# Patient Record
Sex: Female | Born: 1980 | Race: White | Hispanic: No | Marital: Single | Smoking: Former smoker
Health system: Southern US, Community
[De-identification: ages and names within clinical notes are randomized; demographics above are authoritative.]

## PROBLEM LIST (undated history)

## (undated) DIAGNOSIS — K219 Gastro-esophageal reflux disease without esophagitis: Secondary | ICD-10-CM

## (undated) DIAGNOSIS — E1010 Type 1 diabetes mellitus with ketoacidosis without coma: Secondary | ICD-10-CM

## (undated) DIAGNOSIS — E119 Type 2 diabetes mellitus without complications: Secondary | ICD-10-CM

## (undated) DIAGNOSIS — N179 Acute kidney failure, unspecified: Secondary | ICD-10-CM

## (undated) DIAGNOSIS — J9601 Acute respiratory failure with hypoxia: Secondary | ICD-10-CM

## (undated) DIAGNOSIS — F419 Anxiety disorder, unspecified: Secondary | ICD-10-CM

## (undated) DIAGNOSIS — I219 Acute myocardial infarction, unspecified: Secondary | ICD-10-CM

## (undated) DIAGNOSIS — E8729 Other acidosis: Secondary | ICD-10-CM

## (undated) DIAGNOSIS — K148 Other diseases of tongue: Secondary | ICD-10-CM

## (undated) DIAGNOSIS — E875 Hyperkalemia: Secondary | ICD-10-CM

## (undated) DIAGNOSIS — G43909 Migraine, unspecified, not intractable, without status migrainosus: Secondary | ICD-10-CM

## (undated) DIAGNOSIS — Z91199 Patient's noncompliance with other medical treatment and regimen due to unspecified reason: Secondary | ICD-10-CM

## (undated) DIAGNOSIS — R51 Headache: Secondary | ICD-10-CM

## (undated) DIAGNOSIS — F32A Depression, unspecified: Secondary | ICD-10-CM

## (undated) DIAGNOSIS — E860 Dehydration: Secondary | ICD-10-CM

## (undated) DIAGNOSIS — R000 Tachycardia, unspecified: Secondary | ICD-10-CM

## (undated) DIAGNOSIS — G4700 Insomnia, unspecified: Secondary | ICD-10-CM

## (undated) DIAGNOSIS — E440 Moderate protein-calorie malnutrition: Secondary | ICD-10-CM

## (undated) DIAGNOSIS — I959 Hypotension, unspecified: Secondary | ICD-10-CM

## (undated) HISTORY — PX: NO PAST SURGERIES: SHX2092

---

## 2002-01-03 ENCOUNTER — Encounter: Payer: Self-pay | Admitting: Emergency Medicine

## 2002-01-03 ENCOUNTER — Emergency Department (HOSPITAL_COMMUNITY): Admission: EM | Admit: 2002-01-03 | Discharge: 2002-01-03 | Payer: Self-pay | Admitting: Emergency Medicine

## 2003-01-01 ENCOUNTER — Emergency Department (HOSPITAL_COMMUNITY): Admission: EM | Admit: 2003-01-01 | Discharge: 2003-01-01 | Payer: Self-pay | Admitting: Emergency Medicine

## 2003-01-24 ENCOUNTER — Emergency Department (HOSPITAL_COMMUNITY): Admission: EM | Admit: 2003-01-24 | Discharge: 2003-01-24 | Payer: Self-pay | Admitting: Emergency Medicine

## 2003-01-25 ENCOUNTER — Encounter: Payer: Self-pay | Admitting: Emergency Medicine

## 2003-05-01 ENCOUNTER — Ambulatory Visit (HOSPITAL_COMMUNITY): Admission: RE | Admit: 2003-05-01 | Discharge: 2003-05-01 | Payer: Self-pay

## 2003-06-26 ENCOUNTER — Encounter: Payer: Self-pay | Admitting: Obstetrics and Gynecology

## 2003-06-26 ENCOUNTER — Ambulatory Visit (HOSPITAL_COMMUNITY): Admission: RE | Admit: 2003-06-26 | Discharge: 2003-06-26 | Payer: Self-pay | Admitting: Obstetrics and Gynecology

## 2003-07-24 ENCOUNTER — Inpatient Hospital Stay (HOSPITAL_COMMUNITY): Admission: AD | Admit: 2003-07-24 | Discharge: 2003-07-24 | Payer: Self-pay | Admitting: Obstetrics and Gynecology

## 2003-09-03 ENCOUNTER — Inpatient Hospital Stay (HOSPITAL_COMMUNITY): Admission: AD | Admit: 2003-09-03 | Discharge: 2003-09-03 | Payer: Self-pay | Admitting: Obstetrics and Gynecology

## 2003-09-11 ENCOUNTER — Inpatient Hospital Stay (HOSPITAL_COMMUNITY): Admission: AD | Admit: 2003-09-11 | Discharge: 2003-09-11 | Payer: Self-pay | Admitting: Obstetrics and Gynecology

## 2003-09-27 ENCOUNTER — Inpatient Hospital Stay (HOSPITAL_COMMUNITY): Admission: AD | Admit: 2003-09-27 | Discharge: 2003-09-29 | Payer: Self-pay | Admitting: Obstetrics and Gynecology

## 2011-08-24 ENCOUNTER — Emergency Department (HOSPITAL_COMMUNITY)
Admission: EM | Admit: 2011-08-24 | Discharge: 2011-08-25 | Disposition: A | Discharge status: Home / Self Care | Payer: Self-pay | Attending: Emergency Medicine | Admitting: Emergency Medicine

## 2011-08-24 ENCOUNTER — Emergency Department (HOSPITAL_COMMUNITY): Payer: Self-pay

## 2011-08-24 ENCOUNTER — Encounter: Payer: Self-pay | Admitting: Emergency Medicine

## 2011-08-24 DIAGNOSIS — S139XXA Sprain of joints and ligaments of unspecified parts of neck, initial encounter: Secondary | ICD-10-CM | POA: Insufficient documentation

## 2011-08-24 DIAGNOSIS — S161XXA Strain of muscle, fascia and tendon at neck level, initial encounter: Secondary | ICD-10-CM

## 2011-08-24 DIAGNOSIS — R51 Headache: Secondary | ICD-10-CM | POA: Insufficient documentation

## 2011-08-24 DIAGNOSIS — M542 Cervicalgia: Secondary | ICD-10-CM | POA: Insufficient documentation

## 2011-08-24 DIAGNOSIS — F17200 Nicotine dependence, unspecified, uncomplicated: Secondary | ICD-10-CM | POA: Insufficient documentation

## 2011-08-24 MED ORDER — HYDROCODONE-ACETAMINOPHEN 5-325 MG PO TABS: 1.0000 | ORAL_TABLET | ORAL | Status: AC | PRN

## 2011-08-24 MED ORDER — IBUPROFEN 800 MG PO TABS
800.0000 mg | ORAL_TABLET | Freq: Once | ORAL | Status: AC
  Administered 2011-08-24: 800 mg via ORAL
  Filled 2011-08-24: qty 1

## 2011-08-24 MED ORDER — IBUPROFEN 400 MG PO TABS: 400.0000 mg | ORAL_TABLET | Freq: Four times a day (QID) | ORAL | Status: AC | PRN

## 2011-08-24 MED ORDER — DIAZEPAM 5 MG PO TABS
5.0000 mg | ORAL_TABLET | Freq: Once | ORAL | Status: AC
  Administered 2011-08-24: 5 mg via ORAL
  Filled 2011-08-24: qty 1

## 2011-08-24 MED ORDER — CYCLOBENZAPRINE HCL 10 MG PO TABS: 10.0000 mg | ORAL_TABLET | Freq: Three times a day (TID) | ORAL | Status: AC | PRN

## 2011-08-24 MED ORDER — HYDROCODONE-ACETAMINOPHEN 5-325 MG PO TABS
1.0000 | ORAL_TABLET | Freq: Once | ORAL | Status: AC
  Administered 2011-08-24: 1 via ORAL
  Filled 2011-08-24: qty 1
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
## 2011-08-24 NOTE — ED Notes (Signed)
Family at bedside. 

## 2011-08-24 NOTE — ED Notes (Signed)
Patient is resting comfortably. 

## 2011-08-25 NOTE — ED Provider Notes (Signed)
Medical screening examination/treatment/procedure(s) were performed by non-physician practitioner and as supervising physician I was immediately available for consultation/collaboration.  Shelda Jakes, MD 08/25/11 [phone]

## 2011-11-22 ENCOUNTER — Emergency Department (HOSPITAL_COMMUNITY)
Admission: EM | Admit: 2011-11-22 | Discharge: 2011-11-22 | Disposition: A | Discharge status: Home / Self Care | Payer: Self-pay | Attending: Emergency Medicine | Admitting: Emergency Medicine

## 2011-11-22 ENCOUNTER — Emergency Department (HOSPITAL_COMMUNITY): Payer: Self-pay

## 2011-11-22 ENCOUNTER — Encounter (HOSPITAL_COMMUNITY): Payer: Self-pay | Admitting: Emergency Medicine

## 2011-11-22 ENCOUNTER — Other Ambulatory Visit: Payer: Self-pay

## 2011-11-22 DIAGNOSIS — R509 Fever, unspecified: Secondary | ICD-10-CM | POA: Insufficient documentation

## 2011-11-22 DIAGNOSIS — R059 Cough, unspecified: Secondary | ICD-10-CM | POA: Insufficient documentation

## 2011-11-22 DIAGNOSIS — R0989 Other specified symptoms and signs involving the circulatory and respiratory systems: Secondary | ICD-10-CM | POA: Insufficient documentation

## 2011-11-22 DIAGNOSIS — L989 Disorder of the skin and subcutaneous tissue, unspecified: Secondary | ICD-10-CM | POA: Insufficient documentation

## 2011-11-22 DIAGNOSIS — R0602 Shortness of breath: Secondary | ICD-10-CM | POA: Insufficient documentation

## 2011-11-22 DIAGNOSIS — J189 Pneumonia, unspecified organism: Secondary | ICD-10-CM

## 2011-11-22 DIAGNOSIS — J45909 Unspecified asthma, uncomplicated: Secondary | ICD-10-CM | POA: Insufficient documentation

## 2011-11-22 DIAGNOSIS — IMO0001 Reserved for inherently not codable concepts without codable children: Secondary | ICD-10-CM | POA: Insufficient documentation

## 2011-11-22 DIAGNOSIS — R05 Cough: Secondary | ICD-10-CM | POA: Insufficient documentation

## 2011-11-22 DIAGNOSIS — M549 Dorsalgia, unspecified: Secondary | ICD-10-CM | POA: Insufficient documentation

## 2011-11-22 MED ORDER — DOXYCYCLINE HYCLATE 100 MG PO CAPS: 100.0000 mg | ORAL_CAPSULE | Freq: Two times a day (BID) | ORAL | Status: AC

## 2011-11-22 MED ORDER — HYDROCODONE-HOMATROPINE 5-1.5 MG/5ML PO SYRP: 5.0000 mL | ORAL_SOLUTION | Freq: Four times a day (QID) | ORAL | Status: AC | PRN

## 2011-11-22 NOTE — Discharge Instructions (Signed)
Take medications as prescribed. Followup with your doctor in regards to your hospital visit. If you do not have a doctor use the resource guide listed below to help you find one. You may return to the emergency department if symptoms worsen, become progressive, or become more concerning.  Pneumonia, Adult Pneumonia is an infection of the lungs.  CAUSES Pneumonia may be caused by bacteria or a virus. Usually, these infections are caused by breathing infectious particles into the lungs (respiratory tract). SYMPTOMS   Cough.   Fever.   Chest pain.   Increased rate of breathing.   Wheezing.   Mucus production.  DIAGNOSIS  If you have the common symptoms of pneumonia, your caregiver will typically confirm the diagnosis with a chest X-ray. The X-ray will show an abnormality in the lung (pulmonary infiltrate) if you have pneumonia. Other tests of your blood, urine, or sputum may be done to find the specific cause of your pneumonia. Your caregiver may also do tests (blood gases or pulse oximetry) to see how well your lungs are working. TREATMENT  Some forms of pneumonia may be spread to other people when you cough or sneeze. You may be asked to wear a mask before and during your exam. Pneumonia that is caused by bacteria is treated with antibiotic medicine. Pneumonia that is caused by the influenza virus may be treated with an antiviral medicine. Most other viral infections must run their course. These infections will not respond to antibiotics.  PREVENTION A pneumococcal shot (vaccine) is available to prevent a common bacterial cause of pneumonia. This is usually suggested for:  People over 74 years old.   Patients on chemotherapy.   People with chronic lung problems, such as bronchitis or emphysema.   People with immune system problems.  If you are over 65 or have a high risk condition, you may receive the pneumococcal vaccine if you have not received it before. In some countries, a  routine influenza vaccine is also recommended. This vaccine can help prevent some cases of pneumonia.You may be offered the influenza vaccine as part of your care. If you smoke, it is time to quit. You may receive instructions on how to stop smoking. Your caregiver can provide medicines and counseling to help you quit. HOME CARE INSTRUCTIONS   Cough suppressants may be used if you are losing too much rest. However, coughing protects you by clearing your lungs. You should avoid using cough suppressants if you can.   Your caregiver may have prescribed medicine if he or she thinks your pneumonia is caused by a bacteria or influenza. Finish your medicine even if you start to feel better.   Your caregiver may also prescribe an expectorant. This loosens the mucus to be coughed up.   Only take over-the-counter or prescription medicines for pain, discomfort, or fever as directed by your caregiver.   Do not smoke. Smoking is a common cause of bronchitis and can contribute to pneumonia. If you are a smoker and continue to smoke, your cough may last several weeks after your pneumonia has cleared.   A cold steam vaporizer or humidifier in your room or home may help loosen mucus.   Coughing is often worse at night. Sleeping in a semi-upright position in a recliner or using a couple pillows under your head will help with this.   Get rest as you feel it is needed. Your body will usually let you know when you need to rest.  SEEK IMMEDIATE MEDICAL CARE IF:  Your illness becomes worse. This is especially true if you are elderly or weakened from any other disease.   You cannot control your cough with suppressants and are losing sleep.   You begin coughing up blood.   You develop pain which is getting worse or is uncontrolled with medicines.   You have a fever.   Any of the symptoms which initially brought you in for treatment are getting worse rather than better.   You develop shortness of breath or  chest pain.  MAKE SURE YOU:   Understand these instructions.   Will watch your condition.   Will get help right away if you are not doing well or get worse.   RESOURCE GUIDE  Dental Problems  Patients with Medicaid: [facility] [phone] [address]                                           [phone] [address] Phone:  [phone]                                                  Phone:  [phone]  If unable to pay or uninsured, contact:  [facility] or [facility]. to become qualified for the adult dental clinic.  Chronic Pain Problems Contact [facility]  [phone] Patients need to be referred by their primary care doctor.  Insufficient Money for Medicine Contact United Way:  call "211" or [facility] 440-567-5947.  No Primary Care Doctor Call [facility]  [phone] Other agencies that provide inexpensive medical care    [facility]  [phone]    [facility]  [phone]    [facility]  [phone]    [facility]  [phone]    [facility]  [phone]    [facility]  [phone]  Psychological Services [facility]  [phone] [facility]  [phone] [facility]   [phone] (emergency services 6475508053)  Substance Abuse Resources [facility]  [phone] [facility] [phone])025-7274 [facility] [phone] Floydene Flock [phone] Residential & Outpatient Substance Abuse Program  ([phone]  Abuse/Neglect [facility] Child Abuse Hotline [phone] [facility] Child Abuse Hotline 7754354798 (After Hours)  Emergency Shelter [facility] ([phone]  Maternity Homes Room at the [place] [facility] [place] (225)[phone] [facility] [phone]  MRSA Hotline #:   203 394 3385    [facility] Resources  Free  [facility] [place]     United [employer]                          [facility] Dept. [address] [place]                       [address]      [address]  [address]  Sela Hua Phone:  [phone]                                   Phone:  [phone]                 Phone:  [place] Phone:  [place] [phone] (After Hours)

## 2011-11-22 NOTE — ED Provider Notes (Signed)
History     CSN: [id number]  Arrival date & time 11/22/11  1932   First MD Initiated Contact with Patient 11/22/11 2143      Chief Complaint  Patient presents with  . Cough    (Consider location/radiation/quality/duration/timing/severity/associated sxs/prior treatment) HPI Comments: Patient with a history of asthma presents emergency department chief complaint of productive cough, body aches, fever, chest congestion, and back pain.  Patient states she's had these symptoms for 3 weeks.  Patient has some mild shortness of breath after long coughing spells.  Patient states that she currently does not use albuterol and has not had an asthma exacerbation since childhood.  Patient denies wheezing, dyspnea on exertion, chest pain, ear aches, change in vision. NO hx of IV drug use, CA, Steroids, DM , or COPD.   Patient is a 31 y.o. female presenting with cough. The history is provided by the patient.  Cough Associated symptoms include chills, sweats, myalgias and shortness of breath. Pertinent negatives include no chest pain, no weight loss, no ear congestion, no ear pain, no headaches, no rhinorrhea, no sore throat, no wheezing and no eye redness.    Past Medical History  Diagnosis Date  . Asthma     History reviewed. No pertinent past surgical history.  History reviewed. No pertinent family history.  History  Substance Use Topics  . Smoking status: Current Everyday Smoker  . Smokeless tobacco: Not on file  . Alcohol Use: No    OB History    Grav Para Term Preterm Abortions TAB SAB Ect Mult Living                  Review of Systems  Constitutional: Positive for fever and chills. Negative for weight loss.  HENT: Negative for ear pain, congestion, sore throat and rhinorrhea.   Eyes: Negative for redness.  Respiratory: Positive for cough and shortness of breath. Negative for wheezing.   Cardiovascular: Negative for chest pain.  Genitourinary: Negative for dysuria, urgency,  frequency and flank pain.  Musculoskeletal: Positive for myalgias and back pain.  Skin: Negative for color change and rash.  Neurological: Negative for speech difficulty, weakness and headaches.  All other systems reviewed and are negative.    Allergies  Review of patient's allergies indicates no known allergies.  Home Medications   Current Outpatient Rx  Name Route Sig Dispense Refill  . ACETAMINOPHEN 500 MG PO TABS Oral Take 500 mg by mouth every 6 (six) hours as needed. For pain     . IBUPROFEN 200 MG PO TABS Oral Take 400 mg by mouth every 6 (six) hours as needed. For pain     . PSEUDOEPH-DOXYLAMINE-DM-APAP 60-12.02-18-999 MG/30ML PO LIQD Oral Take 30 mLs by mouth every 12 (twelve) hours as needed. For cold/flu symptoms    . PSEUDOEPHEDRINE-APAP-DM 30-865-78 MG/30ML PO LIQD Oral Take 15 mLs by mouth every 12 (twelve) hours as needed. For cold/flu symptoms      BP 117/58  Temp(Src) 99.6 F (37.6 C) (Oral)  Resp 20  SpO2 92%  LMP 11/08/2011  Physical Exam  Nursing note and vitals reviewed. Constitutional: She is oriented to person, place, and time. She appears well-developed and well-nourished. No distress.  HENT:  Head: Normocephalic and atraumatic. No trismus in the jaw.  Right Ear: External ear normal. No drainage or tenderness. No mastoid tenderness.  Left Ear: External ear normal. No drainage or tenderness. No mastoid tenderness.  Nose: Nose normal. No rhinorrhea or sinus tenderness.  Mouth/Throat: Uvula is midline,  oropharynx is clear and moist and mucous membranes are normal. No uvula swelling. No oropharyngeal exudate.  Eyes: Conjunctivae and EOM are normal. Right eye exhibits no discharge. Left eye exhibits no discharge. No scleral icterus.  Neck: Normal range of motion. Neck supple.  Cardiovascular: Normal rate, regular rhythm and normal heart sounds.   Pulmonary/Chest: Effort normal. No stridor. No respiratory distress. She has no wheezes. She has rhonchi in the  right middle field, the right lower field and the left lower field. She exhibits tenderness.         Mild tenderness of breast.  No fluctuance, erythema, or draining.  Abdominal: Soft. There is no tenderness.  Musculoskeletal: Normal range of motion.  Neurological: She is alert and oriented to person, place, and time.  Skin: Skin is warm and dry. No rash noted. She is not diaphoretic.       3 small scabs located on the patient's lower back over her tattoo.  There is no surrounding erythema or draining.  Psychiatric: She has a normal mood and affect. Her behavior is normal.    ED Course  Procedures (including critical care time)  Labs Reviewed - No data to display Dg Chest 2 View  11/22/2011  *RADIOLOGY REPORT*  Clinical Data: Productive cough  CHEST - 2 VIEW  Comparison: None.  Findings: Diffusely increased lung markings.  This is most prominent in the lung bases.  No baseline study is available however the findings are suggestive of bibasilar pneumonia.  There may be underlying chronic lung disease from asthma.  No pleural effusion.  Mild hyperinflation.  IMPRESSION: Prominent lung markings with bibasilar infiltrates compatible with pneumonia.  Question underlying asthma.  Original Report Authenticated By: Camelia Phenes, M.D.     No diagnosis found.    MDM  CAP  Patient diagnosed with community-acquired pneumonia.  No significant comorbidities or immunocompromise.  Patient will be discharged and treated w OP abx. Advised to have a f-u CXR after tx to assure the PNA was treated appropriately.  Patient has been tries to use warm compresses on breast and to return for incision and drainage if abscess forms.  Patient also advised to return to emergency department if fever greater than 101 develops and persists or if she becomes short of breath.      Jaci Carrel, [place] 11/22/11 2239

## 2011-11-22 NOTE — ED Notes (Addendum)
Patient complaining of productive cough, body aches, fever, nausea, chest congestion, vomiting, back pain, and sore rib pain (during coughing) for the past three weeks.  Patient denies shortness of breath and chest pain.

## 2011-11-22 NOTE — ED Notes (Signed)
Pt states productive coughing for 3 weeks. Pt states that she has been having fever chills, sweats as well. Pt denies pain at this time.

## 2011-11-23 NOTE — ED Provider Notes (Signed)
Medical screening examination/treatment/procedure(s) were performed by non-physician practitioner and as supervising physician I was immediately available for consultation/collaboration.  Raeford Razor, MD 11/23/11 [phone]

## 2012-06-17 ENCOUNTER — Encounter (HOSPITAL_COMMUNITY): Payer: Self-pay | Admitting: Emergency Medicine

## 2012-06-17 ENCOUNTER — Inpatient Hospital Stay (HOSPITAL_COMMUNITY)
Admission: EM | Admit: 2012-06-17 | Discharge: 2012-06-19 | DRG: 639 | Disposition: A | Discharge status: Home / Self Care | Payer: MEDICAID | Attending: Family Medicine | Admitting: Family Medicine

## 2012-06-17 DIAGNOSIS — K219 Gastro-esophageal reflux disease without esophagitis: Secondary | ICD-10-CM

## 2012-06-17 DIAGNOSIS — E1110 Type 2 diabetes mellitus with ketoacidosis without coma: Secondary | ICD-10-CM

## 2012-06-17 DIAGNOSIS — F17200 Nicotine dependence, unspecified, uncomplicated: Secondary | ICD-10-CM | POA: Diagnosis present

## 2012-06-17 DIAGNOSIS — E1065 Type 1 diabetes mellitus with hyperglycemia: Secondary | ICD-10-CM

## 2012-06-17 DIAGNOSIS — Z833 Family history of diabetes mellitus: Secondary | ICD-10-CM

## 2012-06-17 DIAGNOSIS — E1010 Type 1 diabetes mellitus with ketoacidosis without coma: Principal | ICD-10-CM | POA: Diagnosis present

## 2012-06-17 LAB — COMPREHENSIVE METABOLIC PANEL
Albumin: 3.9 g/dL (ref 3.5–5.2)
Alkaline Phosphatase: 126 U/L — ABNORMAL HIGH (ref 39–117)
BUN: 9 mg/dL (ref 6–23)
CO2: 12 mEq/L — ABNORMAL LOW (ref 19–32)
Chloride: 86 mEq/L — ABNORMAL LOW (ref 96–112)
Creatinine, Ser: 0.92 mg/dL (ref 0.50–1.10)
GFR calc Af Amer: >90 mL/min (ref >90)
GFR calc non Af Amer: 83 mL/min — ABNORMAL LOW (ref >90)
Glucose, Bld: 742 mg/dL (ref 70–99)
Potassium: 3.7 mEq/L (ref 3.5–5.1)
Total Bilirubin: 0.6 mg/dL (ref 0.3–1.2)

## 2012-06-17 LAB — URINALYSIS, ROUTINE W REFLEX MICROSCOPIC
Glucose, UA: >1000 mg/dL — AB
Ketones, ur: >80 mg/dL — AB
Leukocytes, UA: NEGATIVE
Protein, ur: NEGATIVE mg/dL
Urobilinogen, UA: 0.2 mg/dL (ref 0.0–1.0)

## 2012-06-17 LAB — CBC
HCT: 45.4 % (ref 36.0–46.0)
Hemoglobin: 15.9 g/dL — ABNORMAL HIGH (ref 12.0–15.0)
MCV: 94.6 fL (ref 78.0–100.0)
WBC: 7 10*3/uL (ref 4.0–10.5)

## 2012-06-17 LAB — URINE MICROSCOPIC-ADD ON

## 2012-06-17 LAB — GLUCOSE, CAPILLARY

## 2012-06-17 MED ORDER — SODIUM CHLORIDE 0.9 % IV SOLN
INTRAVENOUS | Status: AC
  Administered 2012-06-18: 1.1 [IU]/h via INTRAVENOUS
  Administered 2012-06-18: 5.4 [IU]/h via INTRAVENOUS
  Administered 2012-06-18: 4.1 [IU]/h via INTRAVENOUS
  Administered 2012-06-18: 4.9 [IU]/h via INTRAVENOUS
  Filled 2012-06-17: qty 1

## 2012-06-17 MED ORDER — ACETAMINOPHEN 325 MG PO TABS: 650.0000 mg | ORAL_TABLET | Freq: Four times a day (QID) | ORAL | Status: DC | PRN

## 2012-06-17 MED ORDER — ACETAMINOPHEN 650 MG RE SUPP: 650.0000 mg | Freq: Four times a day (QID) | RECTAL | Status: DC | PRN

## 2012-06-17 MED ORDER — DEXTROSE 50 % IV SOLN: 25.0000 mL | INTRAVENOUS | Status: DC | PRN

## 2012-06-17 MED ORDER — SODIUM CHLORIDE 0.9 % IV SOLN
INTRAVENOUS | Status: DC
  Administered 2012-06-18: 3.1 [IU]/h via INTRAVENOUS
  Filled 2012-06-17: qty 1

## 2012-06-17 MED ORDER — HEPARIN SODIUM (PORCINE) 5000 UNIT/ML IJ SOLN: 5000.0000 [IU] | Freq: Three times a day (TID) | INTRAMUSCULAR | Status: DC

## 2012-06-17 MED ORDER — DEXTROSE-NACL 5-0.45 % IV SOLN
INTRAVENOUS | Status: DC
  Administered 2012-06-18 (×2): via INTRAVENOUS

## 2012-06-17 MED ORDER — HEPARIN SODIUM (PORCINE) 5000 UNIT/ML IJ SOLN
5000.0000 [IU] | Freq: Three times a day (TID) | INTRAMUSCULAR | Status: DC
  Administered 2012-06-18 – 2012-06-19 (×4): 5000 [IU] via SUBCUTANEOUS
  Filled 2012-06-17 (×7): qty 1

## 2012-06-17 MED ORDER — SODIUM CHLORIDE 0.9 % IV BOLUS (SEPSIS)
1000.0000 mL | Freq: Once | INTRAVENOUS | Status: AC
  Administered 2012-06-17: 1000 mL via INTRAVENOUS

## 2012-06-17 MED ORDER — SODIUM CHLORIDE 0.9 % IV SOLN
INTRAVENOUS | Status: DC
  Administered 2012-06-18: 02:00:00 via INTRAVENOUS

## 2012-06-17 MED ORDER — POTASSIUM CHLORIDE 10 MEQ/100ML IV SOLN
10.0000 meq | INTRAVENOUS | Status: AC
  Administered 2012-06-18: 10 meq via INTRAVENOUS
  Filled 2012-06-17: qty 100

## 2012-06-17 MED ORDER — DOCUSATE SODIUM 100 MG PO CAPS: 100.0000 mg | ORAL_CAPSULE | Freq: Two times a day (BID) | ORAL | Status: DC

## 2012-06-17 MED ORDER — ONDANSETRON HCL 4 MG PO TABS: 4.0000 mg | ORAL_TABLET | Freq: Four times a day (QID) | ORAL | Status: DC | PRN

## 2012-06-17 MED ORDER — DOCUSATE SODIUM 100 MG PO CAPS
100.0000 mg | ORAL_CAPSULE | Freq: Two times a day (BID) | ORAL | Status: DC
  Administered 2012-06-18 – 2012-06-19 (×2): 100 mg via ORAL
  Filled 2012-06-17 (×4): qty 1

## 2012-06-17 MED ORDER — ONDANSETRON HCL 4 MG/2ML IJ SOLN
4.0000 mg | Freq: Four times a day (QID) | INTRAMUSCULAR | Status: DC | PRN
  Administered 2012-06-18: 4 mg via INTRAVENOUS
  Filled 2012-06-17: qty 2

## 2012-06-17 MED ORDER — ZOLPIDEM TARTRATE 5 MG PO TABS: 5.0000 mg | ORAL_TABLET | Freq: Every evening | ORAL | Status: DC | PRN

## 2012-06-17 NOTE — ED Notes (Signed)
2nd liter infused  3rd lioter to run 227ml/hr

## 2012-06-17 NOTE — ED Notes (Addendum)
Pt reports for 3 months having polyuria, polydispia; has lost a lot of weight recently as well; having dysuria; no hx of DM

## 2012-06-17 NOTE — ED Notes (Signed)
1000cc nss added to saline lok wide open

## 2012-06-17 NOTE — ED Notes (Signed)
Admitting doctor here to see 

## 2012-06-17 NOTE — H&P (Signed)
Family Medicine Teaching [facility] Admission History and Physical Service Pager: [phone]  Patient name: Kristine Allison Medical record number: [id number] Date of birth: 1980/09/23 Age: 30 y.o. Gender: female  Primary Care Provider: Per Patient No Pcp  Chief Complaint: DKA  History of Present Illness: Kristine Allison is a thin 31 y.o. year old female  with a history of asthma presenting with hyperglycemia of 742, corrected sodium of 134.3,  with anion gap of 36.3 and abdominal pain. No previous history of Diabetes, positive family history (unsure type 1 vs type 2). She presented to the ED tonight because her family urged her to come in, stating she looked ill. She endorses decreased energy and excessive thirst for about 5 months. She is frequently nauseated, always cold and has experienced an unintentional weight loss of 50 pounds in less than 5 months. She complains of suprapubic pain currently, that is sharp/ stabbing in nature that will come on all of a sudden and then resolve. She has no other complaints, other than she is wanting a cigarette and really does not want to be in the hospital.   She admits to smoking a 1/3 of ppd of cigarettes, but does not want a nicotine patch while she is her because they "do not work.".  Admits to social EtOH and no drug use. 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 She does not have insurance or a PCP.   Patient Active Problem List  Diagnosis  . DKA (diabetic ketoacidoses)  . GERD (gastroesophageal reflux disease)   Past Medical History: Past Medical History  Diagnosis Date  . Asthma    Past Surgical History: History reviewed. No pertinent past surgical history. Social History: History  Substance Use Topics  . Smoking status: Current Every Day Smoker -- 0.3 packs/day    Types: Cigarettes  . Smokeless tobacco: Not on file  . Alcohol Use: No   For any additional social  history documentation, please refer to relevant sections of EMR.  Family History: History reviewed. No pertinent family history. Allergies: No Known Allergies No current facility-administered medications on file prior to encounter.   Current Outpatient Prescriptions on File Prior to Encounter  Medication Sig Dispense Refill  . acetaminophen (TYLENOL) 500 MG tablet Take 500 mg by mouth every 6 (six) hours as needed. For pain       . ibuprofen (ADVIL,MOTRIN) 200 MG tablet Take 400 mg by mouth every 6 (six) hours as needed. For pain        Review Of Systems: Per HPI  Otherwise 12 point review of systems was performed and was unremarkable.  Physical Exam: BP 107/74  Pulse 104  Temp 98.2 F (36.8 C) (Oral)  Resp 18  SpO2 100%  LMP 06/12/2012 Exam: General: NAD. Alert. Oriented. Texting on cell phone. Smell of ketosis on breath.  HEENT: AT. [place]. PERLA. EOMi. ENT: WNL. Hair is thin. No LAD. Cardiovascular: RRR. S1S2. No murmur, gallops or rubs. Respiratory: CTAB. No wheezing, rhonchi or rales. Abdomen: Soft, flat. Mildly tender RUQ, otherwise NT. ND. Stretch marks/loose skin. No HSM. BS+ Extremities: WNL ROM. Pulses strong bilateral UE/LE.  Skin: No rashes, ulcerations or skin breakdown.  Neuro: Grossly intact CN II- XII. No focal deficits.   Labs and Imaging: CBC BMET   Lab 06/18/12 0002  WBC 8.9  HGB 14.1  HCT 40.0  PLT 191    Lab 06/18/12 0002  NA 134*  K 3.5  CL 100  CO2 13*  BUN 8  CREATININE 0.67  GLUCOSE 392*  CALCIUM 7.9*     Urinalysis    Component Value Date/Time   COLORURINE YELLOW 06/17/2012 2123   APPEARANCEUR CLOUDY* 06/17/2012 2123   LABSPEC 1.010 06/17/2012 2123   PHURINE 5.0 06/17/2012 2123   GLUCOSEU >1000* 06/17/2012 2123   HGBUR LARGE* 06/17/2012 2123   BILIRUBINUR NEGATIVE 06/17/2012 2123   KETONESUR >80* 06/17/2012 2123   PROTEINUR NEGATIVE 06/17/2012 2123   UROBILINOGEN 0.2 06/17/2012 2123   NITRITE NEGATIVE 06/17/2012 2123   LEUKOCYTESUR  NEGATIVE 06/17/2012 2123    Pending: TSH, HgBA1c   Assessment and Plan: Kristine Allison is a 31 y.o. year old female with a history of asthma presenting with hyperglycemia of 742, corrected sodium of 134.3,  with anion gap of 36.3 and abdominal pain. No previous history of Diabetes, positive family history.   1. 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  - if K+ level <3.5 --> Give 4 runs of KCl 10 mEq per 100 mL NS each over 1 hour  2. Abdominal pain: - Consider toradol 30 mg q 6hr PRN if kidney function is normal. - UA with hemoglobin and RBCs, will recheck UA and consider sending for culture if still bacteria/WBCs  3. GERD: new reflux-like symptoms - Start Protonix 20 mg  4. Social: Patient has no PCP or insurance - [place] card information - Social work consult  - Consider case management for meds  5. FEN/GI: - 2L bolus of NS Now; then 150 ml/hr NS until CBG <250 - CBG <250 --> D5 1/2 NS @ 150 ml/hr   - NPO with ice chips  Code Status: Full Prophylaxis: Heparin 5000 units, TID Disposition: Pending glycemic control and diabetic education.   Felix Pacini, DO 06/18/2012, 1:29 AM   UPPER LEVEL ADDENDUM  I have seen and examined Kristine Allison with Dr. Claiborne Billings and I agree with the above assessment/plan. I have reviewed all available data and have made any necessary changes to the above H&P.   Kristine Allison 06/17/2012, 10:50PM

## 2012-06-17 NOTE — ED Provider Notes (Signed)
History     CSN: [id number]  Arrival date & time 06/17/12  2015   First MD Initiated Contact with Patient 06/17/12 2206      Chief Complaint  Patient presents with  . Hyperglycemia    (Consider location/radiation/quality/duration/timing/severity/associated sxs/prior treatment) HPI Pt with 3 months of weight loss, polydipsia, polyuria and generalized weakness. No fever chills. +episodic abd cramps. No known history of DM.   Past Medical History  Diagnosis Date  . Asthma     History reviewed. No pertinent past surgical history.  History reviewed. No pertinent family history.  History  Substance Use Topics  . Smoking status: Current Every Day Smoker -- 0.3 packs/day    Types: Cigarettes  . Smokeless tobacco: Not on file  . Alcohol Use: No    OB History    Grav Para Term Preterm Abortions TAB SAB Ect Mult Living                  Review of Systems  Constitutional: Positive for fatigue and unexpected weight change. Negative for fever and chills.  Respiratory: Negative for shortness of breath.   Cardiovascular: Negative for chest pain.  Gastrointestinal: Positive for abdominal pain. Negative for nausea, vomiting, diarrhea and abdominal distention.  Genitourinary: Positive for frequency. Negative for dysuria and flank pain.  Musculoskeletal: Negative for myalgias and back pain.  Skin: Negative for rash and wound.  Neurological: Negative for dizziness, light-headedness, numbness and headaches.    Allergies  Review of patient's allergies indicates no known allergies.  Home Medications   No current outpatient prescriptions on file.  BP 100/50  Pulse 96  Temp 97.9 F (36.6 C) (Oral)  Resp 15  Ht 5\' 4"  (1.626 m)  Wt 118 lb 13.3 oz (53.9 kg)  BMI 20.40 kg/m2  SpO2 99%  LMP 06/12/2012  Physical Exam  Nursing note and vitals reviewed. Constitutional: She is oriented to person, place, and time. She appears well-developed and well-nourished. No distress.   Talking on phone  HENT:  Head: Normocephalic and atraumatic.  Mouth/Throat: Oropharynx is clear and moist.  Eyes: EOM are normal. Pupils are equal, round, and reactive to light.  Neck: Normal range of motion. Neck supple.  Cardiovascular: Normal rate and regular rhythm.   Pulmonary/Chest: Effort normal and breath sounds normal. No respiratory distress. She has no wheezes. She has no rales.  Abdominal: Soft. Bowel sounds are normal. She exhibits no distension and no mass. There is no tenderness. There is no rebound and no guarding.  Musculoskeletal: Normal range of motion. She exhibits no edema and no tenderness.  Neurological: She is alert and oriented to person, place, and time.       Moves all ext, sensation intact  Skin: Skin is warm and dry. No rash noted. No erythema.  Psychiatric: She has a normal mood and affect. Her behavior is normal.    ED Course  Procedures (including critical care time)  Labs Reviewed  CBC - Abnormal; Notable for the following:    Hemoglobin 15.9 (*)     All other components within normal limits  COMPREHENSIVE METABOLIC PANEL - Abnormal; Notable for the following:    Sodium 124 (*)     Chloride 86 (*)     CO2 12 (*)     Glucose, Bld 742 (*)     Alkaline Phosphatase 126 (*)     GFR calc non Af Amer 83 (*)     All other components within normal limits  URINALYSIS, ROUTINE W REFLEX  MICROSCOPIC - Abnormal; Notable for the following:    APPearance CLOUDY (*)     Glucose, UA >1000 (*)     Hgb urine dipstick LARGE (*)     Ketones, ur >80 (*)     All other components within normal limits  GLUCOSE, CAPILLARY - Abnormal; Notable for the following:    Glucose-Capillary >600 (*)     All other components within normal limits  URINE MICROSCOPIC-ADD ON - Abnormal; Notable for the following:    Bacteria, UA FEW (*)     All other components within normal limits  GLUCOSE, CAPILLARY - Abnormal; Notable for the following:    Glucose-Capillary 555 (*)     All  other components within normal limits  GLUCOSE, CAPILLARY - Abnormal; Notable for the following:    Glucose-Capillary 371 (*)     All other components within normal limits  BASIC METABOLIC PANEL - Abnormal; Notable for the following:    Sodium 134 (*)  DELTA CHECK NOTED   CO2 13 (*)     Glucose, Bld 392 (*)     Calcium 7.9 (*)     All other components within normal limits  BASIC METABOLIC PANEL - Abnormal; Notable for the following:    Potassium 3.1 (*)     CO2 16 (*)     Glucose, Bld 181 (*)     Calcium 8.3 (*)     All other components within normal limits  BASIC METABOLIC PANEL - Abnormal; Notable for the following:    Sodium 133 (*)     Potassium 2.8 (*)     CO2 16 (*)     Glucose, Bld 112 (*)     Calcium 8.3 (*)     All other components within normal limits  HEPATIC FUNCTION PANEL - Abnormal; Notable for the following:    Albumin 3.3 (*)     All other components within normal limits  GLUCOSE, CAPILLARY - Abnormal; Notable for the following:    Glucose-Capillary 384 (*)     All other components within normal limits  HEMOGLOBIN A1C - Abnormal; Notable for the following:    Hemoglobin A1C 14.6 (*)     Mean Plasma Glucose 372 (*)     All other components within normal limits  BASIC METABOLIC PANEL - Abnormal; Notable for the following:    Sodium 134 (*)     Potassium 2.7 (*)     CO2 16 (*)     Glucose, Bld 166 (*)     Calcium 8.2 (*)     All other components within normal limits  KETONES, URINE - Abnormal; Notable for the following:    Ketones, ur 15 (*)     All other components within normal limits  GLUCOSE, CAPILLARY - Abnormal; Notable for the following:    Glucose-Capillary 328 (*)     All other components within normal limits  GLUCOSE, CAPILLARY - Abnormal; Notable for the following:    Glucose-Capillary 298 (*)     All other components within normal limits  GLUCOSE, CAPILLARY - Abnormal; Notable for the following:    Glucose-Capillary 134 (*)     All other  components within normal limits  GLUCOSE, CAPILLARY - Abnormal; Notable for the following:    Glucose-Capillary 108 (*)     All other components within normal limits  GLUCOSE, CAPILLARY - Abnormal; Notable for the following:    Glucose-Capillary 174 (*)     All other components within normal limits  GLUCOSE, CAPILLARY -  Abnormal; Notable for the following:    Glucose-Capillary 193 (*)     All other components within normal limits  GLUCOSE, CAPILLARY - Abnormal; Notable for the following:    Glucose-Capillary 197 (*)     All other components within normal limits  BASIC METABOLIC PANEL - Abnormal; Notable for the following:    Potassium 3.1 (*)     CO2 16 (*)     BUN 5 (*)     Calcium 8.0 (*)     All other components within normal limits  GLUCOSE, CAPILLARY - Abnormal; Notable for the following:    Glucose-Capillary 182 (*)     All other components within normal limits  BASIC METABOLIC PANEL - Abnormal; Notable for the following:    Sodium 132 (*)     CO2 17 (*)     Glucose, Bld 354 (*)     BUN 5 (*)     All other components within normal limits  GLUCOSE, CAPILLARY - Abnormal; Notable for the following:    Glucose-Capillary 213 (*)     All other components within normal limits  GLUCOSE, CAPILLARY - Abnormal; Notable for the following:    Glucose-Capillary 377 (*)     All other components within normal limits  GLUCOSE, CAPILLARY - Abnormal; Notable for the following:    Glucose-Capillary 333 (*)     All other components within normal limits  CBC  TSH  MRSA PCR SCREENING  POCT PREGNANCY, URINE  GLUCOSE, CAPILLARY  GLUCOSE, CAPILLARY  URINE CULTURE  GC PROBE AMPLIFICATION, URINE  URINALYSIS, ROUTINE W REFLEX MICROSCOPIC   No results found.   No diagnosis found.    MDM  Discussed with family medicine resident who will see in ED        Loren Racer, MD 06/19/12 [phone]

## 2012-06-17 NOTE — ED Notes (Signed)
2nd liter of nss added to iv.  Pt up to the br

## 2012-06-17 NOTE — ED Notes (Signed)
Pt c/o abd pain  Wanting somehting to drink and smoke.  Alert oriented tearful skin warm and very dry

## 2012-06-18 LAB — HEPATIC FUNCTION PANEL
ALT: 7 U/L (ref 0–35)
AST: 11 U/L (ref 0–37)
Albumin: 3.3 g/dL — ABNORMAL LOW (ref 3.5–5.2)
Alkaline Phosphatase: 110 U/L (ref 39–117)
Total Bilirubin: 0.4 mg/dL (ref 0.3–1.2)

## 2012-06-18 LAB — GLUCOSE, CAPILLARY
Glucose-Capillary: 298 mg/dL — ABNORMAL HIGH (ref 70–99)
Glucose-Capillary: 108 mg/dL — ABNORMAL HIGH (ref 70–99)
Glucose-Capillary: 174 mg/dL — ABNORMAL HIGH (ref 70–99)
Glucose-Capillary: 193 mg/dL — ABNORMAL HIGH (ref 70–99)
Glucose-Capillary: 197 mg/dL — ABNORMAL HIGH (ref 70–99)
Glucose-Capillary: 74 mg/dL (ref 70–99)
Glucose-Capillary: 333 mg/dL — ABNORMAL HIGH (ref 70–99)

## 2012-06-18 LAB — HEMOGLOBIN A1C
Hgb A1c MFr Bld: 14.6 % — ABNORMAL HIGH (ref <5.7)
Mean Plasma Glucose: 372 mg/dL — ABNORMAL HIGH (ref <117)

## 2012-06-18 LAB — BASIC METABOLIC PANEL
BUN: 5 mg/dL — ABNORMAL LOW (ref 6–23)
BUN: 5 mg/dL — ABNORMAL LOW (ref 6–23)
BUN: 8 mg/dL (ref 6–23)
CO2: 16 mEq/L — ABNORMAL LOW (ref 19–32)
CO2: 16 mEq/L — ABNORMAL LOW (ref 19–32)
CO2: 16 mEq/L — ABNORMAL LOW (ref 19–32)
Calcium: 8.3 mg/dL — ABNORMAL LOW (ref 8.4–10.5)
Calcium: 8.5 mg/dL (ref 8.4–10.5)
Chloride: 100 mEq/L (ref 96–112)
Chloride: 104 mEq/L (ref 96–112)
Chloride: 105 mEq/L (ref 96–112)
Chloride: 105 mEq/L (ref 96–112)
Chloride: 109 mEq/L (ref 96–112)
Creatinine, Ser: 0.52 mg/dL (ref 0.50–1.10)
Creatinine, Ser: 0.53 mg/dL (ref 0.50–1.10)
Creatinine, Ser: 0.52 mg/dL (ref 0.50–1.10)
Creatinine, Ser: 0.66 mg/dL (ref 0.50–1.10)
GFR calc Af Amer: >90 mL/min (ref >90)
GFR calc Af Amer: >90 mL/min (ref >90)
GFR calc non Af Amer: >90 mL/min (ref >90)
GFR calc non Af Amer: >90 mL/min (ref >90)
Glucose, Bld: 112 mg/dL — ABNORMAL HIGH (ref 70–99)
Glucose, Bld: 354 mg/dL — ABNORMAL HIGH (ref 70–99)
Potassium: 2.7 mEq/L — CL (ref 3.5–5.1)
Potassium: 2.8 mEq/L — ABNORMAL LOW (ref 3.5–5.1)
Potassium: 3.1 mEq/L — ABNORMAL LOW (ref 3.5–5.1)
Potassium: 3.5 mEq/L (ref 3.5–5.1)
Sodium: 133 mEq/L — ABNORMAL LOW (ref 135–145)
Sodium: 132 mEq/L — ABNORMAL LOW (ref 135–145)

## 2012-06-18 LAB — CBC
HCT: 40 % (ref 36.0–46.0)
MCV: 93 fL (ref 78.0–100.0)
Platelets: 191 10*3/uL (ref 150–400)
RBC: 4.3 MIL/uL (ref 3.87–5.11)
WBC: 8.9 10*3/uL (ref 4.0–10.5)

## 2012-06-18 LAB — TSH: TSH: 2.994 u[IU]/mL (ref 0.350–4.500)

## 2012-06-18 LAB — MRSA PCR SCREENING: MRSA by PCR: NEGATIVE

## 2012-06-18 LAB — KETONES, URINE: Ketones, ur: 15 mg/dL — AB

## 2012-06-18 LAB — POCT PREGNANCY, URINE: Preg Test, Ur: NEGATIVE

## 2012-06-18 MED ORDER — INFLUENZA VIRUS VACC SPLIT PF IM SUSP
0.5000 mL | INTRAMUSCULAR | Status: AC
  Administered 2012-06-19: 0.5 mL via INTRAMUSCULAR
  Filled 2012-06-18: qty 0.5

## 2012-06-18 MED ORDER — INSULIN ASPART 100 UNIT/ML ~~LOC~~ SOLN
0.0000 [IU] | Freq: Every day | SUBCUTANEOUS | Status: DC
Start: 2012-06-18 — End: 2012-06-19
  Administered 2012-06-18: 4 [IU] via SUBCUTANEOUS

## 2012-06-18 MED ORDER — INSULIN ASPART 100 UNIT/ML ~~LOC~~ SOLN
0.0000 [IU] | Freq: Three times a day (TID) | SUBCUTANEOUS | Status: DC
  Administered 2012-06-18: 3 [IU] via SUBCUTANEOUS

## 2012-06-18 MED ORDER — POTASSIUM CHLORIDE IN NACL 20-0.9 MEQ/L-% IV SOLN
INTRAVENOUS | Status: DC
  Administered 2012-06-18: 125 mL/h via INTRAVENOUS
  Administered 2012-06-19: 1000 mL via INTRAVENOUS
  Filled 2012-06-18 (×4): qty 1000

## 2012-06-18 MED ORDER — PANTOPRAZOLE SODIUM 20 MG PO TBEC
20.0000 mg | DELAYED_RELEASE_TABLET | Freq: Every day | ORAL | Status: DC
  Administered 2012-06-18 – 2012-06-19 (×2): 20 mg via ORAL
  Filled 2012-06-18 (×2): qty 1

## 2012-06-18 MED ORDER — INSULIN ASPART 100 UNIT/ML ~~LOC~~ SOLN: 0.0000 [IU] | Freq: Three times a day (TID) | SUBCUTANEOUS | Status: DC

## 2012-06-18 MED ORDER — LIVING WELL WITH DIABETES BOOK
Freq: Once | Status: AC
  Administered 2012-06-18: 10:00:00
  Filled 2012-06-18: qty 1

## 2012-06-18 MED ORDER — KETOROLAC TROMETHAMINE 30 MG/ML IJ SOLN
30.0000 mg | Freq: Four times a day (QID) | INTRAMUSCULAR | Status: DC | PRN
  Administered 2012-06-18 (×3): 30 mg via INTRAVENOUS
  Filled 2012-06-18 (×3): qty 1

## 2012-06-18 MED ORDER — SODIUM CHLORIDE 0.9 % IV SOLN: INTRAVENOUS | Status: DC

## 2012-06-18 MED ORDER — MORPHINE SULFATE 2 MG/ML IJ SOLN: 1.0000 mg | INTRAMUSCULAR | Status: DC | PRN

## 2012-06-18 MED ORDER — WHITE PETROLATUM GEL
Status: AC
  Administered 2012-06-18: 02:00:00
  Filled 2012-06-18: qty 5

## 2012-06-18 MED ORDER — BD GETTING STARTED TAKE HOME KIT: 1/2ML X 30G SYRINGES
1.0000 | Freq: Once | Status: AC
  Administered 2012-06-18: 1
  Filled 2012-06-18: qty 1

## 2012-06-18 MED ORDER — INSULIN ASPART 100 UNIT/ML ~~LOC~~ SOLN
10.0000 [IU] | Freq: Once | SUBCUTANEOUS | Status: AC
  Administered 2012-06-18: 10 [IU] via SUBCUTANEOUS

## 2012-06-18 MED ORDER — INSULIN ASPART 100 UNIT/ML ~~LOC~~ SOLN
0.0000 [IU] | Freq: Three times a day (TID) | SUBCUTANEOUS | Status: DC
  Administered 2012-06-19 (×2): 5 [IU] via SUBCUTANEOUS

## 2012-06-18 MED ORDER — KCL IN DEXTROSE-NACL 40-5-0.45 MEQ/L-%-% IV SOLN
INTRAVENOUS | Status: DC
  Administered 2012-06-18: 11:00:00 via INTRAVENOUS
  Filled 2012-06-18 (×4): qty 1000

## 2012-06-18 MED ORDER — POTASSIUM CHLORIDE 10 MEQ/100ML IV SOLN
10.0000 meq | INTRAVENOUS | Status: AC
  Administered 2012-06-18 (×3): 10 meq via INTRAVENOUS
  Filled 2012-06-18: qty 200
  Filled 2012-06-18: qty 100

## 2012-06-18 MED ORDER — POTASSIUM CHLORIDE IN NACL 20-0.45 MEQ/L-% IV SOLN
INTRAVENOUS | Status: DC
  Filled 2012-06-18 (×3): qty 1000

## 2012-06-18 MED ORDER — INSULIN GLARGINE 100 UNIT/ML ~~LOC~~ SOLN
10.0000 [IU] | Freq: Every day | SUBCUTANEOUS | Status: DC
  Administered 2012-06-18: 10 [IU] via SUBCUTANEOUS

## 2012-06-18 MED ORDER — SODIUM CHLORIDE 0.9 % IV SOLN
1000.0000 mL | Freq: Once | INTRAVENOUS | Status: AC
  Administered 2012-06-18: 1000 mL via INTRAVENOUS

## 2012-06-18 MED ORDER — PNEUMOCOCCAL VAC POLYVALENT 25 MCG/0.5ML IJ INJ
0.5000 mL | INJECTION | INTRAMUSCULAR | Status: AC
  Administered 2012-06-19: 0.5 mL via INTRAMUSCULAR
  Filled 2012-06-18: qty 0.5

## 2012-06-18 NOTE — H&P (Signed)
I interviewed and examined this patient and discussed the care plan with Dr. Fara Boros and the [facility] team and agree with assessment and plan as documented in the admission note for yesterday. Says she's feeling better. Still has suprapubic pain at the end of urination. Finished menses 2 days ago. Thirsty, but hasn't been allowed to drink. I would repeat urinalysis, preferably cath, and send it for culture. She says her thin hair is life-long, but has felt cold the past few months. Would check TSH.    Emil Klassen A. Sheffield Slider, MD Family Medicine Teaching Service Attending  06/18/2012 7:51 AM

## 2012-06-18 NOTE — Care Management Note (Signed)
    Page 1 of 1   06/18/2012     2:06:55 PM   CARE MANAGEMENT NOTE 06/18/2012  Patient:  Kristine Allison, Kristine Allison   Account Number:  [id number]  Date Initiated:  06/18/2012  Documentation initiated by:  Donn Pierini  Subjective/Objective Assessment:   Pt admitted with DKA- (new DM)     Action/Plan:   PTA pt lived at home, independent   Anticipated DC Date:  06/20/2012   Anticipated DC Plan:  HOME/SELF CARE  In-house referral  Clinical Social Worker      DC Planning Services  CM consult      Choice offered to / List presented to:             Status of service:  In process, will continue to follow Medicare Important Message given?   (If response is "NO", the following Medicare IM given date fields will be blank) Date Medicare IM given:   Date Additional Medicare IM given:    Discharge Disposition:    Per UR Regulation:  Reviewed for med. necessity/level of care/duration of stay  If discussed at Long Length of Stay Meetings, dates discussed:    Comments:  06/18/12- 1200- Donn Pierini RN, BSN [phone] Spoke with pt at bedside- per conversation pt lives at home- states that she usually uses [facility] for medications. Talked about other pharmacy options for less expensive meds- such as [facility] $4 list for generic meds, and also [facility] low cost DM meds with a VIC card. Talked about low cost glucometers available at [facility]- Pt will need a prescription for glucometer and test strips. Pt was not very engaged in the conversation and stated that she was just ready to go home. Checked with main pharmacy and pt is eligible for medication assistance through the indigent fund. Per primary medical team- pt will be set up with outpt clinic for follow up at discharge. NCM to cont. to follow for d/c needs.

## 2012-06-18 NOTE — Progress Notes (Signed)
Daily Progress Note Family Medicine Teaching Service PGY-2   Patient name: Kristine Allison  Medical record [id number] Date of birth:14-Apr-1981 Age: 31 y.o. Gender: female  LOS: 1 day   Subjective:  No events overnight. Pt still with mild-moderated diffuse abdominal pain. No diarrheas, nausea or vomiting. Afebrile.   Objective: Vitals: Filed Vitals:   06/18/12 1118  BP: 93/44  Pulse: 92  Temp: 98  Resp: 19   Physical Exam: Gen: flat affect pt. No very cooperative with physical examination CV: Regular rate and rhythm, no murmurs rubs or gallops PULM: Clear to auscultation bilaterally. No wheezes/rales/rhonchi ABD: Soft, tender diffusely to light palpation. Not guarding, non distended, normal bowel sounds. Pelvic: no cervical motion tenderness. EXT: No edema Neuro: Alert and oriented x3. No focalization   Labs and imaging:  CBC  Lab 06/18/12 0002 06/17/12 2043  WBC 8.9 7.0  HGB 14.1 15.9*  HCT 40.0 45.4  PLT 191 188   BMET  Lab 06/18/12 1133 06/18/12 0630 06/18/12 0430  NA 135 134* 133*  K 3.1* 2.7* 2.8*  CL 109 105 104  CO2 16* 16* 16*  BUN 5* 7 7  CREATININE 0.52 0.53 0.51  LABGLOM -- -- --  GLUCOSE 86 -- --  CALCIUM 8.0* 8.2* 8.3*    Medications: Scheduled Meds:   . sodium chloride  1,000 mL Intravenous Once  . bd getting started take home kit  1 kit Other Once  . docusate sodium  100 mg Oral BID  . heparin  5,000 Units Subcutaneous Q8H  . influenza  inactive virus vaccine  0.5 mL Intramuscular Tomorrow-1000  . insulin aspart  0-15 Units Subcutaneous TID WC  . insulin aspart  0-5 Units Subcutaneous QHS  . insulin aspart  10 Units Subcutaneous Once  . insulin glargine  10 Units Subcutaneous Daily  . living well with diabetes book   Does not apply Once  . pantoprazole  20 mg Oral Q1200  . pneumococcal 23 valent vaccine  0.5 mL Intramuscular Tomorrow-1000  . potassium chloride  10 mEq Intravenous Q1H  . potassium chloride  10 mEq  Intravenous Q1 Hr x 4  . sodium chloride  1,000 mL Intravenous Once  . white petrolatum       PRN Meds:.acetaminophen, acetaminophen, dextrose, ketorolac, morphine injection, ondansetron (ZOFRAN) IV, ondansetron, zolpidem, DISCONTD: acetaminophen, DISCONTD: acetaminophen  Assessment and Plan: 31 y/o F admitted for new onset DM on DKA. 1. DKA: Pt has close gap. CBG's around 100's. -Plan to stop insulin infusion 1 h after Lantus 10 units.  -SSI sensitive coverage. Will calculate amount insuline given in 24h to determine changes on Lantus coverage tomorrow. -Diabetes education  2. Abdominal pain: no signs of acute abdomen on physical examination. Negative symptoms and signs of pelvic involvement. Most likely part of  DKA presentation.  -will continue toradol x 5 days -addition of morphine 1 mg Q4 PRN -Consider imagingstudies if pain does not improve or other symptoms appear. Close monitoring  FEN/GI: NS with KCL at 125 ml/min. Start advancing CHO modified diet as tolerated. Prophylaxis:  Heparin, protonix Disposition: pending improvement.  D. [place] Rolene Arbour, MD PGY2, Family Medicine Teaching Service  06/18/2012

## 2012-06-18 NOTE — Progress Notes (Signed)
Utilization review completed.  

## 2012-06-18 NOTE — Plan of Care (Signed)
Problem: Food- and Nutrition-Related Knowledge Deficit (NB-1.1) Goal: Nutrition education Formal process to instruct or train a patient/client in a skill or to impart knowledge to help patients/clients voluntarily manage or modify food choices and eating behavior to maintain or improve health.  Outcome: Completed/Met Date Met:  06/18/12  RD consulted for nutrition education regarding diabetes.   RD provided "Carbohydrate Counting for People with Diabetes" handout from the Academy of Nutrition and Dietetics. Provided list of carbohydrates and recommended serving sizes of common foods.  Patient would not engage in conversation more than a few words with RD.  Stated she wanted me to leave the handouts with her and she would read it later.  Patient seemed very disinterested in diet education.  Expect poor compliance.  Body mass index is 20.40 kg/(m^2). Pt meets criteria for normal weight based on current BMI.  Current diet order is CHO-modified medium. Labs and medications reviewed. Patient was admitted with DKA.  Patient with unintentional weight loss (patient unable to quantify) related to uncontrolled blood glucose.    No further nutrition interventions warranted at this time. RD contact information provided. If additional nutrition issues arise, please re-consult RD.  Joaquin Courts, RD, LDN, CNSC Pager# [phone] After Hours Pager# [phone]

## 2012-06-18 NOTE — Progress Notes (Signed)
Clinical Child psychotherapist (CSW) received a referral to connect pt with the orange card. CSW informed FMTS that pt will need to be seen at the [facility] in order to qualify for the orange card. FMTS will speak to pt about being accepted into the clinic. Please reconsult CSW if CSW needs arise.   Theresia Bough, MSW, Theresia Majors [phone]

## 2012-06-18 NOTE — Progress Notes (Signed)
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   Education plan implemented.   Have patient watch videos, practice insulin administration and CBG monitoring, and RD consult.  Patient currently has no PCP.  Care management and social work consults initiated.  If patient continues to follow up with Family Medicine, please set up appointment with Wyona Almas for outpatient diabetes education.   Alfredia Client PhD, RN, BC-ADM Diabetes Coordinator  Office:  [phone] Team Pager:  [phone]

## 2012-06-18 NOTE — Progress Notes (Deleted)
Family Medicine Teaching Service                                                                 [facility] Progress Note     Patient name: Kristine Allison Medical record number: [id number] Date of birth: 1981-03-19 Age: 31 y.o. Gender: female    LOS: 1 day   Primary Care Provider: Per Patient No Pcp  30yo F with asthma is hospital day 1 with DKA   Overnight Events: The patient had no acute events overnight. She remained on gluco-stabilizer overnight which she tolerated well.   Patient continues to complain of lower abdominal pain 8/10 which she states is a little bit improved from last night. She states that the tylenol does not alleviate the pain. Patient denies any dysuria or hematuria. She denies any chills.   Objective: Vital signs in last 24 hours: BP 92/55  Pulse 91  Temp 98 F (36.7 C) (Oral)  Resp 16  Ht 5\' 4"  (1.626 m)  Wt 118 lb 13.3 oz (53.9 kg)  BMI 20.40 kg/m2  SpO2 99%  LMP 06/12/2012    Physical Exam: Gen: NAD HEENT:  Mucosa moist. EOMI.  CV: RRR. No murmurs. Res: Clear breath sounds. No rales. No wheezes. Abd: Soft. Tender to palpation in lower quadrants. Non distended. Bowel sounds present.  Pelvic: No CMT. No adnexal masses palpable.  Ext/Musc: No edema Neuro: Alert. No focal motor or sensory deficits.  Labs/Studies:  02:30 labs Chem: Na 135  K 3.1  Cl 105  CO2 16  BUN 7  Creatinine 0.52  Calcium 8.3  Glucose 181   Corrected Anion Gap: 15  04:30 labs Chem: Na 133  K 2.8  Cl 104  CO2 16  BUN 7  Creatinine 0.51  Calcium 8.3  Glucose 112  Corrected Anion Gap: 13  06:30 labs Chem: Na 134  K 2.7 Cl 105  CO2 16  BUN 7 Creatine 0.53  Calcium 8.2  Glucose 166  Corrected Anion Gap: 13  Medications: Scheduled Meds:   . docusate sodium  100 mg Oral BID  . heparin  5,000 Units Subcutaneous Q8H  . influenza  inactive virus vaccine  0.5 mL Intramuscular Tomorrow-1000  . pantoprazole  20 mg  Oral Q1200  . pneumococcal 23 valent vaccine  0.5 mL Intramuscular Tomorrow-1000  . potassium chloride  10 mEq Intravenous Q1H  . potassium chloride  10 mEq Intravenous Q1 Hr x 4  . sodium chloride  1,000 mL Intravenous Once  . white petrolatum      . DISCONTD: docusate sodium  100 mg Oral BID  . DISCONTD: heparin  5,000 Units Subcutaneous Q8H  . DISCONTD: heparin  5,000 Units Subcutaneous Q8H   Continuous Infusions:   . sodium chloride 150 mL/hr at 06/18/12 0300  . dextrose 5 % and 0.45% NaCl 150 mL/hr at 06/18/12 0400  . insulin (NOVOLIN-R) infusion 5.4 Units/hr (06/18/12 0146)  . DISCONTD: insulin (NOVOLIN-R) infusion 3.1 Units/hr (06/18/12 0007)   PRN Meds:.acetaminophen, acetaminophen, dextrose, ketorolac, ondansetron (ZOFRAN) IV, ondansetron, zolpidem, DISCONTD: acetaminophen, DISCONTD: acetaminophen     Assessment/Plan: 30yo F with asthma hospital day 1 with new onset DKA  1. DKA: Patient does not have a personal history of diabetes. Due to her age of presentation  this is likely a mixed picture of type 1/2. Patient continues to have an anion gap although it is slowly closing (15 -> 13 -> 13). Patient's K has continued to drop (3.1 -> 2.8 -> 2.7) and the patient is currently receiving KCl x 4 doses with last dose set to be received at 08:30.  -- Gluco-stabilizer until gap is closed  -- BMET q 2 hr until gap is closed and K is normalized and then repeat x 1  2 hours after. -- Once gap is closed will start lantus 10 units after gap is closed and then 1 hour later we will d/c the gluco-stabilizer and start sensitive SSI -- Add 40 meq to maintenance fluids -- Diabetes education  2. Lower Abdominal Pain: This pain is likely due to the patient being in DKA. A UA was negative for infection and the patient denies any chills or dysuria. Gonorrhea/Chylmidia is a possibility but is also less likely as the patient had no CMT on exam and denies any vaginal discharge. Patient states that the  toradol is not alleviating the pain is rates the pain as 8/10.  -- Urine gc/chylmidia   -- If urine is negative then we will discontinue toradol and start morphine 3mg  IV q 4 hours prn pain  3. Hypotension: Patient's blood pressures have been low with the most recent being 92/55. Patient denies any symptoms such as being lightheaded or syncope. However, the patient has not been ambulating.  -- 1L bolus NS.   3. GERD -- Continue Protonix 20mg   4. Social: patient has no PCP or insurance -- Social work consult -- Case manamgement for medications -- Arrange outpatient f/u with clinic  5. FEN/GI -- Continue [place] 117ml/hr  -- Advance diet as tolerated  Code Status: Full DVT PPx: Heparin 5000 unites, TID  Dispo: d/c home pending glucose control and diabetes education   Regino Bellow    MS IV

## 2012-06-18 NOTE — Progress Notes (Signed)
Inpatient Diabetes Program Recommendations  AACE/ADA: New Consensus Statement on Inpatient Glycemic Control  Target Ranges:  Prepandial:   less than 140 mg/dL      Peak postprandial:   less than 180 mg/dL (1-2 hours)      Critically ill patients:  140 - 180 mg/dL  Pager:  [phone] Hours:  8 am-10pm   Reason for Visit: New onset Diabetes       Spoke with patient at length concerning new diagnosis of Diabetes.  Patient stated that her mom and dad also have history of diabetes.  She thinks her Dad is Type 1 and her mom is Type 2.  Patient also stated she has had experience giving insulin shots.      Educated patient on s/s of hypoglycemia, insulin administration, sites, etc, and to keep a record of her CBGs.  Patient stated she will follow up in clinic for her diabetes. Patient said she is ready to begin practicing insulin and checking her glucose.    Will continue to follow.  Alfredia Client PhD, RN, BC-ADM Diabetes Coordinator  Office:  [phone] Team Pager:  [phone]

## 2012-06-19 ENCOUNTER — Telehealth: Payer: Self-pay | Admitting: Family Medicine

## 2012-06-19 LAB — GC PROBE AMPLIFICATION, URINE: GC Probe Amp, Urine: NEGATIVE

## 2012-06-19 LAB — URINE CULTURE: Culture: NO GROWTH

## 2012-06-19 LAB — GLUCOSE, CAPILLARY
Glucose-Capillary: 236 mg/dL — ABNORMAL HIGH (ref 70–99)
Glucose-Capillary: 240 mg/dL — ABNORMAL HIGH (ref 70–99)

## 2012-06-19 MED ORDER — INSULIN ASPART 100 UNIT/ML ~~LOC~~ SOLN: 0.0000 [IU] | Freq: Three times a day (TID) | SUBCUTANEOUS | Status: DC

## 2012-06-19 MED ORDER — INSULIN ASPART 100 UNIT/ML ~~LOC~~ SOLN: 8.0000 [IU] | Freq: Three times a day (TID) | SUBCUTANEOUS | Status: DC

## 2012-06-19 MED ORDER — PANTOPRAZOLE SODIUM 20 MG PO TBEC: 20.0000 mg | DELAYED_RELEASE_TABLET | Freq: Every day | ORAL | Status: DC

## 2012-06-19 MED ORDER — INSULIN GLARGINE 100 UNIT/ML ~~LOC~~ SOLN: 20.0000 [IU] | Freq: Every day | SUBCUTANEOUS | Status: DC

## 2012-06-19 MED ORDER — SODIUM CHLORIDE 0.9 % IV SOLN
INTRAVENOUS | Status: DC
  Administered 2012-06-19: 07:00:00 via INTRAVENOUS

## 2012-06-19 MED ORDER — INSULIN GLARGINE 100 UNIT/ML ~~LOC~~ SOLN
20.0000 [IU] | Freq: Every day | SUBCUTANEOUS | Status: DC
  Administered 2012-06-19: 20 [IU] via SUBCUTANEOUS

## 2012-06-19 MED ORDER — INSULIN GLARGINE 100 UNIT/ML ~~LOC~~ SOLN: 15.0000 [IU] | Freq: Every day | SUBCUTANEOUS | Status: DC

## 2012-06-19 MED ORDER — NICOTINE POLACRILEX 2 MG MT GUM
2.0000 mg | CHEWING_GUM | OROMUCOSAL | Status: DC | PRN
  Filled 2012-06-19: qty 1

## 2012-06-19 NOTE — Progress Notes (Signed)
Daily Progress Note Family Medicine Teaching Service PGY-1   Patient name: Kristine Allison  Medical record [id number] Date of birth:04/30/1981 Age: 31 y.o. Gender: female  LOS: 2 days   Subjective:  No events overnight. No complaints this morning. She denies nausea, vomit, diarrhea or abdominal pain. She is hungry. She also would like to try the nicotine gum. She does not want the patch because it makes her sick. She would like to go home. Objective: BP 100/50  Pulse 96  Temp 97.8 F (36.6 C) (Oral)  Resp 15  Ht 5\' 4"  (1.626 m)  Wt 118 lb 13.3 oz (53.9 kg)  BMI 20.40 kg/m2  SpO2 99%  LMP 06/12/2012  Physical Exam: Gen: Up walking around the room. Pleasant, smiling. Cooperative.  CV: Regular rate and rhythm, no murmurs rubs or gallops PULM: Clear to auscultation bilaterally. No wheezes/rales/rhonchi ABD: Soft,NT.ND. Not guarding,  normal bowel sounds. EXT: No edema Neuro: Alert and oriented x3. No focalization   Labs and imaging:  BMP pending.  Medications: Scheduled Meds:   . sodium chloride  1,000 mL Intravenous Once  . bd getting started take home kit  1 kit Other Once  . docusate sodium  100 mg Oral BID  . heparin  5,000 Units Subcutaneous Q8H  . influenza  inactive virus vaccine  0.5 mL Intramuscular Tomorrow-1000  . insulin aspart  0-15 Units Subcutaneous TID WC  . insulin aspart  0-5 Units Subcutaneous QHS  . insulin aspart  10 Units Subcutaneous Once  . insulin glargine  10 Units Subcutaneous Daily  . living well with diabetes book   Does not apply Once  . pantoprazole  20 mg Oral Q1200  . pneumococcal 23 valent vaccine  0.5 mL Intramuscular Tomorrow-1000  . potassium chloride  10 mEq Intravenous Q1H  . potassium chloride  10 mEq Intravenous Q1 Hr x 4  . sodium chloride  1,000 mL Intravenous Once  . white petrolatum       PRN Meds:.acetaminophen, acetaminophen, dextrose, ketorolac, morphine injection, ondansetron (ZOFRAN) IV, ondansetron,  zolpidem  Assessment and Plan: 31 y/o F admitted for new onset DM on DKA. 1. DKA:  - Patients Gap has remained closed. - CBG's over the last 24 hours: 213-->377>333>236 - SSI-moderate coverage.  - Total insulin in 24 hours: 35 units --> will raise Lantus 10 mg to 25mg  today - Diabetes education completed  2. Abdominal pain:  - Most likely part of  DKA presentation.  -will continue toradol x 5 days -addition of morphine 1 mg Q4 PRN- Not needed overnight   FEN/GI: NS with125 ml/min- removed K+ replacement today.  Prophylaxis:  Heparin, protonix Disposition: pending improvement, stabilization of electrolytes and CBG's. Diabetic education has been completed.    06/19/2012

## 2012-06-19 NOTE — Telephone Encounter (Signed)
Please call patient to make an appointment within a week. She was admitted for new onset diabetes and discharged over the weekend. She will be a new patient to the clinic, she does not have insurance and will need orange card information as well. She needs to follow up with an upper level if possible or someone on her care team for TS. If she does not hear from you, she is instructed to call in.  Thank you.

## 2012-06-19 NOTE — Progress Notes (Signed)
I discussed with Dr Kuneff.  I agree with their plans documented in their progress note for today.  

## 2012-06-19 NOTE — Progress Notes (Signed)
Pt d/c home with mom via ambulatory. D/c instructions, f/u appointment, lantus and novolog insulin given with insulin starter kit. Pt watch video [id number] on diabetes before d/c.

## 2012-06-23 ENCOUNTER — Telehealth: Payer: Self-pay | Admitting: Family Medicine

## 2012-06-23 NOTE — Telephone Encounter (Signed)
Spoke with mother who reports patient was discharged from hospital on 09/28. Since 09/29 her feet and legs have been swollen and painful.  No redness  or warmth. Consulted with Dr. Earnest Bailey and  She advises OK to come in tomorrow.  Appointment scheduled but advised to go to Urgent Care if worsening sooner .

## 2012-06-23 NOTE — Telephone Encounter (Signed)
Mom is calling concerned about the amount of pain Kristine Allison is having in her legs and feet.  She is scheduled to come in on Friday for a HFU and has not been seen here before.  Mom would like to speak to the nurse about whether or not she needs to be seen sooner.

## 2012-06-24 ENCOUNTER — Ambulatory Visit (INDEPENDENT_AMBULATORY_CARE_PROVIDER_SITE_OTHER): Payer: Self-pay | Admitting: Family Medicine

## 2012-06-24 ENCOUNTER — Encounter: Payer: Self-pay | Admitting: Family Medicine

## 2012-06-24 VITALS — BP 116/78 | HR 97 | Temp 98.2°F | Ht 64.0 in | Wt 139.0 lb

## 2012-06-24 DIAGNOSIS — E119 Type 2 diabetes mellitus without complications: Secondary | ICD-10-CM

## 2012-06-24 DIAGNOSIS — E1065 Type 1 diabetes mellitus with hyperglycemia: Secondary | ICD-10-CM | POA: Insufficient documentation

## 2012-06-24 DIAGNOSIS — R609 Edema, unspecified: Secondary | ICD-10-CM | POA: Insufficient documentation

## 2012-06-24 LAB — CBC
HCT: 36.7 % (ref 36.0–46.0)
MCHC: 32.7 g/dL (ref 30.0–36.0)
Platelets: 186 10*3/uL (ref 150–400)
RDW: 15.2 % (ref 11.5–15.5)
WBC: 4.1 10*3/uL (ref 4.0–10.5)

## 2012-06-24 LAB — BASIC METABOLIC PANEL
CO2: 28 mEq/L (ref 19–32)
Calcium: 8.1 mg/dL — ABNORMAL LOW (ref 8.4–10.5)
Creat: 0.64 mg/dL (ref 0.50–1.10)
Glucose, Bld: 316 mg/dL — ABNORMAL HIGH (ref 70–99)
Sodium: 138 mEq/L (ref 135–145)

## 2012-06-24 MED ORDER — FUROSEMIDE 20 MG PO TABS
40.0000 mg | ORAL_TABLET | Freq: Once | ORAL | Status: AC
  Administered 2012-06-24: 40 mg via ORAL

## 2012-06-24 NOTE — Progress Notes (Signed)
  Subjective:    Patient ID: Kristine Allison, female    DOB: December 21, 1980, 31 y.o.   MRN: [id number]  HPI 31 yo here for new onset edema  Occurred day after discharge 9/26-27 for DKA, new diagnosis diabetes.  Bilateral LE edema in legs, sore as legs feel tight.  No erythema.  No calf pain.  Also noted some bruises in abdomen where she is giving insulin.  Was not present during hospitalization.  NO nausea, vomiting, dyspnea.  No trouble with urination.  Has been taking lantus and novolog as directed.  Review of Systems    see HPI Objective:   Physical Exam GEN: Alert & Oriented, No acute distress CV:  Regular Rate & Rhythm, no murmur Respiratory:  Normal work of breathing, CTAB Abd:  + BS, soft, no tenderness to palpation Ext: 1+pretibial pitting edema up to knees bilateral.  No calf pain or redness.  Face looks puffy.        Assessment & Plan:

## 2012-06-24 NOTE — Assessment & Plan Note (Signed)
Most likely due to fluid as was given large volume of fluid in DKA while in hospital.  Gave one dose of lasix here in office.  Will check bmet to evaluate electrolytes and kidney function.

## 2012-06-24 NOTE — Patient Instructions (Addendum)
You got a dose of Lasix (fluid pill) today  Stay active, keep your legs elevated when sitting  Will call you with abnormal labs, will send a letter with normal results  Tell front office to put you on orange card list  Follow-up in 3-4 weeks for your diabetes

## 2012-06-24 NOTE — Assessment & Plan Note (Addendum)
Will continue current regimen, follow-up in 3-4 weeks.  Until then, patient plans to apply for medication assistance so we can continue lantus + novolog

## 2012-06-25 ENCOUNTER — Telehealth: Payer: Self-pay | Admitting: Family Medicine

## 2012-06-25 ENCOUNTER — Inpatient Hospital Stay: Payer: Self-pay | Admitting: Family Medicine

## 2012-06-25 NOTE — Telephone Encounter (Signed)
Called patient to report normal lab results except for hyperglycemia and to check on how her swelling was doing.  Left this message on her voicemail.

## 2012-07-16 ENCOUNTER — Ambulatory Visit: Payer: Self-pay | Admitting: Family Medicine

## 2012-07-21 NOTE — Discharge Summary (Signed)
I examined this patient and discussed the care plan with Funches and the [facility]PT[facility] team and agree with assessment and plan as documented in the discharge note above.

## 2012-07-21 NOTE — Discharge Summary (Addendum)
Physician Discharge Summary  Patient ID: KELTY SZAFRAN MRN: [id number] DOB/AGE: [birth year] 30 y.o.  Admit date: 06/17/2012 Discharge date: 06/18/2012  Admission Diagnoses: DKA  New onset diabetes type II Tobacco abuse   Discharge Diagnoses:  DKA , resolved  New onset diabetes type II Tobacco abuse   Discharged Condition: good  Hospital Course:  Pelagia Iacobucci Simmons-Cohn is a thin 31 y.o. year old female with a history of asthma presenting with hyperglycemia of 742, corrected sodium of 134.3, with anion gap of 36.3 and abdominal pain. No previous history of diabetes.   1. DKA: patient admitted to step down, started on glucose stabilizer. She was transitioned off the glucose stabilizer on hospital day 2 when her anion gap closed. She received diabetic education and instruction on self administering subcutaneous insulin. She received a glucometer from the [facility] clinic.   2. Tobacco abuse: patient received a nicotine patch while in the hospital. She was in the contemplative stage of quitting.    Consults: None  Significant Diagnostic Studies: labs:  Lab Results  Component Value Date   HGBA1C 14.6* 06/18/2012   Lab Results  Component Value Date   TSH 2.994 06/18/2012   Urine culture: negative.   CBGs in past 24 hours: 213-->377>333>236  Treatments: IV hydration and insulin: regular via glucose stabilizer. Transitioned to lantus.   Discharge Exam: BP 100/50  Pulse 96  Temp 97.8 F (36.6 C) (Oral)  Resp 15  Ht 5\' 4"  (1.626 m)  Wt 118 lb 13.3 oz (53.9 kg)  BMI 20.40 kg/m2  SpO2 99%  LMP 06/12/2012  Physical Exam:  Gen: Up walking around the room. Pleasant, smiling. Cooperative.  CV: Regular rate and rhythm, no murmurs rubs or gallops  PULM: Clear to auscultation bilaterally. No wheezes/rales/rhonchi  ABD: Soft,NT.ND. Not guarding, normal bowel sounds.  EXT: No edema  Neuro: Alert and oriented x3. No focalization   Disposition: 01-Home or Self Care  Discharge  Orders    Future Orders Please Complete By Expires   Diet Carb Modified      Increase activity slowly      Discharge instructions      Comments:   Please follow up with [facility] this week. We have forwarded your information to the clinic, they should call you Monday with an appointment for this week, if you do not hear form them by Monday afternoon please call them at [phone].       Medication List     As of 07/21/2012  9:37 AM    TAKE these medications         acetaminophen 500 MG tablet   Commonly known as: TYLENOL   Take 500 mg by mouth every 6 (six) hours as needed. For pain      ibuprofen 200 MG tablet   Commonly known as: ADVIL,MOTRIN   Take 400 mg by mouth every 6 (six) hours as needed. For pain      insulin aspart 100 UNIT/ML injection   Commonly known as: novoLOG   Inject 8 Units into the skin 3 (three) times daily before meals.      insulin glargine 100 UNIT/ML injection   Commonly known as: LANTUS   Inject 20 Units into the skin daily.      pantoprazole 20 MG tablet   Commonly known as: PROTONIX   Take 1 tablet (20 mg total) by mouth daily at 12 noon.           Follow-up Information  Schedule an appointment as soon as possible for a visit in 1 week to follow up.   Contact information:   [facility] [address], [place] [phone]         Signed: Dessa Phi 07/21/2012, 9:37 AM

## 2012-07-23 NOTE — Discharge Summary (Signed)
I examined this patient and discussed the care plan with Dr Armen Pickup and the [facility] team and agree with assessment and plan as documented in the discharge note above.

## 2013-01-08 ENCOUNTER — Encounter (HOSPITAL_COMMUNITY): Payer: Self-pay | Admitting: Cardiology

## 2013-01-08 ENCOUNTER — Emergency Department (HOSPITAL_COMMUNITY)
Admission: EM | Admit: 2013-01-08 | Discharge: 2013-01-08 | Disposition: A | Discharge status: Home / Self Care | Payer: Self-pay | Attending: Emergency Medicine | Admitting: Emergency Medicine

## 2013-01-08 DIAGNOSIS — IMO0001 Reserved for inherently not codable concepts without codable children: Secondary | ICD-10-CM | POA: Insufficient documentation

## 2013-01-08 DIAGNOSIS — R209 Unspecified disturbances of skin sensation: Secondary | ICD-10-CM | POA: Insufficient documentation

## 2013-01-08 DIAGNOSIS — Z794 Long term (current) use of insulin: Secondary | ICD-10-CM | POA: Insufficient documentation

## 2013-01-08 DIAGNOSIS — M7989 Other specified soft tissue disorders: Secondary | ICD-10-CM | POA: Insufficient documentation

## 2013-01-08 DIAGNOSIS — J45909 Unspecified asthma, uncomplicated: Secondary | ICD-10-CM | POA: Insufficient documentation

## 2013-01-08 DIAGNOSIS — F17200 Nicotine dependence, unspecified, uncomplicated: Secondary | ICD-10-CM | POA: Insufficient documentation

## 2013-01-08 DIAGNOSIS — E119 Type 2 diabetes mellitus without complications: Secondary | ICD-10-CM | POA: Insufficient documentation

## 2013-01-08 HISTORY — DX: Type 2 diabetes mellitus without complications: E11.9

## 2013-01-08 LAB — URINALYSIS, ROUTINE W REFLEX MICROSCOPIC
Glucose, UA: >1000 mg/dL — AB
Hgb urine dipstick: NEGATIVE
Leukocytes, UA: NEGATIVE
Protein, ur: NEGATIVE mg/dL
Specific Gravity, Urine: 1.019 (ref 1.005–1.030)
pH: 6.5 (ref 5.0–8.0)

## 2013-01-08 LAB — CBC WITH DIFFERENTIAL/PLATELET
Eosinophils Absolute: 0.5 10*3/uL (ref 0.0–0.7)
Hemoglobin: 11.7 g/dL — ABNORMAL LOW (ref 12.0–15.0)
Lymphocytes Relative: 23 % (ref 12–46)
Lymphs Abs: 1.7 10*3/uL (ref 0.7–4.0)
Monocytes Relative: 6 % (ref 3–12)
Neutro Abs: 4.8 10*3/uL (ref 1.7–7.7)
Neutrophils Relative %: 65 % (ref 43–77)
Platelets: 204 10*3/uL (ref 150–400)
RBC: 3.71 MIL/uL — ABNORMAL LOW (ref 3.87–5.11)
WBC: 7.3 10*3/uL (ref 4.0–10.5)

## 2013-01-08 LAB — COMPREHENSIVE METABOLIC PANEL
ALT: 12 U/L (ref 0–35)
AST: 12 U/L (ref 0–37)
Alkaline Phosphatase: 67 U/L (ref 39–117)
CO2: 28 mEq/L (ref 19–32)
Calcium: 8.7 mg/dL (ref 8.4–10.5)
GFR calc non Af Amer: >90 mL/min (ref >90)
Potassium: 3.9 mEq/L (ref 3.5–5.1)
Sodium: 136 mEq/L (ref 135–145)

## 2013-01-08 MED ORDER — FUROSEMIDE 20 MG PO TABS: 20.0000 mg | ORAL_TABLET | Freq: Two times a day (BID) | ORAL | Status: DC

## 2013-01-08 MED ORDER — SODIUM CHLORIDE 0.9 % IV SOLN: Freq: Once | INTRAVENOUS | Status: DC

## 2013-01-08 MED ORDER — SODIUM CHLORIDE 0.9 % IV BOLUS (SEPSIS): 500.0000 mL | Freq: Once | INTRAVENOUS | Status: DC

## 2013-01-08 MED ORDER — HYDROCODONE-ACETAMINOPHEN 5-325 MG PO TABS
1.0000 | ORAL_TABLET | Freq: Once | ORAL | Status: AC
  Administered 2013-01-08: 1 via ORAL
  Filled 2013-01-08: qty 1

## 2013-01-08 MED ORDER — SODIUM CHLORIDE 0.9 % IV BOLUS (SEPSIS): 500.0000 mL | INTRAVENOUS | Status: DC

## 2013-01-08 MED ORDER — INSULIN ASPART 100 UNIT/ML ~~LOC~~ SOLN
10.0000 [IU] | Freq: Once | SUBCUTANEOUS | Status: AC
  Administered 2013-01-08: 10 [IU] via SUBCUTANEOUS
  Filled 2013-01-08: qty 1

## 2013-01-08 NOTE — ED Notes (Signed)
Pt refusing IV at this time.  Will notify PA.

## 2013-01-08 NOTE — ED Notes (Signed)
Pt unable to void for urine sample at this time. 

## 2013-01-08 NOTE — ED Provider Notes (Signed)
History     CSN: [id number]  Arrival date & time 01/08/13  4098   First MD Initiated Contact with Patient 01/08/13 [phone]      Chief Complaint  Patient presents with  . Leg Swelling    (Consider location/radiation/quality/duration/timing/severity/associated sxs/prior treatment) HPI  Patient is a 32 year old female past medical history significant for diabetes mellitus presents to the ED for 2-3 days of bilateral leg swelling. Patient states she first noticed leg swelling while sitting down, no precipitating event. She does not feel that the legs are becoming more swollen with time, just not resolving. Describes pain as throbbing in nature constant, 9/10 pain without radiation. Patient is able to ambulate on legs. Elevating legs up to alleviate swelling. Ambulating aggravates swelling. Has some associated numbness. Denies recent travel, surgery, immobilization, history of DVT/PE. Denies history of cellulitis or other skin infections. Denies recent tattoos. Denies injury. Denies paralysis, temperature change from rest of body, erythema. Patient currently uses insulin shots for diabetes. States her sugars run anywhere from the 200s to 600s.   Past Medical History  Diagnosis Date  . Asthma   . Diabetes mellitus without complication     History reviewed. No pertinent past surgical history.  History reviewed. No pertinent family history.  History  Substance Use Topics  . Smoking status: Current Every Day Smoker -- 0.33 packs/day    Types: Cigarettes  . Smokeless tobacco: Not on file  . Alcohol Use: No    OB History   Grav Para Term Preterm Abortions TAB SAB Ect Mult Living                  Review of Systems  Constitutional: Negative.   HENT: Negative.   Respiratory: Negative.   Cardiovascular: Positive for leg swelling.  Gastrointestinal: Negative.   Genitourinary: Negative.   Musculoskeletal: Positive for myalgias.  Skin: Negative.   Allergic/Immunologic: Positive for  immunocompromised state.  Neurological: Negative.     Allergies  Review of patient's allergies indicates no known allergies.  Home Medications   Current Outpatient Rx  Name  Route  Sig  Dispense  Refill  . acetaminophen (TYLENOL) 500 MG tablet   Oral   Take 500 mg by mouth every 6 (six) hours as needed for pain.          Marland Kitchen ibuprofen (ADVIL,MOTRIN) 200 MG tablet   Oral   Take 400 mg by mouth every 6 (six) hours as needed for pain.          Marland Kitchen insulin aspart (NOVOLOG) 100 UNIT/ML injection   Subcutaneous   Inject 12 Units into the skin 3 (three) times daily before meals.         . insulin glargine (LANTUS) 100 UNIT/ML injection   Subcutaneous   Inject 20 Units into the skin daily.           BP 114/46  Pulse 105  Temp(Src) 97.7 F (36.5 C) (Oral)  Resp 18  SpO2 98%  Physical Exam  Constitutional: She is oriented to person, place, and time. She appears well-developed and well-nourished. No distress.  HENT:  Head: Normocephalic and atraumatic.  Eyes: Conjunctivae are normal.  Neck: Neck supple.  Cardiovascular: Normal rate, regular rhythm and normal heart sounds.   Pulmonary/Chest: Effort normal and breath sounds normal. No respiratory distress.  Abdominal: Soft.  Musculoskeletal:       Right lower leg: She exhibits tenderness. She exhibits no bony tenderness, no swelling, no edema, no deformity and no laceration.  Left lower leg: She exhibits tenderness. She exhibits no bony tenderness, no edema, no deformity and no laceration.       Right foot: She exhibits normal range of motion, no tenderness and normal capillary refill.       Left foot: She exhibits normal range of motion, no tenderness and normal capillary refill.  Neurological: She is alert and oriented to person, place, and time.  Skin: She is not diaphoretic.    ED Course  Procedures (including critical care time)  Patient was going to be started on IV fluids for elevated blood sugars but  patient declined IV insertion and IV fluids to both nurse and myself. Patient was made aware of other fluids at help lower the blood sugars. The patient verbalized understanding and still declined.  Labs Reviewed  CBC WITH DIFFERENTIAL - Abnormal; Notable for the following:    RBC 3.71 (*)    Hemoglobin 11.7 (*)    HCT 34.5 (*)    Eosinophils Relative 6 (*)    All other components within normal limits  URINALYSIS, ROUTINE W REFLEX MICROSCOPIC - Abnormal; Notable for the following:    Glucose, UA >1000 (*)    All other components within normal limits  COMPREHENSIVE METABOLIC PANEL - Abnormal; Notable for the following:    Glucose, Bld 423 (*)    Total Protein 5.8 (*)    Albumin 2.9 (*)    Total Bilirubin 0.2 (*)    All other components within normal limits  GLUCOSE, CAPILLARY - Abnormal; Notable for the following:    Glucose-Capillary 415 (*)    All other components within normal limits  URINE MICROSCOPIC-ADD ON - Abnormal; Notable for the following:    Squamous Epithelial / LPF FEW (*)    All other components within normal limits  PRO B NATRIURETIC PEPTIDE   No results found.   1. Leg swelling       MDM  Patient is a 32 year old female presenting with bilateral lower leg swelling that began 2 days ago. Patient has diabetes mellitus that appears to be poorly uncontrolled as patient states her sugars run anywhere from the 200s to the 600s. Physical examination was benign, no gross swelling appreciated, no pitting edema, no erythema, no decreased pulses, no pallor, no decreased range of motion, no paralysis, no wounds appreciated or other breaks in the skin. No concern for DVT as patient did not meet any of the wells criteria. Ruled out cellulitis based on physical exam findings and laboratory findings. Patient's complaint for bilateral leg swelling is most likely to being hypoalbuminemic. Patient declined IV fluids to lower blood sugars despite adequate discussion about the  importance. Patient verbalized understanding and still declined. Patient was sent home with Lasix to help with hypoalbuminemia and swelling. Advised to follow up with her care doctor. Patient case discussed with Dr. [name], who agrees with plan. Patient stable at time of discharge.     Jeannetta Ellis, PA-C 01/08/13 1457

## 2013-01-08 NOTE — ED Notes (Signed)
Pt reports swelling to her legs over the past couple of days. States the swelling decreases when she elevates her legs.

## 2013-01-09 NOTE — ED Provider Notes (Signed)
Medical screening examination/treatment/procedure(s) were performed by non-physician practitioner and as supervising physician I was immediately available for consultation/collaboration.   Nelia Shi, MD 01/09/13 2041

## 2013-06-05 ENCOUNTER — Encounter (HOSPITAL_COMMUNITY): Payer: Self-pay | Admitting: *Deleted

## 2013-06-05 ENCOUNTER — Emergency Department (HOSPITAL_COMMUNITY): Payer: Self-pay

## 2013-06-05 ENCOUNTER — Emergency Department (HOSPITAL_COMMUNITY)
Admission: EM | Admit: 2013-06-05 | Discharge: 2013-06-05 | Disposition: A | Discharge status: Nursing Home (Includes ICF) | Payer: Self-pay | Source: Home / Self Care | Attending: Family Medicine | Admitting: Family Medicine

## 2013-06-05 ENCOUNTER — Inpatient Hospital Stay (HOSPITAL_COMMUNITY)
Admission: EM | Admit: 2013-06-05 | Discharge: 2013-06-07 | DRG: 639 | Disposition: A | Discharge status: Home / Self Care | Payer: Self-pay | Attending: Family Medicine | Admitting: Family Medicine

## 2013-06-05 ENCOUNTER — Encounter (HOSPITAL_COMMUNITY): Payer: Self-pay | Admitting: Neurology

## 2013-06-05 DIAGNOSIS — F17200 Nicotine dependence, unspecified, uncomplicated: Secondary | ICD-10-CM | POA: Diagnosis present

## 2013-06-05 DIAGNOSIS — E1310 Other specified diabetes mellitus with ketoacidosis without coma: Principal | ICD-10-CM | POA: Diagnosis present

## 2013-06-05 DIAGNOSIS — R739 Hyperglycemia, unspecified: Secondary | ICD-10-CM

## 2013-06-05 DIAGNOSIS — Z91199 Patient's noncompliance with other medical treatment and regimen due to unspecified reason: Secondary | ICD-10-CM

## 2013-06-05 DIAGNOSIS — R7309 Other abnormal glucose: Secondary | ICD-10-CM

## 2013-06-05 DIAGNOSIS — Z9119 Patient's noncompliance with other medical treatment and regimen: Secondary | ICD-10-CM

## 2013-06-05 DIAGNOSIS — K219 Gastro-esophageal reflux disease without esophagitis: Secondary | ICD-10-CM | POA: Diagnosis present

## 2013-06-05 DIAGNOSIS — R609 Edema, unspecified: Secondary | ICD-10-CM

## 2013-06-05 DIAGNOSIS — E860 Dehydration: Secondary | ICD-10-CM | POA: Diagnosis present

## 2013-06-05 DIAGNOSIS — K3189 Other diseases of stomach and duodenum: Secondary | ICD-10-CM | POA: Diagnosis present

## 2013-06-05 DIAGNOSIS — E1110 Type 2 diabetes mellitus with ketoacidosis without coma: Secondary | ICD-10-CM

## 2013-06-05 DIAGNOSIS — Z794 Long term (current) use of insulin: Secondary | ICD-10-CM

## 2013-06-05 DIAGNOSIS — E119 Type 2 diabetes mellitus without complications: Secondary | ICD-10-CM

## 2013-06-05 DIAGNOSIS — R748 Abnormal levels of other serum enzymes: Secondary | ICD-10-CM | POA: Diagnosis present

## 2013-06-05 DIAGNOSIS — J45909 Unspecified asthma, uncomplicated: Secondary | ICD-10-CM | POA: Diagnosis present

## 2013-06-05 DIAGNOSIS — R0609 Other forms of dyspnea: Secondary | ICD-10-CM

## 2013-06-05 DIAGNOSIS — D899 Disorder involving the immune mechanism, unspecified: Secondary | ICD-10-CM | POA: Diagnosis present

## 2013-06-05 DIAGNOSIS — R000 Tachycardia, unspecified: Secondary | ICD-10-CM | POA: Diagnosis present

## 2013-06-05 DIAGNOSIS — N179 Acute kidney failure, unspecified: Secondary | ICD-10-CM

## 2013-06-05 DIAGNOSIS — R0603 Acute respiratory distress: Secondary | ICD-10-CM

## 2013-06-05 DIAGNOSIS — Z5987 Material hardship due to limited financial resources, not elsewhere classified: Secondary | ICD-10-CM

## 2013-06-05 DIAGNOSIS — R0682 Tachypnea, not elsewhere classified: Secondary | ICD-10-CM | POA: Diagnosis present

## 2013-06-05 DIAGNOSIS — Z598 Other problems related to housing and economic circumstances: Secondary | ICD-10-CM

## 2013-06-05 LAB — POCT I-STAT, CHEM 8
BUN: 33 mg/dL — ABNORMAL HIGH (ref 6–23)
Chloride: 103 mEq/L (ref 96–112)
Creatinine, Ser: 1.3 mg/dL — ABNORMAL HIGH (ref 0.50–1.10)
Glucose, Bld: >700 mg/dL (ref 70–99)
HCT: 56 % — ABNORMAL HIGH (ref 36.0–46.0)
Potassium: 5.1 mEq/L (ref 3.5–5.1)

## 2013-06-05 LAB — URINALYSIS, ROUTINE W REFLEX MICROSCOPIC
Glucose, UA: >1000 mg/dL — AB
Leukocytes, UA: NEGATIVE
Protein, ur: NEGATIVE mg/dL
Specific Gravity, Urine: 1.026 (ref 1.005–1.030)
pH: 5 (ref 5.0–8.0)

## 2013-06-05 LAB — GLUCOSE, CAPILLARY: Glucose-Capillary: 520 mg/dL — ABNORMAL HIGH (ref 70–99)

## 2013-06-05 LAB — CBC WITH DIFFERENTIAL/PLATELET
Basophils Absolute: 0 10*3/uL (ref 0.0–0.1)
Basophils Relative: 0 % (ref 0–1)
Hemoglobin: 16.9 g/dL — ABNORMAL HIGH (ref 12.0–15.0)
MCHC: 33.4 g/dL (ref 30.0–36.0)
Monocytes Relative: 5 % (ref 3–12)
Neutro Abs: 14.4 10*3/uL — ABNORMAL HIGH (ref 1.7–7.7)
Neutrophils Relative %: 89 % — ABNORMAL HIGH (ref 43–77)

## 2013-06-05 LAB — COMPREHENSIVE METABOLIC PANEL
AST: 10 U/L (ref 0–37)
Albumin: 4.4 g/dL (ref 3.5–5.2)
Alkaline Phosphatase: 128 U/L — ABNORMAL HIGH (ref 39–117)
BUN: 35 mg/dL — ABNORMAL HIGH (ref 6–23)
Chloride: 85 mEq/L — ABNORMAL LOW (ref 96–112)
Potassium: 5 mEq/L (ref 3.5–5.1)
Total Bilirubin: 0.5 mg/dL (ref 0.3–1.2)

## 2013-06-05 LAB — KETONES, QUALITATIVE

## 2013-06-05 MED ORDER — SODIUM CHLORIDE 0.9 % IV BOLUS (SEPSIS)
1000.0000 mL | Freq: Once | INTRAVENOUS | Status: AC
  Administered 2013-06-05: 1000 mL via INTRAVENOUS

## 2013-06-05 MED ORDER — OXYCODONE-ACETAMINOPHEN 5-325 MG PO TABS
1.0000 | ORAL_TABLET | ORAL | Status: DC | PRN
  Administered 2013-06-06 – 2013-06-07 (×4): 1 via ORAL
  Filled 2013-06-05 (×4): qty 1

## 2013-06-05 MED ORDER — DEXTROSE-NACL 5-0.45 % IV SOLN
INTRAVENOUS | Status: DC
  Administered 2013-06-06: 03:00:00 via INTRAVENOUS
  Administered 2013-06-06: 125 mL/h via INTRAVENOUS

## 2013-06-05 MED ORDER — SODIUM CHLORIDE 0.9 % IV SOLN
INTRAVENOUS | Status: DC
  Administered 2013-06-05: via INTRAVENOUS

## 2013-06-05 MED ORDER — HEPARIN SODIUM (PORCINE) 5000 UNIT/ML IJ SOLN
5000.0000 [IU] | Freq: Three times a day (TID) | INTRAMUSCULAR | Status: DC
  Administered 2013-06-06 – 2013-06-07 (×5): 5000 [IU] via SUBCUTANEOUS
  Filled 2013-06-05 (×8): qty 1

## 2013-06-05 MED ORDER — ALBUTEROL SULFATE (5 MG/ML) 0.5% IN NEBU
5.0000 mg | INHALATION_SOLUTION | Freq: Once | RESPIRATORY_TRACT | Status: AC
  Administered 2013-06-05: 5 mg via RESPIRATORY_TRACT

## 2013-06-05 MED ORDER — ALBUTEROL SULFATE (5 MG/ML) 0.5% IN NEBU
INHALATION_SOLUTION | RESPIRATORY_TRACT | Status: AC
  Filled 2013-06-05: qty 1

## 2013-06-05 MED ORDER — IPRATROPIUM BROMIDE 0.02 % IN SOLN
0.5000 mg | Freq: Once | RESPIRATORY_TRACT | Status: AC
  Administered 2013-06-05: 0.5 mg via RESPIRATORY_TRACT

## 2013-06-05 MED ORDER — SODIUM CHLORIDE 0.9 % IV SOLN
Freq: Once | INTRAVENOUS | Status: AC
  Administered 2013-06-05: 18:00:00 via INTRAVENOUS

## 2013-06-05 MED ORDER — PANTOPRAZOLE SODIUM 40 MG PO TBEC
40.0000 mg | DELAYED_RELEASE_TABLET | Freq: Every day | ORAL | Status: DC
  Administered 2013-06-06 – 2013-06-07 (×3): 40 mg via ORAL
  Filled 2013-06-05 (×3): qty 1

## 2013-06-05 MED ORDER — SODIUM CHLORIDE 0.9 % IV SOLN: INTRAVENOUS | Status: AC

## 2013-06-05 MED ORDER — ALBUTEROL SULFATE HFA 108 (90 BASE) MCG/ACT IN AERS
2.0000 | INHALATION_SPRAY | Freq: Two times a day (BID) | RESPIRATORY_TRACT | Status: DC | PRN
  Administered 2013-06-06 (×2): 2 via RESPIRATORY_TRACT
  Filled 2013-06-05: qty 6.7

## 2013-06-05 MED ORDER — SODIUM CHLORIDE 0.9 % IV SOLN: Freq: Once | INTRAVENOUS | Status: DC

## 2013-06-05 MED ORDER — POTASSIUM CHLORIDE 10 MEQ/100ML IV SOLN
10.0000 meq | INTRAVENOUS | Status: AC
  Administered 2013-06-06 (×2): 10 meq via INTRAVENOUS
  Filled 2013-06-05 (×2): qty 100

## 2013-06-05 MED ORDER — ALBUTEROL SULFATE (5 MG/ML) 0.5% IN NEBU: 5.0000 mg | INHALATION_SOLUTION | Freq: Once | RESPIRATORY_TRACT | Status: DC

## 2013-06-05 MED ORDER — DEXTROSE 50 % IV SOLN: 25.0000 mL | INTRAVENOUS | Status: DC | PRN

## 2013-06-05 MED ORDER — SODIUM CHLORIDE 0.9 % IV SOLN
INTRAVENOUS | Status: DC
  Administered 2013-06-05: 5.1 [IU]/h via INTRAVENOUS
  Filled 2013-06-05: qty 1

## 2013-06-05 MED ORDER — HEPARIN SODIUM (PORCINE) 5000 UNIT/ML IJ SOLN
5000.0000 [IU] | Freq: Three times a day (TID) | INTRAMUSCULAR | Status: DC
  Filled 2013-06-05 (×4): qty 1

## 2013-06-05 MED ORDER — GI COCKTAIL ~~LOC~~
30.0000 mL | Freq: Once | ORAL | Status: AC
  Administered 2013-06-06: 30 mL via ORAL
  Filled 2013-06-05: qty 30

## 2013-06-05 NOTE — H&P (Signed)
Family Medicine Teaching [facility] Admission History and Physical Service Pager: [phone]  Patient name: [name] Medical record number: [id number] Date of birth: March 07, 1981 Age: 32 y.o. Gender: female  Primary Care Provider: Janit Pagan, MD Consultants: none Code Status: full  Chief Complaint: SOB, polyuria, polyphagia  Assessment and Plan: [name] is a 32 y.o. female presenting in DKA with CBG 832, gap of 42 . PMH is significant for asthma and poorly controlled DM (on lantus and humalog).  #DKA- Patient with hx of very poorly controlled DM at home. Is unable to afford meds and has only been sharing lantus doses with mother. CBGs> 500 at home on a regular basis. Likely exacerbated in setting of acute infection (appears to be pulm in origin vs upper respiratory). Gap 42, bicarb 8, CBG initially 862, already improving with rehydration. Will definitely need outpt coordination of obtaining medications, with adequate follow up given complex social situation Plan: -rehydation with NS until CBG less than 250 and D5 1/2 NS once CBGs less than 250 -insulin continuous infusion 0.1u/kg/hr with desired decrease in glucose by 50-75mg /dk/hr; when goal glucose <250 can decrease rate to 0.05u/kg/hr, will continue infusion until metabolic acidosis resolves and gap closes -monitor for closing of gap, improvement of acidosis-once this occurs can transition to lantus 2 hours prior to d/c of insulin drip. -repletion of K, current BMET with 5 (but falsely normal given DKA, expect drop as k driven back into cells with insulin/rehydration) -monitoring of mental status -cbgs q1 -pulse ox, vitals q4 -trend BMETs q2 -strict is/os -trend urine ketones for neg X2 -hypoglycemic protocol -social work consult   #Epigastric suprapubic pain/GERD- likely related to DKA vs GERD vs pancreatitis vs UTI. Patient with hx of GERD and attesting to sour taste in mouth. Has had regular BMs  (every 2 days at patient's baseline) and still passing gas. Pain also diffuse and located across suprapubic region. No dysuria, UA without signs of infection making UTI less likely. Also unlikely pancreatitis given location and symptoms but will obtain lipase for eval.  Plan: -protonix -GI cocktail -lipase -serial abdominal exams  #SOB/malaise- Likely 2/2 to above but also with concern for acute infection given lung exam which was sig for coarse breath sounds in bilateral bases and white count vs PE vs asthma exacerbation. Of note patient received flu vax approx month ago but given poorly controlled DM is technically immunocompromised. Pt also attesting to pain with deep breaths although it appears that pain is mostly located in the epigastric region. Will obtain d-dimer, well's score low (1.5). Patient with hx of asthma and tobacco use although lungs not diffusely wheezy this pm. More concerning for URI vs pna. However CXR without evidence of infiltrate.  Plan -treat DKA as above -influenza by pcr -trend CBCs -d-dimer with plan for CTA if elevated -albuterol prn -pulse ox, vitals  # AKI: Cr of 1.3 in the ED, improved to 1.12 on admission following some rehydration. - will continue to follow on BMET - rehydrate per above  # Elevated Alk Phos: to 128. Previous value 67. Normal AST and ALT make gall stones at this time unlikely. - consider GGT in the am to evaluate for biliary tree cause - consider abd [place] to evaluate for cause  FEN/GI: rehydration per DKA protocol (NS alternating with D51/2 once cbgs <250) replete electrolytes prn (potassium ), NPO Prophylaxis: heparin sq  Disposition: admit to step down for management of DKA and generalized malaise  History of Present Illness: Kristine  E Allison is a 32 y.o. female who has a hx of poor medication compliance who presents with SOB and general malaise for the past 1.5 days.   Had noted SOB since last night with non productive cough  and generalized malaise and muscle aches. No chest pains or palps. No precipitating factors. Became increasingly worse which prompted patient to present to urgent care. At urgent care they noted the patient to be in respiratory distress and probably DKA. She was then transferred to the ED for evaluation. Of note patient received her flu shot and pneumovax last month.  Has missed approx 2 months of novolog. Takes lantus every day because she shares this medication with her mother. Has trouble affording insulin which is reason why she has not taken. Checks CBGs at home intermittently runs between 500-600 on a regular basis. Has been having polyuria more recently but no dysuria. Noticed increased thirst lately. States that she typically drinks more than she eats.  Also c/o acid and indigestion. Per patient has a hx of reflux. Sour taste in mouth is associated with upper abdominal discomfort.  Has BM every 2 days, normal in caliber.   In the ED pt was noted to be tachycardic and tachypnec with coarse breath sounds in the right base. Labs consistent with DKA, POCT> 600 and bicarb of 8 with a AG of 42, urine with ketones >80. Patient was given 2L of fluids and started on an insulin drip. CXR done revealing no acute cardiopulmonary processes.  Review Of Systems: 2 point review of systems was performed and was unremarkable.  Patient Active Problem List   Diagnosis Date Noted  . Edema 06/24/2012  . Diabetes mellitus 06/24/2012  . GERD (gastroesophageal reflux disease) 06/17/2012   Past Medical History: Past Medical History  Diagnosis Date  . Asthma   . Diabetes mellitus without complication    Past Surgical History: History reviewed. No pertinent past surgical history. Social History: History  Substance Use Topics  . Smoking status: Current Every Day Smoker    Types: Cigarettes  . Smokeless tobacco: Not on file  . Alcohol Use: No   Additional social history: works at [employer] Please also  refer to relevant sections of EMR.  Family History: No family history on file. Allergies and Medications: Allergies  Allergen Reactions  . Latex Rash   No current facility-administered medications on file prior to encounter.   Current Outpatient Prescriptions on File Prior to Encounter  Medication Sig Dispense Refill  . ibuprofen (ADVIL,MOTRIN) 200 MG tablet Take 400 mg by mouth every 6 (six) hours as needed for pain.       Marland Kitchen insulin glargine (LANTUS) 100 UNIT/ML injection Inject 20 Units into the skin 2 (two) times daily.         Objective: BP 129/78  Pulse 136  Temp(Src) 98.4 F (36.9 C) (Oral)  Resp 20  SpO2 100% Exam: General: lying in bed with eyes closed, appears unwell HEENT: NCAT, PERRL, mildly erythematous palate with no exudates, dry MMM  Cardiovascular: Tachycardiac, regular rhythm, no murmurs rubs or gallops Respiratory: tachypneic, sating 100% on RA, lungs with coarse breath sounds in right base, no wheezes Abdomen: diffusely tender in suprapubic region as well as epigastric area, soft, non distended, normoactive bowel sounds Extremities: moves extremities spontaneously, no deficits noted Skin: no rashes, bruises or lesions  Neuro: alert & oriented X4, gait not tested   Labs and Imaging: CBC BMET   Recent Labs Lab 06/05/13 1936  WBC 16.2*  HGB  16.9*  HCT 50.6*  PLT 347    Recent Labs Lab 06/05/13 1936  NA 135  K 5.0  CL 85*  CO2 8*  BUN 35*  CREATININE 1.12*  GLUCOSE 823*  CALCIUM 10.4     AG 42 U/A ketones >80 CBG (last 3)   Recent Labs  06/05/13 1822 06/05/13 2023 06/05/13 2142  GLUCAP >600* 566* 520*   CXR-06/05/13 no acute process   Anselm Lis, MD 06/05/2013, 9:44 PM PGY-1, [facility] FPTS Intern pager: [phone], text pages welcome  Upper Level Addendum:  I have seen and evaluated this patient along with Dr. Michail Jewels and reviewed the above note, making necessary revisions in red.   Marikay Alar,  MD Family Medicine PGY-2

## 2013-06-05 NOTE — ED Notes (Signed)
Breathing treatment initiated per [facility] as pt leaving dept.

## 2013-06-05 NOTE — ED Notes (Signed)
Family at bedside. 

## 2013-06-05 NOTE — ED Provider Notes (Signed)
EKG: Indication tachycardia interpretation sinus tachycardia no ST-T wave changes noted prolonged QT at 0.517  Claudean Kinds, MD 06/05/13 [phone]

## 2013-06-05 NOTE — ED Provider Notes (Signed)
CSN: [id number]     Arrival date & time 06/05/13  1819 History   First MD Initiated Contact with Patient 06/05/13 1821     Chief Complaint  Patient presents with  . Diabetic Ketoacidosis   (Consider location/radiation/quality/duration/timing/severity/associated sxs/prior Treatment) Patient is a 32 y.o. female presenting with shortness of breath. The history is provided by the patient. No language interpreter was used.  Shortness of Breath Severity:  Severe Onset quality:  Sudden Timing:  Constant Progression:  Worsening Chronicity:  New Context: not URI   Relieved by:  Nothing Worsened by:  Exertion and activity Ineffective treatments:  None tried Associated symptoms: cough   Associated symptoms: no abdominal pain, no chest pain, no fever, no headaches, no rash, no sore throat and no vomiting   Risk factors: tobacco use     Past Medical History  Diagnosis Date  . Asthma   . Diabetes mellitus without complication    History reviewed. No pertinent past surgical history. No family history on file. History  Substance Use Topics  . Smoking status: Current Every Day Smoker    Types: Cigarettes  . Smokeless tobacco: Not on file  . Alcohol Use: No   OB History   Grav Para Term Preterm Abortions TAB SAB Ect Mult Living                 Review of Systems  Constitutional: Negative for fever.  HENT: Negative for congestion, sore throat and rhinorrhea.   Respiratory: Positive for cough and shortness of breath.   Cardiovascular: Negative for chest pain.  Gastrointestinal: Negative for nausea, vomiting, abdominal pain and diarrhea.  Genitourinary: Negative for dysuria and hematuria.  Skin: Negative for rash.  Neurological: Negative for syncope, light-headedness and headaches.  All other systems reviewed and are negative.    Allergies  Review of patient's allergies indicates no known allergies.  Home Medications   Current Outpatient Rx  Name  Route  Sig  Dispense  Refill   . acetaminophen (TYLENOL) 500 MG tablet   Oral   Take 500 mg by mouth every 6 (six) hours as needed for pain.          . furosemide (LASIX) 20 MG tablet   Oral   Take 1 tablet (20 mg total) by mouth 2 (two) times daily.   8 tablet   0   . ibuprofen (ADVIL,MOTRIN) 200 MG tablet   Oral   Take 400 mg by mouth every 6 (six) hours as needed for pain.          Marland Kitchen insulin aspart (NOVOLOG) 100 UNIT/ML injection   Subcutaneous   Inject 12 Units into the skin 3 (three) times daily before meals.         . insulin glargine (LANTUS) 100 UNIT/ML injection   Subcutaneous   Inject 20 Units into the skin daily.          BP 110/66  Pulse 132  Temp(Src) 98.4 F (36.9 C) (Oral)  Resp 23  SpO2 100% Physical Exam  Nursing note and vitals reviewed. Constitutional: She is oriented to person, place, and time. She appears well-developed and well-nourished.  HENT:  Head: Normocephalic and atraumatic.  Right Ear: External ear normal.  Left Ear: External ear normal.  Eyes: EOM are normal. Pupils are equal, round, and reactive to light.  Neck: Normal range of motion. Neck supple.  Cardiovascular: Regular rhythm, normal heart sounds and intact distal pulses.  Exam reveals no gallop and no friction rub.  No murmur heard. tachycardic  Pulmonary/Chest: No respiratory distress. She has no wheezes. She has rales (course breath sounds on right>Left). She exhibits no tenderness.  tachypnea  Abdominal: Soft. Bowel sounds are normal. She exhibits no distension. There is no tenderness. There is no rebound.  Musculoskeletal: Normal range of motion. She exhibits no edema and no tenderness.  Lymphadenopathy:    She has no cervical adenopathy.  Neurological: She is alert and oriented to person, place, and time.  Skin: Skin is warm. No rash noted.  Psychiatric: She has a normal mood and affect. Her behavior is normal.    ED Course  Procedures (including critical care time) Labs Review Labs  Reviewed  GLUCOSE, CAPILLARY - Abnormal; Notable for the following:    Glucose-Capillary >600 (*)    All other components within normal limits  URINALYSIS, ROUTINE W REFLEX MICROSCOPIC - Abnormal; Notable for the following:    APPearance CLOUDY (*)    Glucose, UA >1000 (*)    Ketones, ur >80 (*)    All other components within normal limits  KETONES, QUALITATIVE - Abnormal; Notable for the following:    Acetone, Bld MODERATE (*)    All other components within normal limits  CBC WITH DIFFERENTIAL - Abnormal; Notable for the following:    WBC 16.2 (*)    RBC 5.32 (*)    Hemoglobin 16.9 (*)    HCT 50.6 (*)    Neutrophils Relative % 89 (*)    Neutro Abs 14.4 (*)    Lymphocytes Relative 5 (*)    All other components within normal limits  COMPREHENSIVE METABOLIC PANEL - Abnormal; Notable for the following:    Chloride 85 (*)    CO2 8 (*)    Glucose, Bld 823 (*)    BUN 35 (*)    Creatinine, Ser 1.12 (*)    Total Protein 8.5 (*)    Alkaline Phosphatase 128 (*)    GFR calc non Af Amer 65 (*)    GFR calc Af Amer 76 (*)    All other components within normal limits  URINE MICROSCOPIC-ADD ON - Abnormal; Notable for the following:    Squamous Epithelial / LPF FEW (*)    All other components within normal limits  GLUCOSE, CAPILLARY - Abnormal; Notable for the following:    Glucose-Capillary 566 (*)    All other components within normal limits  GLUCOSE, CAPILLARY - Abnormal; Notable for the following:    Glucose-Capillary 520 (*)    All other components within normal limits  GLUCOSE, CAPILLARY - Abnormal; Notable for the following:    Glucose-Capillary 451 (*)    All other components within normal limits  CBC  BASIC METABOLIC PANEL  BASIC METABOLIC PANEL  BASIC METABOLIC PANEL  BASIC METABOLIC PANEL  INFLUENZA PANEL BY PCR  LIPASE, BLOOD  PREGNANCY, URINE  D-DIMER, QUANTITATIVE   Imaging Review Dg Chest Port 1 View  06/05/2013   CLINICAL DATA:  Cough, shortness of breath and  chest pain.  EXAM: PORTABLE CHEST - 1 VIEW  COMPARISON:  CHEST x-ray 11/22/2011.  FINDINGS: Lung volumes are normal. No consolidative airspace disease. No pleural effusions. No pneumothorax. No pulmonary nodule or mass noted. Pulmonary vasculature and the cardiomediastinal silhouette are within normal limits.  IMPRESSION: 1. No radiographic evidence of acute cardiopulmonary disease.   Electronically Signed   By: Trudie Reed M.D.   On: 06/05/2013 19:26    MDM   1. Hyperglycemia   2. DKA (diabetic ketoacidoses)  6:34 PM Pt is a 32 y.o. female with pertinent PMHX of asthma, diabetes on Lantus and Humalog who presents to the ED with shortness of breath and hyperglycemia. Pt presented with shortness of breath since last night, cough for 2 days. Deneis fevers, endorses cough, denies congestion, denies runny nose. No sick contacts. Pt on Lantus 20 units at night. Pt supposed to be on NovoLog however has been out for several months. Denies sick contacts. Endorses 6-7 times of non-bilious, non-bloody emesis. No diarrhea. Decreased PO intake, not missed any doses of Lantus. Blood Sugars have been 300s on average. Pt transferred from Urgent Care. Pt has poorly controlled DM  PE: No/history of immobilization, not on OCPs, not tachycardic, not hypoxic. No/history of PE/DVT/ No/hsitory of cancer actively treated. Low risk for PE  History of asthma: no history of need for ICU admission, no history of need for intubation for asthma.  On exam: afebrile, tachycardic, tachypnea. Lungs course breath sounds on right>left. Concerning clinical picture for DKA, possible PNA. Likely illness pushed pt into DKA. Plan for CBC, CMP, serum ketone, UA, POCT glucose. 2 L of fluids and will start insulin drip. Review of labs from Urgent Care: Gap noted.  EKG personally reviewed by myself showed sinus tach, prolonged QT Rate of 128, PR , QRS 87ms QT/QTC 354/537ms, normal axis, without evidence of new ischemia. Comparison  showed NSR, indication: shortness of breath   CXR PA/LAT for shortness of breath per my read showed no pneumonia, ptx  Review of labs: POCT glucose >600, UA showed ketones. CMP showed glucose 823, Gap noted. Bicarb of 8. CBc showed leukocytosis 16.2, H&H 16.950.6. Serum ketones positive. Insuling drip started with sugars coming down to 566->520->451  Plan to consult family medicine for admission for DKA  The patient appears reasonably stabilized for admission considering the current resources, flow, and capabilities available in the ED at this time, and I doubt any other [facility] requiring further screening and/or treatment in the ED prior to admission.   Plan for admission to [facility] for further evaluation and management. PT transferred to floor VSS.  Labs, EKG and imaging reviewed by myself and considered in medical decision making if ordered.  Imaging interpreted by radiology. Pt was discussed with my attending, Dr. Marlene Bast, MD 06/05/13 [phone]

## 2013-06-05 NOTE — ED Notes (Signed)
Report called to Chris, ED Charge RN. 

## 2013-06-05 NOTE — ED Notes (Signed)
Glucose 566. Insulin drip infusing at 5.1 units/ml/hr to LAC PIV.

## 2013-06-05 NOTE — ED Notes (Signed)
Per [facility] pt comes from [facility]. Where she went today for cough, sob. Initial oxygen sats 88% on RA, is on NRB and has not been able to come off. HR 140. Is diabetic and has not been checking sugars. At [facility] CBG > 700. Getting breathing treatment at this time 5 mg albuterol, 0.5 atrovent. She did take her Lantus today. Pt skin is dry.

## 2013-06-05 NOTE — ED Notes (Addendum)
Pt placed on NRB mask @ 10Lpm (maintaining spO2 between 96-99%); cardiac monitor (HR 131).

## 2013-06-05 NOTE — ED Provider Notes (Signed)
I saw and evaluated this patient in conjunction with Dr. Modesto Charon.  She is tachypneic with respirations, but mentating well. She is tachycardic. Her abdomen is benign. Her lungs are clear. She is oxygenating well. Her anion gap is is elevated. She is acidotic with a bicarbonate of 11. Insulin drip has been started. She was given additional fluid bolus and continues on IV fluids. Placed a call to place the hospitalist regarding admission.   Claudean Kinds, MD 06/05/13 2041

## 2013-06-05 NOTE — ED Notes (Signed)
Admitting physician at bedside

## 2013-06-05 NOTE — ED Notes (Signed)
Reports cough x 2 days; started with SOB last night.   RR=28 at present; rales throughout bilat lungs.  Has not been checking blood sugars; took her normal 20 units Lantus today around lunchtime.  HR 140s and thready to palpation.  Placed on NRB with SaO2 up to 98-99%.  IV established.

## 2013-06-05 NOTE — ED Provider Notes (Signed)
Kristine Allison is a 32 y.o. female who presents to Urgent Care today for severe respiratory distress. Patient developed cough and shortness of breath starting yesterday. The shortness of breath has progressively worsened recently. She denies any fevers nausea or vomiting. She notes that she has been out of her NovoLog but has been taking her Lantus. She has had increased urination recently. She has not been checking her blood sugars recently. She has a history of asthma but denies any wheezing.    Past Medical History  Diagnosis Date  . Asthma   . Diabetes mellitus without complication    History  Substance Use Topics  . Smoking status: Current Every Day Smoker    Types: Cigarettes  . Smokeless tobacco: Not on file  . Alcohol Use: No   ROS as above Medications reviewed. Current Facility-Administered Medications  Medication Dose Route Frequency Provider Last Rate Last Dose  . 0.9 %  sodium chloride infusion   Intravenous Once Rodolph Bong, MD       Current Outpatient Prescriptions  Medication Sig Dispense Refill  . insulin glargine (LANTUS) 100 UNIT/ML injection Inject 20 Units into the skin daily.      Marland Kitchen acetaminophen (TYLENOL) 500 MG tablet Take 500 mg by mouth every 6 (six) hours as needed for pain.       . furosemide (LASIX) 20 MG tablet Take 1 tablet (20 mg total) by mouth 2 (two) times daily.  8 tablet  0  . ibuprofen (ADVIL,MOTRIN) 200 MG tablet Take 400 mg by mouth every 6 (six) hours as needed for pain.       Marland Kitchen insulin aspart (NOVOLOG) 100 UNIT/ML injection Inject 12 Units into the skin 3 (three) times daily before meals.        Exam:  BP 117/79  Pulse 131  Temp(Src) 96.7 F (35.9 C) (Oral)  Resp 26  SpO2 98% Prior to oxygen therapy patient's oxygen saturation was 89%. Patient was placed on a nonrebreather Gen: Patient is in respiratory distress and emaciated appearing.  Lungs: Increased worker breathing. Rhonchorous breath sounds and crackles on the left.  Crackles base of right Heart: Tachycardia but regular Abd: NABS, NT, ND Exts: Non edematous BL  LE  Results for orders placed during the hospital encounter of 06/05/13 (from the past 24 hour(s))  POCT I-STAT, CHEM 8     Status: Abnormal   Collection Time    06/05/13  5:59 PM      Result Value Range   Sodium 132 (*) 135 - 145 mEq/L   Potassium 5.1  3.5 - 5.1 mEq/L   Chloride 103  96 - 112 mEq/L   BUN 33 (*) 6 - 23 mg/dL   Creatinine, Ser 2.13 (*) 0.50 - 1.10 mg/dL   Glucose, Bld >086 (*) 70 - 99 mg/dL   Calcium, Ion 5.78  4.69 - 1.23 mmol/L   TCO2 11  0 - 100 mmol/L   Hemoglobin 19.0 (*) 12.0 - 15.0 g/dL   HCT 62.9 (*) 52.8 - 41.3 %   Comment NOTIFIED PHYSICIAN     No results found.  Assessment and Plan: 32 y.o. female with respiratory distress and probable diabetic ketoacidosis.  Patient has been out of her NovoLog and has a history of poorly controlled diabetes.  Her lung exam is concerning for pneumonia. She may also be septic.  We have started an IV and will give 500 mL bolus.  Patient is on oxygen therapy and currently stable.  Transfer to the  emergency room via EMS ASAP.     Rodolph Bong, MD 06/05/13 [phone]

## 2013-06-06 LAB — BASIC METABOLIC PANEL
BUN: 28 mg/dL — ABNORMAL HIGH (ref 6–23)
BUN: 24 mg/dL — ABNORMAL HIGH (ref 6–23)
BUN: 25 mg/dL — ABNORMAL HIGH (ref 6–23)
BUN: 19 mg/dL (ref 6–23)
CO2: 11 mEq/L — ABNORMAL LOW (ref 19–32)
CO2: 15 mEq/L — ABNORMAL LOW (ref 19–32)
Calcium: 9.1 mg/dL (ref 8.4–10.5)
Calcium: 9 mg/dL (ref 8.4–10.5)
Calcium: 9.2 mg/dL (ref 8.4–10.5)
Calcium: 8.9 mg/dL (ref 8.4–10.5)
Calcium: 8.8 mg/dL (ref 8.4–10.5)
Chloride: 101 mEq/L (ref 96–112)
Creatinine, Ser: 0.81 mg/dL (ref 0.50–1.10)
Creatinine, Ser: 0.65 mg/dL (ref 0.50–1.10)
Creatinine, Ser: 0.68 mg/dL (ref 0.50–1.10)
GFR calc Af Amer: >90 mL/min (ref >90)
GFR calc Af Amer: >90 mL/min (ref >90)
GFR calc Af Amer: >90 mL/min (ref >90)
GFR calc non Af Amer: >90 mL/min (ref >90)
GFR calc non Af Amer: >90 mL/min (ref >90)
GFR calc non Af Amer: >90 mL/min (ref >90)
GFR calc non Af Amer: >90 mL/min (ref >90)
Glucose, Bld: 232 mg/dL — ABNORMAL HIGH (ref 70–99)
Glucose, Bld: 246 mg/dL — ABNORMAL HIGH (ref 70–99)
Glucose, Bld: 112 mg/dL — ABNORMAL HIGH (ref 70–99)
Potassium: 4.4 mEq/L (ref 3.5–5.1)
Potassium: 3.5 mEq/L (ref 3.5–5.1)
Sodium: 135 mEq/L (ref 135–145)
Sodium: 132 mEq/L — ABNORMAL LOW (ref 135–145)
Sodium: 135 mEq/L (ref 135–145)

## 2013-06-06 LAB — CBC WITH DIFFERENTIAL/PLATELET
Basophils Absolute: 0 10*3/uL (ref 0.0–0.1)
Basophils Relative: 0 % (ref 0–1)
Eosinophils Relative: 0 % (ref 0–5)
HCT: 40.8 % (ref 36.0–46.0)
Lymphocytes Relative: 15 % (ref 12–46)
MCHC: 34.1 g/dL (ref 30.0–36.0)
Monocytes Absolute: 1 10*3/uL (ref 0.1–1.0)
Neutro Abs: 7.5 10*3/uL (ref 1.7–7.7)
Platelets: 258 10*3/uL (ref 150–400)
RDW: 13.9 % (ref 11.5–15.5)
WBC: 10 10*3/uL (ref 4.0–10.5)

## 2013-06-06 LAB — GLUCOSE, CAPILLARY
Glucose-Capillary: 251 mg/dL — ABNORMAL HIGH (ref 70–99)
Glucose-Capillary: 253 mg/dL — ABNORMAL HIGH (ref 70–99)
Glucose-Capillary: 212 mg/dL — ABNORMAL HIGH (ref 70–99)
Glucose-Capillary: 245 mg/dL — ABNORMAL HIGH (ref 70–99)
Glucose-Capillary: 327 mg/dL — ABNORMAL HIGH (ref 70–99)
Glucose-Capillary: 193 mg/dL — ABNORMAL HIGH (ref 70–99)
Glucose-Capillary: 148 mg/dL — ABNORMAL HIGH (ref 70–99)
Glucose-Capillary: 161 mg/dL — ABNORMAL HIGH (ref 70–99)
Glucose-Capillary: 116 mg/dL — ABNORMAL HIGH (ref 70–99)
Glucose-Capillary: 273 mg/dL — ABNORMAL HIGH (ref 70–99)
Glucose-Capillary: 282 mg/dL — ABNORMAL HIGH (ref 70–99)

## 2013-06-06 LAB — CBC
HCT: 44.4 % (ref 36.0–46.0)
MCH: 30.9 pg (ref 26.0–34.0)
MCV: 91.4 fL (ref 78.0–100.0)
Platelets: 232 10*3/uL (ref 150–400)
RBC: 4.86 MIL/uL (ref 3.87–5.11)
WBC: 12.9 10*3/uL — ABNORMAL HIGH (ref 4.0–10.5)

## 2013-06-06 LAB — HEMOGLOBIN A1C
Hgb A1c MFr Bld: 14.5 % — ABNORMAL HIGH
Mean Plasma Glucose: 369 mg/dL — ABNORMAL HIGH

## 2013-06-06 LAB — ALKALINE PHOSPHATASE: Alkaline Phosphatase: 79 U/L (ref 39–117)

## 2013-06-06 LAB — D-DIMER, QUANTITATIVE: D-Dimer, Quant: <0.27 ug/mL-FEU (ref 0.00–0.48)

## 2013-06-06 LAB — INFLUENZA PANEL BY PCR (TYPE A & B): H1N1 flu by pcr: NOT DETECTED

## 2013-06-06 MED ORDER — DIPHENHYDRAMINE HCL 25 MG PO CAPS
25.0000 mg | ORAL_CAPSULE | Freq: Once | ORAL | Status: AC
  Administered 2013-06-06: 25 mg via ORAL
  Filled 2013-06-06: qty 1

## 2013-06-06 MED ORDER — ACETAMINOPHEN 500 MG PO TABS
500.0000 mg | ORAL_TABLET | Freq: Four times a day (QID) | ORAL | Status: DC | PRN
  Administered 2013-06-07: 500 mg via ORAL
  Filled 2013-06-06: qty 1

## 2013-06-06 MED ORDER — POTASSIUM CHLORIDE 10 MEQ/100ML IV SOLN
10.0000 meq | INTRAVENOUS | Status: AC
  Administered 2013-06-06 (×2): 10 meq via INTRAVENOUS
  Filled 2013-06-06 (×2): qty 100

## 2013-06-06 MED ORDER — SODIUM CHLORIDE 0.9 % IV SOLN
INTRAVENOUS | Status: DC
  Administered 2013-06-06 (×2): 5.1 [IU]/h via INTRAVENOUS
  Administered 2013-06-06 (×2): 5.6 [IU]/h via INTRAVENOUS
  Filled 2013-06-06: qty 1

## 2013-06-06 MED ORDER — SODIUM CHLORIDE 0.9 % IV SOLN
INTRAVENOUS | Status: DC
  Administered 2013-06-06 – 2013-06-07 (×3): via INTRAVENOUS

## 2013-06-06 MED ORDER — ALBUTEROL SULFATE (5 MG/ML) 0.5% IN NEBU: 2.5000 mg | INHALATION_SOLUTION | Freq: Four times a day (QID) | RESPIRATORY_TRACT | Status: DC | PRN

## 2013-06-06 MED ORDER — INSULIN ASPART PROT & ASPART (70-30 MIX) 100 UNIT/ML ~~LOC~~ SUSP
20.0000 [IU] | Freq: Two times a day (BID) | SUBCUTANEOUS | Status: DC
  Administered 2013-06-06: 20 [IU] via SUBCUTANEOUS
  Filled 2013-06-06: qty 10

## 2013-06-06 MED ORDER — INSULIN ASPART 100 UNIT/ML ~~LOC~~ SOLN
0.0000 [IU] | SUBCUTANEOUS | Status: DC
  Administered 2013-06-06 (×2): 5 [IU] via SUBCUTANEOUS
  Administered 2013-06-07 (×2): 2 [IU] via SUBCUTANEOUS
  Administered 2013-06-07: 3 [IU] via SUBCUTANEOUS

## 2013-06-06 NOTE — H&P (Signed)
I have seen and examined this patient. I have discussed with Dr Birdie Sons.  I agree with their findings and plans as documented in their admission note.  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  Recommend that patient use NPH insulin as her basal insulin rather than Lantus because of significant cost reduction in using this twice a day basal therapy.

## 2013-06-06 NOTE — Progress Notes (Signed)
UR Completed.  Kristine Allison Jane [phone] 06/06/2013  

## 2013-06-06 NOTE — Progress Notes (Signed)
Family Medicine Teaching Service Daily Progress Note Intern Pager: [phone]  Patient name: Kristine Allison Medical record number: [id number] Date of birth: [birth year] Age: 32 y.o. Gender: female  Primary Care Provider: Janit Pagan, MD Consultants: None Code Status: Full  Pt Overview and Major Events to Date:  09/14- admitted in DKA  Assessment and Plan: Kristine Allison is a 32 y.o. female presenting in DKA with CBG 832, gap of 42 . PMH is significant for asthma and poorly controlled DM (on lantus and humalog).   #DKA- Patient with hx of very poorly controlled DM at home. Is unable to afford meds and has only been sharing lantus doses with mother. CBGs> 500 at home on a regular basis. Likely exacerbated in setting of acute infection (appears to be pulm in origin vs upper respiratory). Gap 42, bicarb 8, CBG initially 862, improved rapidly with insulin drip overnight. CBG this am 148, AG 13. Symptomatically much better. Now on D51/2NS, with titrated insulin drip. Will wait until gap closed X2 and start Lantus or NPH pending patient's ability to take good PO at home.Will definitely need outpt coordination of obtaining medications, with adequate follow up given complex social situation  Plan:  -D51/2NS then switch to NS with sq insulin once gap closed X2  -insulin continuous infusion 0.1u/kg/hr with desired decrease in glucose by 50-75mg /dk/hr; plan to switch to either NPH(.2Ukg/hr or lantus(0.5U/kg)  -monitor for closing of gap, improvement of acidosis -repletion of K, current BMET with 4.9 (but falsely normal given DKA, expect drop as k driven back into cells with insulin/rehydration)  S/p 5 runs of -monitoring of mental status  -cbgs q1  -pulse ox, vitals q4  -trend BMETs q2  -strict is/os  -hypoglycemic protocol  -social work consult   #Epigastric suprapubic pain/GERD- likely related to DKA vs GERD vs pancreatitis vs UTI. Patient with hx of GERD and attesting to  sour taste in mouth. Has had regular BMs (every 2 days at patient's baseline) and still passing gas. Pain also diffuse and located across suprapubic region. No dysuria, UA without signs of infection making UTI less likely. Also unlikely pancreatitis now with neg lipase. Appears much improved this am Plan:  -protonix  -GI cocktail  -serial abdominal exams   #SOB/malaise- Likely 2/2 to above but also with concern for acute infection given lung exam which was sig for coarse breath sounds in bilateral bases and white count vs PE vs asthma exacerbation. Of note patient received flu vax approx month ago but given poorly controlled DM is technically immunocompromised. Pt also attesting to pain with deep breaths although it appears that pain is mostly located in the epigastric region. D-dimer neg. Patient with hx of asthma and tobacco use although lungs not diffusely wheezy this am. More concerning for URI vs pna. CXR without evidence of infiltrate.  Plan  -treat DKA as above  -influenza by pcr- given general malaise -trend CBCs  -d-dimer neg -albuterol inhaler and neb prn  -pulse ox, vitals   # AKI: Cr of 1.3 in the ED, improved to 1.12 on admission following some rehydration. Now 0.65 most likely 2/2 to DKA/dehydration picture Plan: - will continue to follow on BMET  - rehydrate per above   # Elevated Alk Phos: to 128. Previous value 67. Normal AST and ALT make gall stones at this time unlikely. Abdominal pain resolved with resolution of DKA Plan: - repeat CMET this am - consideration of GGT to evaluate for biliary tree cause  - consideration  of  abd [place] to evaluate for cause   FEN/GI: rehydration per DKA protocol,replete electrolytes prn (potassium ), NPO  Prophylaxis: heparin sq  Disposition: pending resolution of gap, symptomatic improvement, transition to PO regimen and SW consult  Subjective: Feels much better this am with regards to resp and gi status. Slept well. No acute  pain  Objective: Temp:  [96.7 F (35.9 C)-98.7 F (37.1 C)] 98.7 F (37.1 C) (09/15 0445) Pulse Rate:  [95-144] 95 (09/15 0445) Resp:  [18-26] 20 (09/15 0445) BP: (100-139)/(58-80) 100/65 mmHg (09/15 0445) SpO2:  [89 %-100 %] 97 % (09/15 0445) Weight:  [106 lb 7.7 oz (48.3 kg)] 106 lb 7.7 oz (48.3 kg) (09/14 2332) Physical Exam: General: NAD, sleeping in bed Cardiovascular: tachycardic RRR, no murmurs rubs or gallops Respiratory: coarse breath sounds in right base, no wheezes, no increase WOB sating 97% on RA Abdomen: soft,nondistended, mildly tender in the epigastric region, normoactive bowels Extremities: warm and well perfused   Laboratory:  Recent Labs Lab 06/05/13 0120 06/05/13 1759 06/05/13 1936 06/06/13 0430  WBC 12.9*  --  16.2* 10.0  HGB 15.0 19.0* 16.9* 13.9  HCT 44.4 56.0* 50.6* 40.8  PLT 232  --  347 258    Recent Labs Lab 06/05/13 1936 06/06/13 0430 06/06/13 0525  NA 135 135 134*  K 5.0 5.0 4.9  CL 85* 103 103  CO2 8* 15* 18*  BUN 35* 25* 24*  CREATININE 1.12* 0.68 0.65  CALCIUM 10.4 9.2 9.0  PROT 8.5*  --   --   BILITOT 0.5  --   --   ALKPHOS 128*  --   --   ALT 13  --   --   AST 10  --   --   GLUCOSE 823* 246* 232*   CBG (last 3)   Recent Labs  06/06/13 0543 06/06/13 0649 06/06/13 0748  GLUCAP 193* 162* 148*     Imaging/Diagnostic Tests: CXR-06/05/13 no acute process    Anselm Lis, MD 06/06/2013, 8:37 AM PGY-1, [facility] FPTS Intern pager: [phone], text pages welcome

## 2013-06-06 NOTE — Progress Notes (Signed)
PCP Social Note: Al Decant Davaun Quintela,MD   Ms Jearld Adjutant is newly appointed to me, I went to meet her for the first time today but she was sleepy and not ready to communicate with me.I reviewed her chart, I appreciate the care provided by the FMTS team, I will follow up with patient at the clinic after she is discharged from the hospital.

## 2013-06-06 NOTE — Progress Notes (Signed)
Family Medicine Teaching Service Daily Progress Note Intern Pager: [phone]  Patient name: Kristine Allison Medical record number: [id number] Date of birth: April 15, 1981 Age: 32 y.o. Gender: female  Primary Care Provider: Janit Pagan, MD Consultants: None Code Status: Full  Pt Overview and Major Events to Date:  09/14- admitted in DKA  Assessment and Plan: Kristine Allison is a 32 y.o. female presenting in DKA with CBG 832, gap of 42 . PMH is significant for asthma and poorly controlled DM (on lantus and humalog).   #DKA- Patient with hx of very poorly controlled DM at home. Is unable to afford meds and has only been sharing lantus doses with mother. CBGs> 500 at home on a regular basis. Likely exacerbated in setting of acute infection (appears to be pulm in origin vs upper respiratory). Gap 42, bicarb 8, CBG initially 862, improved rapidly with insulin drip. Transitioned to 70/30 yesterday (20/20) with SSI for coverage. CBGs [id number]. Gap remains closed patient able to tolerate dose, required 15 additional units. CM to help discuss options for affording med Plan:  -70/30 mix-> increased am dose to 35, pm remain at 20 -gap 8 this am -cbgs with meals -CM for medication assistance  -diabetic diet, monitor for toleration this am  #Epigastric suprapubic pain/GERD- likely related to DKA vs GERD vs pancreatitis vs UTI. Patient with hx of GERD and attesting to sour taste in mouth. Has had regular BMs (every 2 days at patient's baseline) and still passing gas. Appears resolved with resolution of DKA  Plan:  -protonix  -GI cocktail   #SOB/malaise- Likely 2/2 to above but also with concern for URI vs flu vs asthma. Flu pcr neg. D-dimer neg. Patient with hx of asthma and tobacco use lungs rhochorous and wheezy this am. CXR without evidence of infiltrate. Afebrile, without white count. Plan  -albuterol inhaler and neb prn  -pulse ox, vitals  -rehydration with orals  # AKI: resolved,  Cr of 1.3 in the ED, improved to 1.12 on admission following some rehydration. Now 0.61 most likely 2/2 to DKA/dehydration picture Plan: -cont oral rehydration  # Elevated Alk Phos: resolved, initially to 128; 79 yesterday.Normal AST and ALT make gall stones at this time unlikely. Abdominal pain resolved with resolution of DKA Plan: -outpt f/up  FEN/GI: gen diet Prophylaxis: heparin sq  Disposition: pending case management assistance, toleration of orals today; possible home today  Subjective: Still sleepy this am but overall much improved. No acute pain  Objective: Temp:  [97.9 F (36.6 C)-98.7 F (37.1 C)] 97.9 F (36.6 C) (09/15 2039) Pulse Rate:  [87-95] 87 (09/15 2039) Resp:  [18-20] 18 (09/15 2039) BP: (94-100)/(43-65) 94/56 mmHg (09/15 2039) SpO2:  [92 %-97 %] 92 % (09/15 2039) Physical Exam: General: NAD, sleeping in bed Cardiovascular: RRR, no murmurs rubs or gallops Respiratory: coarse breath sounds in right base, mild diffuse wheezes, sating 92 on RA. No increased WOB Abdomen: soft,nondistended, nontender normoactive bowels Extremities: warm and well perfused   Laboratory:  Recent Labs Lab 06/05/13 0120 06/05/13 1759 06/05/13 1936 06/06/13 0430  WBC 12.9*  --  16.2* 10.0  HGB 15.0 19.0* 16.9* 13.9  HCT 44.4 56.0* 50.6* 40.8  PLT 232  --  347 258    Recent Labs Lab 06/05/13 1936  06/06/13 0525 06/06/13 1125 06/06/13 1900  NA 135  < > 134* 132* 135  K 5.0  < > 4.9 4.4 3.5  CL 85*  < > 103 103 105  CO2 8*  < >  18* 19 20  BUN 35*  < > 24* 19 19  CREATININE 1.12*  < > 0.65 0.57 0.68  CALCIUM 10.4  < > 9.0 8.9 8.8  PROT 8.5*  --   --   --   --   BILITOT 0.5  --   --   --   --   ALKPHOS 128*  --   --  79  --   ALT 13  --   --   --   --   AST 10  --   --   --   --   GLUCOSE 823*  < > 232* 112* 318*  < > = values in this interval not displayed. CBG (last 3)   Recent Labs  06/06/13 1205 06/06/13 1602 06/06/13 2034  GLUCAP 92 273* 282*      Imaging/Diagnostic Tests: CXR-06/05/13 no acute process    Anselm Lis, MD 06/06/2013, 11:46 PM PGY-1, [facility] Family Medicine FPTS Intern pager: [phone], text pages welcome

## 2013-06-06 NOTE — Progress Notes (Signed)
I spoke with Dr. Michail Jewels regarding order for multiplier of .005. Per glucose stabilizer program multiplier will be set to .02.  New insulin drip rate 3units/hr based on CBG of 212.  OK with Dr. Michail Jewels.  IV fluids changed to D5 1/2 NS.  KCl infusion going in at 50 ml/hr due to pt discomfort. Pt is resting comfortably now.

## 2013-06-06 NOTE — Discharge Summary (Signed)
Family Medicine Teaching [facility] Discharge Summary  Patient name: Kristine Allison Medical record number: [id number] Date of birth: 1981/04/01 Age: 32 y.o. Gender: female Date of Admission: 06/05/2013  Date of Discharge: 06/07/13 Admitting Physician: Leighton Roach McDiarmid, MD  Primary Care Provider: Janit Pagan, MD Consultants: None  Indication for Hospitalization: DKA  Discharge Diagnoses/Problem List:  DKA DMII GERD  Disposition: dispo to home  Discharge Condition: stable  Brief Hospital Course:  Kristine Allison is a 32 y.o. female who presented in DKA with CBG 832, gap of 42 . PMH is significant for asthma and poorly controlled DM (on lantus and humalog but rarely takes either).  #DKA- Initially presented with SOB and general malaise for 1.5 days PTA. SOB and nonproductive cough prompted pt to present to urgent care where patient was noted to be in DKA, and was transferred to the ED for further eval. Endorsed sharing lantus with mother and not taking novolog for 2 months 2/2 to cost issues. In ED patient noted to be tachycardic and tachypnec, labs consistent with DKA POCG 862, AG42, urine ketones >80. Patient given 2L bolus started on insulin drip. CXR revealed no acute cardiopulm process. It was felt DKA precipitated by URI vs viral or atypical pna vs flu. Pt was admitted for DKA protocol. Gap closed quickly with the two bag method. Patient was symptomatically improved with continual resolution of DKA.  Potassium was repleted X5 runs. Once gap was closed X2 and CBGs were less than 250s patient was transitioned over to Sq insulin (70/30 mix) and rehydrated with NS. It was felt that patient would be more compliant with 70/30 given cheaper cost. Patient initially started on 20Uam 20U pm with SSI. Diet was slowly advance as tolerated. Required 15U additionally 09/15. Morning of d/c patient increased to 35U in am, causing decrease in CBGs to 61 and then 90 after small snack.  Morning dose then changed back to 25U to avoid future hypoglycemic events. Patient was tolerating orals well on morning of d/c. CM was consulted to help with assistance of affording medications and application for medicaid. Patient was d/c on 25Uam 20Upm of 70/30. Felt that she could manage the cost of these meds at home. She was instructed to f/up closely with PCP Dr. Lum Babe and counseled extensively on hypoglyemic symptoms/management.   #Epigastric suprapubic pain/GERD- Patient initially c/o diffuse abdominal discomfort. It was felt that these pains were likely related to DKA vs GERD vs pancreatitis vs UTI. Patient with hx of GERD and attesting to sour taste in mouth on a regular basis. Had regular BMs (every 2 days at patient's baseline) and was continuing to pass gas throughout admission. No dysuria, UA without signs of infection making UTI less likely. Also unlikely pancreatitis given location and symptoms, lipase was normal. Serial abdominal exams were normal through out admission. Patient was started on protonix and GI cocktail in house. Abdominal pain resolved with resolution of DKA. Could consider starting GERD tx as outpt.   #SOB/malaise- Patient was c/o SOB with deep inspiration and general malaise approx 2 days PTA. Was felt that this was likely 2/2 to above but also with concern for acute infection given lung exam which was sig for coarse breath sounds in bilateral bases and white count vs PE vs asthma exacerbation. CXR without evidence of infiltrate.Of note patient received flu vax approx month ago but given poorly controlled DM is technically immunocompromised. Therefore influenza by pcr was obtained which was negative. Pt was also attesting to pain  with deep breaths although it appeared that pain was mostly located in the epigastric region. Well's score was low (1.5) but d-dimer was obtained given description of sx. This was negative. Patient with hx of asthma and tobacco use and was started on  albuterol home regimen in house. White count trended down during admission, cough, sore throat seemed most consistent with URI. Patient was afebrile on d/c with improved pulm exam. Will need f/up on asthma control.   # AKI: Cr of 1.3 in the ED, improved to 1.12 on admission following some rehydration. BMETs were trended 0.68 -> 0.61. Felt that this was 2/2 to dehyration.   # Elevated Alk Phos: to 128. Previous value 67. Normal AST and ALT made gall stones at time pf admission unlikely. Further w/up was not obtained as repeat was 78. Could consider GGT or abd [place] in future if notice future bumps in Alk phos/gallbladder symptoms.   Issues for Follow Up:  1. Toleration of 70/30, ability to obtain medications 2. CBGs 3. Need for GERD medication 4. Repeat BMET  Significant Procedures: None  Significant Labs and Imaging:   Recent Labs Lab 06/05/13 1936 06/06/13 0430 06/07/13 0530  WBC 16.2* 10.0 6.6  HGB 16.9* 13.9 11.7*  HCT 50.6* 40.8 34.0*  PLT 347 258 159    Recent Labs Lab 06/05/13 1936 06/06/13 0430 06/06/13 0525 06/06/13 1125 06/06/13 1900 06/07/13 0530  NA 135 135 134* 132* 135 137  K 5.0 5.0 4.9 4.4 3.5 3.7  CL 85* 103 103 103 105 107  CO2 8* 15* 18* 19 20 22   GLUCOSE 823* 246* 232* 112* 318* 213*  BUN 35* 25* 24* 19 19 15   CREATININE 1.12* 0.68 0.65 0.57 0.68 0.61  CALCIUM 10.4 9.2 9.0 8.9 8.8 8.5  ALKPHOS 128*  --   --  79  --   --   AST 10  --   --   --   --   --   ALT 13  --   --   --   --   --   ALBUMIN 4.4  --   --   --   --   --    CBG (last 3)   Recent Labs  06/07/13 0822 06/07/13 1134 06/07/13 1156  GLUCAP 186* 61* 90     Results/Tests Pending at Time of Discharge: None  Discharge Medications:    Medication List    STOP taking these medications       insulin glargine 100 UNIT/ML injection  Commonly known as:  LANTUS      TAKE these medications       dextrose 40 % Gel  Commonly known as:  GLUTOSE  Take 37.5 g by mouth as needed (CBG  less than 70 or CBG greater than 70 and next meal is more than 1 hours away).     ibuprofen 200 MG tablet  Commonly known as:  ADVIL,MOTRIN  Take 400 mg by mouth every 6 (six) hours as needed for pain.     insulin aspart protamine- aspart (70-30) 100 UNIT/ML injection  Commonly known as:  NOVOLOG MIX 70/30  Inject 0.2 mLs (20 Units total) into the skin daily with supper.     insulin aspart protamine- aspart (70-30) 100 UNIT/ML injection  Commonly known as:  NOVOLOG MIX 70/30  Inject 0.25 mLs (25 Units total) into the skin daily with breakfast.     OVER THE COUNTER MEDICATION  Take 2 tablets by mouth 2 (two) times  daily as needed (cough and cold symptoms). Advil Cold and Cough     PROAIR HFA 108 (90 BASE) MCG/ACT inhaler  Generic drug:  albuterol  Inhale 2 puffs into the lungs 2 (two) times daily as needed for wheezing or shortness of breath.        Discharge Instructions: Please refer to Patient Instructions section of EMR for full details.  Patient was counseled important signs and symptoms that should prompt return to medical care, changes in medications, dietary instructions, activity restrictions, and follow up appointments.   Follow-Up Appointments: Follow-up Information   Follow up with Janit Pagan, MD On 06/14/2013. (9:30)    Specialty:  Family Medicine   Contact information:   [address] [place] [address] [phone]       Anselm Lis, MD 06/07/2013, 9:49 PM PGY-1, [facility] Family [facility]

## 2013-06-06 NOTE — Progress Notes (Signed)
FMTS Attending Note  I personally saw and evaluated the patient. The plan of care was discussed with the resident team. I agree with the assessment and plan as documented by the resident.   DKA - resolved, work with SW to develop insulin plan for discharge, likely to go home on Novolog 70/30 as unable to afford Lantus/Novolog AKI - resolving  Donnella Sham MD

## 2013-06-06 NOTE — Progress Notes (Signed)
Had difficulty tracking down BMET results: Received call back from pharmacy 137/4.3/101/11/280/28/0.81 AG 25 Will repeat K X2 Repeat CBG <250, spoke with Coralee North, will turn down insulin drip to half and switch to D51/2NS, appreciate excellent clinical judgement  Charlane Ferretti, MD Family Medicine PGY-1 Please page or call with questions

## 2013-06-07 DIAGNOSIS — R609 Edema, unspecified: Secondary | ICD-10-CM

## 2013-06-07 LAB — CBC WITH DIFFERENTIAL/PLATELET
Basophils Absolute: 0 10*3/uL (ref 0.0–0.1)
Basophils Relative: 0 % (ref 0–1)
Eosinophils Relative: 1 % (ref 0–5)
Lymphocytes Relative: 27 % (ref 12–46)
MCHC: 34.4 g/dL (ref 30.0–36.0)
Neutro Abs: 4.3 10*3/uL (ref 1.7–7.7)
Platelets: 159 10*3/uL (ref 150–400)
RDW: 14.1 % (ref 11.5–15.5)
WBC: 6.6 10*3/uL (ref 4.0–10.5)

## 2013-06-07 LAB — GLUCOSE, CAPILLARY: Glucose-Capillary: 247 mg/dL — ABNORMAL HIGH (ref 70–99)

## 2013-06-07 LAB — BASIC METABOLIC PANEL
BUN: 15 mg/dL (ref 6–23)
Calcium: 8.5 mg/dL (ref 8.4–10.5)
Creatinine, Ser: 0.61 mg/dL (ref 0.50–1.10)
GFR calc Af Amer: >90 mL/min (ref >90)

## 2013-06-07 MED ORDER — INSULIN ASPART PROT & ASPART (70-30 MIX) 100 UNIT/ML ~~LOC~~ SUSP
35.0000 [IU] | Freq: Every day | SUBCUTANEOUS | Status: DC
  Administered 2013-06-07: 35 [IU] via SUBCUTANEOUS
  Filled 2013-06-07: qty 10

## 2013-06-07 MED ORDER — GLUCOSE 40 % PO GEL: 1.0000 | ORAL | Status: DC | PRN

## 2013-06-07 MED ORDER — INSULIN ASPART PROT & ASPART (70-30 MIX) 100 UNIT/ML ~~LOC~~ SUSP: 25.0000 [IU] | Freq: Every day | SUBCUTANEOUS | Status: DC

## 2013-06-07 MED ORDER — INSULIN ASPART PROT & ASPART (70-30 MIX) 100 UNIT/ML ~~LOC~~ SUSP
20.0000 [IU] | Freq: Every day | SUBCUTANEOUS | Status: DC
  Filled 2013-06-07: qty 10

## 2013-06-07 MED ORDER — INSULIN ASPART PROT & ASPART (70-30 MIX) 100 UNIT/ML ~~LOC~~ SUSP: 20.0000 [IU] | Freq: Every day | SUBCUTANEOUS | Status: DC

## 2013-06-07 MED ORDER — INSULIN ASPART PROT & ASPART (70-30 MIX) 100 UNIT/ML ~~LOC~~ SUSP: 35.0000 [IU] | Freq: Every day | SUBCUTANEOUS | Status: DC

## 2013-06-07 MED ORDER — GLUCOSE-VITAMIN C 4-6 GM-MG PO CHEW: 4.0000 | CHEWABLE_TABLET | ORAL | Status: DC | PRN

## 2013-06-07 NOTE — Progress Notes (Signed)
FMTS Attending Note  I personally saw and evaluated the patient. The plan of care was discussed with the resident team. I agree with the assessment and plan as documented by the resident.   DKA - resolved, SBGs improved on Novolog 70/30, will need close outpatient management for titration of new insulin regimen.  AKI - resolved, 2/2 dehydration from DKA  Agree with discharge home today.   Donnella Sham MD

## 2013-06-07 NOTE — Progress Notes (Signed)
Pt's CBG rechecked at 1156 and found to be 90. Pt eating lunch and has no complaints or needs at this time. Will continue to monitor. Hypoglycemic protocol in place for future episodes of hypoglycemia.   Alwyn Ren, RN

## 2013-06-07 NOTE — Progress Notes (Signed)
Inpatient Diabetes Program Recommendations  AACE/ADA: New Consensus Statement on Inpatient Glycemic Control (2013)  Target Ranges:  Prepandial:   less than 140 mg/dL      Peak postprandial:   less than 180 mg/dL (1-2 hours)      Critically ill patients:  140 - 180 mg/dL   Reason for Visit: Admitted with DKA  Note: Patient being discharged on a more affordable insulin regimen than she previously had.  Explained the basic concept of 70/30 premixed insulin.  Explained the need for consistent meal times to help prevent hypoglycemia.  Gave her a sheet outlining the cost of this insulin at [facility].  Also gave her information regarding a generic glucose meter available at [facility] with test strips that are at a much lower cost than her current meter.  Patient's CBG was checked while I was in room and was 61 mg/dl.  May need reevaluation of D/C dose of 70/30.  Thank you.  Adelbert Gaspard S. Elsie Lincoln, RN, CNS, CDE Inpatient Diabetes Program, team pager [phone]

## 2013-06-07 NOTE — Progress Notes (Signed)
Pt discharged per written orders. Time spent with patient verifying her understanding and knowledge of discharge orders/instructions/follow up care/medicines/etc. Pt states she understands how to check her blood sugar, how to administer her insulin, the importance of follow up care, and what to look for for hyper/hypo glycemia and when to call the doctor or come to the ED.  Time allowed for questions/concerns. Pt states she has none at this time.   Alwyn Ren, RN

## 2013-06-07 NOTE — Progress Notes (Signed)
Pt's blood sugar found to be 61. Pt alert and oriented and has no complaints at this time other than a headache (see MAR). Crackers, OJ, and peanut butter given. Will recheck blood sugar in 15-20 minutes and monitor closely. Diabetic educator presently at bedside.   - Alwyn Ren, RN

## 2013-06-08 NOTE — ED Provider Notes (Signed)
Patient was seen and evaluated by myself. I agree with Dr. Idalia Needle assessment above. She is not using Humalog sometime use Lantus. She is quite noncompliant. Presents with a several day history of high blood sugars and now short of breath. Clinical isn't in diabetic ketoacidosis her labs confirmed this as well. He's undergoing fluid resuscitation started on insulin drip. I reevaluated her on 2 occasions. Her tachypnea is improved.  She continues to [facility] well.  CRITICAL CARE Performed by: Rolland Porter JOSEPH   Total critical care time: 30 Minutes  Critical care time was exclusive of separately billable procedures and treating other patients.  Critical care was necessary to treat or prevent imminent or life-threatening deterioration.  Critical care was time spent personally by me on the following activities: development of treatment plan with patient and/or surrogate as well as nursing, discussions with consultants, evaluation of patient's response to treatment, examination of patient, obtaining history from patient or surrogate, ordering and performing treatments and interventions, ordering and review of laboratory studies, ordering and review of radiographic studies, pulse oximetry and re-evaluation of patient's condition.   Claudean Kinds, MD 06/08/13 [phone]

## 2013-06-09 NOTE — Discharge Summary (Signed)
I agree with the discharge summary as documented.   Kyle Fletke MD  

## 2013-06-14 ENCOUNTER — Inpatient Hospital Stay: Payer: Self-pay | Admitting: Family Medicine

## 2013-07-12 ENCOUNTER — Ambulatory Visit: Payer: Self-pay | Admitting: Family Medicine

## 2013-07-26 ENCOUNTER — Inpatient Hospital Stay (HOSPITAL_COMMUNITY)
Admission: EM | Admit: 2013-07-26 | Discharge: 2013-07-29 | DRG: 637 | Disposition: A | Discharge status: Home / Self Care | Payer: Self-pay | Attending: Family Medicine | Admitting: Family Medicine

## 2013-07-26 ENCOUNTER — Emergency Department (HOSPITAL_COMMUNITY): Payer: Self-pay

## 2013-07-26 ENCOUNTER — Encounter (HOSPITAL_COMMUNITY): Payer: Self-pay | Admitting: Emergency Medicine

## 2013-07-26 DIAGNOSIS — E860 Dehydration: Secondary | ICD-10-CM | POA: Diagnosis present

## 2013-07-26 DIAGNOSIS — Z598 Other problems related to housing and economic circumstances: Secondary | ICD-10-CM

## 2013-07-26 DIAGNOSIS — E1110 Type 2 diabetes mellitus with ketoacidosis without coma: Secondary | ICD-10-CM

## 2013-07-26 DIAGNOSIS — N179 Acute kidney failure, unspecified: Secondary | ICD-10-CM | POA: Diagnosis present

## 2013-07-26 DIAGNOSIS — J45909 Unspecified asthma, uncomplicated: Secondary | ICD-10-CM | POA: Diagnosis present

## 2013-07-26 DIAGNOSIS — D72819 Decreased white blood cell count, unspecified: Secondary | ICD-10-CM | POA: Diagnosis present

## 2013-07-26 DIAGNOSIS — Z91199 Patient's noncompliance with other medical treatment and regimen due to unspecified reason: Secondary | ICD-10-CM

## 2013-07-26 DIAGNOSIS — D696 Thrombocytopenia, unspecified: Secondary | ICD-10-CM | POA: Diagnosis present

## 2013-07-26 DIAGNOSIS — R6510 Systemic inflammatory response syndrome (SIRS) of non-infectious origin without acute organ dysfunction: Secondary | ICD-10-CM

## 2013-07-26 DIAGNOSIS — Z91040 Latex allergy status: Secondary | ICD-10-CM

## 2013-07-26 DIAGNOSIS — Z9119 Patient's noncompliance with other medical treatment and regimen: Secondary | ICD-10-CM

## 2013-07-26 DIAGNOSIS — Z794 Long term (current) use of insulin: Secondary | ICD-10-CM

## 2013-07-26 DIAGNOSIS — Z79899 Other long term (current) drug therapy: Secondary | ICD-10-CM

## 2013-07-26 DIAGNOSIS — Z5987 Material hardship due to limited financial resources, not elsewhere classified: Secondary | ICD-10-CM

## 2013-07-26 DIAGNOSIS — J189 Pneumonia, unspecified organism: Secondary | ICD-10-CM

## 2013-07-26 DIAGNOSIS — E1010 Type 1 diabetes mellitus with ketoacidosis without coma: Principal | ICD-10-CM

## 2013-07-26 DIAGNOSIS — E119 Type 2 diabetes mellitus without complications: Secondary | ICD-10-CM

## 2013-07-26 DIAGNOSIS — E1065 Type 1 diabetes mellitus with hyperglycemia: Secondary | ICD-10-CM | POA: Diagnosis present

## 2013-07-26 DIAGNOSIS — K219 Gastro-esophageal reflux disease without esophagitis: Secondary | ICD-10-CM | POA: Diagnosis present

## 2013-07-26 DIAGNOSIS — F17200 Nicotine dependence, unspecified, uncomplicated: Secondary | ICD-10-CM | POA: Diagnosis present

## 2013-07-26 LAB — BASIC METABOLIC PANEL WITH GFR
BUN: 49 mg/dL — ABNORMAL HIGH (ref 6–23)
CO2: <7 meq/L — CL (ref 19–32)
Calcium: 9.6 mg/dL (ref 8.4–10.5)
Chloride: 78 meq/L — ABNORMAL LOW (ref 96–112)
Creatinine, Ser: 1.51 mg/dL — ABNORMAL HIGH (ref 0.50–1.10)
GFR calc Af Amer: 53 mL/min — ABNORMAL LOW
GFR calc non Af Amer: 45 mL/min — ABNORMAL LOW
Glucose, Bld: 893 mg/dL (ref 70–99)
Potassium: 6.3 meq/L (ref 3.5–5.1)
Sodium: 126 meq/L — ABNORMAL LOW (ref 135–145)

## 2013-07-26 LAB — CBC WITH DIFFERENTIAL/PLATELET
Basophils Absolute: 0 K/uL (ref 0.0–0.1)
Basophils Relative: 0 % (ref 0–1)
Eosinophils Absolute: 0 K/uL (ref 0.0–0.7)
Eosinophils Relative: 0 % (ref 0–5)
HCT: 52 % — ABNORMAL HIGH (ref 36.0–46.0)
Hemoglobin: 16.9 g/dL — ABNORMAL HIGH (ref 12.0–15.0)
Lymphocytes Relative: 9 % — ABNORMAL LOW (ref 12–46)
Lymphs Abs: 2 K/uL (ref 0.7–4.0)
MCH: 32 pg (ref 26.0–34.0)
MCHC: 32.5 g/dL (ref 30.0–36.0)
MCV: 98.5 fL (ref 78.0–100.0)
Monocytes Absolute: 1.8 K/uL — ABNORMAL HIGH (ref 0.1–1.0)
Monocytes Relative: 8 % (ref 3–12)
Neutro Abs: 18.7 K/uL — ABNORMAL HIGH (ref 1.7–7.7)
Neutrophils Relative %: 83 % — ABNORMAL HIGH (ref 43–77)
Platelets: 357 K/uL (ref 150–400)
RBC: 5.28 MIL/uL — ABNORMAL HIGH (ref 3.87–5.11)
RDW: 14.5 % (ref 11.5–15.5)
WBC: 22.5 K/uL — ABNORMAL HIGH (ref 4.0–10.5)

## 2013-07-26 LAB — BLOOD GAS, VENOUS
Acid-base deficit: 27.1 mmol/L — ABNORMAL HIGH (ref 0.0–2.0)
Bicarbonate: 4.5 meq/L — ABNORMAL LOW (ref 20.0–24.0)
O2 Saturation: 74.9 %
Patient temperature: 98.6
TCO2: 4.5 mmol/L (ref 0–100)
pCO2, Ven: 19.5 mmHg — ABNORMAL LOW (ref 45.0–50.0)
pH, Ven: 6.993 — CL (ref 7.250–7.300)
pO2, Ven: 53.4 mmHg — ABNORMAL HIGH (ref 30.0–45.0)

## 2013-07-26 LAB — URINALYSIS, ROUTINE W REFLEX MICROSCOPIC
Glucose, UA: >1000 mg/dL — AB
Hgb urine dipstick: NEGATIVE
Ketones, ur: >80 mg/dL — AB
Protein, ur: NEGATIVE mg/dL

## 2013-07-26 LAB — URINE MICROSCOPIC-ADD ON

## 2013-07-26 LAB — GLUCOSE, CAPILLARY: Glucose-Capillary: 483 mg/dL — ABNORMAL HIGH (ref 70–99)

## 2013-07-26 LAB — POCT PREGNANCY, URINE: Preg Test, Ur: NEGATIVE

## 2013-07-26 MED ORDER — INSULIN REGULAR HUMAN 100 UNIT/ML IJ SOLN
INTRAMUSCULAR | Status: DC
  Administered 2013-07-26: 5.4 [IU]/h via INTRAVENOUS
  Filled 2013-07-26: qty 1

## 2013-07-26 MED ORDER — SODIUM CHLORIDE 0.9 % IV SOLN: 1000.0000 mL | INTRAVENOUS | Status: DC

## 2013-07-26 MED ORDER — DEXTROSE 50 % IV SOLN: 25.0000 mL | INTRAVENOUS | Status: DC | PRN

## 2013-07-26 MED ORDER — DEXTROSE-NACL 5-0.45 % IV SOLN
INTRAVENOUS | Status: DC
  Administered 2013-07-27: 03:00:00 via INTRAVENOUS

## 2013-07-26 MED ORDER — HEPARIN SODIUM (PORCINE) 5000 UNIT/ML IJ SOLN
5000.0000 [IU] | Freq: Three times a day (TID) | INTRAMUSCULAR | Status: DC
  Administered 2013-07-27 – 2013-07-28 (×6): 5000 [IU] via SUBCUTANEOUS
  Filled 2013-07-26 (×8): qty 1

## 2013-07-26 MED ORDER — SODIUM CHLORIDE 0.9 % IV SOLN
INTRAVENOUS | Status: DC
  Administered 2013-07-27: 1.9 [IU]/h via INTRAVENOUS
  Administered 2013-07-27: 0.9 [IU]/h via INTRAVENOUS
  Filled 2013-07-26: qty 1

## 2013-07-26 MED ORDER — SODIUM CHLORIDE 0.9 % IV SOLN
1000.0000 mL | Freq: Once | INTRAVENOUS | Status: AC
  Administered 2013-07-26: 1000 mL via INTRAVENOUS

## 2013-07-26 MED ORDER — FENTANYL CITRATE 0.05 MG/ML IJ SOLN
25.0000 ug | Freq: Once | INTRAMUSCULAR | Status: AC
  Administered 2013-07-26: 25 ug via INTRAVENOUS
  Filled 2013-07-26: qty 2

## 2013-07-26 MED ORDER — SODIUM CHLORIDE 0.9 % IV SOLN: INTRAVENOUS | Status: DC

## 2013-07-26 NOTE — ED Notes (Signed)
Bed: ZO10 Expected date:  Expected time:  Means of arrival:  Comments: Hypotension, hyperglycemia

## 2013-07-26 NOTE — ED Notes (Signed)
[facility] called for transport. 

## 2013-07-26 NOTE — Progress Notes (Signed)
Patient confirms she does not have a pcp or insurance.  Patient reports she works at [employer] full time but does not have insurance.  [facility] asked patient if she was taking insulin, patient responded, "When I can get it."  [facility] asked patient where does she get her insulin, patient stated, "I get it here, but I did not know it was for only thirty days."  Upon record review, patient has already been initiated into the [facility] program on 06/07/2013.  Also, patient's pcp is listed as Dr. Janit Pagan and [facility].  Patient not feeling well and not very receptive to conversation with this [facility].  [facility] provided patient with a list of pcp's who accept self pay patients, list of discounted pharmacies and website needymeds.org for medication assistance,  information regarding Affordable Care Act and Medicaid for insurance,  financial assistance in the community such as local churches and [employer], crisis intervention information, and dental assistance for uninsured patients.  Also provided patient phone number to call to inqure about the orange card.  Patient also in not oppossed to [place] making an appointment at [facility] and wellness clinic.  No further needs at this time.

## 2013-07-26 NOTE — Progress Notes (Signed)
Patient transported from [facility] via [facility] on stretcher. Patient c/o abdominal pain, ST 120's, o/w vss. Oriented patient to unit and room, instructed on callbell and placed at side. No family available at this time. Patient stated that she will call her mother herself to let her know that she has moved to [facility]. IV insulin gtt infusing per order. Admitting MD paged. Will continue to monitor.

## 2013-07-26 NOTE — ED Notes (Signed)
Per EMS: pt from home, BG greater than meter. BP initial 78/58. Pt hx of DM. Pt has not got insulin filled in 1 month has been taking mothers Lantus.

## 2013-07-26 NOTE — ED Provider Notes (Signed)
CSN: [id number]     Arrival date & time 07/26/13  1841 History   First MD Initiated Contact with Patient 07/26/13 1843     Chief Complaint  Patient presents with  . Hypotension  . Hyperglycemia    HPI  Patient presents with generalized fatigue, nausea, listlessness.  She states that she has had difficulty obtaining insulin for some time.  She inconsistently takes the medication from family members. Symptoms began gradually several days ago.  Since onset symptoms have progressed.  Patient now complains of extreme listlessness, fatigue, nausea, anorexia, polyuria.  No relief with anything. No focal pain, though she does state that her stomach is upset.   Past Medical History  Diagnosis Date  . Asthma   . Diabetes mellitus without complication    History reviewed. No pertinent past surgical history. No family history on file. History  Substance Use Topics  . Smoking status: Current Every Day Smoker    Types: Cigarettes  . Smokeless tobacco: Not on file  . Alcohol Use: No   OB History   Grav Para Term Preterm Abortions TAB SAB Ect Mult Living                 Review of Systems  Constitutional:       Per HPI, otherwise negative  HENT:       Per HPI, otherwise negative  Respiratory:       Per HPI, otherwise negative  Cardiovascular:       Per HPI, otherwise negative  Gastrointestinal: Positive for nausea, vomiting and abdominal pain.  Endocrine:       Negative aside from HPI  Genitourinary:       Neg aside from HPI   Musculoskeletal:       Per HPI, otherwise negative  Skin: Negative.   Neurological: Negative for syncope.    Allergies  Latex  Home Medications   Current Outpatient Rx  Name  Route  Sig  Dispense  Refill  . albuterol (PROAIR HFA) 108 (90 BASE) MCG/ACT inhaler   Inhalation   Inhale 2 puffs into the lungs 2 (two) times daily as needed for wheezing or shortness of breath.         Marland Kitchen ibuprofen (ADVIL,MOTRIN) 200 MG tablet   Oral   Take 400 mg by  mouth every 6 (six) hours as needed for pain.          Marland Kitchen insulin aspart protamine- aspart (NOVOLOG MIX 70/30) (70-30) 100 UNIT/ML injection   Subcutaneous   Inject 20-25 Units into the skin 2 (two) times daily with a meal. 25 units with breakfast and 20 unit with supper         . insulin glargine (LANTUS) 100 UNIT/ML injection   Subcutaneous   Inject 20 Units into the skin once.          BP 119/65  Pulse 123  Resp 27  SpO2 100% Physical Exam  Nursing note and vitals reviewed. Constitutional: She is oriented to person, place, and time. She appears well-nourished. She appears distressed.  Very unwell appearing young female  HENT:  Head: Normocephalic and atraumatic.  Eyes: Conjunctivae and EOM are normal. Right eye exhibits no discharge. Left eye exhibits no discharge.  Neck: No tracheal deviation present.  Cardiovascular: Regular rhythm.  Tachycardia present.   Pulmonary/Chest: Accessory muscle usage present. No stridor. Tachypnea noted. She is in respiratory distress. She has decreased breath sounds.  Abdominal: She exhibits no distension. There is no hepatosplenomegaly. There is generalized  tenderness. There is no rigidity, no rebound and no guarding.  Musculoskeletal: She exhibits no edema.  Neurological: She is alert and oriented to person, place, and time. She displays no atrophy and no tremor. No cranial nerve deficit. She exhibits normal muscle tone. She displays no seizure activity. Coordination normal.  Skin: Skin is warm. She is diaphoretic. There is pallor.  Psychiatric: Her mood appears anxious. Her speech is delayed. She is withdrawn.   I reviewed the patient's medical record after the initial evaluation.  She was recently admitted for DKA.  ED Course  Procedures (including critical care time) Labs Review Labs Reviewed  URINALYSIS, ROUTINE W REFLEX MICROSCOPIC - Abnormal; Notable for the following:    Specific Gravity, Urine 1.031 (*)    Glucose, UA >1000 (*)     Bilirubin Urine MODERATE (*)    Ketones, ur >80 (*)    All other components within normal limits  GLUCOSE, CAPILLARY - Abnormal; Notable for the following:    Glucose-Capillary >600 (*)    All other components within normal limits  BLOOD GAS, VENOUS - Abnormal; Notable for the following:    pH, Ven 6.993 (*)    pCO2, Ven 19.5 (*)    pO2, Ven 53.4 (*)    Bicarbonate 4.5 (*)    Acid-base deficit 27.1 (*)    All other components within normal limits  URINE MICROSCOPIC-ADD ON  CBC WITH DIFFERENTIAL  BASIC METABOLIC PANEL  POCT PREGNANCY, URINE   Imaging Review Dg Chest Portable 1 View  07/26/2013   CLINICAL DATA:  Hypoxia, hypotension, unresponsive  EXAM: PORTABLE CHEST - 1 VIEW  COMPARISON:  Prior chest x-ray 06/05/2013  FINDINGS: The lungs are clear and negative for focal airspace consolidation, pulmonary edema or suspicious pulmonary nodule. No pleural effusion or pneumothorax. Cardiac and mediastinal contours are within normal limits. No acute fracture or lytic or blastic osseous lesions. The visualized upper abdominal bowel gas pattern is unremarkable.  IMPRESSION: No active cardiopulmonary disease.   Electronically Signed   By: Malachy Moan M.D.   On: 07/26/2013 19:48    EKG Interpretation     Ventricular Rate:  126 PR Interval:  125 QRS Duration: 87 QT Interval:  350 QTC Calculation: 507 R Axis:   87 Text Interpretation:  Sinus tachycardia Probable left atrial enlargement Borderline repolarization abnormality ST elevation, consider anterior injury Prolonged QT interval Baseline wander Abnormal ekg          Update: Initial labs notable for acidosis, with pH 6.9.  8:19 PM Patient remains tachycardic - but awake and alert She has received 4 L NS, and is now receiving insulin via drip and NS infusion.  Update: The patient appears slightly better.  Blood glucose is now measurable, though greater than 800.  I have discussed the patient's case with her primary care  team.  Patient will be transferred to our affiliated facility for further evaluation and management.    MDM   1. DKA (diabetic ketoacidoses)    this young female with insulin-dependent diabetes presents with multiple complaints, and on initial exam is clinically in a bike ketoacidosis.  Initial blood glucose that is unmeasurable, her tachycardia, tachypnea, she was started empirically with fluids, insulin drip.  Labs were notable for markedly acidosis, anion gap, hyperglycemia. Patient did improve substantially in the emergency department, but given her critical illness she required stepped on placement for further evaluation and management   CRITICAL CARE Performed by: Gerhard Munch Total critical care time: 35 Critical care time  was exclusive of separately billable procedures and treating other patients. Critical care was necessary to treat or prevent imminent or life-threatening deterioration. Critical care was time spent personally by me on the following activities: development of treatment plan with patient and/or surrogate as well as nursing, discussions with consultants, evaluation of patient's response to treatment, examination of patient, obtaining history from patient or surrogate, ordering and performing treatments and interventions, ordering and review of laboratory studies, ordering and review of radiographic studies, pulse oximetry and re-evaluation of patient's condition.      Gerhard Munch, MD 07/26/13 2159

## 2013-07-27 ENCOUNTER — Inpatient Hospital Stay (HOSPITAL_COMMUNITY): Payer: Self-pay

## 2013-07-27 LAB — GLUCOSE, CAPILLARY
Glucose-Capillary: 301 mg/dL — ABNORMAL HIGH (ref 70–99)
Glucose-Capillary: 254 mg/dL — ABNORMAL HIGH (ref 70–99)
Glucose-Capillary: 179 mg/dL — ABNORMAL HIGH (ref 70–99)
Glucose-Capillary: 145 mg/dL — ABNORMAL HIGH (ref 70–99)
Glucose-Capillary: 198 mg/dL — ABNORMAL HIGH (ref 70–99)
Glucose-Capillary: 237 mg/dL — ABNORMAL HIGH (ref 70–99)
Glucose-Capillary: 126 mg/dL — ABNORMAL HIGH (ref 70–99)
Glucose-Capillary: 168 mg/dL — ABNORMAL HIGH (ref 70–99)
Glucose-Capillary: 114 mg/dL — ABNORMAL HIGH (ref 70–99)
Glucose-Capillary: 122 mg/dL — ABNORMAL HIGH (ref 70–99)
Glucose-Capillary: 159 mg/dL — ABNORMAL HIGH (ref 70–99)
Glucose-Capillary: 104 mg/dL — ABNORMAL HIGH (ref 70–99)
Glucose-Capillary: 158 mg/dL — ABNORMAL HIGH (ref 70–99)
Glucose-Capillary: 165 mg/dL — ABNORMAL HIGH (ref 70–99)
Glucose-Capillary: 185 mg/dL — ABNORMAL HIGH (ref 70–99)

## 2013-07-27 LAB — BASIC METABOLIC PANEL
BUN: 14 mg/dL (ref 6–23)
BUN: 23 mg/dL (ref 6–23)
BUN: 28 mg/dL — ABNORMAL HIGH (ref 6–23)
BUN: 34 mg/dL — ABNORMAL HIGH (ref 6–23)
BUN: 9 mg/dL (ref 6–23)
CO2: 15 mEq/L — ABNORMAL LOW (ref 19–32)
CO2: 17 mEq/L — ABNORMAL LOW (ref 19–32)
CO2: 18 mEq/L — ABNORMAL LOW (ref 19–32)
CO2: 18 mEq/L — ABNORMAL LOW (ref 19–32)
CO2: 19 mEq/L (ref 19–32)
CO2: 7 mEq/L — CL (ref 19–32)
Calcium: 7.9 mg/dL — ABNORMAL LOW (ref 8.4–10.5)
Calcium: 8 mg/dL — ABNORMAL LOW (ref 8.4–10.5)
Calcium: 7.8 mg/dL — ABNORMAL LOW (ref 8.4–10.5)
Calcium: 7.5 mg/dL — ABNORMAL LOW (ref 8.4–10.5)
Calcium: 8 mg/dL — ABNORMAL LOW (ref 8.4–10.5)
Calcium: 7.9 mg/dL — ABNORMAL LOW (ref 8.4–10.5)
Chloride: 111 mEq/L (ref 96–112)
Chloride: 111 mEq/L (ref 96–112)
Chloride: 110 mEq/L (ref 96–112)
Chloride: 108 mEq/L (ref 96–112)
Chloride: 108 mEq/L (ref 96–112)
Chloride: 109 mEq/L (ref 96–112)
Creatinine, Ser: 0.48 mg/dL — ABNORMAL LOW (ref 0.50–1.10)
Creatinine, Ser: 0.5 mg/dL (ref 0.50–1.10)
Creatinine, Ser: 0.53 mg/dL (ref 0.50–1.10)
Creatinine, Ser: 0.56 mg/dL (ref 0.50–1.10)
Creatinine, Ser: 0.64 mg/dL (ref 0.50–1.10)
Creatinine, Ser: 0.75 mg/dL (ref 0.50–1.10)
Creatinine, Ser: 0.92 mg/dL (ref 0.50–1.10)
GFR calc Af Amer: >90 mL/min (ref >90)
GFR calc Af Amer: >90 mL/min (ref >90)
GFR calc Af Amer: >90 mL/min (ref >90)
GFR calc Af Amer: >90 mL/min (ref >90)
GFR calc Af Amer: >90 mL/min (ref >90)
GFR calc Af Amer: >90 mL/min (ref >90)
GFR calc non Af Amer: 83 mL/min — ABNORMAL LOW (ref >90)
GFR calc non Af Amer: >90 mL/min (ref >90)
GFR calc non Af Amer: >90 mL/min (ref >90)
GFR calc non Af Amer: >90 mL/min (ref >90)
GFR calc non Af Amer: >90 mL/min (ref >90)
GFR calc non Af Amer: >90 mL/min (ref >90)
GFR calc non Af Amer: >90 mL/min (ref >90)
Glucose, Bld: 275 mg/dL — ABNORMAL HIGH (ref 70–99)
Glucose, Bld: 217 mg/dL — ABNORMAL HIGH (ref 70–99)
Glucose, Bld: 168 mg/dL — ABNORMAL HIGH (ref 70–99)
Potassium: 3.4 mEq/L — ABNORMAL LOW (ref 3.5–5.1)
Potassium: 4 mEq/L (ref 3.5–5.1)
Potassium: 3.7 mEq/L (ref 3.5–5.1)
Sodium: 135 mEq/L (ref 135–145)
Sodium: 136 mEq/L (ref 135–145)
Sodium: 136 mEq/L (ref 135–145)
Sodium: 136 mEq/L (ref 135–145)
Sodium: 137 mEq/L (ref 135–145)
Sodium: 138 mEq/L (ref 135–145)
Sodium: 141 mEq/L (ref 135–145)

## 2013-07-27 LAB — CBC
HCT: 37.9 % (ref 36.0–46.0)
Hemoglobin: 13.1 g/dL (ref 12.0–15.0)
MCH: 31.3 pg (ref 26.0–34.0)
MCHC: 34.6 g/dL (ref 30.0–36.0)
MCHC: 34.8 g/dL (ref 30.0–36.0)
Platelets: 236 10*3/uL (ref 150–400)
RBC: 4.54 MIL/uL (ref 3.87–5.11)
RDW: 14.4 % (ref 11.5–15.5)
RDW: 14.5 % (ref 11.5–15.5)
WBC: 14.8 10*3/uL — ABNORMAL HIGH (ref 4.0–10.5)

## 2013-07-27 LAB — LIPASE, BLOOD: Lipase: 30 U/L (ref 11–59)

## 2013-07-27 LAB — MRSA PCR SCREENING: MRSA by PCR: POSITIVE — AB

## 2013-07-27 MED ORDER — CHLORHEXIDINE GLUCONATE CLOTH 2 % EX PADS
6.0000 | MEDICATED_PAD | Freq: Every day | CUTANEOUS | Status: DC
  Administered 2013-07-27 – 2013-07-29 (×3): 6 via TOPICAL

## 2013-07-27 MED ORDER — MORPHINE SULFATE 2 MG/ML IJ SOLN
2.0000 mg | INTRAMUSCULAR | Status: DC | PRN
  Administered 2013-07-27 (×2): 2 mg via INTRAVENOUS
  Filled 2013-07-27 (×2): qty 1

## 2013-07-27 MED ORDER — FAMOTIDINE 20 MG PO TABS
20.0000 mg | ORAL_TABLET | Freq: Two times a day (BID) | ORAL | Status: DC
  Administered 2013-07-27 – 2013-07-29 (×4): 20 mg via ORAL
  Filled 2013-07-27 (×5): qty 1

## 2013-07-27 MED ORDER — POTASSIUM CHLORIDE 10 MEQ/100ML IV SOLN
10.0000 meq | INTRAVENOUS | Status: AC
  Administered 2013-07-27 (×4): 10 meq via INTRAVENOUS
  Filled 2013-07-27: qty 100

## 2013-07-27 MED ORDER — ACETAMINOPHEN 325 MG PO TABS
650.0000 mg | ORAL_TABLET | Freq: Four times a day (QID) | ORAL | Status: DC | PRN
  Administered 2013-07-27 – 2013-07-28 (×4): 650 mg via ORAL
  Filled 2013-07-27 (×4): qty 2

## 2013-07-27 MED ORDER — IBUPROFEN 200 MG PO TABS
200.0000 mg | ORAL_TABLET | Freq: Four times a day (QID) | ORAL | Status: DC | PRN
  Administered 2013-07-28: 200 mg via ORAL
  Filled 2013-07-27: qty 1

## 2013-07-27 MED ORDER — DEXTROSE 5 % IV SOLN
1.0000 g | INTRAVENOUS | Status: DC
  Administered 2013-07-27 – 2013-07-29 (×3): 1 g via INTRAVENOUS
  Filled 2013-07-27 (×4): qty 10

## 2013-07-27 MED ORDER — IOHEXOL 300 MG/ML  SOLN
80.0000 mL | Freq: Once | INTRAMUSCULAR | Status: AC | PRN
  Administered 2013-07-27: 80 mL via INTRAVENOUS

## 2013-07-27 MED ORDER — DEXTROSE 5 % IV SOLN
500.0000 mg | Freq: Once | INTRAVENOUS | Status: AC
  Administered 2013-07-27: 500 mg via INTRAVENOUS
  Filled 2013-07-27: qty 500

## 2013-07-27 MED ORDER — ONDANSETRON 8 MG/NS 50 ML IVPB
8.0000 mg | Freq: Once | INTRAVENOUS | Status: AC
  Administered 2013-07-27: 8 mg via INTRAVENOUS
  Filled 2013-07-27: qty 8

## 2013-07-27 MED ORDER — INSULIN ASPART 100 UNIT/ML ~~LOC~~ SOLN
0.0000 [IU] | SUBCUTANEOUS | Status: DC
  Administered 2013-07-28: 7 [IU] via SUBCUTANEOUS
  Administered 2013-07-28: 4 [IU] via SUBCUTANEOUS

## 2013-07-27 MED ORDER — IOHEXOL 300 MG/ML  SOLN
25.0000 mL | INTRAMUSCULAR | Status: AC
  Administered 2013-07-27 (×2): 25 mL via ORAL

## 2013-07-27 MED ORDER — KCL IN DEXTROSE-NACL 30-5-0.45 MEQ/L-%-% IV SOLN
INTRAVENOUS | Status: DC
  Administered 2013-07-27: 22:00:00 via INTRAVENOUS
  Administered 2013-07-27: 125 mL/h via INTRAVENOUS
  Administered 2013-07-28: 07:00:00 via INTRAVENOUS
  Filled 2013-07-27 (×6): qty 1000

## 2013-07-27 MED ORDER — SODIUM CHLORIDE 0.9 % IV BOLUS (SEPSIS)
1000.0000 mL | Freq: Once | INTRAVENOUS | Status: AC
  Administered 2013-07-27: 1000 mL via INTRAVENOUS

## 2013-07-27 MED ORDER — POTASSIUM CHLORIDE CRYS ER 20 MEQ PO TBCR
40.0000 meq | EXTENDED_RELEASE_TABLET | Freq: Once | ORAL | Status: AC
  Administered 2013-07-27: 40 meq via ORAL
  Filled 2013-07-27: qty 2

## 2013-07-27 MED ORDER — MUPIROCIN 2 % EX OINT
1.0000 "application " | TOPICAL_OINTMENT | Freq: Two times a day (BID) | CUTANEOUS | Status: DC
  Administered 2013-07-27 – 2013-07-29 (×5): 1 via NASAL
  Filled 2013-07-27 (×2): qty 22

## 2013-07-27 MED ORDER — POTASSIUM CHLORIDE 10 MEQ/100ML IV SOLN: 10.0000 meq | INTRAVENOUS | Status: DC

## 2013-07-27 MED ORDER — INSULIN GLARGINE 100 UNIT/ML ~~LOC~~ SOLN
10.0000 [IU] | Freq: Every day | SUBCUTANEOUS | Status: DC
  Administered 2013-07-27: 10 [IU] via SUBCUTANEOUS
  Filled 2013-07-27 (×2): qty 0.1

## 2013-07-27 NOTE — Progress Notes (Signed)
FPTS Interim NOte: Came to see patient to check on her. She still feels sore in her abdomen. No nausea or vomiting today. She is feeling thirsty.  On exam, some discomfort diffusely through abdomen, no guarding or rebound She is alert and oriented x3 and responds to questions. She is ill appearing.   A/P: 32 yo type 1 diabetic in DKA - gap has closed. Started lantus and will wait for 2-3 hrs with repeat BMP to assess persistent gap closure and normalizing bicarb. If continues to normalize, will d/c drip.  - replete K through IV. If can tolerate po, could replenish orally.   Marena Chancy, PGY-3 Family Medicine Resident

## 2013-07-27 NOTE — Progress Notes (Signed)
Family Medicine Teaching Service Daily Progress Note Intern Pager: [phone]  Patient name: Kristine Allison Medical record number: [id number] Date of birth: 1980-12-07 Age: 32 y.o. Gender: female  Primary Care Provider: Janit Pagan, MD Consultants: None Code Status: Full  Pt Overview and Major Events to Date:   Assessment and Plan: Kristine Allison is a 32 y.o. female presenting with DKA and abdominal pain . PMH is significant for poorly controlled DM type 1 and Asthma.   # DKA  - Somnolent & Tachycardic w/ Glucose: 893, venous pH 6.9; Bicarb 4.5; AG: 41. CXR in ED: No active cardiopulmonary disease; UA: Nit, Leuk (-). EKG: Sinus tachycardia w/ QTc 507. Was Hospitalized 06/05/13 w/ DKA. Had been sharing Insulin w/ her mother due to cost and switched to 70/30, and CM consulted for applicant for Medicaid. Missed Hf/u wit PCP. S/p 3L NS bolus in the ED.  - Trigger: Medication non adherence vs possible infection (see Ab pain below)  - Rehydrate w/ NS @ 125; Na: 138, K: 3.9  - Insulin via Glucostabilizer until AG closed x 2 then reassess home Insulin regimen  - Trend BMETs; Monitor and replete K as need  - Strict I&O  - Abx: CTX and Azithro for CAP - blood cultures: pending - Care management consulted  # AKI  - Cr 0.56 improved from 1.51 on admission. Most likely prerenal due to dehydration (BUN/Cr ratio 330)  - Continue to Rehydrate   # Abdominal Pain  - Periumbilical pain w/o guarding or rebounding. Leukocytosis: 22.5 on admission. Hx limited by pt's somnolence  - DDx: Pain due to DKA vs appendicitis vs viral gastroenteritis  - CT abdomen/pelvis:   No evidence of appendicitis or other significant abnormality within the abdomen or pelvis.   Mild airspace opacity in the posterior right lower lobe, suspicious for pneumonia or other inflammatory process. - CXR: No active cardiopulmonary disease - No fevers, WBC 14.8 decrease from  22.5 on admission.  - Lipase: wnl -  Morphine 2mg  q 4hrs prn for pain control   # Somnolence: easily rousable; oriented to person, place and situtation only on admission - Likely related to DKA and acidosis. May also be related to an acute infection (see above).  - Monitor for improvement with resolution of DKA  FEN/GI:  Diet: NPO  IVF: NS 125  Prophylaxis: Heparin  Disposition: Home pending Resolution of DKA  Subjective: Continue to be very somnolent this morning, and endorsing Abdominal pain.   Objective: Temp:  [97.6 F (36.4 C)-99.1 F (37.3 C)] 98.7 F (37.1 C) (11/05 0800) Pulse Rate:  [101-123] 101 (11/05 0800) Resp:  [13-28] 16 (11/05 0800) BP: (95-132)/(45-68) 106/58 mmHg (11/05 0800) SpO2:  [99 %-100 %] 100 % (11/05 0800) Weight:  [113 lb 5.1 oz (51.4 kg)] 113 lb 5.1 oz (51.4 kg) (11/04 2206) Physical Exam: Gen: Somnolent F NAD; A&O to person and place, but not time; Afebrile  HEENT: Mucus membranes dry; Sclera white; Conjunctiva pink; EOMI;  CV: Tachy cardiac w/ normal Rhythm, No m/r/g; No LE Edema  Lungs: CTAB; Slow deep breaths  GI: No skin changes; BS +; Moderate diffuse tenderness; No rebounding or guarding, no peritoneal signs  Lower Ext: No skin changes; No edema   Laboratory:  Recent Labs Lab 07/26/13 1855 07/26/13 2348 07/27/13 0430  WBC 22.5* 20.8* 14.8*  HGB 16.9* 14.8 13.1  HCT 52.0* 42.5 37.9  PLT 357 217 236    Recent Labs Lab 07/27/13 0200 07/27/13 0432 07/27/13 0642  NA  141 138 138  K 4.5 4.5 3.9  CL 111 111 111  CO2 10* 13* 16*  BUN 28* 23 20  CREATININE 0.75 0.64 0.56  CALCIUM 7.9* 8.0* 7.8*  GLUCOSE 275* 217* 183*   Imaging/Diagnostic Tests: CT AB/Pelvis IMPRESSION:  No evidence of appendicitis or other significant abnormality within  the abdomen or pelvis.  Mild airspace opacity in the posterior right lower lobe, suspicious  for pneumonia or other inflammatory process.  CXR: IMPRESSION:  No active cardiopulmonary disease.  Wenda Low, MD 07/27/2013,  9:44 AM PGY-1, [facility] FPTS Intern pager: [phone], text pages welcome

## 2013-07-27 NOTE — H&P (Signed)
Family Medicine Teaching [facility] Admission History and Physical Service Pager: [phone]  Patient name: Kristine Allison Medical record number: [id number] Date of birth: 1981-09-02 Age: 32 y.o. Gender: female  Primary Care Provider: Janit Pagan, MD Consultants: None Code Status: Full  Chief Complaint: Abdominal Pain  Assessment and Plan: Kristine Allison is a 32 y.o. female presenting with DKA and abdominal pain . PMH is significant for poorly controlled DM type 1 and Asthma.   # DKA - Somnolent & Tachycardic w/ Glucose: 893, venous pH 6.9; Bicarb 4.5; AG: 41. CXR in ED: No active cardiopulmonary disease; UA: Nit, Leuk (-). EKG: Sinus tachycardia w/ QTc 507. Was Hospitalized 06/05/13 w/ DKA. Had been sharing Insulin w/ her mother due to cost and switched to 70/30, and CM consulted for applicant for Medicaid. Missed Hf/u wit PCP. S/p 3L NS bolus in the ED. - Trigger: Medication non adherence vs possible infection (see Ab pain below) - Blood gas in morning - Rehydrate w/ NS @ 125; Corrected Na: 138, K: 6.3 - Insulin via Glucostabilizer until AG closed x 2 then reassess home Insulin regimen - Trend BMETs; Monitor and replete K as need  - Strict I&O - Blood cultures: pending - Care management consult  # AKI - Cr 1.51, BUN 49. Most likely prerenal due to dehydration (BUN/Cr ratio 330) - Rehydrate and reassess  # Abdominal Pain - Periumbilical pain w/o guarding or rebounding. Leukocytosis: 22.5. Hx limited by pt's somnolence - DDx: Pain due to DKA vs appendicitis vs viral gastroenteritis  - CT Abdomen - Lipase - Repeat CBC in am - Morphine 2mg  q 4hrs prn for pain control  # Somnolence: easily rousable; oriented to person, place and situtation only - Likely related to DKA and acidosis.  May also be related to an acute infection. - CT abdomen/pelvis ordered to evaluate for GI pathology. - no indication of infection on CXR or UA. - No fevers, despite WBC of  22.5. - blood cultures obtained in ED. - will hold off on antibiotics for now.  FEN/GI:  Diet: NPO IVF: NS 125 Prophylaxis: Heparin  Disposition: Admit to Step down; Attending Dr Mauricio Po; Discharge home w/ resolution of DKA and evaluation of Abdominal pain  History of Present Illness: Kristine Allison is a 32 y.o. female presenting with abdominal pain, N/V w/o blood or diarrhea, and anorexia x 2 days. She reports her N/V starting 2 days, and stomach pain occurred afterwards. She has not eaten for 2 days due the N/V and pain, but did take her insulin yesterday morning (Home regimen 70/30 12u BID). She denies any fevers, coughing, dysuria, but reports sick contacts: Daughter and Niece. Her Hx is limited by her somnolence. She is unable to describe her stomach pain in detail, but denies any previously abdominal surgeries. Pt reports recent compliance with Insulin, but ED note states "difficulty obtaining insulin for some time. She inconsistently takes the medication from family members," which was also noted at her previous hospitalization 06/05/13.   [facility] ED: 4L boluses of NS; Insulin drip start  Review Of Systems: Per HPI with the following additions:  Otherwise 12 point review of systems was performed and was unremarkable.  Patient Active Problem List   Diagnosis Date Noted  . DKA, type 1 07/26/2013  . SIRS (systemic inflammatory response syndrome) 07/26/2013  . Edema 06/24/2012  . Diabetes mellitus 06/24/2012  . GERD (gastroesophageal reflux disease) 06/17/2012   Past Medical History: Past Medical History  Diagnosis Date  . Asthma   .  Diabetes mellitus without complication    Past Surgical History: History reviewed. No pertinent past surgical history. Social History: History  Substance Use Topics  . Smoking status: Current Every Day Smoker    Types: Cigarettes  . Smokeless tobacco: Not on file  . Alcohol Use: No   Additional social history:  Please also refer to relevant  sections of EMR.  Family History: No family history on file. Allergies and Medications: Allergies  Allergen Reactions  . Latex Rash   No current facility-administered medications on file prior to encounter.   Current Outpatient Prescriptions on File Prior to Encounter  Medication Sig Dispense Refill  . albuterol (PROAIR HFA) 108 (90 BASE) MCG/ACT inhaler Inhale 2 puffs into the lungs 2 (two) times daily as needed for wheezing or shortness of breath.      Marland Kitchen ibuprofen (ADVIL,MOTRIN) 200 MG tablet Take 400 mg by mouth every 6 (six) hours as needed for pain.         Objective: BP 112/64  Pulse 122  Temp(Src) 97.9 F (36.6 C) (Oral)  Resp 21  Ht 5\' 5"  (1.651 m)  Wt 113 lb 5.1 oz (51.4 kg)  BMI 18.86 kg/m2  SpO2 100%  Exam: Gen: Somnolent F NAD; A&O to person and place, but not time; Afebrile HEENT: Mucus membranes dry; Sclera white; Conjunctiva pink; EOMI;  CV: Tachy cardiac w/ normal Rhythm, No m/r/g; No LE Edema Lungs: CTAB; Slow deep breaths GI: No skin changes; BS +; Moderate periumbilical tenderness; No rebounding or guarding, no peritoneal signs Lower Ext: No skin changes; No edema Neuro: tired but easily rousable; oriented to person, place and situation, only oriented to year; no focal deficits but limited cooperation with exam  Labs and Imaging: Results for orders placed during the hospital encounter of 07/26/13 (from the past 24 hour(s))  GLUCOSE, CAPILLARY     Status: Abnormal   Collection Time    07/26/13  6:48 PM      Result Value Range   Glucose-Capillary >600 (*) 70 - 99 mg/dL   Comment 1 Documented in Chart     Comment 2 Notify RN    CBC WITH DIFFERENTIAL     Status: Abnormal   Collection Time    07/26/13  6:55 PM      Result Value Range   WBC 22.5 (*) 4.0 - 10.5 K/uL   RBC 5.28 (*) 3.87 - 5.11 MIL/uL   Hemoglobin 16.9 (*) 12.0 - 15.0 g/dL   HCT 40.9 (*) 81.1 - 91.4 %   MCV 98.5  78.0 - 100.0 fL   MCH 32.0  26.0 - 34.0 pg   MCHC 32.5  30.0 - 36.0 g/dL    RDW 78.2  95.6 - 21.3 %   Platelets 357  150 - 400 K/uL   Neutrophils Relative % 83 (*) 43 - 77 %   Lymphocytes Relative 9 (*) 12 - 46 %   Monocytes Relative 8  3 - 12 %   Eosinophils Relative 0  0 - 5 %   Basophils Relative 0  0 - 1 %   Neutro Abs 18.7 (*) 1.7 - 7.7 K/uL   Lymphs Abs 2.0  0.7 - 4.0 K/uL   Monocytes Absolute 1.8 (*) 0.1 - 1.0 K/uL   Eosinophils Absolute 0.0  0.0 - 0.7 K/uL   Basophils Absolute 0.0  0.0 - 0.1 K/uL   Smear Review MORPHOLOGY UNREMARKABLE    BASIC METABOLIC PANEL     Status: Abnormal   Collection Time  07/26/13  6:55 PM      Result Value Range   Sodium 126 (*) 135 - 145 mEq/L   Potassium 6.3 (*) 3.5 - 5.1 mEq/L   Chloride 78 (*) 96 - 112 mEq/L   CO2 <7 (*) 19 - 32 mEq/L   Glucose, Bld 893 (*) 70 - 99 mg/dL   BUN 49 (*) 6 - 23 mg/dL   Creatinine, Ser 1.61 (*) 0.50 - 1.10 mg/dL   Calcium 9.6  8.4 - 09.6 mg/dL   GFR calc non Af Amer 45 (*) >90 mL/min   GFR calc Af Amer 53 (*) >90 mL/min  URINALYSIS, ROUTINE W REFLEX MICROSCOPIC     Status: Abnormal   Collection Time    07/26/13  7:02 PM      Result Value Range   Color, Urine YELLOW  YELLOW   APPearance CLEAR  CLEAR   Specific Gravity, Urine 1.031 (*) 1.005 - 1.030   pH 5.0  5.0 - 8.0   Glucose, UA >1000 (*) NEGATIVE mg/dL   Hgb urine dipstick NEGATIVE  NEGATIVE   Bilirubin Urine MODERATE (*) NEGATIVE   Ketones, ur >80 (*) NEGATIVE mg/dL   Protein, ur NEGATIVE  NEGATIVE mg/dL   Urobilinogen, UA 0.2  0.0 - 1.0 mg/dL   Nitrite NEGATIVE  NEGATIVE   Leukocytes, UA NEGATIVE  NEGATIVE  URINE MICROSCOPIC-ADD ON     Status: None   Collection Time    07/26/13  7:02 PM      Result Value Range   Squamous Epithelial / LPF RARE  RARE   WBC, UA 3-6  <3 WBC/hpf   Bacteria, UA RARE  RARE  POCT PREGNANCY, URINE     Status: None   Collection Time    07/26/13  7:12 PM      Result Value Range   Preg Test, Ur NEGATIVE  NEGATIVE  BLOOD GAS, VENOUS     Status: Abnormal   Collection Time    07/26/13   7:35 PM      Result Value Range   pH, Ven 6.993 (*) 7.250 - 7.300   pCO2, Ven 19.5 (*) 45.0 - 50.0 mmHg   pO2, Ven 53.4 (*) 30.0 - 45.0 mmHg   Bicarbonate 4.5 (*) 20.0 - 24.0 mEq/L   TCO2 4.5  0 - 100 mmol/L   Acid-base deficit 27.1 (*) 0.0 - 2.0 mmol/L   O2 Saturation 74.9     Patient temperature 98.6     Collection site VEIN     Drawn by [facility] ALLRED,PBT     Sample type VEIN    GLUCOSE, CAPILLARY     Status: Abnormal   Collection Time    07/26/13  8:38 PM      Result Value Range   Glucose-Capillary 483 (*) 70 - 99 mg/dL  GLUCOSE, CAPILLARY     Status: Abnormal   Collection Time    07/26/13  9:34 PM      Result Value Range   Glucose-Capillary 430 (*) 70 - 99 mg/dL  CBC     Status: Abnormal   Collection Time    07/26/13 11:48 PM      Result Value Range   WBC 20.8 (*) 4.0 - 10.5 K/uL   RBC 4.54  3.87 - 5.11 MIL/uL   Hemoglobin 14.8  12.0 - 15.0 g/dL   HCT 04.5  40.9 - 81.1 %   MCV 93.6  78.0 - 100.0 fL   MCH 32.6  26.0 - 34.0 pg  MCHC 34.8  30.0 - 36.0 g/dL   RDW 16.1  09.6 - 04.5 %   Platelets 217  150 - 400 K/uL   CT Abdomen Pelvis W Contrast: Pending  Wenda Low, MD 07/27/2013, 12:20 AM PGY-1, [facility] FPTS Intern pager: [phone], text pages welcome  PGY-3 Addendum I have seen and examined this patient.  I agree with the above note, with my edits in pink.  Tesean Stump 07/27/2013, 3:58 AM

## 2013-07-27 NOTE — Progress Notes (Signed)
Rechecked patient at 5:50am.   She reports feeling poorly.  States the abdominal pain is worse.  BP 95/45  Pulse 108  Temp(Src) 98.3 F (36.8 C) (Oral)  Resp 13  Ht 5\' 5"  (1.651 m)  Wt 113 lb 5.1 oz (51.4 kg)  BMI 18.86 kg/m2  SpO2 100% Gen: lying in bed, NAD but looks a little uncomfortable Abd: +BS, soft, ND, tender in RLQ with some voluntary guarding, positive pain with shaking the bed  A/P: DKA; concern for appendicitis - will give another 1L bolus given tachycardia and low BP (after morphine) - CT abd/pelvis just waiting for her to finish the contrast - will give dose of ceftriaxone 1g IV - will call surgery  Asencion Loveday 07/27/2013, 5:55 AM

## 2013-07-27 NOTE — Progress Notes (Signed)
Utilization review completed.  

## 2013-07-27 NOTE — Progress Notes (Addendum)
PCP Note. Kristine Fore,MD I stopped by to see Kristine Allison at the hospital, she does not seem to want to communication,she seem to be sleepy.This is her second hospital admission in the last few month, she did not follow up to see me at the clinic after her last hospital discharge. Her eyes were closed throughout my visit to her and was answering my questions with one syllable word. As discussed with her, I will see her again once discharged from the hospital to f/u with me at the clinic. I reviewed her chart and I appreciate care provided by FMTS team.

## 2013-07-27 NOTE — Care Management Note (Signed)
    Page 1 of 2   07/27/2013     10:51:40 AM   CARE MANAGEMENT NOTE 07/27/2013  Patient:  Kristine Allison, Kristine Allison   Account Number:  [id number]  Date Initiated:  07/27/2013  Documentation initiated by:  Kristine Allison  Subjective/Objective Assessment:   Pt admitted with DKA     Action/Plan:   PTA pt lived at home with mother   Anticipated DC Date:  07/29/2013   Anticipated DC Plan:  HOME/SELF CARE      DC Planning Services  CM consult      Choice offered to / List presented to:             Status of service:  In process, will continue to follow Medicare Important Message given?   (If response is "NO", the following Medicare IM given date fields will be blank) Date Medicare IM given:   Date Additional Medicare IM given:    Discharge Disposition:    Per UR Regulation:  Reviewed for med. necessity/level of care/duration of stay  If discussed at Long Length of Stay Meetings, dates discussed:    Comments:  07/27/13- 1045- Kristine Pierini RN, BSN [phone] Referral for PCP/medication needs- pt was seen by ED-CM on admission- see note: [name], RN Registered Nurse Signed CASE MANAGEMENT Progress Notes Service date: 07/26/2013 10:11 PM Patient confirms she does not have a pcp or insurance. Patient reports she works at [employer] full time but does not have insurance.  [facility] asked patient if she was taking insulin, patient responded, "When I can get it."  [facility] asked patient where does she get her insulin, patient stated, "I get it here, but I did not know it was for only thirty days."  Upon record review, patient has already been initiated into the [facility] program on 06/07/2013.  Also, patient's pcp is listed as Dr. Janit Pagan and [facility].  Patient not feeling well and not very receptive to conversation with this [employer]  [facility] provided patient with a list of pcp's who accept self pay patients, list of discounted pharmacies and website needymeds.org for medication assistance,   information regarding Affordable Care Act and Medicaid for insurance,  financial assistance in the community such as local churches and [employer], crisis intervention information, and dental assistance for uninsured patients.  Also provided patient phone number to call to inquire about the orange card.  Patient also in not opposed to [place] making an appointment at [facility] and wellness clinic.  No further needs at this time.  Confirmed that pt has used MATCH- would not be eligible for further medication assistance at this time- will f/u for any further need prior to d/c

## 2013-07-27 NOTE — Discharge Summary (Signed)
Family Medicine Teaching [facility] Discharge Summary  Patient name: Kristine Allison Medical record number: [id number] Date of birth: 1981-04-11 Age: 32 y.o. Gender: female Date of Admission: 07/26/2013  Date of Discharge: 07/29/13 Admitting Physician: Lora Paula, MD  Primary Care Provider: Janit Pagan, MD Consultants: None  Indication for Hospitalization: DKA & CAP  Discharge Diagnoses/Problem List:  1. DKA 2. CAP 3. AKI 4. Leukopenia 5. Thrombocytopenia  Disposition: Home  Discharge Condition: Stable  Brief Hospital Course: Kristine Allison is a 32 y.o. female presenting with DKA and abdominal pain . PMH is significant for poorly controlled DM type 1 and Asthma. Initially she was omnolent & tachycardic w/ Glucose: 893, venous pH 6.9; Bicarb 4.5, and AG: 41. CXR and CT Abdomin/Pelvis revealed posterior right lower lobe pneumonia and she was treated with Ceftriaxone and Azithromycin for CAP. She was treated with Insulin drip until her anion gap had closed x 2, and then switched to her home Insulin regimen 70/30 12u BID and increased to 13 units BID at discharge. She developed mild leukopenia and thrombocytopenia on day of discharge which was most likely dilutional vs viral sequela; however did receive Heparin in hospital. She was instructed to seek medical care for increased bruising and skin discoloration.   1. DKA: Initial labs: Glucose: 893, venous pH 6.9; Bicarb 4.5, and AG: 41. Treated with Insulin drip and rehydration until AG closed x 2. Switched back to home insulin regimen 70/30 13 units BID at discharge. CM was consulted and [facility] card information given. Has Hx of medication noncompliance due to financial difficulties and sharing insulin with mother. If qualifies for orange card would consider switching insulin to lantus + meal time coverage. 2. CAP: Right lower lobe pneumonia that was treated with CTX and Azithro (Started 11/5). CTX discontinued on  discharge; Azithro last dose on 11/10.  3. AKI: Cr 0.58 on discharge improved with rehydration from 1.51 on admission. Most likely prerenal due to dehydration (BUN/Cr ratio 330)  4. Leukopenia & Thrombocytopenia: Most likely combination of viral sequela and dilutional with rehydration for DKA. HIT and ITP possible, advised pt to seek medical care for increased bruising or skin discoloration. Recheck CBC at [facility].    Issues for Follow Up:  1. Repeat CBC: Mild Leukopenia and Thrombocytopenia at discharge 2. Discuss medication compliance and financial ability to afford Insulin 3. Follow-up with [facility] card (info given in hospital) 4. Complete Azithromycin? (last dose 11/10)  Significant Procedures: None  Significant Labs and Imaging:   Recent Labs Lab 07/27/13 0430 07/28/13 1512 07/29/13 0455  WBC 14.8* 5.2 3.6*  HGB 13.1 11.8* 12.3  HCT 37.9 33.1* 36.3  PLT 236 136* 128*    Recent Labs Lab 07/27/13 2330 07/28/13 0050 07/28/13 0415 07/28/13 1512 07/29/13 0455  NA 134* 133* 137 133* 133*  K 4.1 4.2 4.4 3.9 4.4  CL 107 108 109 106 104  CO2 18* 20 19 19 19   GLUCOSE 109* 198* 214* 252* 297*  BUN 8 8 7 10 12   CREATININE 0.53 0.58 0.59 0.65 0.58  CALCIUM 8.3* 8.2* 8.4 8.4 8.4   Results/Tests Pending at Time of Discharge:   Discharge Medications:    Medication List    STOP taking these medications       ibuprofen 200 MG tablet  Commonly known as:  ADVIL,MOTRIN     insulin glargine 100 UNIT/ML injection  Commonly known as:  LANTUS      TAKE these medications  azithromycin 250 MG tablet  Commonly known as:  ZITHROMAX  Received 500mg  in Hospital. Treating CAP     insulin aspart protamine- aspart (70-30) 100 UNIT/ML injection  Commonly known as:  NOVOLOG MIX 70/30  Inject 0.13 mLs (13 Units total) into the skin 2 (two) times daily with a meal.     PROAIR HFA 108 (90 BASE) MCG/ACT inhaler  Generic drug:  albuterol  Inhale 2 puffs into the lungs 2 (two) times  daily as needed for wheezing or shortness of breath.       Discharge Instructions: Please refer to Patient Instructions section of EMR for full details.  Patient was counseled important signs and symptoms that should prompt return to medical care, changes in medications, dietary instructions, activity restrictions, and follow up appointments.   Follow-Up Appointments:   Follow-up Information   Follow up with Carney Living, MD On 08/01/2013. (Apt at 3:45)    Specialty:  Family Medicine   Contact information:   [address] [place] [address] [phone]      Wenda Low, MD 07/27/2013, 10:54 AM PGY-1, [facility] Family Medicine

## 2013-07-27 NOTE — Progress Notes (Signed)
Inpatient Diabetes Program Recommendations  AACE/ADA: New Consensus Statement on Inpatient Glycemic Control (2013)  Target Ranges:  Prepandial:   less than 140 mg/dL      Peak postprandial:   less than 180 mg/dL (1-2 hours)      Critically ill patients:  140 - 180 mg/dL   Reason for Visit: Note patient admitted with DKA.  Attempted to speak to her however after multiple attempts, she did not open her eyes to talk to me.  Spoke with RN, and she states that patient has done this to several Dr.'s today also.  Discussed with resident.  Note that patient was on Novolin 70/30 regimen prior to admit due to cost.  She states that she will likely restart this regimen once patient is ready for discharge home.  Will attempt to speak to patient again on 07/28/13.

## 2013-07-27 NOTE — Progress Notes (Signed)
Patient ID: Kristine Allison, female   DOB: [birth year], 32 y.o.   MRN: [id number] This patient present last night with abdominal pain.  She was also admitted for DKA.  We had been asked to see the patient for possible appendicitis vs abdominal pain secondary to DKA.  Her CT scan is negative for appendicitis and her WBC is down to 14K.  I have spoken with the primary team and we will hold off on a consult for now, but if she fails to improve, then they will call us back and we will be happy to see the patient.  Krishang Reading E 9:32 AM 07/27/2013

## 2013-07-27 NOTE — H&P (Signed)
Attending Addendum  I examined the patient and discussed the assessment and plan with Dr. Piedad Climes. I have reviewed the note and agree.  This am patient reports abdominal pain x 2 days. She denies nausea, vomiting, fever, diarrhea or sick contacts. She reports taking her insulin as prescribed.   Exam: Tachycardia.  Gen: lying in bed, asleep on back, comfortable  Abdominal exam: NABS, soft, TTP diffusely, voluntary guarding, questionable rebound (pain no worse upon rebound), no masses.   A/P: 32 yo F with type I diabetes presents in DKA found to have abdominal pain.   1. DKA: improving on glucose stablizer. Anion gap closed. Still with metabolic acidosis, improving.  Plan to transition to SQ insulin overlapped with glucose stablizer x 2 hrs.  Patient s/sp 4 L NS bolus still with tachycardia. continue IVFs until tachycardia resolves.   2. Abdominal pain: may be 2/2 DKA. Other differentials ischemia, appendicitis. Agree CT abdomen. Add lactate. Agree with surgery evalDessa Phi, MD FAMILY MEDICINE TEACHING SERVICE

## 2013-07-28 DIAGNOSIS — J189 Pneumonia, unspecified organism: Secondary | ICD-10-CM

## 2013-07-28 LAB — GLUCOSE, CAPILLARY
Glucose-Capillary: 181 mg/dL — ABNORMAL HIGH (ref 70–99)
Glucose-Capillary: 199 mg/dL — ABNORMAL HIGH (ref 70–99)
Glucose-Capillary: 94 mg/dL (ref 70–99)

## 2013-07-28 LAB — BASIC METABOLIC PANEL
BUN: 10 mg/dL (ref 6–23)
CO2: 20 mEq/L (ref 19–32)
CO2: 19 mEq/L (ref 19–32)
Calcium: 8.2 mg/dL — ABNORMAL LOW (ref 8.4–10.5)
Calcium: 8.3 mg/dL — ABNORMAL LOW (ref 8.4–10.5)
Calcium: 8.4 mg/dL (ref 8.4–10.5)
Chloride: 107 mEq/L (ref 96–112)
Chloride: 109 mEq/L (ref 96–112)
Creatinine, Ser: 0.59 mg/dL (ref 0.50–1.10)
GFR calc Af Amer: >90 mL/min (ref >90)
GFR calc Af Amer: >90 mL/min (ref >90)
GFR calc Af Amer: >90 mL/min (ref >90)
GFR calc Af Amer: >90 mL/min (ref >90)
GFR calc non Af Amer: >90 mL/min (ref >90)
GFR calc non Af Amer: >90 mL/min (ref >90)
GFR calc non Af Amer: >90 mL/min (ref >90)
GFR calc non Af Amer: >90 mL/min (ref >90)
Glucose, Bld: 109 mg/dL — ABNORMAL HIGH (ref 70–99)
Glucose, Bld: 252 mg/dL — ABNORMAL HIGH (ref 70–99)
Potassium: 3.9 mEq/L (ref 3.5–5.1)
Potassium: 4.1 mEq/L (ref 3.5–5.1)
Potassium: 4.2 mEq/L (ref 3.5–5.1)
Potassium: 4.4 mEq/L (ref 3.5–5.1)
Sodium: 133 mEq/L — ABNORMAL LOW (ref 135–145)
Sodium: 133 mEq/L — ABNORMAL LOW (ref 135–145)
Sodium: 134 mEq/L — ABNORMAL LOW (ref 135–145)

## 2013-07-28 LAB — CBC
Hemoglobin: 11.8 g/dL — ABNORMAL LOW (ref 12.0–15.0)
MCH: 32 pg (ref 26.0–34.0)
MCV: 89.7 fL (ref 78.0–100.0)
Platelets: 136 10*3/uL — ABNORMAL LOW (ref 150–400)
RBC: 3.69 MIL/uL — ABNORMAL LOW (ref 3.87–5.11)
RDW: 15.3 % (ref 11.5–15.5)

## 2013-07-28 MED ORDER — INSULIN ASPART PROT & ASPART (70-30 MIX) 100 UNIT/ML ~~LOC~~ SUSP
12.0000 [IU] | Freq: Two times a day (BID) | SUBCUTANEOUS | Status: DC
  Administered 2013-07-28 (×2): 12 [IU] via SUBCUTANEOUS
  Filled 2013-07-28: qty 10

## 2013-07-28 MED ORDER — CALCIUM CARBONATE ANTACID 500 MG PO CHEW
1.0000 | CHEWABLE_TABLET | ORAL | Status: DC | PRN
  Administered 2013-07-28 – 2013-07-29 (×2): 200 mg via ORAL
  Filled 2013-07-28 (×2): qty 1

## 2013-07-28 MED ORDER — INSULIN ASPART 100 UNIT/ML ~~LOC~~ SOLN
0.0000 [IU] | Freq: Three times a day (TID) | SUBCUTANEOUS | Status: DC
  Administered 2013-07-28: 3 [IU] via SUBCUTANEOUS
  Administered 2013-07-28: 5 [IU] via SUBCUTANEOUS
  Administered 2013-07-29: 8 [IU] via SUBCUTANEOUS
  Administered 2013-07-29: 5 [IU] via SUBCUTANEOUS

## 2013-07-28 MED ORDER — SODIUM CHLORIDE 0.45 % IV SOLN
INTRAVENOUS | Status: DC
  Administered 2013-07-28 – 2013-07-29 (×2): via INTRAVENOUS

## 2013-07-28 MED ORDER — PANTOPRAZOLE SODIUM 20 MG PO TBEC
20.0000 mg | DELAYED_RELEASE_TABLET | Freq: Once | ORAL | Status: AC
  Administered 2013-07-28: 20 mg via ORAL
  Filled 2013-07-28: qty 1

## 2013-07-28 NOTE — Progress Notes (Signed)
Family Medicine Teaching Service Daily Progress Note Intern Pager: [phone]  Patient name: Kristine Allison Medical record number: [id number] Date of birth: 1981/07/01 Age: 32 y.o. Gender: female  Primary Care Provider: Janit Pagan, MD Consultants: None Code Status: Full  Pt Overview and Major Events to Date:   Assessment and Plan: Kristine Allison is a 32 y.o. female presenting with DKA and abdominal pain . PMH is significant for poorly controlled DM type 1 and Asthma.   # DKA  - On admission Somnolent & Tachycardic w/ Glucose: 893, venous pH 6.9; Bicarb 4.5; AG: 41. CXR in ED: No active cardiopulmonary disease; UA: Nit, Leuk (-). EKG: Sinus tachycardia w/ QTc 507. Was Hospitalized 06/05/13 w/ DKA. Had been sharing Insulin w/ her mother due to cost and switched to 70/30, and CM consulted for applicant for Medicaid. Missed Hf/u wit PCP. S/p 3L NS bolus in the ED.  - Trigger: Medication non adherence vs possible infection (see CAP pain below)  - Trend BMETs; Monitor and replete K as need  - Strict I&O  - Care management: Provided orange card contact info - Insulin: Switch Lantus to 70/30 12 units BID (pts home regime) will adjust as needed - Check CBC and BMET at 3pm; then switch to daily  # CAP - Cough on admission with leukocytosis and Multiple sick contacts. CT revealed Mild airspace opacity in the posterior right lower lobe, suspicious for pneumonia  - CXR: No active cardiopulmonary disease - CTX and Azithro (Day 2/5) - blood cultures: NG x 2 days  # AKI: Resolved - Cr 0.53 improved from 1.51 on admission. Most likely prerenal due to dehydration (BUN/Cr ratio 330)   # Abdominal Pain  - Periumbilical pain w/o guarding or rebounding. Leukocytosis: 22.5 on admission. Hx limited by pt's somnolence  - DDx: Pain due to DKA vs appendicitis vs viral gastroenteritis  - CT abdomen/pelvis:   No evidence of appendicitis or other significant abnormality within the abdomen or  pelvis.   Mild airspace opacity in the posterior right lower lobe, suspicious for pneumonia or other inflammatory process. - No fevers, WBC 14.8 decrease from  22.5 on admission.  - Lipase: wnl  # Somnolence: Resolved - easily rousable; oriented to person, place and situtation only on admission - Likely related to DKA and acidosis. May also be related to an acute infection (see above).   FEN/GI:  Diet: Carb Mod IVF: 1/2NS @  50cc Prophylaxis: Heparin  Disposition: Home pending Resolution of DKA  Subjective: Somnolence resolved. Report mild HA and abdominal pain. Denies N/V, and is ready to eat.   Objective: Temp:  [97.6 F (36.4 C)-99.1 F (37.3 C)] 98.7 F (37.1 C) (11/05 0800) Pulse Rate:  [101-123] 101 (11/05 0800) Resp:  [13-28] 16 (11/05 0800) BP: (95-132)/(45-68) 106/58 mmHg (11/05 0800) SpO2:  [99 %-100 %] 100 % (11/05 0800) Weight:  [113 lb 5.1 oz (51.4 kg)] 113 lb 5.1 oz (51.4 kg) (11/04 2206) Physical Exam:  Gen: F NAD; Afebrile  HEENT: MMM; Sclera white; Conjunctiva pink; EOMI; PERRLA CV: RRR, No m/r/g; No LE Edema  Lungs: CTAB;  GI: No skin changes; BS +; No rebounding or guarding, no peritoneal signs  Lower Ext: No skin changes; No edema   Laboratory:  Recent Labs Results for orders placed during the hospital encounter of 07/26/13 (from the past 24 hour(s))  GLUCOSE, CAPILLARY     Status: Abnormal   Collection Time    07/27/13  1:31 AM  Result Value Range   Glucose-Capillary 254 (*) 70 - 99 mg/dL  BASIC METABOLIC PANEL     Status: Abnormal   Collection Time    07/27/13  2:00 AM      Result Value Range   Sodium 141  135 - 145 mEq/L   Potassium 4.5  3.5 - 5.1 mEq/L   Chloride 111  96 - 112 mEq/L   CO2 10 (*) 19 - 32 mEq/L   Glucose, Bld 275 (*) 70 - 99 mg/dL   BUN 28 (*) 6 - 23 mg/dL   Creatinine, Ser 6.57  0.50 - 1.10 mg/dL   Calcium 7.9 (*) 8.4 - 10.5 mg/dL   GFR calc non Af Amer >90  >90 mL/min   GFR calc Af Amer >90  >90 mL/min  LIPASE,  BLOOD     Status: None   Collection Time    07/27/13  2:00 AM      Result Value Range   Lipase 30  11 - 59 U/L  GLUCOSE, CAPILLARY     Status: Abnormal   Collection Time    07/27/13  2:42 AM      Result Value Range   Glucose-Capillary 237 (*) 70 - 99 mg/dL   Comment 1 Notify RN     Comment 2 Documented in Chart    GLUCOSE, CAPILLARY     Status: Abnormal   Collection Time    07/27/13  3:30 AM      Result Value Range   Glucose-Capillary 179 (*) 70 - 99 mg/dL  CBC     Status: Abnormal   Collection Time    07/27/13  4:30 AM      Result Value Range   WBC 14.8 (*) 4.0 - 10.5 K/uL   RBC 4.19  3.87 - 5.11 MIL/uL   Hemoglobin 13.1  12.0 - 15.0 g/dL   HCT 84.6  96.2 - 95.2 %   MCV 90.5  78.0 - 100.0 fL   MCH 31.3  26.0 - 34.0 pg   MCHC 34.6  30.0 - 36.0 g/dL   RDW 84.1  32.4 - 40.1 %   Platelets 236  150 - 400 K/uL  BASIC METABOLIC PANEL     Status: Abnormal   Collection Time    07/27/13  4:32 AM      Result Value Range   Sodium 138  135 - 145 mEq/L   Potassium 4.5  3.5 - 5.1 mEq/L   Chloride 111  96 - 112 mEq/L   CO2 13 (*) 19 - 32 mEq/L   Glucose, Bld 217 (*) 70 - 99 mg/dL   BUN 23  6 - 23 mg/dL   Creatinine, Ser 0.27  0.50 - 1.10 mg/dL   Calcium 8.0 (*) 8.4 - 10.5 mg/dL   GFR calc non Af Amer >90  >90 mL/min   GFR calc Af Amer >90  >90 mL/min  GLUCOSE, CAPILLARY     Status: Abnormal   Collection Time    07/27/13  4:35 AM      Result Value Range   Glucose-Capillary 198 (*) 70 - 99 mg/dL  GLUCOSE, CAPILLARY     Status: Abnormal   Collection Time    07/27/13  5:37 AM      Result Value Range   Glucose-Capillary 145 (*) 70 - 99 mg/dL  GLUCOSE, CAPILLARY     Status: Abnormal   Collection Time    07/27/13  6:31 AM      Result Value Range  Glucose-Capillary 168 (*) 70 - 99 mg/dL  BASIC METABOLIC PANEL     Status: Abnormal   Collection Time    07/27/13  6:42 AM      Result Value Range   Sodium 138  135 - 145 mEq/L   Potassium 3.9  3.5 - 5.1 mEq/L   Chloride 111  96 -  112 mEq/L   CO2 16 (*) 19 - 32 mEq/L   Glucose, Bld 183 (*) 70 - 99 mg/dL   BUN 20  6 - 23 mg/dL   Creatinine, Ser 1.61  0.50 - 1.10 mg/dL   Calcium 7.8 (*) 8.4 - 10.5 mg/dL   GFR calc non Af Amer >90  >90 mL/min   GFR calc Af Amer >90  >90 mL/min  GLUCOSE, CAPILLARY     Status: Abnormal   Collection Time    07/27/13  8:22 AM      Result Value Range   Glucose-Capillary 126 (*) 70 - 99 mg/dL  GLUCOSE, CAPILLARY     Status: Abnormal   Collection Time    07/27/13  9:26 AM      Result Value Range   Glucose-Capillary 114 (*) 70 - 99 mg/dL  BASIC METABOLIC PANEL     Status: Abnormal   Collection Time    07/27/13  9:55 AM      Result Value Range   Sodium 136  135 - 145 mEq/L   Potassium 3.4 (*) 3.5 - 5.1 mEq/L   Chloride 111  96 - 112 mEq/L   CO2 15 (*) 19 - 32 mEq/L   Glucose, Bld 119 (*) 70 - 99 mg/dL   BUN 14  6 - 23 mg/dL   Creatinine, Ser 0.96 (*) 0.50 - 1.10 mg/dL   Calcium 7.5 (*) 8.4 - 10.5 mg/dL   GFR calc non Af Amer >90  >90 mL/min   GFR calc Af Amer >90  >90 mL/min  GLUCOSE, CAPILLARY     Status: Abnormal   Collection Time    07/27/13 10:20 AM      Result Value Range   Glucose-Capillary 122 (*) 70 - 99 mg/dL   Comment 1 Documented in Chart    BASIC METABOLIC PANEL     Status: Abnormal   Collection Time    07/27/13 10:35 AM      Result Value Range   Sodium 137  135 - 145 mEq/L   Potassium 3.4 (*) 3.5 - 5.1 mEq/L   Chloride 110  96 - 112 mEq/L   CO2 17 (*) 19 - 32 mEq/L   Glucose, Bld 121 (*) 70 - 99 mg/dL   BUN 14  6 - 23 mg/dL   Creatinine, Ser 0.45  0.50 - 1.10 mg/dL   Calcium 7.9 (*) 8.4 - 10.5 mg/dL   GFR calc non Af Amer >90  >90 mL/min   GFR calc Af Amer >90  >90 mL/min  GLUCOSE, CAPILLARY     Status: Abnormal   Collection Time    07/27/13 11:29 AM      Result Value Range   Glucose-Capillary 104 (*) 70 - 99 mg/dL  GLUCOSE, CAPILLARY     Status: Abnormal   Collection Time    07/27/13 12:35 PM      Result Value Range   Glucose-Capillary 159 (*) 70  - 99 mg/dL   Comment 1 Documented in Chart    GLUCOSE, CAPILLARY     Status: Abnormal   Collection Time    07/27/13  1:54 PM  Result Value Range   Glucose-Capillary 158 (*) 70 - 99 mg/dL   Comment 1 Documented in Chart     Comment 2 Notify RN    BASIC METABOLIC PANEL     Status: Abnormal   Collection Time    07/27/13  3:00 PM      Result Value Range   Sodium 136  135 - 145 mEq/L   Potassium 3.3 (*) 3.5 - 5.1 mEq/L   Chloride 108  96 - 112 mEq/L   CO2 18 (*) 19 - 32 mEq/L   Glucose, Bld 179 (*) 70 - 99 mg/dL   BUN 11  6 - 23 mg/dL   Creatinine, Ser 1.61  0.50 - 1.10 mg/dL   Calcium 8.0 (*) 8.4 - 10.5 mg/dL   GFR calc non Af Amer >90  >90 mL/min   GFR calc Af Amer >90  >90 mL/min  GLUCOSE, CAPILLARY     Status: Abnormal   Collection Time    07/27/13  3:04 PM      Result Value Range   Glucose-Capillary 165 (*) 70 - 99 mg/dL   Comment 1 Documented in Chart    GLUCOSE, CAPILLARY     Status: Abnormal   Collection Time    07/27/13  4:15 PM      Result Value Range   Glucose-Capillary 185 (*) 70 - 99 mg/dL  BASIC METABOLIC PANEL     Status: Abnormal   Collection Time    07/27/13  4:55 PM      Result Value Range   Sodium 135  135 - 145 mEq/L   Potassium 4.0  3.5 - 5.1 mEq/L   Chloride 108  96 - 112 mEq/L   CO2 18 (*) 19 - 32 mEq/L   Glucose, Bld 168 (*) 70 - 99 mg/dL   BUN 10  6 - 23 mg/dL   Creatinine, Ser 0.96 (*) 0.50 - 1.10 mg/dL   Calcium 7.9 (*) 8.4 - 10.5 mg/dL   GFR calc non Af Amer >90  >90 mL/min   GFR calc Af Amer >90  >90 mL/min  GLUCOSE, CAPILLARY     Status: Abnormal   Collection Time    07/27/13  5:28 PM      Result Value Range   Glucose-Capillary 147 (*) 70 - 99 mg/dL  BASIC METABOLIC PANEL     Status: Abnormal   Collection Time    07/27/13  7:05 PM      Result Value Range   Sodium 136  135 - 145 mEq/L   Potassium 3.7  3.5 - 5.1 mEq/L   Chloride 109  96 - 112 mEq/L   CO2 19  19 - 32 mEq/L   Glucose, Bld 131 (*) 70 - 99 mg/dL   BUN 9  6 - 23  mg/dL   Creatinine, Ser 0.45  0.50 - 1.10 mg/dL   Calcium 7.9 (*) 8.4 - 10.5 mg/dL   GFR calc non Af Amer >90  >90 mL/min   GFR calc Af Amer >90  >90 mL/min  GLUCOSE, CAPILLARY     Status: Abnormal   Collection Time    07/27/13  7:33 PM      Result Value Range   Glucose-Capillary 133 (*) 70 - 99 mg/dL   Comment 1 Notify RN     Comment 2 Documented in Chart    BASIC METABOLIC PANEL     Status: Abnormal   Collection Time    07/27/13 11:30 PM  Result Value Range   Sodium 134 (*) 135 - 145 mEq/L   Potassium 4.1  3.5 - 5.1 mEq/L   Chloride 107  96 - 112 mEq/L   CO2 18 (*) 19 - 32 mEq/L   Glucose, Bld 109 (*) 70 - 99 mg/dL   BUN 8  6 - 23 mg/dL   Creatinine, Ser 1.61  0.50 - 1.10 mg/dL   Calcium 8.3 (*) 8.4 - 10.5 mg/dL   GFR calc non Af Amer >90  >90 mL/min   GFR calc Af Amer >90  >90 mL/min  GLUCOSE, CAPILLARY     Status: Abnormal   Collection Time    07/27/13 11:30 PM      Result Value Range   Glucose-Capillary 181 (*) 70 - 99 mg/dL   Comment 1 Notify RN     Comment 2 Documented in Chart     Imaging/Diagnostic Tests: CT AB/Pelvis IMPRESSION:  No evidence of appendicitis or other significant abnormality within  the abdomen or pelvis.  Mild airspace opacity in the posterior right lower lobe, suspicious  for pneumonia or other inflammatory process.  CXR: IMPRESSION:  No active cardiopulmonary disease.  Wenda Low, MD 07/27/2013, 9:44 AM PGY-1, [facility] FPTS Intern pager: [phone], text pages welcome

## 2013-07-28 NOTE — Progress Notes (Signed)
FMTS Attending Note Patient seen and examined by me, discussed with resident team and I agree with Dr. Whitman Hero assessment and plan.  See my separate note for details.  Paula Compton, MD

## 2013-07-28 NOTE — Progress Notes (Signed)
Report called to [facility], receiving RN on  6 north. VSS. Transferred to 6N16 via wheelchair with personal belongings.   [facility]

## 2013-07-28 NOTE — Progress Notes (Signed)
FMTS Attending Note Patient seen and examined by me, she reports to feeling better this morning.  Abdomen still sore but less painful than before.  Is having BMs.  Cough is mild, productive.  She has an appetite to eat but complains the food is lousy.  No shortness of breath.  AG is closed; she has been transitioned back to her home dose of 70/30 insulin.  Plan to transfer out of SDU; continue watching CBGs.  2-view CXR today; she is being treated for a presumed CAP based upon her Abd CT findings of R basilar infiltrate, cough and marked white count on admission.  I don't see a CBC done for today, would recheck to see trend of WBC.  Paula Compton, MD

## 2013-07-29 LAB — BASIC METABOLIC PANEL
BUN: 12 mg/dL (ref 6–23)
CO2: 19 mEq/L (ref 19–32)
Chloride: 104 mEq/L (ref 96–112)
Creatinine, Ser: 0.58 mg/dL (ref 0.50–1.10)
GFR calc non Af Amer: >90 mL/min (ref >90)
Glucose, Bld: 297 mg/dL — ABNORMAL HIGH (ref 70–99)
Potassium: 4.4 mEq/L (ref 3.5–5.1)

## 2013-07-29 LAB — CBC
HCT: 36.3 % (ref 36.0–46.0)
Hemoglobin: 12.3 g/dL (ref 12.0–15.0)
MCH: 30.9 pg (ref 26.0–34.0)
MCV: 91.2 fL (ref 78.0–100.0)
RBC: 3.98 MIL/uL (ref 3.87–5.11)
RDW: 14.9 % (ref 11.5–15.5)
WBC: 3.6 10*3/uL — ABNORMAL LOW (ref 4.0–10.5)

## 2013-07-29 LAB — GLUCOSE, CAPILLARY
Glucose-Capillary: 272 mg/dL — ABNORMAL HIGH (ref 70–99)
Glucose-Capillary: 223 mg/dL — ABNORMAL HIGH (ref 70–99)

## 2013-07-29 MED ORDER — INSULIN ASPART PROT & ASPART (70-30 MIX) 100 UNIT/ML ~~LOC~~ SUSP
13.0000 [IU] | Freq: Two times a day (BID) | SUBCUTANEOUS | Status: DC
  Administered 2013-07-29: 13 [IU] via SUBCUTANEOUS

## 2013-07-29 MED ORDER — IBUPROFEN 600 MG PO TABS: 600.0000 mg | ORAL_TABLET | Freq: Four times a day (QID) | ORAL | Status: DC | PRN

## 2013-07-29 MED ORDER — AZITHROMYCIN 250 MG PO TABS: ORAL_TABLET | ORAL | Status: DC

## 2013-07-29 MED ORDER — INSULIN ASPART PROT & ASPART (70-30 MIX) 100 UNIT/ML ~~LOC~~ SUSP: 13.0000 [IU] | Freq: Two times a day (BID) | SUBCUTANEOUS | Status: DC

## 2013-07-29 NOTE — Progress Notes (Signed)
Contacted regarding stomach and head pain, which is unrelieved by current pain medications.

## 2013-07-29 NOTE — Progress Notes (Signed)
Inpatient Diabetes Program Recommendations  AACE/ADA: New Consensus Statement on Inpatient Glycemic Control (2013)  Target Ranges:  Prepandial:   less than 140 mg/dL      Peak postprandial:   less than 180 mg/dL (1-2 hours)      Critically ill patients:  140 - 180 mg/dL   Reason for Visit: Spoke at length to patient regarding her diabetes and DKA.  She states that she has been diagnosed with diabetes for 1 year and states that "everyone" in her family has diabetes also (except her twin sister).  She currently has no insurance but feels hopeful since she spoke with the financial counselor regarding Medicaid eligibility.  She states that she has been sharing insulin with her mother but ran out.  Explained physiology of DKA and cellular starvation.  She states that she cannot gain weight.  Discussed break down of muscle and fat when DKA occurs and acidosis.  Patient needs close follow-up with PCP and possibly a consult with endocrinologist.   She seems frustrated with the diabetes disease process and states "I have no energy".  Explained that this is due to high CBG's and that this is why improved glycemic control is so important.  She states that she can afford 70/30 insulin ($24.88/vial).  Would be better controlled on basal/bolus regimen, however cost is an issue.  Once she follows-up with PCP, maybe she could get patient assistance program for basal/bolus?  She does have a meter from [employer] with strips.  Needs Outpatient diabetes education also so will order per protocol.  Beryl Meager, RN, BC-ADM Inpatient Diabetes Coordinator Pager [phone]

## 2013-07-29 NOTE — Progress Notes (Signed)
FMTS Attending Note Patient seen and examined by me today, discussed with resident team and I agree with Dr Whitman Hero assessment and plan for today. Plan to discharge to home on 70/30 insulin; complete CAP treatment with azithromycin and close follow up. Paula Compton, MD

## 2013-07-29 NOTE — Progress Notes (Signed)
Family Medicine Teaching Service Daily Progress Note Intern Pager: [phone]  Patient name: Kristine Allison Medical record number: [id number] Date of birth: Apr 13, 1981 Age: 32 y.o. Gender: female  Primary Care Provider: Janit Pagan, MD Consultants: None Code Status: Full  Pt Overview and Major Events to Date:   Assessment and Plan: Kristine Allison is a 32 y.o. female presenting with DKA and abdominal pain . PMH is significant for poorly controlled DM type 1 and Asthma.   # DKA  - On admission Somnolent & Tachycardic w/ Glucose: 893, venous pH 6.9; Bicarb 4.5; AG: 41. CXR in ED: No active cardiopulmonary disease; UA: Nit, Leuk (-). EKG: Sinus tachycardia w/ QTc 507. Was Hospitalized 06/05/13 w/ DKA. Had been sharing Insulin w/ her mother due to cost and switched to 70/30, and CM consulted for applicant for Medicaid. Missed Hf/u wit PCP. S/p 3L NS bolus in the ED.  - Trigger: Medication non adherence vs possible infection (see CAP pain below)  - Trend BMETs; Monitor and replete K as need  - Strict I&O  - Care management: Provided orange card contact info - Insulin: 70/30 Inc to 13 units BID (pts home regime) will adjust as needed  - BGs 80-297 (SSI: 3 & 5 on 11/6) - BMET Na 133; Gluc 297 otherwise wnl  # CAP - Cough on admission with leukocytosis and Multiple sick contacts. CT revealed Mild airspace opacity in the posterior right lower lobe, suspicious for pneumonia  - CXR: No active cardiopulmonary disease - CTX and Azithro (Day 3/5) - blood cultures: NG x 3 days - Leukopenia 3.6 & Thrombocytopenia 128  DDx: Viral sequela most likely; Considering HIT vs ITP vs Drug induced (tylenol, ibuprofen)   HIT: 4T's score 4 (Intermediate probability):   Heparin stopped  # AKI: Resolved - Cr 0.58 improved from 1.51 on admission. Most likely prerenal due to dehydration (BUN/Cr ratio 330)   # Abdominal Pain: Resolved - Periumbilical pain w/o guarding or rebounding.  Leukocytosis: 22.5 on admission. Hx limited by pt's somnolence  - DDx: Pain due to DKA vs appendicitis vs viral gastroenteritis  - CT abdomen/pelvis:   No evidence of appendicitis or other significant abnormality within the abdomen or pelvis.   Mild airspace opacity in the posterior right lower lobe, suspicious for pneumonia or other inflammatory process. - No fevers,  - Lipase: wnl  # Somnolence: Resolved - easily rousable; oriented to person, place and situtation only on admission - Likely related to DKA and acidosis. May also be related to an acute infection (see above).   FEN/GI:  Diet: Carb Mod IVF: KVO Prophylaxis: SCDs  Disposition: Home pending Resolution of DKA  Subjective: Feeling back to her normal self. Mild abdominal pain "she says from coughing which is improving".  Objective: Temp:  [97.6 F (36.4 C)-99.1 F (37.3 C)] 98.7 F (37.1 C) (11/05 0800) Pulse Rate:  [101-123] 101 (11/05 0800) Resp:  [13-28] 16 (11/05 0800) BP: (95-132)/(45-68) 106/58 mmHg (11/05 0800) SpO2:  [99 %-100 %] 100 % (11/05 0800) Weight:  [113 lb 5.1 oz (51.4 kg)] 113 lb 5.1 oz (51.4 kg) (11/04 2206) Physical Exam:  Gen: F NAD; Afebrile HEENT: MMM; Sclera white; Conjunctiva pink; EOMI; PERRLA CV: RRR, No m/r/g; No LE Edema  Lungs: CTAB;  GI: No skin changes; BS +; No rebounding or guarding, no peritoneal signs; small bruising from insulin shots Lt arm: Moderate Bruising above and below IV site Lower Ext: No skin changes; No edema   Laboratory:  Recent Labs  Results for orders placed during the hospital encounter of 07/26/13 (from the past 24 hour(s))  GLUCOSE, CAPILLARY     Status: Abnormal   Collection Time    07/28/13 12:12 PM      Result Value Range   Glucose-Capillary 199 (*) 70 - 99 mg/dL  CBC     Status: Abnormal   Collection Time    07/28/13  3:12 PM      Result Value Range   WBC 5.2  4.0 - 10.5 K/uL   RBC 3.69 (*) 3.87 - 5.11 MIL/uL   Hemoglobin 11.8 (*) 12.0 - 15.0  g/dL   HCT 40.9 (*) 81.1 - 91.4 %   MCV 89.7  78.0 - 100.0 fL   MCH 32.0  26.0 - 34.0 pg   MCHC 35.6  30.0 - 36.0 g/dL   RDW 78.2  95.6 - 21.3 %   Platelets 136 (*) 150 - 400 K/uL  BASIC METABOLIC PANEL     Status: Abnormal   Collection Time    07/28/13  3:12 PM      Result Value Range   Sodium 133 (*) 135 - 145 mEq/L   Potassium 3.9  3.5 - 5.1 mEq/L   Chloride 106  96 - 112 mEq/L   CO2 19  19 - 32 mEq/L   Glucose, Bld 252 (*) 70 - 99 mg/dL   BUN 10  6 - 23 mg/dL   Creatinine, Ser 0.86  0.50 - 1.10 mg/dL   Calcium 8.4  8.4 - 57.8 mg/dL   GFR calc non Af Amer >90  >90 mL/min   GFR calc Af Amer >90  >90 mL/min  GLUCOSE, CAPILLARY     Status: Abnormal   Collection Time    07/28/13  5:34 PM      Result Value Range   Glucose-Capillary 229 (*) 70 - 99 mg/dL  GLUCOSE, CAPILLARY     Status: None   Collection Time    07/28/13  9:44 PM      Result Value Range   Glucose-Capillary 80  70 - 99 mg/dL   Comment 1 Notify RN    CBC     Status: Abnormal   Collection Time    07/29/13  4:55 AM      Result Value Range   WBC 3.6 (*) 4.0 - 10.5 K/uL   RBC 3.98  3.87 - 5.11 MIL/uL   Hemoglobin 12.3  12.0 - 15.0 g/dL   HCT 46.9  62.9 - 52.8 %   MCV 91.2  78.0 - 100.0 fL   MCH 30.9  26.0 - 34.0 pg   MCHC 33.9  30.0 - 36.0 g/dL   RDW 41.3  24.4 - 01.0 %   Platelets 128 (*) 150 - 400 K/uL  BASIC METABOLIC PANEL     Status: Abnormal   Collection Time    07/29/13  4:55 AM      Result Value Range   Sodium 133 (*) 135 - 145 mEq/L   Potassium 4.4  3.5 - 5.1 mEq/L   Chloride 104  96 - 112 mEq/L   CO2 19  19 - 32 mEq/L   Glucose, Bld 297 (*) 70 - 99 mg/dL   BUN 12  6 - 23 mg/dL   Creatinine, Ser 2.72  0.50 - 1.10 mg/dL   Calcium 8.4  8.4 - 53.6 mg/dL   GFR calc non Af Amer >90  >90 mL/min   GFR calc Af Amer >90  >90 mL/min  Imaging/Diagnostic Tests: CT AB/Pelvis IMPRESSION:  No evidence of appendicitis or other significant abnormality within  the abdomen or pelvis.  Mild airspace  opacity in the posterior right lower lobe, suspicious  for pneumonia or other inflammatory process.  CXR: IMPRESSION:  No active cardiopulmonary disease.  Wenda Low, MD 07/27/2013, 9:44 AM PGY-1, [facility] FPTS Intern pager: [phone], text pages welcome

## 2013-07-29 NOTE — Discharge Summary (Signed)
Patient seen and examined by me on day of discharge and discussed with resident team; I agree with plan for discharge per Dr Whitman Hero note. Paula Compton, MD

## 2013-07-29 NOTE — Progress Notes (Signed)
Discharge Note. Pt given discharge instructions and follow up information. Pt asked appropriate questions. Pt shared that she is feeling much better and is looking forward to going home. Pt ready for discharge.

## 2013-08-01 ENCOUNTER — Encounter: Payer: Self-pay | Admitting: Family Medicine

## 2013-08-01 ENCOUNTER — Ambulatory Visit (INDEPENDENT_AMBULATORY_CARE_PROVIDER_SITE_OTHER): Payer: Self-pay | Admitting: Family Medicine

## 2013-08-01 VITALS — BP 121/83 | HR 74 | Temp 99.1°F | Ht 65.0 in | Wt 129.0 lb

## 2013-08-01 DIAGNOSIS — J189 Pneumonia, unspecified organism: Secondary | ICD-10-CM

## 2013-08-01 DIAGNOSIS — F17200 Nicotine dependence, unspecified, uncomplicated: Secondary | ICD-10-CM

## 2013-08-01 DIAGNOSIS — E119 Type 2 diabetes mellitus without complications: Secondary | ICD-10-CM

## 2013-08-01 NOTE — Assessment & Plan Note (Signed)
Type 1 listed in problem list but unsure if that is her true diagnosis.  Will continue to titrate insulin and monitor her blood sugar.  To follow up with PCP

## 2013-08-01 NOTE — Progress Notes (Signed)
  Subjective:    Patient ID: Kristine Allison, female    DOB: Apr 07, 1981, 32 y.o.   MRN: [id number]  HPI  Follow up hospital for  DIABETES Disease Monitoring: Blood Sugar ranges-175 to 300s Polyuria/phagia/dipsia- no      Visual problems- no Medications: Compliance- 13 u 70/30 twice daily  Hypoglycemic symptoms- no. Eats all the time. Works 3rd shift sleeps only about 6 hours from Noon to 5-6 PM.  No specific fasting blood sugar testing  Pneumonia Diagnosed in hospital.  Has not picked up azithro Rx yet.  No fever or shortness of breath or feeling badly.  Is back working regularly  Review of Symptoms - see HPI  PMH - Smoking status noted.      Review of Systems     Objective:   Physical Exam Alert no acute distress Lungs - few crackles at both bases Heart - Regular rate and rhythm.  No murmurs, gallops or rubs.    Extremities:  No cyanosis, pedal edema, or deformity noted with good range of motion of all major joints.          Assessment & Plan:

## 2013-08-01 NOTE — Assessment & Plan Note (Signed)
Seems to have been treated.  Urged to finish antibiotics

## 2013-08-01 NOTE — Patient Instructions (Signed)
Good to see you today!  Thanks for coming in.  Go up to 15 units of Insulin twice daily and keep a record of your fasting blood sugar (when you first wake up).  If all your readings are greater than 150 then to up to 17 units twice daily   Get and finish the antibiotics Azithromycin  See Dr Lum Babe in 2-3 weeks  Consider what would have to happen for you to stop smoking

## 2013-08-02 LAB — CULTURE, BLOOD (ROUTINE X 2): Culture: NO GROWTH

## 2013-09-08 ENCOUNTER — Ambulatory Visit (INDEPENDENT_AMBULATORY_CARE_PROVIDER_SITE_OTHER): Payer: Self-pay | Admitting: Family Medicine

## 2013-09-08 ENCOUNTER — Encounter: Payer: Self-pay | Admitting: Family Medicine

## 2013-09-08 VITALS — BP 93/66 | HR 101 | Temp 97.9°F | Ht 65.0 in | Wt 105.0 lb

## 2013-09-08 DIAGNOSIS — E1010 Type 1 diabetes mellitus with ketoacidosis without coma: Secondary | ICD-10-CM

## 2013-09-08 DIAGNOSIS — I959 Hypotension, unspecified: Secondary | ICD-10-CM

## 2013-09-08 DIAGNOSIS — E119 Type 2 diabetes mellitus without complications: Secondary | ICD-10-CM

## 2013-09-08 DIAGNOSIS — F329 Major depressive disorder, single episode, unspecified: Secondary | ICD-10-CM

## 2013-09-08 DIAGNOSIS — R634 Abnormal weight loss: Secondary | ICD-10-CM

## 2013-09-08 DIAGNOSIS — M79609 Pain in unspecified limb: Secondary | ICD-10-CM

## 2013-09-08 DIAGNOSIS — M79606 Pain in leg, unspecified: Secondary | ICD-10-CM | POA: Insufficient documentation

## 2013-09-08 LAB — TSH: TSH: 2.346 u[IU]/mL (ref 0.350–4.500)

## 2013-09-08 LAB — CBC WITH DIFFERENTIAL/PLATELET
Basophils Relative: 0 % (ref 0–1)
Eosinophils Absolute: 0.1 10*3/uL (ref 0.0–0.7)
Eosinophils Relative: 1 % (ref 0–5)
Lymphocytes Relative: 20 % (ref 12–46)
MCH: 31.5 pg (ref 26.0–34.0)
MCHC: 34.9 g/dL (ref 30.0–36.0)
MCV: 90.2 fL (ref 78.0–100.0)
Monocytes Relative: 11 % (ref 3–12)
Neutrophils Relative %: 68 % (ref 43–77)
Platelets: 249 10*3/uL (ref 150–400)

## 2013-09-08 LAB — POCT GLYCOSYLATED HEMOGLOBIN (HGB A1C): Hemoglobin A1C: >14

## 2013-09-08 LAB — BASIC METABOLIC PANEL
BUN: 23 mg/dL (ref 6–23)
Calcium: 9.8 mg/dL (ref 8.4–10.5)
Chloride: 91 mEq/L — ABNORMAL LOW (ref 96–112)
Creat: 1 mg/dL (ref 0.50–1.10)
Glucose, Bld: 418 mg/dL — ABNORMAL HIGH (ref 70–99)
Sodium: 129 mEq/L — ABNORMAL LOW (ref 135–145)

## 2013-09-08 MED ORDER — METFORMIN HCL ER 500 MG PO TB24: 1000.0000 mg | ORAL_TABLET | Freq: Every day | ORAL | Status: DC

## 2013-09-08 MED ORDER — INSULIN ASPART PROT & ASPART (70-30 MIX) 100 UNIT/ML ~~LOC~~ SUSP: 20.0000 [IU] | Freq: Two times a day (BID) | SUBCUTANEOUS | Status: DC

## 2013-09-08 NOTE — Assessment & Plan Note (Signed)
Mild to moderate depression. Antidepressant and counseling recommended. She declined both. Patient seem stable for now. We will monitor.

## 2013-09-08 NOTE — Assessment & Plan Note (Signed)
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 Poorly controlled despite compliance with Novolog. A1C >4 with CBG about 300. Advised increase Novolog to 20 unit BID. Start Metformin 1000 mg qd. Continue home capillary glucose monitor. F/U in 4 wks for reassessment.

## 2013-09-08 NOTE — Assessment & Plan Note (Signed)
Likely due to over use from prolonged standing. Occasional LL rest recommended. Tylenol prn pain. LL messaging as well.

## 2013-09-08 NOTE — Assessment & Plan Note (Signed)
Likely due to reduced dietary intake. SOme element of depression related to weight loss. TSH checked in addition to CBC to further assess her weight loss. Dietary counseling done. Advised to increase oral intake. She will work on this.

## 2013-09-08 NOTE — Progress Notes (Signed)
Subjective:     Patient ID: Kristine Allison, female   DOB: 11-May-1981, 32 y.o.   MRN: [id number]  HPI DM:Here for follow up, currently on Novolog 17 unit BID,home capillary glucose runs between 220 and 300,denies hypoglycemic episodes. Leg pain: C/O B/L LL pain from knee to the calf,pain is mostly worse on her right LL. Pain 7/10 in severity,aching and cramping in nature. Prior episode in the past which resolved by itself, she usually uses goody powder which helps a lot. Patient mentioned she worked at an [employer] where she stands for a prolong period daily for about 8 hrs.Denies any recent injury or fall. Weight loss: C/O weight loss in the last few week, she has not been eating well due to loss of appetite,feels weak and tired most of the time,she denies any GI symptoms,no blood in stool. She has not had anything to eat today but has been drinking soda.  Depression: Patient had felt depressed for a long time,this she attributed to her DM and the need to be on medication,she denies any suicidal ideation,she had never been on antidepressant. She feels she is handling it well. Hypotension: Denies prior episode. She has not had anything to eat today. Appetite has been low.  Current Outpatient Prescriptions on File Prior to Visit  Medication Sig Dispense Refill  . albuterol (PROAIR HFA) 108 (90 BASE) MCG/ACT inhaler Inhale 2 puffs into the lungs 2 (two) times daily as needed for wheezing or shortness of breath.       No current facility-administered medications on file prior to visit.   Past Medical History  Diagnosis Date  . Asthma   . Diabetes mellitus without complication     Review of Systems  Respiratory: Negative.   Cardiovascular: Negative.   Gastrointestinal: Negative.   Musculoskeletal: Positive for arthralgias.  Neurological: Negative.   Psychiatric/Behavioral: Negative for suicidal ideas. The patient is not nervous/anxious.        Depressed mood.  All other systems reviewed  and are negative.   Filed Vitals:   09/08/13 1356  BP: 93/66  Pulse: 101  Temp: 97.9 F (36.6 C)  TempSrc: Oral  Height: 5\' 5"  (1.651 m)  Weight: 105 lb (47.628 kg)       Objective:   Physical Exam  Nursing note and vitals reviewed. Constitutional: She is oriented to person, place, and time. She appears well-developed. No distress.  Cardiovascular: Normal rate, regular rhythm, normal heart sounds and intact distal pulses.   No murmur heard. Pulmonary/Chest: Effort normal and breath sounds normal. No respiratory distress. She has no wheezes. She exhibits no tenderness.  Abdominal: Soft. Bowel sounds are normal. She exhibits no distension and no mass. There is no tenderness.  Musculoskeletal: Normal range of motion. She exhibits no edema.       Right knee: Normal.       Left knee: Normal.       Right lower leg: Normal.       Left lower leg: Normal.  Neurological: She is alert and oriented to person, place, and time.   PHQ9 score of 5    Assessment:     DM Leg pain: B/L Weight loss Depression Hypotension     Plan:     Check problem list.

## 2013-09-08 NOTE — Assessment & Plan Note (Signed)
Likely due to poor oral intake. Good hydration and increase dietary intake recommended. Patient currently is asymptomatic, will monitor for now. Advised to RTC soon or go to the ED if symptomatic.

## 2013-09-08 NOTE — Patient Instructions (Signed)
Calorie Counting Diet A calorie counting diet requires you to eat the number of calories that are right for you in a day. Calories are the measurement of how much energy you get from the food you eat. Eating the right amount of calories is important for staying at a healthy weight. If you eat too many calories, your body will store them as fat and you may gain weight. If you eat too few calories, you may lose weight. Counting the number of calories you eat during a day will help you know if you are eating the right amount. A Registered Dietitian can determine how many calories you need in a day. The amount of calories needed varies from person to person. If your goal is to lose weight, you will need to eat fewer calories. Losing weight can benefit you if you are overweight or have health problems such as heart disease, high blood pressure, or diabetes. If your goal is to gain weight, you will need to eat more calories. Gaining weight may be necessary if you have a certain health problem that causes your body to need more energy. TIPS Whether you are increasing or decreasing the number of calories you eat during a day, it may be hard to get used to changes in what you eat and drink. The following are tips to help you keep track of the number of calories you eat.  Measure foods at home with measuring cups. This helps you know the amount of food and number of calories you are eating.  Restaurants often serve food in amounts that are larger than 1 serving. While eating out, estimate how many servings of a food you are given. For example, a serving of cooked rice is  cup or about the size of half of a fist. Knowing serving sizes will help you be aware of how much food you are eating at restaurants.  Ask for smaller portion sizes or child-size portions at restaurants.  Plan to eat half of a meal at a restaurant. Take the rest home or share the other half with a friend.  Read the Nutrition Facts panel on  food labels for calorie content and serving size. You can find out how many servings are in a package, the size of a serving, and the number of calories each serving has.  For example, a package might contain 3 cookies. The Nutrition Facts panel on that package says that 1 serving is 1 cookie. Below that, it will say there are 3 servings in the container. The calories section of the Nutrition Facts label says there are 90 calories. This means there are 90 calories in 1 cookie (1 serving). If you eat 1 cookie you have eaten 90 calories. If you eat all 3 cookies, you have eaten 270 calories (3 servings x 90 calories = 270 calories). The list below tells you how big or small some common portion sizes are.  1 oz.........4 stacked dice.  3 oz.........Deck of cards.  1 tsp........Tip of little finger.  1 tbs........Thumb.  2 tbs........Golf ball.   cup.......Half of a fist.  1 cup........A fist. KEEP A FOOD LOG Write down every food item you eat, the amount you eat, and the number of calories in each food you eat during the day. At the end of the day, you can add up the total number of calories you have eaten. It may help to keep a list like the one below. Find out the calorie information by reading the   Nutrition Facts panel on food labels. Breakfast  Bran cereal (1 cup, 110 calories).  Fat-free milk ( cup, 45 calories). Snack  Apple (1 medium, 80 calories). Lunch  Spinach (1 cup, 20 calories).  Tomato ( medium, 20 calories).  Chicken breast strips (3 oz, 165 calories).  Shredded cheddar cheese ( cup, 110 calories).  Light Italian dressing (2 tbs, 60 calories).  Whole-wheat bread (1 slice, 80 calories).  Tub margarine (1 tsp, 35 calories).  Vegetable soup (1 cup, 160 calories). Dinner  Pork chop (3 oz, 190 calories).  Brown rice (1 cup, 215 calories).  Steamed broccoli ( cup, 20 calories).  Strawberries (1  cup, 65 calories).  Whipped cream (1 tbs, 50  calories). Daily Calorie Total: 1425 Document Released: 09/08/2005 Document Revised: 12/01/2011 Document Reviewed: 03/05/2007 [employer] Patient Information 2014 [employer].  

## 2013-09-09 ENCOUNTER — Telehealth: Payer: Self-pay | Admitting: Family Medicine

## 2013-09-09 NOTE — Telephone Encounter (Signed)
I spoke with patient about her Glucose level and A1C. She denies any concern except that she is out of her insulin and cannot afford the Novolog. She called her pharmacy and was told her Novolog now cost $240 and Metformin cost $4. I called her [facility] pharmacy to discuss her options. As it is now she can only afford the metformin which is $4,the pharmacy informed me there is an option for a free Novolin which is less potent than the Novolog. She can get this for free at the pharmacy. I called patient back and advised she pick up her Metformin and ask for Novolin to use 20 unit am, 10 unit pm and 20 unit qhs. Plan to check capillary glucose prior to injection, if less than 70 tolf hold insulin. She is aware to go to the ED if symptomatic. Patient verbalized all understanding.

## 2013-09-09 NOTE — Telephone Encounter (Signed)
Pt returned dr Lum Babe call. She also needs a cheaper insulin. She is also out of her insulin

## 2013-09-09 NOTE — Telephone Encounter (Signed)
Spoke with pt. Kristine Allison

## 2013-09-09 NOTE — Telephone Encounter (Signed)
I called to speak with patient about her lab report on the phone number on record. Her mom gave another number to reach her which is [phone]. Message left for her to call to discuss her high glucose level and low sodium which is due to high glucose. Corrected sodium level is 134.09 with initial level of 129. Plan is to increase Novolog to 20 unit Qam and Qhs and 10 unit qpm.

## 2013-09-22 DIAGNOSIS — I214 Non-ST elevation (NSTEMI) myocardial infarction: Secondary | ICD-10-CM

## 2013-09-22 HISTORY — DX: Non-ST elevation (NSTEMI) myocardial infarction: I21.4

## 2013-10-03 ENCOUNTER — Encounter: Payer: Self-pay | Attending: Family Medicine | Admitting: *Deleted

## 2013-10-03 ENCOUNTER — Encounter: Payer: Self-pay | Admitting: *Deleted

## 2013-10-03 VITALS — Ht 64.0 in | Wt 105.0 lb

## 2013-10-03 DIAGNOSIS — E1010 Type 1 diabetes mellitus with ketoacidosis without coma: Secondary | ICD-10-CM | POA: Insufficient documentation

## 2013-10-03 DIAGNOSIS — Z713 Dietary counseling and surveillance: Secondary | ICD-10-CM | POA: Insufficient documentation

## 2013-10-03 NOTE — Progress Notes (Signed)
Appt start time: 1130 end time:  1230.  Assessment:  Patient was seen on  10/03/13 for individual diabetes education. Lives with parents, they share food shopping and cooking. She works at [employer] 3rd shift form 10 PM to 6 AM. No set sleep pattern, naps as able. SMBG 3 times a day with reported range of 160-220 mg/dl. Less frequent symptoms of polyuria and thirst. States she continues to feel very tired. She also states she was recently on Novolog 70/30 but could not afford it so she is now on Novolin 70/30 and is taking it before each meal. She states she received very good information on carb counting in the hospital and feels confident that she understands it at this time.  Current HbA1c: 14.5% on 06/06/13  Preferred Learning Style:   No preference indicated   Learning Readiness:   Ready  Change in progress  MEDICATIONS: see list. Current diabetes medications include Novolin 70/30 @ 20 in AM, 15 @ lunch and 20 @ supper. Metformin  DIETARY INTAKE:  24-hr recall:  B ( 12 PM): sandwich, diet soda  Snk ( AM): none  L (6 PM): meat, vegetables, not usually any starch due to preferences. Diet soda Snk ( PM): none D ( PM): grabs as able, states she is not allowed breaks while working 3rd shift. Snk ( PM): none Beverages: diet soda, water  Usual physical activity: not at this time  Estimated energy needs: 1600 calories 180 g carbohydrates 120 g protein 44 g fat  Intervention:  Nutrition counseling provided.  Discussed diabetes disease process and treatment options.  Discussed physiology of both Type 1 and Type 2 diabetes.  Discussed role of medications and diet in glucose control  Postponed education on macronutrients on glucose levels due to time constraints of this visit.  Provided handout on carb counting, importance of regularly scheduled meals/snacks, and meal planning  Discussed effects of physical activity on glucose levels and long-term glucose control.   Reviewed  patient medications.  Discussed role of medication on blood glucose and possible side effects. Discussed insulin action in detail and importance of insulin to prevent DKA in the future.  Discussed blood glucose monitoring and interpretation.  Discussed recommended target ranges and individual ranges.    Described short-term complications: hyper- and hypo-glycemia.  Discussed causes,symptoms, and treatment options.  Provided information on the following:  Prevention, detection, and treatment of long-term complications.  Discussed the role of prolonged elevated glucose levels on body systems.  Role of stress on blood glucose levels and discussed strategies to manage psychosocial issues.  Recommendations for long-term diabetes self-care.  Established checklist for medical, dental, and emotional self-care.  Teaching Method Utilized: Visual, Auditory  Handouts given during visit include: Living Well with Diabetes, which she states she has a copy at home from the hospital Carb Counting and Food Label handouts  Insulin Action Handout  Barriers to learning/adherence to lifestyle change: lack of health insurance to pay for care and Rx's  Diabetes self-care support plan:   [facility] DM 1 support group  Further education and support  Demonstrated degree of understanding via:  Teach Back   Monitoring/Evaluation:  Dietary intake, exercise, SMBG, and body weight prn. Offered patient to email me with any questions or concerns if not able to make an appointment in the future.

## 2013-10-06 ENCOUNTER — Ambulatory Visit (INDEPENDENT_AMBULATORY_CARE_PROVIDER_SITE_OTHER): Payer: Self-pay | Admitting: Family Medicine

## 2013-10-06 ENCOUNTER — Encounter: Payer: Self-pay | Admitting: Family Medicine

## 2013-10-06 VITALS — BP 112/76 | HR 103 | Temp 98.2°F | Wt 106.0 lb

## 2013-10-06 DIAGNOSIS — M79609 Pain in unspecified limb: Secondary | ICD-10-CM

## 2013-10-06 DIAGNOSIS — R634 Abnormal weight loss: Secondary | ICD-10-CM

## 2013-10-06 DIAGNOSIS — E119 Type 2 diabetes mellitus without complications: Secondary | ICD-10-CM

## 2013-10-06 DIAGNOSIS — M79606 Pain in leg, unspecified: Secondary | ICD-10-CM

## 2013-10-06 NOTE — Patient Instructions (Signed)
It was nice seeing you today,you seem to be doing great with your diabetes,I am also impressed you had gained 1 lbs, please continue your Novolin at current dose for now, whenever you need refill let us know, I will also contact your pharmacy. Please follow up with me in 3 months.

## 2013-10-06 NOTE — Assessment & Plan Note (Signed)
Gained 1 lbs since last visit. Patient commended on this. Continue f/u with nutritionist and follow diet recommendation.

## 2013-10-06 NOTE — Assessment & Plan Note (Addendum)
Improved a lot from last visit. Compliant with medication and diet. She had mentioned she is actually DM 2 and not 1. Currently on Novolin and metformin. Continue this for now if she continue to do well on Insulin might d/c the Metformin. F/U in 2-3 months for repeat A1C. Could not do eye exam or ophthalmology referral due to insurance issue. Working on getting orange card.

## 2013-10-06 NOTE — Assessment & Plan Note (Signed)
Assessed for this during last visit. Seem to be intermittent. As mentioned before it could be due to over use,but with jumpy LL could also be restless leg syndrome. Plan to continue Tylenol prn. If pain persist, will check for Vit D level as well as CK for myalgia. F/U sooner if worsening.

## 2013-10-06 NOTE — Progress Notes (Signed)
Subjective:     Patient ID: Kristine Allison, female   DOB: [birth year], 33 y.o.   MRN: [id number]  HPI DM: Here for follow up,currently on novolin 20 unit am and qhs and 15 unit pm,her capillary glucose level had improved a lot, she is happy about her regimen,she is also on metformin 1068m daily. She had mentioned she actually has type 2 DM.  leg pain: C/O B/L  LL pain from knee down about 5/10 in severity,pain started last week on and off,worse at night associated with jumpy legs especially when in bed. During the day when she is walking around her symptom improves.She tried Tylenol PM with some improvement. Weight loss: She is working on gaining weight,since last visit she had improved on her diet and also met with the nutritionist,she feel she has not lost anymore weight.  Current Outpatient Prescriptions on File Prior to Visit  Medication Sig Dispense Refill  . metFORMIN (GLUCOPHAGE-XR) 500 MG 24 hr tablet Take 2 tablets (1,000 mg total) by mouth daily with breakfast.  60 tablet  4  . albuterol (PROAIR HFA) 108 (90 BASE) MCG/ACT inhaler Inhale 2 puffs into the lungs 2 (two) times daily as needed for wheezing or shortness of breath.       No current facility-administered medications on file prior to visit.   Past Medical History  Diagnosis Date  . Asthma   . Diabetes mellitus without complication      Review of Systems  Respiratory: Negative.   Cardiovascular: Negative.   Musculoskeletal: Positive for myalgias.       Leg  Pain.  All other systems reviewed and are negative.   Filed Vitals:   10/06/13 1546  BP: 112/76  Pulse: 103  Temp: 98.2 F (36.8 C)  TempSrc: Oral  Weight: 106 lb (48.081 kg)       Objective:   Physical Exam  Nursing note and vitals reviewed. Constitutional: She is oriented to person, place, and time. She appears well-developed. No distress.  Cardiovascular: Normal rate, regular rhythm, normal heart sounds and intact distal pulses.   No murmur  heard. Pulmonary/Chest: Effort normal and breath sounds normal. No respiratory distress. She has no wheezes.  Abdominal: Soft. Bowel sounds are normal. She exhibits no distension and no mass. There is no tenderness.  Musculoskeletal: Normal range of motion. She exhibits no edema and no tenderness.  Neurological: She is alert and oriented to person, place, and time.  Psychiatric: She has a normal mood and affect.       Assessment/Plan:     DM Leg pain Weight loss

## 2013-10-18 ENCOUNTER — Telehealth: Payer: Self-pay | Admitting: Family Medicine

## 2013-10-18 ENCOUNTER — Other Ambulatory Visit: Payer: Self-pay | Admitting: Family Medicine

## 2013-10-18 MED ORDER — INSULIN NPH ISOPHANE & REGULAR (70-30) 100 UNIT/ML ~~LOC~~ SUSP: SUBCUTANEOUS | Status: DC

## 2013-10-18 NOTE — Telephone Encounter (Signed)
Pt called and needs a refill on her insulin. She is completely out. Myriam Jacobsonjw

## 2013-10-18 NOTE — Telephone Encounter (Signed)
Novolin e-prescribed,inform patient to pick up medication today.

## 2013-10-18 NOTE — Telephone Encounter (Signed)
Lm for pt that this was done. Abdulmalik Darco,CMA

## 2013-10-18 NOTE — Telephone Encounter (Signed)
Pt called because the refill that was called in today cost 20.00 and she thought that it was supposed to be free. Can we check into this. jw

## 2013-10-19 NOTE — Telephone Encounter (Signed)
Please let patient know we cannot control how much pharmacy charge on medication,it is based on insurance if there is any and the pharmacy,please advise her to call her pharmacy and check on this.

## 2013-10-19 NOTE — Telephone Encounter (Signed)
Pt informed. [name], Kristine Allison  

## 2013-10-19 NOTE — Telephone Encounter (Signed)
I just spoke with the pharmacist,without insurance their price for Novolin is $20. They are not going to reduce the price. It might be cheaper or even $0 with insurance.

## 2013-10-19 NOTE — Telephone Encounter (Signed)
Pt states that last time Dr. Lum BabeEniola was able to call and get her novilin changed so that it was free.  Attempted to call pharmacy x 2 but no machine and no answer.  Will forward to MD. Milas GainFleeger, Maryjo RochesterJessica Dawn

## 2013-11-15 ENCOUNTER — Inpatient Hospital Stay (HOSPITAL_COMMUNITY)
Admission: EM | Admit: 2013-11-15 | Discharge: 2013-11-20 | DRG: 637 | Disposition: A | Discharge status: Home / Self Care | Payer: Self-pay | Attending: Family Medicine | Admitting: Family Medicine

## 2013-11-15 ENCOUNTER — Emergency Department (HOSPITAL_COMMUNITY): Payer: Self-pay

## 2013-11-15 ENCOUNTER — Encounter (HOSPITAL_COMMUNITY): Payer: Self-pay | Admitting: Emergency Medicine

## 2013-11-15 DIAGNOSIS — I959 Hypotension, unspecified: Secondary | ICD-10-CM | POA: Diagnosis present

## 2013-11-15 DIAGNOSIS — Z79899 Other long term (current) drug therapy: Secondary | ICD-10-CM

## 2013-11-15 DIAGNOSIS — Z91148 Patient's other noncompliance with medication regimen for other reason: Secondary | ICD-10-CM

## 2013-11-15 DIAGNOSIS — E1110 Type 2 diabetes mellitus with ketoacidosis without coma: Secondary | ICD-10-CM

## 2013-11-15 DIAGNOSIS — Z9119 Patient's noncompliance with other medical treatment and regimen: Secondary | ICD-10-CM

## 2013-11-15 DIAGNOSIS — R748 Abnormal levels of other serum enzymes: Secondary | ICD-10-CM | POA: Diagnosis not present

## 2013-11-15 DIAGNOSIS — Z91040 Latex allergy status: Secondary | ICD-10-CM

## 2013-11-15 DIAGNOSIS — E875 Hyperkalemia: Secondary | ICD-10-CM

## 2013-11-15 DIAGNOSIS — R079 Chest pain, unspecified: Secondary | ICD-10-CM

## 2013-11-15 DIAGNOSIS — F329 Major depressive disorder, single episode, unspecified: Secondary | ICD-10-CM | POA: Diagnosis present

## 2013-11-15 DIAGNOSIS — N179 Acute kidney failure, unspecified: Secondary | ICD-10-CM | POA: Diagnosis present

## 2013-11-15 DIAGNOSIS — R4182 Altered mental status, unspecified: Secondary | ICD-10-CM

## 2013-11-15 DIAGNOSIS — Z794 Long term (current) use of insulin: Secondary | ICD-10-CM

## 2013-11-15 DIAGNOSIS — K219 Gastro-esophageal reflux disease without esophagitis: Secondary | ICD-10-CM

## 2013-11-15 DIAGNOSIS — F17200 Nicotine dependence, unspecified, uncomplicated: Secondary | ICD-10-CM | POA: Diagnosis present

## 2013-11-15 DIAGNOSIS — E1010 Type 1 diabetes mellitus with ketoacidosis without coma: Principal | ICD-10-CM | POA: Diagnosis present

## 2013-11-15 DIAGNOSIS — F3289 Other specified depressive episodes: Secondary | ICD-10-CM | POA: Diagnosis present

## 2013-11-15 DIAGNOSIS — I214 Non-ST elevation (NSTEMI) myocardial infarction: Secondary | ICD-10-CM

## 2013-11-15 DIAGNOSIS — R7989 Other specified abnormal findings of blood chemistry: Secondary | ICD-10-CM

## 2013-11-15 DIAGNOSIS — E876 Hypokalemia: Secondary | ICD-10-CM | POA: Diagnosis not present

## 2013-11-15 DIAGNOSIS — Z23 Encounter for immunization: Secondary | ICD-10-CM

## 2013-11-15 DIAGNOSIS — IMO0002 Reserved for concepts with insufficient information to code with codable children: Secondary | ICD-10-CM | POA: Diagnosis present

## 2013-11-15 DIAGNOSIS — N19 Unspecified kidney failure: Secondary | ICD-10-CM

## 2013-11-15 DIAGNOSIS — E1065 Type 1 diabetes mellitus with hyperglycemia: Secondary | ICD-10-CM | POA: Diagnosis present

## 2013-11-15 DIAGNOSIS — E119 Type 2 diabetes mellitus without complications: Secondary | ICD-10-CM

## 2013-11-15 DIAGNOSIS — R778 Other specified abnormalities of plasma proteins: Secondary | ICD-10-CM | POA: Diagnosis not present

## 2013-11-15 DIAGNOSIS — J45909 Unspecified asthma, uncomplicated: Secondary | ICD-10-CM | POA: Diagnosis present

## 2013-11-15 DIAGNOSIS — I498 Other specified cardiac arrhythmias: Secondary | ICD-10-CM | POA: Diagnosis present

## 2013-11-15 DIAGNOSIS — E860 Dehydration: Secondary | ICD-10-CM | POA: Diagnosis present

## 2013-11-15 DIAGNOSIS — Z91199 Patient's noncompliance with other medical treatment and regimen due to unspecified reason: Secondary | ICD-10-CM

## 2013-11-15 DIAGNOSIS — N390 Urinary tract infection, site not specified: Secondary | ICD-10-CM | POA: Diagnosis not present

## 2013-11-15 DIAGNOSIS — R0602 Shortness of breath: Secondary | ICD-10-CM

## 2013-11-15 DIAGNOSIS — Z9114 Patient's other noncompliance with medication regimen: Secondary | ICD-10-CM

## 2013-11-15 DIAGNOSIS — Z7982 Long term (current) use of aspirin: Secondary | ICD-10-CM

## 2013-11-15 DIAGNOSIS — G9341 Metabolic encephalopathy: Secondary | ICD-10-CM | POA: Diagnosis present

## 2013-11-15 HISTORY — DX: Headache: R51

## 2013-11-15 LAB — CBC WITH DIFFERENTIAL/PLATELET
BASOS PCT: 0 % (ref 0–1)
Basophils Absolute: 0 10*3/uL (ref 0.0–0.1)
EOS ABS: 0 10*3/uL (ref 0.0–0.7)
EOS PCT: 0 % (ref 0–5)
HCT: 45.3 % (ref 36.0–46.0)
HEMOGLOBIN: 14.7 g/dL (ref 12.0–15.0)
Lymphocytes Relative: 17 % (ref 12–46)
Lymphs Abs: 3.5 10*3/uL (ref 0.7–4.0)
MCH: 32.7 pg (ref 26.0–34.0)
MCHC: 32.5 g/dL (ref 30.0–36.0)
MCV: 100.9 fL — AB (ref 78.0–100.0)
MONO ABS: 3.1 10*3/uL — AB (ref 0.1–1.0)
Monocytes Relative: 15 % — ABNORMAL HIGH (ref 3–12)
NEUTROS ABS: 13.9 10*3/uL — AB (ref 1.7–7.7)
Neutrophils Relative %: 68 % (ref 43–77)
Platelets: 421 10*3/uL — ABNORMAL HIGH (ref 150–400)
RBC: 4.49 MIL/uL (ref 3.87–5.11)
RDW: 14.2 % (ref 11.5–15.5)
WBC: 20.5 10*3/uL — ABNORMAL HIGH (ref 4.0–10.5)

## 2013-11-15 LAB — I-STAT CHEM 8, ED
BUN: 87 mg/dL — AB (ref 6–23)
CHLORIDE: 90 meq/L — AB (ref 96–112)
CREATININE: 2.9 mg/dL — AB (ref 0.50–1.10)
Calcium, Ion: 0.88 mmol/L — ABNORMAL LOW (ref 1.12–1.23)
Glucose, Bld: >700 mg/dL (ref 70–99)
HCT: 48 % — ABNORMAL HIGH (ref 36.0–46.0)
Hemoglobin: 16.3 g/dL — ABNORMAL HIGH (ref 12.0–15.0)
Potassium: 7.7 mEq/L (ref 3.7–5.3)
Sodium: 122 mEq/L — ABNORMAL LOW (ref 137–147)
TCO2: 7 mmol/L (ref 0–100)

## 2013-11-15 LAB — POCT I-STAT 3, ART BLOOD GAS (G3+)
ACID-BASE DEFICIT: 7 mmol/L — AB (ref 0.0–2.0)
Bicarbonate: 18.2 mEq/L — ABNORMAL LOW (ref 20.0–24.0)
O2 SAT: 91 %
TCO2: 19 mmol/L (ref 0–100)
pCO2 arterial: 35.7 mmHg (ref 35.0–45.0)
pH, Arterial: 7.32 — ABNORMAL LOW (ref 7.350–7.450)
pO2, Arterial: 68 mmHg — ABNORMAL LOW (ref 80.0–100.0)

## 2013-11-15 LAB — BASIC METABOLIC PANEL
BUN: 57 mg/dL — ABNORMAL HIGH (ref 6–23)
BUN: 52 mg/dL — ABNORMAL HIGH (ref 6–23)
BUN: 51 mg/dL — ABNORMAL HIGH (ref 6–23)
CALCIUM: 8.1 mg/dL — AB (ref 8.4–10.5)
CHLORIDE: 71 meq/L — AB (ref 96–112)
CO2: <7 mEq/L — CL (ref 19–32)
CO2: 8 meq/L — AB (ref 19–32)
CO2: 11 mEq/L — ABNORMAL LOW (ref 19–32)
Calcium: 8.5 mg/dL (ref 8.4–10.5)
Calcium: 8 mg/dL — ABNORMAL LOW (ref 8.4–10.5)
Chloride: 91 mEq/L — ABNORMAL LOW (ref 96–112)
Chloride: 104 mEq/L (ref 96–112)
Creatinine, Ser: 2.77 mg/dL — ABNORMAL HIGH (ref 0.50–1.10)
Creatinine, Ser: 1.89 mg/dL — ABNORMAL HIGH (ref 0.50–1.10)
Creatinine, Ser: 1.52 mg/dL — ABNORMAL HIGH (ref 0.50–1.10)
GFR calc Af Amer: 52 mL/min — ABNORMAL LOW (ref >90)
GFR calc non Af Amer: 22 mL/min — ABNORMAL LOW (ref >90)
GFR calc non Af Amer: 34 mL/min — ABNORMAL LOW (ref >90)
GFR calc non Af Amer: 45 mL/min — ABNORMAL LOW (ref >90)
GFR, EST AFRICAN AMERICAN: 25 mL/min — AB (ref >90)
GFR, EST AFRICAN AMERICAN: 40 mL/min — AB (ref >90)
Glucose, Bld: 1076 mg/dL (ref 70–99)
Glucose, Bld: 546 mg/dL — ABNORMAL HIGH (ref 70–99)
Glucose, Bld: 351 mg/dL — ABNORMAL HIGH (ref 70–99)
POTASSIUM: 4.3 meq/L (ref 3.7–5.3)
Potassium: 5.8 mEq/L — ABNORMAL HIGH (ref 3.7–5.3)
Potassium: 4.3 mEq/L (ref 3.7–5.3)
SODIUM: 143 meq/L (ref 137–147)
Sodium: 133 mEq/L — ABNORMAL LOW (ref 137–147)
Sodium: 140 mEq/L (ref 137–147)

## 2013-11-15 LAB — TROPONIN I: Troponin I: <0.3 ng/mL (ref <0.30)

## 2013-11-15 LAB — URINE MICROSCOPIC-ADD ON

## 2013-11-15 LAB — URINALYSIS, ROUTINE W REFLEX MICROSCOPIC
Leukocytes, UA: NEGATIVE
NITRITE: NEGATIVE
PH: 5 (ref 5.0–8.0)
Protein, ur: 100 mg/dL — AB
SPECIFIC GRAVITY, URINE: 1.024 (ref 1.005–1.030)
Urobilinogen, UA: 0.2 mg/dL (ref 0.0–1.0)

## 2013-11-15 LAB — GLUCOSE, CAPILLARY
GLUCOSE-CAPILLARY: 403 mg/dL — AB (ref 70–99)
GLUCOSE-CAPILLARY: 353 mg/dL — AB (ref 70–99)
GLUCOSE-CAPILLARY: 263 mg/dL — AB (ref 70–99)
Glucose-Capillary: 515 mg/dL — ABNORMAL HIGH (ref 70–99)
Glucose-Capillary: >600 mg/dL (ref 70–99)

## 2013-11-15 LAB — I-STAT VENOUS BLOOD GAS, ED
Acid-base deficit: 26 mmol/L — ABNORMAL HIGH (ref 0.0–2.0)
Bicarbonate: 4.6 mEq/L — ABNORMAL LOW (ref 20.0–24.0)
O2 Saturation: 92 %
PCO2 VEN: 20.9 mmHg — AB (ref 45.0–50.0)
TCO2: 5 mmol/L (ref 0–100)
pH, Ven: 6.953 — CL (ref 7.250–7.300)
pO2, Ven: 100 mmHg — ABNORMAL HIGH (ref 30.0–45.0)

## 2013-11-15 LAB — RAPID URINE DRUG SCREEN, HOSP PERFORMED
Amphetamines: NOT DETECTED
BARBITURATES: NOT DETECTED
Benzodiazepines: NOT DETECTED
Cocaine: NOT DETECTED
Opiates: NOT DETECTED
Tetrahydrocannabinol: NOT DETECTED

## 2013-11-15 LAB — PROCALCITONIN: Procalcitonin: 0.65 ng/mL

## 2013-11-15 LAB — MRSA PCR SCREENING: MRSA by PCR: NEGATIVE

## 2013-11-15 LAB — CBG MONITORING, ED: Glucose-Capillary: >600 mg/dL (ref 70–99)

## 2013-11-15 LAB — KETONES, QUALITATIVE

## 2013-11-15 MED ORDER — DEXTROSE-NACL 5-0.45 % IV SOLN
INTRAVENOUS | Status: DC
  Administered 2013-11-15: 1000 mL via INTRAVENOUS
  Administered 2013-11-16: 20:00:00 via INTRAVENOUS

## 2013-11-15 MED ORDER — ACETAMINOPHEN 160 MG/5ML PO SOLN
650.0000 mg | Freq: Four times a day (QID) | ORAL | Status: DC | PRN
  Administered 2013-11-16 – 2013-11-20 (×3): 650 mg via ORAL
  Filled 2013-11-15 (×3): qty 20.3

## 2013-11-15 MED ORDER — DEXTROSE-NACL 5-0.45 % IV SOLN: INTRAVENOUS | Status: DC

## 2013-11-15 MED ORDER — SODIUM CHLORIDE 0.9 % IV SOLN
INTRAVENOUS | Status: DC
  Administered 2013-11-15: 18.2 [IU]/h via INTRAVENOUS
  Administered 2013-11-15: 5.4 [IU]/h via INTRAVENOUS
  Administered 2013-11-15: 13.7 [IU]/h via INTRAVENOUS
  Filled 2013-11-15 (×3): qty 1

## 2013-11-15 MED ORDER — DEXTROSE 50 % IV SOLN: 25.0000 mL | INTRAVENOUS | Status: DC | PRN

## 2013-11-15 MED ORDER — SODIUM BICARBONATE 8.4 % IV SOLN
50.0000 meq | Freq: Once | INTRAVENOUS | Status: AC
  Administered 2013-11-15: 50 meq via INTRAVENOUS
  Filled 2013-11-15: qty 50

## 2013-11-15 MED ORDER — INFLUENZA VAC SPLIT QUAD 0.5 ML IM SUSP
0.5000 mL | INTRAMUSCULAR | Status: AC
  Administered 2013-11-16: 0.5 mL via INTRAMUSCULAR
  Filled 2013-11-15: qty 0.5

## 2013-11-15 MED ORDER — HEPARIN SODIUM (PORCINE) 5000 UNIT/ML IJ SOLN
5000.0000 [IU] | Freq: Three times a day (TID) | INTRAMUSCULAR | Status: DC
  Administered 2013-11-15 – 2013-11-18 (×9): 5000 [IU] via SUBCUTANEOUS
  Filled 2013-11-15 (×12): qty 1

## 2013-11-15 MED ORDER — SODIUM BICARBONATE 8.4 % IV SOLN
50.0000 meq | Freq: Once | INTRAVENOUS | Status: DC
  Filled 2013-11-15: qty 50

## 2013-11-15 MED ORDER — INSULIN REGULAR HUMAN 100 UNIT/ML IJ SOLN
INTRAMUSCULAR | Status: DC
  Administered 2013-11-16: 2.3 [IU]/h via INTRAVENOUS
  Filled 2013-11-15: qty 1

## 2013-11-15 MED ORDER — SODIUM CHLORIDE 0.9 % IV SOLN
1000.0000 mL | INTRAVENOUS | Status: DC
  Administered 2013-11-15: 1000 mL via INTRAVENOUS

## 2013-11-15 MED ORDER — SODIUM CHLORIDE 0.9 % IV SOLN
1000.0000 mL | Freq: Once | INTRAVENOUS | Status: AC
  Administered 2013-11-15: 1000 mL via INTRAVENOUS

## 2013-11-15 MED ORDER — SODIUM CHLORIDE 0.9 % IV SOLN
INTRAVENOUS | Status: DC
  Administered 2013-11-15: 125 mL/h via INTRAVENOUS

## 2013-11-15 MED ORDER — POTASSIUM CHLORIDE 10 MEQ/100ML IV SOLN
10.0000 meq | INTRAVENOUS | Status: AC
  Administered 2013-11-15 (×2): 10 meq via INTRAVENOUS
  Filled 2013-11-15: qty 100

## 2013-11-15 NOTE — ED Notes (Signed)
CCM at bedside. Pt remains lethargic. Responding to painful stimuli. Vital signs stable.

## 2013-11-15 NOTE — Consult Note (Deleted)
Name: Kristine ElliotMargie E Allison MRN: [id number] DOB: December 30, 1980    ADMISSION DATE:  11/15/2013  REFERRING MD :  Dr. Karma GanjaLinker PRIMARY SERVICE: PCCM   CHIEF COMPLAINT:  DKA  BRIEF PATIENT DESCRIPTION: 33 y/o F, DM I,  admitted on 2/24 with lethargy / AMS & weakness.  Found to have DKA.    SIGNIFICANT EVENTS / STUDIES:  2/24 - Admit with DKA.  Per family, not taking insulin as prescribed.  Urine pregnancy>>>  LINES / TUBES: PIV's   CULTURES: UA 2/24 >> NEG UC 2/24 >> N/I   ANTIBIOTICS:   HISTORY OF PRESENT ILLNESS:  33 y/o F with PMH of asthma & DM-I who [facility] via EMS with report of incerasing lethargy, generalized weakness and high blood sugars.  Reportedly, patient has not been taking insulin as prescribed.  Has had few episodes of nausea, vomiting on 2/24.    ER work up noted hyperglycemia, high anion gap acidosis, glucose greater than 700, sr cr 2.90 and potassium of 7.7 (?accuracy), venous blood gas of 6.95, pcO2 20.9, bicarb 4.6.  Patient was found to have fruity breath on presentation, hypotensive with blood pressure in the 70's and dehydrated in appearance.  IV insulin initiated in ER and 2L NS.  PCCM called for ICU admit.   PAST MEDICAL HISTORY :  Past Medical History  Diagnosis Date  . Asthma   . Diabetes mellitus without complication    History reviewed. No pertinent past surgical history. Prior to Admission medications   Medication Sig Start Date End Date Taking? Authorizing Provider  albuterol (PROAIR HFA) 108 (90 BASE) MCG/ACT inhaler Inhale 2 puffs into the lungs 2 (two) times daily as needed for wheezing or shortness of breath.    Historical Provider, MD  insulin NPH-regular Human (NOVOLIN 70/30) (70-30) 100 UNIT/ML injection 20 unit am,15 unit pm,20 unit night 10/18/13   Janit PaganKehinde Eniola, MD  metFORMIN (GLUCOPHAGE-XR) 500 MG 24 hr tablet Take 2 tablets (1,000 mg total) by mouth daily with breakfast. 09/08/13   Janit PaganKehinde Eniola, MD   Allergies    Allergen Reactions  . Latex Rash    FAMILY HISTORY:  No family history on file. SOCIAL HISTORY:  reports that she has been smoking Cigarettes.  She has been smoking about 0.40 packs per day. She has never used smokeless tobacco. She reports that she does not drink alcohol or use illicit drugs.  REVIEW OF SYSTEMS:  Unable to complete as patient is altered.    SUBJECTIVE:   VITAL SIGNS: Temp:  [97.6 F (36.4 C)] 97.6 F (36.4 C) (02/24 1341) Pulse Rate:  [124-129] 124 (02/24 1416) Resp:  [34] 34 (02/24 1416) BP: (74-96)/(33-53) 96/53 mmHg (02/24 1416) SpO2:  [99 %-100 %] 99 % (02/24 1416)  HEMODYNAMICS:   VENTILATOR SETTINGS:   INTAKE / OUTPUT: Intake/Output     02/23 0701 - 02/24 0700 02/24 0701 - 02/25 0700   I.V.  1000   Total Intake   1000   Net   +1000          PHYSICAL EXAMINATION: General:  Well developed adult female in NAD Neuro:  Lethargic, responds minimally to verbal, attempts to answer questions HEENT:  Mm pink/very dry, cracked Cardiovascular:  s1s2 rrr, no m/r/g Lungs:  resp's even, non-labored, lungs bilaterally clear Abdomen:  Round/soft, bsx4 active Musculoskeletal:  No acute deformities  Skin:  Warm/dry, no edema  LABS:  CBC  Recent Labs Lab 11/15/13 1405  HGB 16.3*  HCT  48.0*   Coag's No results found for this basename: APTT, INR,  in the last 168 hours BMET  Recent Labs Lab 11/15/13 1405  NA 122*  K 7.7*  CL 90*  BUN 87*  CREATININE 2.90*  GLUCOSE >700*   Electrolytes No results found for this basename: CALCIUM, MG, PHOS,  in the last 168 hours Sepsis Markers No results found for this basename: LATICACIDVEN, PROCALCITON, O2SATVEN,  in the last 168 hours ABG No results found for this basename: PHART, PCO2ART, PO2ART,  in the last 168 hours Liver Enzymes No results found for this basename: AST, ALT, ALKPHOS, BILITOT, ALBUMIN,  in the last 168 hours Cardiac Enzymes No results found for this basename: TROPONINI, PROBNP,   in the last 168 hours Glucose  Recent Labs Lab 11/15/13 1404  GLUCAP >600*    Imaging Dg Chest Portable 1 View  11/15/2013   CLINICAL DATA:  Shortness of Breath  EXAM: PORTABLE CHEST - 1 VIEW  COMPARISON:  07/26/2013  FINDINGS: Cardiomediastinal silhouette is stable. No acute infiltrate or pleural effusion. No pulmonary edema. Bony thorax is unremarkable.  IMPRESSION: No active disease.   Electronically Signed   By: Natasha Mead M.D.   On: 11/15/2013 13:54   ASSESSMENT / PLAN:  ENDOCRINE A:   DKA - patient stopped insulin therapy at home P:   -admit to ICU -DKA protocol  -NS volume resuscitation x2L, then 125 ml/hr -hold basal insulin for now -d50 for hypogylcemia -r/o pregnancy, assess UDS  PULMONARY A: At Risk ATX Tobacco Abuse P:   -monitor respiratory status closely -aspiration precautions -oxygen to support sats >93%  CARDIOVASCULAR A:  Tachycardia - in setting of DKA P:  -tele monitoring  -assess troponin, EKG  RENAL A:   Metabolic Acidosis - in setting of DKA with appropriate resp compensation Elevated Anion Gap Hyperkalemia P:   -Q2 BMP to monitor for AG closure -NS as above / volume resuscitation  -place foley to monitor I/O's  GASTROINTESTINAL A:   NPO P:   -NPO until mental status clears  HEMATOLOGIC A:   Hemoconcentration  P:  -monitor CBC -Heparin for DVT prophylaxis   INFECTIOUS A:   No over hx of infectious process -  ? Hx of n/v on day of admit but likely related to DKA.  P:   -culture as above -hold abx  NEUROLOGIC A:   Acute Metabolic Encephalopathy  P:   -supportive care -monitor mental status  Canary Brim, NP-C [facility] Pgr: [phone] or [phone]    I have personally obtained a history, examined the patient, evaluated laboratory and imaging results, formulated the assessment and plan and placed orders.  CRITICAL CARE: The patient is critically ill with multiple organ systems failure and  requires high complexity decision making for assessment and support, frequent evaluation and titration of therapies, application of advanced monitoring technologies and extensive interpretation of multiple databases. Critical Care Time devoted to patient care services described in this note is __ minutes.   Billy Fischer, MD ; [facility] [phone].  After 5:30 PM or weekends, call [phone]  11/15/2013, 3:03 PM

## 2013-11-15 NOTE — ED Notes (Signed)
Chem 8 results given to Dr. Karma GanjaLinker

## 2013-11-15 NOTE — ED Notes (Signed)
Pt lethargic and breathing rapidly upon arrival. Pt states she has not taken Novolog 70/30 x2 days. Has not been check blood sugars. Pt responding to painful stimuli, unable to answer questions. Denies pain.

## 2013-11-15 NOTE — ED Notes (Signed)
EKG done and shown to Dr. Karma GanjaLinker

## 2013-11-15 NOTE — ED Notes (Signed)
CBG CRITICAL HIGH at present.

## 2013-11-15 NOTE — ED Notes (Signed)
CBG CRITICAL HIGH 

## 2013-11-15 NOTE — ED Notes (Signed)
Floor unable to take report at the time. To call back.

## 2013-11-15 NOTE — ED Notes (Signed)
Pt presents to department via [facility] for evaluation of hyperglycemia. History of DKA. Hasn't been taking insulin as directed. Critical HIGH CBG per EMS. Respirations labored upon arrival. Pt also noted to be lethargic and c/o generalized weakness.

## 2013-11-15 NOTE — ED Provider Notes (Signed)
CSN: [id number]     Arrival date & time 11/15/13  1324 History   First MD Initiated Contact with Patient 11/15/13 1328     Chief Complaint  Patient presents with  . Hyperglycemia     (Consider location/radiation/quality/duration/timing/severity/associated sxs/prior Treatment) HPI A LEVEL 5 CAVEAT PERTAINS DUE TO ALTERED MENTAL STATUS AND URGENT NEED FOR INTERVENTION Pt presents via EMS with elevated blood glucose, hx of not taking insulin for several days.  Hx of DKA. Pt appears dehydrated, kussmaul respirations.   Past Medical History  Diagnosis Date  . Asthma   . Diabetes mellitus without complication    History reviewed. No pertinent past surgical history. No family history on file. History  Substance Use Topics  . Smoking status: Current Every Day Smoker -- 0.40 packs/day    Types: Cigarettes  . Smokeless tobacco: Never Used     Comment: only smoking electronic cigs  . Alcohol Use: No   OB History   Grav Para Term Preterm Abortions TAB SAB Ect Mult Living                 Review of Systems UNABLE TO OBTAIN ROS DUE TO LEVEL 5 CAVEAT    Allergies  Latex  Home Medications   Current Outpatient Rx  Name  Route  Sig  Dispense  Refill  . albuterol (PROAIR HFA) 108 (90 BASE) MCG/ACT inhaler   Inhalation   Inhale 2 puffs into the lungs 2 (two) times daily as needed for wheezing or shortness of breath.         . insulin NPH-regular Human (NOVOLIN 70/30) (70-30) 100 UNIT/ML injection      20 unit am,15 unit pm,20 unit night   10 mL   5   . metFORMIN (GLUCOPHAGE-XR) 500 MG 24 hr tablet   Oral   Take 2 tablets (1,000 mg total) by mouth daily with breakfast.   60 tablet   4    BP 96/53  Pulse 124  Temp(Src) 97.6 F (36.4 C) (Oral)  Resp 34  SpO2 99% Vitals reviewed Physical Exam Physical Examination: General appearance - decreased responsiveness, awakens to voice, ill appearing, mild distress Mental status - responsive to voice, not oriented Eyes -  pupils equal and reactive, extraocular eye movements intact Mouth -mucous membranes dry, OP clear Chest - clear to auscultation, no wheezes, rales or rhonchi, symmetric air entry, kussmaul respirations  Heart - rapid rate, regular rhythm, normal S1, S2, no murmurs, rubs, clicks or gallops Abdomen - soft, diffusely tender to palpation, nabs, nondistended, no masses or organomegaly Neurological - alert, oriented, normal speech, no focal findings or movement disorder noted Extremities - peripheral pulses normal, no pedal edema, no clubbing or cyanosis Skin - normal coloration, no rash, poor skin turgor  ED Course  Procedures (including critical care time) Labs Review Labs Reviewed  CBG MONITORING, ED - Abnormal; Notable for the following:    Glucose-Capillary >600 (*)    All other components within normal limits  I-STAT VENOUS BLOOD GAS, ED - Abnormal; Notable for the following:    pH, Ven 6.953 (*)    pCO2, Ven 20.9 (*)    pO2, Ven 100.0 (*)    Bicarbonate 4.6 (*)    Acid-base deficit 26.0 (*)    All other components within normal limits  I-STAT CHEM 8, ED - Abnormal; Notable for the following:    Sodium 122 (*)    Potassium 7.7 (*)    Chloride 90 (*)    BUN 87 (*)  Creatinine, Ser 2.90 (*)    Glucose, Bld >700 (*)    Calcium, Ion 0.88 (*)    Hemoglobin 16.3 (*)    HCT 48.0 (*)    All other components within normal limits  BASIC METABOLIC PANEL  CBC WITH DIFFERENTIAL  URINALYSIS, ROUTINE W REFLEX MICROSCOPIC  KETONES, QUALITATIVE  POC URINE PREG, ED   Imaging Review Dg Chest Portable 1 View  11/15/2013   CLINICAL DATA:  Shortness of Breath  EXAM: PORTABLE CHEST - 1 VIEW  COMPARISON:  07/26/2013  FINDINGS: Cardiomediastinal silhouette is stable. No acute infiltrate or pleural effusion. No pulmonary edema. Bony thorax is unremarkable.  IMPRESSION: No active disease.   Electronically Signed   By: Natasha Mead M.D.   On: 11/15/2013 13:54    EKG Interpretation     Date/Time:  Tuesday November 15 2013 13:37:29 EST Ventricular Rate:  129 PR Interval:  89 QRS Duration: 85 QT Interval:  334 QTC Calculation: 489 R Axis:   83 Text Interpretation:  Sinus or ectopic atrial tachycardia ST elev, probable normal early repol pattern Borderline prolonged QT interval artifact of baseline No significant change since last tracing Confirmed by [facility]  MD, Elliette Seabolt [phone]) on 11/15/2013 3:07:12 PM           CRITICAL CARE Performed by: Ethelda Chick Total critical care time: 45 Critical care time was exclusive of separately billable procedures and treating other patients. Critical care was necessary to treat or prevent imminent or life-threatening deterioration. Critical care was time spent personally by me on the following activities: development of treatment plan with patient and/or surrogate as well as nursing, discussions with consultants, evaluation of patient's response to treatment, examination of patient, obtaining history from patient or surrogate, ordering and performing treatments and interventions, ordering and review of laboratory studies, ordering and review of radiographic studies, pulse oximetry and re-evaluation of patient's condition. MDM   Final diagnoses:  DKA (diabetic ketoacidoses)  Hyperkalemia  Shortness of breath  Renal failure  Altered mental status   Pt presenting with decreased mental status, BS > 600.  Pt in DKA.  Started on IV fluids, insulin drip.  Supplemental O2.  Per chart review patient with hx of DKA.  EKG shows sinus tachy, labs show renal failure, bicarb of 4, hyperkalemia.  vbg shows PH < 7, AG 34.  Pt to be admitted to ICU for further management.      Ethelda Chick, MD 11/15/13 [phone]

## 2013-11-16 LAB — BASIC METABOLIC PANEL WITH GFR
BUN: 43 mg/dL — ABNORMAL HIGH (ref 6–23)
BUN: 40 mg/dL — ABNORMAL HIGH (ref 6–23)
BUN: 35 mg/dL — ABNORMAL HIGH (ref 6–23)
BUN: 35 mg/dL — ABNORMAL HIGH (ref 6–23)
BUN: 29 mg/dL — ABNORMAL HIGH (ref 6–23)
CO2: 18 meq/L — ABNORMAL LOW (ref 19–32)
CO2: 21 meq/L (ref 19–32)
CO2: 19 meq/L (ref 19–32)
CO2: 22 meq/L (ref 19–32)
CO2: 21 meq/L (ref 19–32)
Calcium: 8.1 mg/dL — ABNORMAL LOW (ref 8.4–10.5)
Calcium: 8.3 mg/dL — ABNORMAL LOW (ref 8.4–10.5)
Calcium: 8.4 mg/dL (ref 8.4–10.5)
Calcium: 8.5 mg/dL (ref 8.4–10.5)
Calcium: 8.6 mg/dL (ref 8.4–10.5)
Chloride: 110 meq/L (ref 96–112)
Chloride: 108 meq/L (ref 96–112)
Chloride: 111 meq/L (ref 96–112)
Chloride: 110 meq/L (ref 96–112)
Chloride: 109 meq/L (ref 96–112)
Creatinine, Ser: 1.05 mg/dL (ref 0.50–1.10)
Creatinine, Ser: 0.98 mg/dL (ref 0.50–1.10)
Creatinine, Ser: 0.74 mg/dL (ref 0.50–1.10)
Creatinine, Ser: 0.81 mg/dL (ref 0.50–1.10)
Creatinine, Ser: 0.68 mg/dL (ref 0.50–1.10)
GFR calc Af Amer: 81 mL/min — ABNORMAL LOW
GFR calc Af Amer: 88 mL/min — ABNORMAL LOW
GFR calc Af Amer: >90 mL/min
GFR calc Af Amer: >90 mL/min
GFR calc Af Amer: >90 mL/min
GFR calc non Af Amer: 70 mL/min — ABNORMAL LOW
GFR calc non Af Amer: 76 mL/min — ABNORMAL LOW
GFR calc non Af Amer: >90 mL/min
GFR calc non Af Amer: >90 mL/min
GFR calc non Af Amer: >90 mL/min
Glucose, Bld: 183 mg/dL — ABNORMAL HIGH (ref 70–99)
Glucose, Bld: 140 mg/dL — ABNORMAL HIGH (ref 70–99)
Glucose, Bld: 120 mg/dL — ABNORMAL HIGH (ref 70–99)
Glucose, Bld: 123 mg/dL — ABNORMAL HIGH (ref 70–99)
Glucose, Bld: 168 mg/dL — ABNORMAL HIGH (ref 70–99)
Potassium: 4.5 meq/L (ref 3.7–5.3)
Potassium: 4.6 meq/L (ref 3.7–5.3)
Potassium: 5.1 meq/L (ref 3.7–5.3)
Potassium: 4.2 meq/L (ref 3.7–5.3)
Potassium: 3.9 meq/L (ref 3.7–5.3)
Sodium: 145 meq/L (ref 137–147)
Sodium: 147 meq/L (ref 137–147)
Sodium: 144 meq/L (ref 137–147)
Sodium: 148 meq/L — ABNORMAL HIGH (ref 137–147)
Sodium: 144 meq/L (ref 137–147)

## 2013-11-16 LAB — BASIC METABOLIC PANEL
BUN: 47 mg/dL — ABNORMAL HIGH (ref 6–23)
BUN: 31 mg/dL — AB (ref 6–23)
BUN: 24 mg/dL — ABNORMAL HIGH (ref 6–23)
BUN: 19 mg/dL (ref 6–23)
BUN: 18 mg/dL (ref 6–23)
CALCIUM: 7.8 mg/dL — AB (ref 8.4–10.5)
CHLORIDE: 107 meq/L (ref 96–112)
CHLORIDE: 110 meq/L (ref 96–112)
CHLORIDE: 109 meq/L (ref 96–112)
CHLORIDE: 106 meq/L (ref 96–112)
CO2: 18 mEq/L — ABNORMAL LOW (ref 19–32)
CO2: 22 meq/L (ref 19–32)
CO2: 24 mEq/L (ref 19–32)
CO2: 23 meq/L (ref 19–32)
CO2: 23 meq/L (ref 19–32)
CREATININE: 1.24 mg/dL — AB (ref 0.50–1.10)
Calcium: 8.5 mg/dL (ref 8.4–10.5)
Calcium: 8.6 mg/dL (ref 8.4–10.5)
Calcium: 8.6 mg/dL (ref 8.4–10.5)
Calcium: 8.5 mg/dL (ref 8.4–10.5)
Chloride: 105 mEq/L (ref 96–112)
Creatinine, Ser: 0.7 mg/dL (ref 0.50–1.10)
Creatinine, Ser: 0.62 mg/dL (ref 0.50–1.10)
Creatinine, Ser: 0.53 mg/dL (ref 0.50–1.10)
Creatinine, Ser: 0.61 mg/dL (ref 0.50–1.10)
GFR calc Af Amer: >90 mL/min (ref >90)
GFR calc Af Amer: >90 mL/min (ref >90)
GFR calc non Af Amer: >90 mL/min (ref >90)
GFR calc non Af Amer: >90 mL/min (ref >90)
GFR calc non Af Amer: >90 mL/min (ref >90)
GFR, EST AFRICAN AMERICAN: 66 mL/min — AB (ref >90)
GFR, EST NON AFRICAN AMERICAN: 57 mL/min — AB (ref >90)
GLUCOSE: 169 mg/dL — AB (ref 70–99)
Glucose, Bld: 232 mg/dL — ABNORMAL HIGH (ref 70–99)
Glucose, Bld: 138 mg/dL — ABNORMAL HIGH (ref 70–99)
Glucose, Bld: 199 mg/dL — ABNORMAL HIGH (ref 70–99)
Glucose, Bld: 158 mg/dL — ABNORMAL HIGH (ref 70–99)
POTASSIUM: 3.8 meq/L (ref 3.7–5.3)
Potassium: 4.6 mEq/L (ref 3.7–5.3)
Potassium: 4.7 mEq/L (ref 3.7–5.3)
Potassium: 3.7 mEq/L (ref 3.7–5.3)
Potassium: 3.3 mEq/L — ABNORMAL LOW (ref 3.7–5.3)
SODIUM: 144 meq/L (ref 137–147)
SODIUM: 142 meq/L (ref 137–147)
SODIUM: 140 meq/L (ref 137–147)
Sodium: 144 mEq/L (ref 137–147)
Sodium: 147 mEq/L (ref 137–147)

## 2013-11-16 LAB — GLUCOSE, CAPILLARY
GLUCOSE-CAPILLARY: 114 mg/dL — AB (ref 70–99)
GLUCOSE-CAPILLARY: 114 mg/dL — AB (ref 70–99)
GLUCOSE-CAPILLARY: 117 mg/dL — AB (ref 70–99)
GLUCOSE-CAPILLARY: 133 mg/dL — AB (ref 70–99)
GLUCOSE-CAPILLARY: 141 mg/dL — AB (ref 70–99)
GLUCOSE-CAPILLARY: 148 mg/dL — AB (ref 70–99)
GLUCOSE-CAPILLARY: 150 mg/dL — AB (ref 70–99)
GLUCOSE-CAPILLARY: 153 mg/dL — AB (ref 70–99)
GLUCOSE-CAPILLARY: 159 mg/dL — AB (ref 70–99)
GLUCOSE-CAPILLARY: 213 mg/dL — AB (ref 70–99)
Glucose-Capillary: 134 mg/dL — ABNORMAL HIGH (ref 70–99)
Glucose-Capillary: 147 mg/dL — ABNORMAL HIGH (ref 70–99)
Glucose-Capillary: 173 mg/dL — ABNORMAL HIGH (ref 70–99)
Glucose-Capillary: 231 mg/dL — ABNORMAL HIGH (ref 70–99)
Glucose-Capillary: 145 mg/dL — ABNORMAL HIGH (ref 70–99)
Glucose-Capillary: 136 mg/dL — ABNORMAL HIGH (ref 70–99)
Glucose-Capillary: 146 mg/dL — ABNORMAL HIGH (ref 70–99)
Glucose-Capillary: 146 mg/dL — ABNORMAL HIGH (ref 70–99)
Glucose-Capillary: 152 mg/dL — ABNORMAL HIGH (ref 70–99)
Glucose-Capillary: 154 mg/dL — ABNORMAL HIGH (ref 70–99)
Glucose-Capillary: 161 mg/dL — ABNORMAL HIGH (ref 70–99)
Glucose-Capillary: 220 mg/dL — ABNORMAL HIGH (ref 70–99)
Glucose-Capillary: 233 mg/dL — ABNORMAL HIGH (ref 70–99)
Glucose-Capillary: 254 mg/dL — ABNORMAL HIGH (ref 70–99)

## 2013-11-16 LAB — POCT I-STAT, CHEM 8
BUN: 87 mg/dL — ABNORMAL HIGH (ref 6–23)
CHLORIDE: 90 meq/L — AB (ref 96–112)
Calcium, Ion: 0.88 mmol/L — ABNORMAL LOW (ref 1.12–1.23)
Creatinine, Ser: 2.9 mg/dL — ABNORMAL HIGH (ref 0.50–1.10)
Glucose, Bld: >700 mg/dL (ref 70–99)
HCT: 48 % — ABNORMAL HIGH (ref 36.0–46.0)
HEMOGLOBIN: 16.3 g/dL — AB (ref 12.0–15.0)
POTASSIUM: 7.7 meq/L — AB (ref 3.7–5.3)
SODIUM: 122 meq/L — AB (ref 137–147)
TCO2: 7 mmol/L (ref 0–100)

## 2013-11-16 LAB — CBC
HEMATOCRIT: 35.2 % — AB (ref 36.0–46.0)
Hemoglobin: 12.6 g/dL (ref 12.0–15.0)
MCH: 32.3 pg (ref 26.0–34.0)
MCHC: 35.8 g/dL (ref 30.0–36.0)
MCV: 90.3 fL (ref 78.0–100.0)
PLATELETS: 206 10*3/uL (ref 150–400)
RBC: 3.9 MIL/uL (ref 3.87–5.11)
RDW: 14.2 % (ref 11.5–15.5)
WBC: 7.5 10*3/uL (ref 4.0–10.5)

## 2013-11-16 LAB — PROCALCITONIN: Procalcitonin: 0.72 ng/mL

## 2013-11-16 LAB — TROPONIN I

## 2013-11-16 MED ORDER — POTASSIUM CHLORIDE CRYS ER 20 MEQ PO TBCR
40.0000 meq | EXTENDED_RELEASE_TABLET | Freq: Four times a day (QID) | ORAL | Status: AC
  Administered 2013-11-16 – 2013-11-17 (×2): 40 meq via ORAL
  Filled 2013-11-16 (×2): qty 2

## 2013-11-16 MED ORDER — INSULIN ASPART 100 UNIT/ML ~~LOC~~ SOLN
2.0000 [IU] | SUBCUTANEOUS | Status: DC
  Administered 2013-11-17: 2 [IU] via SUBCUTANEOUS
  Administered 2013-11-17: 4 [IU] via SUBCUTANEOUS

## 2013-11-16 MED ORDER — WHITE PETROLATUM GEL
Status: AC
  Administered 2013-11-16: 21:00:00
  Filled 2013-11-16: qty 5

## 2013-11-16 MED ORDER — SALINE SPRAY 0.65 % NA SOLN
1.0000 | NASAL | Status: DC | PRN
Start: 2013-11-16 — End: 2013-11-20
  Filled 2013-11-16: qty 44

## 2013-11-16 MED ORDER — INSULIN GLARGINE 100 UNIT/ML ~~LOC~~ SOLN
30.0000 [IU] | SUBCUTANEOUS | Status: DC
  Filled 2013-11-16 (×2): qty 0.3

## 2013-11-16 MED ORDER — SODIUM CHLORIDE 0.9 % IV SOLN
INTRAVENOUS | Status: DC
  Administered 2013-11-19: 01:00:00 via INTRAVENOUS

## 2013-11-16 NOTE — Progress Notes (Signed)
Inpatient Diabetes Program Recommendations  AACE/ADA: New Consensus Statement on Inpatient Glycemic Control (2013)  Target Ranges:  Prepandial:   less than 140 mg/dL      Peak postprandial:   less than 180 mg/dL (1-2 hours)      Critically ill patients:  140 - 180 mg/dL   Reason for Visit: Patient admitted with DKA.  She was in the hospital in November of 2014 and has been followed at [facility].    Diabetes history: Type 1 vs. Type 2 Outpatient Diabetes medications: Novolin 70/30- 20 units with breakfast, 15 units with supper, and 20 units q HS Current orders for Inpatient glycemic control: Currently on insulin drip awaiting anion gap to close.    In the past cost and resources have been an issue.  Will speak to patient regarding most recent events.  It appears that A1C has never been controlled and in December was greater than 14%.    Beryl MeagerJenny Shahrzad Koble, RN, BC-ADM Inpatient Diabetes Coordinator Pager [phone]

## 2013-11-16 NOTE — H&P (Addendum)
Name: Kristine Allison MRN: [id number] DOB: [birth year]    ADMISSION DATE:  11/15/2013  REFERRING MD :  Dr. Karma Ganja PRIMARY SERVICE: PCCM   CHIEF COMPLAINT:  DKA  BRIEF PATIENT DESCRIPTION: 33 y/o F, DM I,  admitted on 2/24 with lethargy / AMS & weakness.  Found to have DKA.    SIGNIFICANT EVENTS / STUDIES:  2/24 - Admit with DKA.  Per family, not taking insulin as prescribed.  Urine pregnancy>>>  LINES / TUBES: PIV's   CULTURES: UA 2/24 >> NEG UC 2/24 >> N/I   ANTIBIOTICS:   HISTORY OF PRESENT ILLNESS:  33 y/o F with PMH of asthma & DM-I who presented to [facility] ER on 2/24 via EMS with report of incerasing lethargy, generalized weakness and high blood sugars.  Reportedly, patient has not been taking insulin as prescribed.  Has had few episodes of nausea, vomiting on 2/24.    ER work up noted hyperglycemia, high anion gap acidosis, glucose greater than 700, sr cr 2.90 and potassium of 7.7 (?accuracy), venous blood gas of 6.95, pcO2 20.9, bicarb 4.6.  Patient was found to have fruity breath on presentation, hypotensive with blood pressure in the 70's and dehydrated in appearance.  IV insulin initiated in ER and 2L NS.  PCCM called for ICU admit.   PAST MEDICAL HISTORY :  Past Medical History  Diagnosis Date  . Asthma   . Diabetes mellitus without complication   . [id number](478.2)    Past Surgical History  Procedure Laterality Date  . No past surgeries     Prior to Admission medications   Medication Sig Start Date End Date Taking? Authorizing Provider  albuterol (PROAIR HFA) 108 (90 BASE) MCG/ACT inhaler Inhale 2 puffs into the lungs 2 (two) times daily as needed for wheezing or shortness of breath.    Historical Provider, MD  insulin NPH-regular Human (NOVOLIN 70/30) (70-30) 100 UNIT/ML injection 20 unit am,15 unit pm,20 unit night 10/18/13   Janit Pagan, MD  metFORMIN (GLUCOPHAGE-XR) 500 MG 24 hr tablet Take 2 tablets (1,000 mg total) by mouth daily with breakfast.  09/08/13   Janit Pagan, MD   Allergies  Allergen Reactions  . Latex Rash    FAMILY HISTORY:  History reviewed. No pertinent family history. SOCIAL HISTORY:  reports that she has been smoking Cigarettes.  She has a 8 pack-year smoking history. She has never used smokeless tobacco. She reports that she does not drink alcohol or use illicit drugs.  REVIEW OF SYSTEMS:  Unable to complete as patient is altered.    SUBJECTIVE:   VITAL SIGNS: Temp:  [98.1 F (36.7 C)-100.7 F (38.2 C)] 98.1 F (36.7 C) (02/25 1618) Pulse Rate:  [79-127] 86 (02/25 1900) Resp:  [12-27] 20 (02/25 1900) BP: (95-115)/(41-65) 101/63 mmHg (02/25 1900) SpO2:  [95 %-100 %] 97 % (02/25 1900) Weight:  [50.3 kg (110 lb 14.3 oz)] 50.3 kg (110 lb 14.3 oz) (02/25 1220)  HEMODYNAMICS:   VENTILATOR SETTINGS:   INTAKE / OUTPUT: Intake/Output     02/25 0701 - 02/26 0700   I.V. (mL/kg) 1100 (21.9)   IV Piggyback    Total Intake(mL/kg) 1100 (21.9)   Urine (mL/kg/hr) 1100 (1.7)   Total Output 1100   Net 0         PHYSICAL EXAMINATION: General:  Well developed adult female in NAD Neuro:  Lethargic, responds minimally to verbal, attempts to answer questions HEENT:  Mm pink/very dry, cracked Cardiovascular:  s1s2 rrr, no m/r/g Lungs:  resp's even, non-labored, lungs bilaterally clear Abdomen:  Round/soft, bsx4 active Musculoskeletal:  No acute deformities  Skin:  Warm/dry, no edema  LABS:  CBC  Recent Labs Lab 11/15/13 1405 11/15/13 1433 11/16/13 0957  WBC  --  20.5* 7.5  HGB 16.3*  16.3* 14.7 12.6  HCT 48.0*  48.0* 45.3 35.2*  PLT  --  421* 206   Coag's No results found for this basename: APTT, INR,  in the last 168 hours BMET  Recent Labs Lab 11/16/13 0957 11/16/13 1432 11/16/13 1915  NA 144 144 142  K 3.9 3.8 3.7  CL 109 109 106  CO2 21 24 23   BUN 29* 24* 19  CREATININE 0.68 0.62 0.53  GLUCOSE 168* 199* 158*   Electrolytes  Recent Labs Lab 11/16/13 0957 11/16/13 1432  11/16/13 1915  CALCIUM 8.6 8.6 8.6   Sepsis Markers  Recent Labs Lab 11/15/13 1702 11/16/13 0229  PROCALCITON 0.65 0.72   ABG  Recent Labs Lab 11/15/13 2258  PHART 7.320*  PCO2ART 35.7  PO2ART 68.0*   Liver Enzymes No results found for this basename: AST, ALT, ALKPHOS, BILITOT, ALBUMIN,  in the last 168 hours Cardiac Enzymes  Recent Labs Lab 11/15/13 1536 11/15/13 2100 11/16/13 0229  TROPONINI <0.30 <0.30 <0.30   Glucose  Recent Labs Lab 11/16/13 0355 11/16/13 0459 11/16/13 0603 11/16/13 0658 11/16/13 1552 11/16/13 1911  GLUCAP 114* 117* 114* 141* 145* 153*    Imaging Dg Chest Portable 1 View  11/15/2013   CLINICAL DATA:  Shortness of Breath  EXAM: PORTABLE CHEST - 1 VIEW  COMPARISON:  07/26/2013  FINDINGS: Cardiomediastinal silhouette is stable. No acute infiltrate or pleural effusion. No pulmonary edema. Bony thorax is unremarkable.  IMPRESSION: No active disease.   Electronically Signed   By: Natasha MeadLiviu  Pop M.D.   On: 11/15/2013 13:54   ASSESSMENT / PLAN:  ENDOCRINE A:   DKA - patient stopped insulin therapy at home P:   -admit to ICU -DKA protocol  -NS volume resuscitation x2L, then 125 ml/hr -hold basal insulin for now -d50 for hypogylcemia -r/o pregnancy, assess UDS  PULMONARY A: At Risk ATX Tobacco Abuse P:   -monitor respiratory status closely -aspiration precautions -oxygen to support sats >93%  CARDIOVASCULAR A:  Tachycardia - in setting of DKA P:  -tele monitoring  -assess troponin, EKG  RENAL A:   Metabolic Acidosis - in setting of DKA with appropriate resp compensation Elevated Anion Gap Hyperkalemia P:   -Q2 BMP to monitor for AG closure -NS as above / volume resuscitation  -place foley to monitor I/O's  GASTROINTESTINAL A:   NPO P:   -NPO until mental status clears  HEMATOLOGIC A:   Hemoconcentration  P:  -monitor CBC -Heparin for DVT prophylaxis   INFECTIOUS A:   No over hx of infectious process -  ? Hx  of n/v on day of admit but likely related to DKA.  P:   -culture as above -hold abx  NEUROLOGIC A:   Acute Metabolic Encephalopathy  P:   -supportive care -monitor mental status  Canary BrimBrandi Ollis, NP-C [facility] Pgr: [phone] or [phone]    I have personally obtained a history, examined the patient, evaluated laboratory and imaging results, formulated the assessment and plan and placed orders.  CRITICAL CARE: The patient is critically ill with multiple organ systems failure and requires high complexity decision making for assessment and support, frequent evaluation and titration of therapies, application of advanced monitoring technologies and extensive  interpretation of multiple databases. Critical Care Time devoted to patient care services described in this note is __ minutes.   Billy Fischer, MD ; [facility] [phone].  After 5:30 PM or weekends, call [phone]  11/16/2013, 8:01 PM

## 2013-11-16 NOTE — Progress Notes (Signed)
Name: Kristine Allison MRN: [id number] DOB: 1981-02-22    ADMISSION DATE:  11/15/2013 CONSULTATION DATE:  11/15/2013  REFERRING MD :  EDP PRIMARY SERVICE: PCCM  CHIEF COMPLAINT:  DKA  BRIEF PATIENT DESCRIPTION: 33 y/o F, DM I, admitted on 2/24 with lethargy / AMS & weakness. Found to have DKA  SIGNIFICANT EVENTS / STUDIES:  2/24 - Admit with DKA. Per family, not taking insulin as prescribed. Urine pregnancy>>>  LINES / TUBES: PIVs  CULTURES: UA 2/24 >> NEG  UC 2/24 >> N/I   ANTIBIOTICS: none  SUBJECTIVE: gap closing overnight. Required one amp bicarb. Febrile to 100.7 overnight no obvious source of infection.  VITAL SIGNS: Temp:  [97.6 F (36.4 C)-100.7 F (38.2 C)] 98.3 F (36.8 C) (02/25 0437) Pulse Rate:  [87-132] 87 (02/25 0700) Resp:  [12-40] 16 (02/25 0700) BP: (74-113)/(33-63) 105/60 mmHg (02/25 0700) SpO2:  [95 %-100 %] 100 % (02/25 0700) HEMODYNAMICS:   VENTILATOR SETTINGS:   INTAKE / OUTPUT: Intake/Output     02/24 0701 - 02/25 0700 02/25 0701 - 02/26 0700   I.V. 3367.5    IV Piggyback 200    Total Intake 3567.5     Urine 1285    Total Output 1285     Net +2282.5            PHYSICAL EXAMINATION: General: Well developed adult female in NAD  Neuro: awakens to voice, oriented x3 HEENT: dry MM, NCAT  Cardiovascular: rrr, no murmur appreciated  Lungs: resp's even, non-labored, lungs bilaterally clear  Abdomen: Round/soft, bsx4 active  Musculoskeletal: No acute deformities  Skin: Warm/dry, no edema  LABS:  CBC  Recent Labs Lab 11/15/13 1405 11/15/13 1433  WBC  --  20.5*  HGB 16.3* 14.7  HCT 48.0* 45.3  PLT  --  421*   Coag's No results found for this basename: APTT, INR,  in the last 168 hours BMET  Recent Labs Lab 11/16/13 0229 11/16/13 0447 11/16/13 0603  NA 147 144 148*  K 4.6 5.1 4.2  CL 108 111 110  CO2 21 19 22   BUN 40* 35* 35*  CREATININE 0.98 0.74 0.81  GLUCOSE 140* 120* 123*   Electrolytes  Recent  Labs Lab 11/16/13 0229 11/16/13 0447 11/16/13 0603  CALCIUM 8.3* 8.4 8.5   Sepsis Markers  Recent Labs Lab 11/15/13 1702 11/16/13 0229  PROCALCITON 0.65 0.72   ABG  Recent Labs Lab 11/15/13 2258  PHART 7.320*  PCO2ART 35.7  PO2ART 68.0*   Liver Enzymes No results found for this basename: AST, ALT, ALKPHOS, BILITOT, ALBUMIN,  in the last 168 hours Cardiac Enzymes  Recent Labs Lab 11/15/13 1536 11/15/13 2100 11/16/13 0229  TROPONINI <0.30 <0.30 <0.30   Glucose  Recent Labs Lab 11/15/13 2147 11/15/13 2252 11/15/13 2352 11/16/13 0058 11/16/13 0158 11/16/13 0257  GLUCAP 263* 231* 213* 173* 147* 134*    Imaging Dg Chest Portable 1 View  11/15/2013   CLINICAL DATA:  Shortness of Breath  EXAM: PORTABLE CHEST - 1 VIEW  COMPARISON:  07/26/2013  FINDINGS: Cardiomediastinal silhouette is stable. No acute infiltrate or pleural effusion. No pulmonary edema. Bony thorax is unremarkable.  IMPRESSION: No active disease.   Electronically Signed   By: Natasha Mead M.D.   On: 11/15/2013 13:54    ASSESSMENT / PLAN:  ENDOCRINE  A:  DKA - patient stopped insulin therapy at home related to not asking for refills - gap to 16 from 55 on admission -UDS neg P:  -  admit to ICU  -DKA protocol , once gap closes transition off gtt with lantus -d5 1/2 NS  -d50 for hypogylcemia  -r/o pregnancy     PULMONARY  A:  At Risk ATX  Tobacco Abuse  P:  -monitor respiratory status closely  -aspiration precautions  -oxygen to support sats >92%   CARDIOVASCULAR  A:  Tachycardia - in setting of DKA  P:  -tele monitoring  -troponin neg   RENAL  A:  Metabolic Acidosis - in setting of DKA with appropriate resp compensation  Elevated Anion Gap  Hyperkalemia  P:  -Q4 BMP to monitor for AG closure -D5 1/2 NS 100/hr -place foley to monitor I/O's   GASTROINTESTINAL  A:  NPO  P:  -NPO until mental status clears   HEMATOLOGIC  A:  Hemoconcentration  P:  -f/u am cbc    -Heparin for DVT prophylaxis   INFECTIOUS  A:  No over hx of infectious process - ? Hx of n/v on day of admit but likely related to DKA.  P:  -culture as above  -hold abx given low pct  NEUROLOGIC  A:  Acute Metabolic Encephalopathy  P:  -supportive care  -monitor mental status   TODAY'S SUMMARY: monitor for gap closure, continue DKA protocol, potential transfer to floor once gap closes and off insulin drip, Non compliance needs to be addressed in future conversations  I have personally obtained a history, examined the patient, evaluated laboratory and imaging results, formulated the assessment and plan and placed orders. CRITICAL CARE: The patient is critically ill with multiple organ systems failure and requires high complexity decision making for assessment and support, frequent evaluation and titration of therapies, application of advanced monitoring technologies and extensive interpretation of multiple databases. Critical Care Time devoted to patient care services described in this note is 31 minutes.   Oretha MilchALVA,RAKESH V.  Pulmonary and Critical Care Medicine [facility] Pager: [phone]  11/16/2013, 7:22 AM

## 2013-11-16 NOTE — Progress Notes (Signed)
Spoke to patient briefly.  She states that she was without insulin for 1 1/2 day prior to coming to hospital.  [facility]  Last A1C was greater than 14.0%.  [facility].  Discussed with case management regarding eligibility for orange card?    May be helpful to recheck A1C.  Beryl MeagerJenny Laddie Math, RN, BC-ADM Inpatient Diabetes Coordinator Pager [phone]

## 2013-11-16 NOTE — Progress Notes (Signed)
Nutrition Brief Note  Reason for Assessment: MST  33 y.o. female  Admitting Dx: Diabetic ketoacidosis  ASSESSMENT: 33 y/o F with PMH of asthma & DM-I who [facility] with report of incerasing lethargy, generalized weakness and high blood sugars. Reportedly, patient has not been taking insulin as prescribed. Has had few episodes of nausea, vomiting on 2/24.   Pt was difficult to communicate with as she reports to be very tired. Pt responds with either yes or no and short word answers. Pt reports a good appetite at home. Pt denies any stomach pains, nausea, and any difficulties eating or swallowing. Pt reports no recent weight loss and does not know her usual body weight. Pt reports getting around at home fine without any trouble. Pt reports only yesterday is when she started to feel very tired and weak. Pt is currently NPO. Pt observed with no signs or fat or muscle mass loss.   No nutrition intervention at this time. If pt PO intake is poor after diet advancement, recommend providing oral supplement (glucerna) or consulting nutrition management for further evaluation/assessment.   Nutrition Focused Physical Exam:  Subcutaneous Fat:  Orbital Region: WNL Upper Arm Region: WNL Thoracic and Lumbar Region: WNL  Muscle:  Temple Region: WNL Clavicle Bone Region: WNL Clavicle and Acromion Bone Region: WNL Scapular Bone Region: N/A Dorsal Hand: WNL Patellar Region: WNL Anterior Thigh Region: WNL Posterior Calf Region: WNL  Edema: none   Height: Ht Readings from Last 1 Encounters:  10/03/13 5\' 4"  (1.626 m)    Weight: Wt Readings from Last 1 Encounters:  10/06/13 106 lb (48.081 kg)  11/16/13 110 lb (50.3 kg)  BMI=19.03  Wt Readings from Last 10 Encounters:  10/06/13 106 lb (48.081 kg)  10/03/13 105 lb (47.628 kg)  09/08/13 105 lb (47.628 kg)  08/01/13 129 lb (58.514 kg)  07/26/13 113 lb 5.1 oz (51.4 kg)  06/05/13 106 lb 7.7 oz (48.3 kg)  06/24/12 139  lb (63.05 kg)  06/18/12 118 lb 13.3 oz (53.9 kg)    Marijean NiemannStephanie La Dietetic Intern Pager: [phone]  I agree with student dietitian note; appropriate revisions have been made.  Joaquin CourtsKimberly Harris, RD, LDN, CNSC Pager# [phone] After Hours Pager# [phone]

## 2013-11-17 ENCOUNTER — Other Ambulatory Visit: Payer: Self-pay

## 2013-11-17 LAB — BASIC METABOLIC PANEL
BUN: 16 mg/dL (ref 6–23)
BUN: 17 mg/dL (ref 6–23)
BUN: 22 mg/dL (ref 6–23)
CHLORIDE: 100 meq/L (ref 96–112)
CO2: 23 mEq/L (ref 19–32)
CO2: 22 mEq/L (ref 19–32)
CO2: 21 mEq/L (ref 19–32)
Calcium: 8.4 mg/dL (ref 8.4–10.5)
Calcium: 8.5 mg/dL (ref 8.4–10.5)
Calcium: 8.8 mg/dL (ref 8.4–10.5)
Chloride: 105 mEq/L (ref 96–112)
Chloride: 105 mEq/L (ref 96–112)
Creatinine, Ser: 0.6 mg/dL (ref 0.50–1.10)
Creatinine, Ser: 0.61 mg/dL (ref 0.50–1.10)
Creatinine, Ser: 0.63 mg/dL (ref 0.50–1.10)
GFR calc non Af Amer: >90 mL/min (ref >90)
GLUCOSE: 109 mg/dL — AB (ref 70–99)
Glucose, Bld: 155 mg/dL — ABNORMAL HIGH (ref 70–99)
Glucose, Bld: 304 mg/dL — ABNORMAL HIGH (ref 70–99)
POTASSIUM: 3.8 meq/L (ref 3.7–5.3)
POTASSIUM: 4 meq/L (ref 3.7–5.3)
POTASSIUM: 4 meq/L (ref 3.7–5.3)
SODIUM: 139 meq/L (ref 137–147)
SODIUM: 139 meq/L (ref 137–147)
Sodium: 134 mEq/L — ABNORMAL LOW (ref 137–147)

## 2013-11-17 LAB — GLUCOSE, CAPILLARY
GLUCOSE-CAPILLARY: 184 mg/dL — AB (ref 70–99)
GLUCOSE-CAPILLARY: 278 mg/dL — AB (ref 70–99)
Glucose-Capillary: 126 mg/dL — ABNORMAL HIGH (ref 70–99)
Glucose-Capillary: 180 mg/dL — ABNORMAL HIGH (ref 70–99)
Glucose-Capillary: 194 mg/dL — ABNORMAL HIGH (ref 70–99)
Glucose-Capillary: 96 mg/dL (ref 70–99)

## 2013-11-17 LAB — URINE CULTURE
CULTURE: NO GROWTH
Colony Count: NO GROWTH

## 2013-11-17 LAB — CBC
HCT: 31.6 % — ABNORMAL LOW (ref 36.0–46.0)
Hemoglobin: 11.1 g/dL — ABNORMAL LOW (ref 12.0–15.0)
MCH: 32.4 pg (ref 26.0–34.0)
MCHC: 35.1 g/dL (ref 30.0–36.0)
MCV: 92.1 fL (ref 78.0–100.0)
Platelets: 147 10*3/uL — ABNORMAL LOW (ref 150–400)
RBC: 3.43 MIL/uL — ABNORMAL LOW (ref 3.87–5.11)
RDW: 14.7 % (ref 11.5–15.5)
WBC: 4.4 10*3/uL (ref 4.0–10.5)

## 2013-11-17 LAB — PROCALCITONIN: Procalcitonin: 0.17 ng/mL

## 2013-11-17 MED ORDER — PHENOL 1.4 % MT LIQD
1.0000 | OROMUCOSAL | Status: DC | PRN
  Administered 2013-11-17 – 2013-11-18 (×2): 1 via OROMUCOSAL
  Filled 2013-11-17: qty 177

## 2013-11-17 MED ORDER — INSULIN ASPART 100 UNIT/ML ~~LOC~~ SOLN: 0.0000 [IU] | Freq: Three times a day (TID) | SUBCUTANEOUS | Status: DC

## 2013-11-17 MED ORDER — FAMOTIDINE 20 MG PO TABS
20.0000 mg | ORAL_TABLET | Freq: Two times a day (BID) | ORAL | Status: DC
  Administered 2013-11-17 – 2013-11-18 (×3): 20 mg via ORAL
  Filled 2013-11-17 (×4): qty 1

## 2013-11-17 MED ORDER — INSULIN ASPART 100 UNIT/ML ~~LOC~~ SOLN
0.0000 [IU] | Freq: Three times a day (TID) | SUBCUTANEOUS | Status: DC
  Administered 2013-11-17: 5 [IU] via SUBCUTANEOUS
  Administered 2013-11-18: 3 [IU] via SUBCUTANEOUS
  Administered 2013-11-18: 2 [IU] via SUBCUTANEOUS
  Administered 2013-11-18: 1 [IU] via SUBCUTANEOUS
  Administered 2013-11-19 (×2): 3 [IU] via SUBCUTANEOUS
  Administered 2013-11-19: 4 [IU] via SUBCUTANEOUS
  Administered 2013-11-20: 5 [IU] via SUBCUTANEOUS

## 2013-11-17 MED ORDER — INSULIN ASPART 100 UNIT/ML ~~LOC~~ SOLN: 0.0000 [IU] | Freq: Every day | SUBCUTANEOUS | Status: DC

## 2013-11-17 NOTE — Progress Notes (Signed)
Name: Kristine Allison MRN: [id number] DOB: 1981/05/04    ADMISSION DATE:  11/15/2013 CONSULTATION DATE:  11/15/2013  REFERRING MD :  EDP PRIMARY SERVICE: PCCM  CHIEF COMPLAINT:  DKA  BRIEF PATIENT DESCRIPTION: 33 y/o F, DM I, admitted on 2/24 with lethargy / AMS & weakness. Found to have DKA  SIGNIFICANT EVENTS / STUDIES:  2/24 - Admit with DKA. Per family, not taking insulin as prescribed.  2/25 - off insulin drip, transitioned well to lantus  LINES / TUBES: PIVs  CULTURES: UA 2/24 >> NEG  UC 2/24 >> Neg  ANTIBIOTICS: none  SUBJECTIVE: off the insulin drip. On lantus and doing well.  VITAL SIGNS: Temp:  [98.1 F (36.7 C)-99.2 F (37.3 C)] 99 F (37.2 C) (02/26 0354) Pulse Rate:  [54-100] 87 (02/26 0600) Resp:  [14-27] 21 (02/26 0600) BP: (92-124)/(41-91) 124/91 mmHg (02/26 0600) SpO2:  [95 %-100 %] 98 % (02/26 0600) Weight:  [50.3 kg (110 lb 14.3 oz)] 50.3 kg (110 lb 14.3 oz) (02/25 1220) HEMODYNAMICS:   VENTILATOR SETTINGS:   INTAKE / OUTPUT: Intake/Output     02/25 0701 - 02/26 0700   P.O. 240   I.V. (mL/kg) 1440 (28.6)   Total Intake(mL/kg) 1680 (33.4)   Urine (mL/kg/hr) 1495 (1.2)   Total Output 1495   Net +185         PHYSICAL EXAMINATION: General: Well developed adult female in NAD  Neuro: awakens to voice HEENT: dry MM, NCAT  Cardiovascular: rrr, no murmur appreciated  Lungs: resp's even, non-labored, lungs bilaterally clear  Abdomen: Round/soft, bs active  Musculoskeletal: No acute deformities  Skin: Warm/dry, no edema  LABS:  CBC  Recent Labs Lab 11/15/13 1405 11/15/13 1433 11/16/13 0957  WBC  --  20.5* 7.5  HGB 16.3*  16.3* 14.7 12.6  HCT 48.0*  48.0* 45.3 35.2*  PLT  --  421* 206   Coag's No results found for this basename: APTT, INR,  in the last 168 hours BMET  Recent Labs Lab 11/16/13 2210 11/17/13 0215 11/17/13 0605  NA 140 139 139  K 3.3* 3.8 4.0  CL 105 105 105  CO2 23 23 22   BUN 18 16 17    CREATININE 0.61 0.60 0.61  GLUCOSE 169* 109* 155*   Electrolytes  Recent Labs Lab 11/16/13 1915 11/16/13 2210 11/17/13 0215  CALCIUM 8.6 8.5 8.4   Sepsis Markers  Recent Labs Lab 11/15/13 1702 11/16/13 0229 11/17/13 0215  PROCALCITON 0.65 0.72 0.17   ABG  Recent Labs Lab 11/15/13 2258  PHART 7.320*  PCO2ART 35.7  PO2ART 68.0*   Liver Enzymes No results found for this basename: AST, ALT, ALKPHOS, BILITOT, ALBUMIN,  in the last 168 hours Cardiac Enzymes  Recent Labs Lab 11/15/13 1536 11/15/13 2100 11/16/13 0229  TROPONINI <0.30 <0.30 <0.30   Glucose  Recent Labs Lab 11/16/13 1911 11/16/13 2018 11/16/13 2120 11/16/13 2213 11/16/13 2352 11/17/13 0354  GLUCAP 153* 146* 220* 154* 96 126*    Imaging Dg Chest Portable 1 View  11/15/2013   CLINICAL DATA:  Shortness of Breath  EXAM: PORTABLE CHEST - 1 VIEW  COMPARISON:  07/26/2013  FINDINGS: Cardiomediastinal silhouette is stable. No acute infiltrate or pleural effusion. No pulmonary edema. Bony thorax is unremarkable.  IMPRESSION: No active disease.   Electronically Signed   By: Natasha Mead M.D.   On: 11/15/2013 13:54    ASSESSMENT / PLAN:  ENDOCRINE  A:  DKA - patient stopped insulin therapy at home related to  not asking for refills - gap closed -UDS neg P:  -admit to ICU  -transitioned to lantus - may need a simplified regimen at home  -d50 for hypogylcemia   -SSI  PULMONARY  A:  At Risk ATX  Tobacco Abuse  P:  -monitor respiratory status closely  -aspiration precautions  -oxygen to support sats >92%   CARDIOVASCULAR  A:  Tachycardia - resolved Intermittent Bradycardia - appears sinus on EKG P:  -monitor  -troponin neg   RENAL  A:  Metabolic Acidosis - resolved Elevated Anion Gap - resolved Hyperkalemia - resolved P:  -daily BMP ok -remove foley  GASTROINTESTINAL  A:  No acute issues  P:  -clear liquid, advance as tolerated  HEMATOLOGIC  A:  Hemoconcentration  P:   -f/u am cbc  -Heparin for DVT prophylaxis   INFECTIOUS  A:  No acute issues P:  -culture as above  -hold abx given low pct  NEUROLOGIC  A:  Acute Metabolic Encephalopathy - improving P:  -supportive care  -monitor mental status   TODAY'S SUMMARY: continue lantus, transfer to floor and FMTS Unclear cause of non compliance ? Personality issues , will simplifying insulin regimen be helpful? PCCM to sign off   I have personally obtained a history, examined the patient, evaluated laboratory and imaging results, formulated the assessment and plan and placed orders.  ALVA,RAKESH V.

## 2013-11-18 ENCOUNTER — Encounter (HOSPITAL_COMMUNITY): Payer: Self-pay | Admitting: Cardiology

## 2013-11-18 DIAGNOSIS — I214 Non-ST elevation (NSTEMI) myocardial infarction: Secondary | ICD-10-CM | POA: Insufficient documentation

## 2013-11-18 DIAGNOSIS — E119 Type 2 diabetes mellitus without complications: Secondary | ICD-10-CM

## 2013-11-18 DIAGNOSIS — R072 Precordial pain: Secondary | ICD-10-CM

## 2013-11-18 DIAGNOSIS — R079 Chest pain, unspecified: Secondary | ICD-10-CM | POA: Diagnosis present

## 2013-11-18 LAB — URINALYSIS, ROUTINE W REFLEX MICROSCOPIC
Glucose, UA: >1000 mg/dL — AB
KETONES UR: 40 mg/dL — AB
NITRITE: NEGATIVE
PROTEIN: NEGATIVE mg/dL
Specific Gravity, Urine: 1.02 (ref 1.005–1.030)
UROBILINOGEN UA: 0.2 mg/dL (ref 0.0–1.0)
pH: 6 (ref 5.0–8.0)

## 2013-11-18 LAB — HEMOGLOBIN A1C
Hgb A1c MFr Bld: 13.2 % — ABNORMAL HIGH (ref <5.7)
Mean Plasma Glucose: 332 mg/dL — ABNORMAL HIGH (ref <117)

## 2013-11-18 LAB — BASIC METABOLIC PANEL
BUN: 22 mg/dL (ref 6–23)
BUN: 16 mg/dL (ref 6–23)
CALCIUM: 8.7 mg/dL (ref 8.4–10.5)
CO2: 21 mEq/L (ref 19–32)
CO2: 24 mEq/L (ref 19–32)
Calcium: 8.4 mg/dL (ref 8.4–10.5)
Chloride: 102 mEq/L (ref 96–112)
Chloride: 101 mEq/L (ref 96–112)
Creatinine, Ser: 0.63 mg/dL (ref 0.50–1.10)
Creatinine, Ser: 0.59 mg/dL (ref 0.50–1.10)
GFR calc Af Amer: >90 mL/min (ref >90)
GFR calc non Af Amer: >90 mL/min (ref >90)
GLUCOSE: 250 mg/dL — AB (ref 70–99)
Glucose, Bld: 178 mg/dL — ABNORMAL HIGH (ref 70–99)
POTASSIUM: 3.4 meq/L — AB (ref 3.7–5.3)
Potassium: 4.3 mEq/L (ref 3.7–5.3)
SODIUM: 137 meq/L (ref 137–147)
Sodium: 139 mEq/L (ref 137–147)

## 2013-11-18 LAB — URINE MICROSCOPIC-ADD ON

## 2013-11-18 LAB — HEPARIN LEVEL (UNFRACTIONATED): Heparin Unfractionated: 0.24 IU/mL — ABNORMAL LOW (ref 0.30–0.70)

## 2013-11-18 LAB — PREGNANCY, URINE: PREG TEST UR: NEGATIVE

## 2013-11-18 LAB — GLUCOSE, CAPILLARY
Glucose-Capillary: 234 mg/dL — ABNORMAL HIGH (ref 70–99)
Glucose-Capillary: 157 mg/dL — ABNORMAL HIGH (ref 70–99)
Glucose-Capillary: 138 mg/dL — ABNORMAL HIGH (ref 70–99)

## 2013-11-18 LAB — TROPONIN I
TROPONIN I: 0.6 ng/mL — AB (ref <0.30)
TROPONIN I: 0.45 ng/mL — AB (ref <0.30)
Troponin I: 0.58 ng/mL (ref <0.30)

## 2013-11-18 MED ORDER — GI COCKTAIL ~~LOC~~
30.0000 mL | Freq: Three times a day (TID) | ORAL | Status: DC | PRN
  Administered 2013-11-19 – 2013-11-20 (×4): 30 mL via ORAL
  Filled 2013-11-18 (×4): qty 30

## 2013-11-18 MED ORDER — GI COCKTAIL ~~LOC~~
30.0000 mL | Freq: Once | ORAL | Status: AC
  Administered 2013-11-18: 30 mL via ORAL
  Filled 2013-11-18: qty 30

## 2013-11-18 MED ORDER — NITROGLYCERIN 0.4 MG SL SUBL
0.4000 mg | SUBLINGUAL_TABLET | SUBLINGUAL | Status: DC | PRN
  Filled 2013-11-18 (×2): qty 25

## 2013-11-18 MED ORDER — CALCIUM CARBONATE ANTACID 500 MG PO CHEW
1.0000 | CHEWABLE_TABLET | Freq: Three times a day (TID) | ORAL | Status: DC | PRN
  Administered 2013-11-18: 200 mg via ORAL
  Filled 2013-11-18: qty 1

## 2013-11-18 MED ORDER — ASPIRIN EC 81 MG PO TBEC
81.0000 mg | DELAYED_RELEASE_TABLET | Freq: Every day | ORAL | Status: DC
  Administered 2013-11-19 – 2013-11-20 (×2): 81 mg via ORAL
  Filled 2013-11-18 (×2): qty 1

## 2013-11-18 MED ORDER — ASPIRIN 81 MG PO CHEW
81.0000 mg | CHEWABLE_TABLET | Freq: Once | ORAL | Status: AC
  Administered 2013-11-18: 81 mg via ORAL
  Filled 2013-11-18: qty 1

## 2013-11-18 MED ORDER — PANTOPRAZOLE SODIUM 40 MG PO TBEC
40.0000 mg | DELAYED_RELEASE_TABLET | Freq: Every day | ORAL | Status: DC
  Administered 2013-11-18 – 2013-11-20 (×3): 40 mg via ORAL
  Filled 2013-11-18 (×3): qty 1

## 2013-11-18 MED ORDER — HEPARIN (PORCINE) IN NACL 100-0.45 UNIT/ML-% IJ SOLN
900.0000 [IU]/h | INTRAMUSCULAR | Status: DC
  Administered 2013-11-18: 600 [IU]/h via INTRAVENOUS
  Administered 2013-11-19 (×2): 900 [IU]/h via INTRAVENOUS
  Filled 2013-11-18 (×2): qty 250

## 2013-11-18 MED ORDER — NITROGLYCERIN 0.4 MG SL SUBL: 0.4000 mg | SUBLINGUAL_TABLET | SUBLINGUAL | Status: DC | PRN

## 2013-11-18 MED ORDER — INSULIN ASPART PROT & ASPART (70-30 MIX) 100 UNIT/ML ~~LOC~~ SUSP
10.0000 [IU] | Freq: Two times a day (BID) | SUBCUTANEOUS | Status: DC
  Administered 2013-11-18 – 2013-11-20 (×5): 10 [IU] via SUBCUTANEOUS
  Filled 2013-11-18: qty 10

## 2013-11-18 MED ORDER — HEPARIN BOLUS VIA INFUSION
3000.0000 [IU] | Freq: Once | INTRAVENOUS | Status: AC
  Administered 2013-11-18: 3000 [IU] via INTRAVENOUS
  Filled 2013-11-18: qty 3000

## 2013-11-18 NOTE — Progress Notes (Signed)
CRITICAL VALUE ALERT  Critical value received:  Troponin 0.60  Date of notification:  11/18/2013  Time of notification:  1035  Critical value read back:yes  Nurse who received alert:  Lissa HoardHelena, RN  MD notified (1st page):  Dr. Richarda BladeAdamo  Time of first page:  1038  MD notified (2nd page):  Time of second page:  Responding MD:  Dr. Waynetta SandyWight  Time MD responded:  1040

## 2013-11-18 NOTE — Progress Notes (Signed)
ANTICOAGULATION CONSULT NOTE   Pharmacy Consult for Heparin Indication: chest pain/ACS  Allergies  Allergen Reactions  . Latex Rash    Patient Measurements: Height: 5\' 4"  (162.6 cm) Weight: 110 lb 14.3 oz (50.3 kg) IBW/kg (Calculated) : 54.7 Heparin Dosing Weight: 50 kg  Vital Signs: Temp: 97.4 F (36.3 C) (02/27 1829) Temp src: Oral (02/27 1829) BP: 98/50 mmHg (02/27 1829) Pulse Rate: 74 (02/27 1829)  Labs:  Recent Labs  11/16/13 0229  11/16/13 0957  11/17/13 0605 11/17/13 1828 11/18/13 0410 11/18/13 0940 11/18/13 1536 11/18/13 1754 11/18/13 1805  HGB  --   --  12.6  --  11.1*  --   --   --   --   --   --   HCT  --   --  35.2*  --  31.6*  --   --   --   --   --   --   PLT  --   --  206  --  147*  --   --   --   --   --   --   HEPARINUNFRC  --   --   --   --   --   --   --   --   --   --  0.24*  CREATININE 0.98  < > 0.68  < > 0.61 0.63 0.63  --   --  0.59  --   TROPONINI <0.30  --   --   --   --   --   --  0.60* 0.58*  --   --   < > = values in this interval not displayed.  Estimated Creatinine Clearance: 80.9 ml/min (by C-G formula based on Cr of 0.59).   Medical History: Past Medical History  Diagnosis Date  . Asthma   . Diabetes mellitus without complication   . Headache(784.0)     Medications:  Scheduled:  . [START ON 11/19/2013] aspirin EC  81 mg Oral Daily  . insulin aspart  0-5 Units Subcutaneous QHS  . insulin aspart  0-9 Units Subcutaneous TID WC  . insulin aspart protamine- aspart  10 Units Subcutaneous BID WC  . pantoprazole  40 mg Oral Daily    Assessment: 33 yo F admitted 2/24 with lethargy and AMS.  Found to be in DKA and started on insulin infusion.  DKA has resolved and patient was transitioned to SQ insulin.  Over the past 24 hours pt has reported a burning type of CP, not improved with SL NTG, no EKG changes, but Trop this AM elevated at 0.6.  Initiated on IV heparin this afternoon, initial heparin level just below goal. No bleeding  issues noted, will adjust rate and follow up with level in am.  Goal of Therapy:  Heparin level 0.3-0.7 units/ml Monitor platelets by anticoagulation protocol: Yes   Plan:  Increase heparin infusion 700 units/hr. Heparin level and CBC daily while on heparin.  Sheppard CoilFrank Trelon Plush PharmD., BCPS Clinical Pharmacist Pager [phone] 11/18/2013 7:48 PM

## 2013-11-18 NOTE — Progress Notes (Signed)
ANTICOAGULATION CONSULT NOTE - Initial Consult  Pharmacy Consult for Heparin Indication: chest pain/ACS  Allergies  Allergen Reactions  . Latex Rash    Patient Measurements: Height: 5\' 4"  (162.6 cm) Weight: 110 lb 14.3 oz (50.3 kg) IBW/kg (Calculated) : 54.7 Heparin Dosing Weight: 50 kg  Vital Signs: Temp: 98.4 F (36.9 C) (02/27 0836) Temp src: Oral (02/27 0836) BP: 99/46 mmHg (02/27 0836) Pulse Rate: 60 (02/27 0836)  Labs:  Recent Labs  11/15/13 1433  11/15/13 2100  11/16/13 0229  11/16/13 0957  11/17/13 0605 11/17/13 1828 11/18/13 0410 11/18/13 0940  HGB 14.7  --   --   --   --   --  12.6  --  11.1*  --   --   --   HCT 45.3  --   --   --   --   --  35.2*  --  31.6*  --   --   --   PLT 421*  --   --   --   --   --  206  --  147*  --   --   --   CREATININE  --   < > 1.52*  < > 0.98  < > 0.68  < > 0.61 0.63 0.63  --   TROPONINI  --   < > <0.30  --  <0.30  --   --   --   --   --   --  0.60*  < > = values in this interval not displayed.  Estimated Creatinine Clearance: 80.9 ml/min (by C-G formula based on Cr of 0.63).   Medical History: Past Medical History  Diagnosis Date  . Asthma   . Diabetes mellitus without complication   . Headache(784.0)     Medications:  Scheduled:  . aspirin  81 mg Oral Once  . famotidine  20 mg Oral BID  . heparin  5,000 Units Subcutaneous 3 times per day  . insulin aspart  0-5 Units Subcutaneous QHS  . insulin aspart  0-9 Units Subcutaneous TID WC  . insulin aspart protamine- aspart  10 Units Subcutaneous BID WC    Assessment: 33 yo F admitted 2/24 with lethargy and AMS.  Found to be in DKA and started on insulin infusion.  DKA has resolved and patient was transitioned to SQ insulin.  Over the past 24 hours pt has reported a burning type of CP, not improved with SL NTG, no EKG changes, but Trop this AM elevated at 0.6.  To initiate heparin for ACS, r/o MI.  Hgb and PLTC have been trending down since admission, though 2/2  dilution/rehydration with IVF given to treat DKA.  Will monitor closely.  Of note, pt was on SQ Heparin for VTE prophylaxis with last dose given 0500 this AM.  Goal of Therapy:  Heparin level 0.3-0.7 units/ml Monitor platelets by anticoagulation protocol: Yes   Plan:  Heparin 3000 unit IV bolus x 1 Heparin infusion at 600 units/hr. D/C SQ heparin. Heparin level in 6 hours. Heparin level and CBC daily while on heparin.  Toys 'R' UsKimberly Bali Lyn, Pharm.D., BCPS Clinical Pharmacist Pager [phone] 11/18/2013 11:28 AM

## 2013-11-18 NOTE — Progress Notes (Signed)
Nitro 0.4 mg SL given twice; patient stated that medication did not alleviate pain and did not wish to take third dose.  STAT EKG performed as ordered.  No other complaints.  HR 40-50s throughout night; otherwise vital signs stable.  Afterward, patient stated this has happened before at home and said that she has taken TUMS to reduce pain.  MD notified; awaiting orders.  Will continue to monitor.

## 2013-11-18 NOTE — Progress Notes (Signed)
Pt complaining of 9/10, throbbing, left-sided chest pain.  No complains of SOB, no radiating pain.  Also complains of sore throat.  MD notified; orders received.  Will continue to monitor.

## 2013-11-18 NOTE — Discharge Summary (Signed)
Family Medicine Teaching [facility] Discharge Summary  Patient name: Kristine Allison Medical record number: [id number] Date of birth: 05-Oct-1980 Age: 33 y.o. Gender: female Date of Admission: 11/15/2013  Date of Discharge: 11/20/2013 Admitting Physician: Merwyn Katos, MD  Primary Care Provider: Janit Pagan, MD Consultants: PCCM (admitted), Cardiology  Indication for Hospitalization: DKA  Discharge Diagnoses/Problem List:  Patient Active Problem List   Diagnosis Date Noted  . Elevated troponin I level 11/19/2013  . Chest pain 11/18/2013  . Acute myocardial infarction, subendocardial infarction, initial episode of care 11/18/2013  . DKA (diabetic ketoacidoses) 11/15/2013  . Noncompliance with medication regimen 11/15/2013  . Leg pain 09/08/2013  . Loss of weight 09/08/2013  . Depression 09/08/2013  . Hypotension, unspecified 09/08/2013  . Tobacco use disorder 08/01/2013  . Diabetes mellitus 06/24/2012  . GERD (gastroesophageal reflux disease) 06/17/2012    Disposition: home  Discharge Condition: improved  Discharge Exam:  General: well-appearing, NAD  HEENT: NCAT Cardiovascular: RRR, no murmur appreciated  Lungs: CTAB, no wheezing, crackles, or rhonchi. Normal WOB  Abdomen: soft, NTND, no guarding / no rebound, no masses, +active BS  Skin: Warm/dry, no edema  Neuro: awake, alert, oriented, grossly non-focal   Brief Hospital Course: Kristine Allison is a 33 y/o F who presented with lethargy, AMS, nausea/vomiting, dehydration, found to have DKA. Admitted to ICU for stabilization. Significant PMH: Type 2 DM (h/o non-adherence to insulin), GERD  # DKA, in setting of poorly controlled DM2, suspected secondary to non-adherence to home insulin - Resolved, described under hyperkalemia below.   # Acute Renal Failure - Resolved  # Dehydration, secondary to hypovolemia with hypotension - Resolved  # Hyperkalemia, secondary to DKA - Resolved On presentation  to ED found to be lethargic, AMS, fruity breath, work-up with hyperglycemia >700, high anion gap metabolic acidosis with resp compensation, ABG pH 7.32, HCO3 18.2, SCr 2.90, K 7.7, UDS (neg), Upreg (neg). Clinically dehydrated and hypotension with SBP 70s. Received 2L NS bolus, and started IV insulin, consulted PCCM for admission to ICU. Suspected trigger to DKA episode was non-adherence and recently stopped home insulin, otherwise CXR / UA negative for infectious trigger. Continued overall improvement on insulin drip and transitioned on 2/25 to SQ insulin. Transferred to regular floor for management by FPTS. Continued on reduced home 10u BID 70/30 insulin and SSI. Checked HgbA1c 13.2, continued DM education with DM coordinator. Prior to discharge her CBGs appropriately controlled.   # Chest pain, atypical in setting of elevated Troponin-I  Reported to have started on 2/26, atypical, burning / related to throat, no typical features, not relieved by nitro, mostly relieved by GI cocktail. (2/27) EKG with T-wave inversions in I and aVL, troponin elevated to 0.60, started Heparin gtt for ACS protocol. Consulted Cardiology with concern ACS, continued Heparin gtt and trend Troponins with improvement (0.60-->0.58-->0.45-->negative x 2), suspected EKG changes may be related to lead placement. ECHO (2/27) showing normal LVEF 50-55% and otherwise normal. Continue ASA 81mg  daily. Cardiology performed Lexiscan Myoview stress test (11/20/13), which was normal, they recommend daily aspirin.   # UTI, uncomplicated  On admission, initial work-up with UA (negative) and Urine culture (no growth). On day 5 of hospitalization, reported dysuria, repeat UA (2/27) demonstrated (WBC 7-10, trace leuk, microscopic hematuria), ordered repeat Urine culture, and empirically covered for UTI with Ceftriaxone. Dc'd on Keflex.   # Hypokalemia - Resolved  Issues for Follow Up:  # DM: unclear whether type I or II, needs insulin either way,  poor control may  be related to noncompliance but needs close follow-up and titration. Prefers to stick with 70/30 due to cost.   Significant Procedures: Lexiscan Myoview stress test (11/20/13)  IMPRESSION:  1. No scintigraphic evidence of prior infarction or  pharmacologically induced ischemia.  2. Normal wall motion. Ejection fraction is 65%.   Significant Labs and Imaging:   Recent Labs Lab 11/16/13 0957 11/17/13 0605 11/19/13 0543  WBC 7.5 4.4 5.1  HGB 12.6 11.1* 12.1  HCT 35.2* 31.6* 35.0*  PLT 206 147* 142*    Recent Labs Lab 11/17/13 1828 11/18/13 0410 11/18/13 1754 11/19/13 0543 11/20/13 0645  NA 134* 137 139 139 136*  K 4.0 4.3 3.4* 3.5* 4.0  CL 100 102 101 98 96  CO2 21 21 24 22 21   GLUCOSE 304* 250* 178* 184* 241*  BUN 22 22 16 11 11   CREATININE 0.63 0.63 0.59 0.54 0.53  CALCIUM 8.8 8.7 8.4 8.7 8.5     Recent Labs Lab 11/19/13 1143 11/19/13 1656 11/19/13 2143 11/20/13 0814 11/20/13 1210  GLUCAP 204* 236* 183* 210* 251*    Results/Tests Pending at Time of Discharge: urine culture  Discharge Medications:    Medication List         aspirin 81 MG EC tablet  Take 1 tablet (81 mg total) by mouth daily.     cephALEXin 500 MG capsule  Commonly known as:  KEFLEX  Take 1 capsule (500 mg total) by mouth 3 (three) times daily.     insulin NPH-regular Human (70-30) 100 UNIT/ML injection  Commonly known as:  NOVOLIN 70/30  Inject 15-20 Units into the skin 3 (three) times daily with meals. 20 units in AM, 10 units in PM, 10 units at Bedtime     metFORMIN 500 MG 24 hr tablet  Commonly known as:  GLUCOPHAGE-XR  Take 2 tablets (1,000 mg total) by mouth daily with breakfast.     PROAIR HFA 108 (90 BASE) MCG/ACT inhaler  Generic drug:  albuterol  Inhale 2 puffs into the lungs 2 (two) times daily as needed for wheezing or shortness of breath.        Discharge Instructions: Please refer to Patient Instructions section of EMR for full details.  Patient  was counseled important signs and symptoms that should prompt return to medical care, changes in medications, dietary instructions, activity restrictions, and follow up appointments.   Follow-Up Appointments: Follow-up Information   Follow up with Saralyn PilarKaramalegos, Alexander, DO On 11/28/2013. (For hospital follow up at 2pm)    Specialty:  Osteopathic Medicine   Contact information:   [address] [place] [address] [phone]       Elenora GammaSamuel L Nakeysha Pasqual, MD 11/20/2013, 12:30 PM PGY-1, [facility]

## 2013-11-18 NOTE — Progress Notes (Signed)
EKG d/w Dr. Jens Somrenshaw who believes changes r/t lead placement, still feel that sx consistent with GI etiology at this time. He is reading her echo now. Await next troponin for further information. Dayna Dunn PA-C

## 2013-11-18 NOTE — Consult Note (Signed)
HPI: 33 year old female with past medical history of diabetes mellitus for evaluation of non-ST elevation myocardial infarction. Patient has no prior cardiac history. She denies dyspnea on exertion, orthopnea, PND, pedal edema, syncope or exertional chest pain. Admitted on February 24 with nausea, vomiting, lethargy, altered mental status and weakness. She was found to be in DKA. She apparently was not taking her insulin. She has been treated. Last evening at approximately 7 PM she developed upper chest pain described as a burning sensation. It has been continuous without radiation. No associated symptoms. It improves with GI cocktail, drinking water and eating ice. Troponin elevated and cardiology asked to evaluate.  Medications Prior to Admission  Medication Sig Dispense Refill  . albuterol (PROAIR HFA) 108 (90 BASE) MCG/ACT inhaler Inhale 2 puffs into the lungs 2 (two) times daily as needed for wheezing or shortness of breath.      . insulin NPH-regular Human (NOVOLIN 70/30) (70-30) 100 UNIT/ML injection Inject 15-20 Units into the skin 3 (three) times daily with meals. 20 units in AM, 15 units in PM, 20 units at Bedtime      . metFORMIN (GLUCOPHAGE-XR) 500 MG 24 hr tablet Take 2 tablets (1,000 mg total) by mouth daily with breakfast.  60 tablet  4    Allergies  Allergen Reactions  . Latex Rash    Past Medical History  Diagnosis Date  . Asthma   . Diabetes mellitus without complication   . MPNTIRWE(315.4)     Past Surgical History  Procedure Laterality Date  . No past surgeries      History   Social History  . Marital Status: Divorced    Spouse Name: N/A    Number of Children: N/A  . Years of Education: N/A   Occupational History  . Not on file.   Social History Main Topics  . Smoking status: Current Every Day Smoker -- 0.40 packs/day for 20 years    Types: Cigarettes  . Smokeless tobacco: Never Used     Comment: only smoking electronic cigs  . Alcohol Use: No  .  Drug Use: No  . Sexual Activity: Not Currently   Other Topics Concern  . Not on file   Social History Narrative  . No narrative on file    History reviewed. No pertinent family history.  ROS:  no fevers or chills, productive cough, hemoptysis, dysphasia, odynophagia, melena, hematochezia, dysuria, hematuria, rash, seizure activity, orthopnea, PND, pedal edema, claudication. Remaining systems are negative.  Physical Exam:   Blood pressure 99/46, pulse 60, temperature 98.4 F (36.9 C), temperature source Oral, resp. rate 18, height _0  (1.626 m), weight 110 lb 14.3 oz (50.3 kg), SpO2 99.00%.  General:  Well developed/well nourished in NAD Skin warm/dry Patient not depressed No peripheral clubbing Back-normal HEENT-normal/normal eyelids Neck supple/normal carotid upstroke bilaterally; no bruits; no JVD; no thyromegaly chest - CTA/ normal expansion CV - regular, bradycardic/normal S1 and S2; no murmurs, rubs or gallops;  PMI nondisplaced Abdomen -NT/ND, no HSM, no mass, + bowel sounds, no bruit 2+ femoral pulses, no bruits Ext-no edema, chords, 2+ DP Neuro-grossly nonfocal  ECG marked sinus bradycardia, possible prior lateral infarct and lateral T-wave inversion.  Results for orders placed during the hospital encounter of 11/15/13 (from the past 48 hour(s))  GLUCOSE, CAPILLARY     Status: Abnormal   Collection Time    11/16/13 12:09 PM      Result Value Ref Range   Glucose-Capillary 152 (*) 70 - 99  mg/dL  GLUCOSE, CAPILLARY     Status: Abnormal   Collection Time    11/16/13  1:16 PM      Result Value Ref Range   Glucose-Capillary 254 (*) 70 - 99 mg/dL  GLUCOSE, CAPILLARY     Status: Abnormal   Collection Time    11/16/13  2:06 PM      Result Value Ref Range   Glucose-Capillary 233 (*) 70 - 99 mg/dL  BASIC METABOLIC PANEL     Status: Abnormal   Collection Time    11/16/13  2:32 PM      Result Value Ref Range   Sodium 144  137 - 147 mEq/L   Potassium 3.8  3.7 - 5.3  mEq/L   Chloride 109  96 - 112 mEq/L   CO2 24  19 - 32 mEq/L   Glucose, Bld 199 (*) 70 - 99 mg/dL   BUN 24 (*) 6 - 23 mg/dL   Creatinine, Ser 0.62  0.50 - 1.10 mg/dL   Calcium 8.6  8.4 - 10.5 mg/dL   GFR calc non Af Amer >90  >90 mL/min   GFR calc Af Amer >90  >90 mL/min   Comment: (NOTE)     The eGFR has been calculated using the CKD EPI equation.     This calculation has not been validated in all clinical situations.     eGFR's persistently <90 mL/min signify possible Chronic Kidney     Disease.  GLUCOSE, CAPILLARY     Status: Abnormal   Collection Time    11/16/13  3:13 PM      Result Value Ref Range   Glucose-Capillary 148 (*) 70 - 99 mg/dL  GLUCOSE, CAPILLARY     Status: Abnormal   Collection Time    11/16/13  3:52 PM      Result Value Ref Range   Glucose-Capillary 145 (*) 70 - 99 mg/dL  GLUCOSE, CAPILLARY     Status: Abnormal   Collection Time    11/16/13  5:00 PM      Result Value Ref Range   Glucose-Capillary 136 (*) 70 - 99 mg/dL  GLUCOSE, CAPILLARY     Status: Abnormal   Collection Time    11/16/13  6:12 PM      Result Value Ref Range   Glucose-Capillary 159 (*) 70 - 99 mg/dL  GLUCOSE, CAPILLARY     Status: Abnormal   Collection Time    11/16/13  7:11 PM      Result Value Ref Range   Glucose-Capillary 153 (*) 70 - 99 mg/dL  BASIC METABOLIC PANEL     Status: Abnormal   Collection Time    11/16/13  7:15 PM      Result Value Ref Range   Sodium 142  137 - 147 mEq/L   Potassium 3.7  3.7 - 5.3 mEq/L   Chloride 106  96 - 112 mEq/L   CO2 23  19 - 32 mEq/L   Glucose, Bld 158 (*) 70 - 99 mg/dL   BUN 19  6 - 23 mg/dL   Creatinine, Ser 0.53  0.50 - 1.10 mg/dL   Calcium 8.6  8.4 - 10.5 mg/dL   GFR calc non Af Amer >90  >90 mL/min   GFR calc Af Amer >90  >90 mL/min   Comment: (NOTE)     The eGFR has been calculated using the CKD EPI equation.     This calculation has not been validated in all clinical situations.  eGFR's persistently <90 mL/min signify possible  Chronic Kidney     Disease.  GLUCOSE, CAPILLARY     Status: Abnormal   Collection Time    11/16/13  8:18 PM      Result Value Ref Range   Glucose-Capillary 146 (*) 70 - 99 mg/dL  GLUCOSE, CAPILLARY     Status: Abnormal   Collection Time    11/16/13  9:20 PM      Result Value Ref Range   Glucose-Capillary 220 (*) 70 - 99 mg/dL  BASIC METABOLIC PANEL     Status: Abnormal   Collection Time    11/16/13 10:10 PM      Result Value Ref Range   Sodium 140  137 - 147 mEq/L   Potassium 3.3 (*) 3.7 - 5.3 mEq/L   Chloride 105  96 - 112 mEq/L   CO2 23  19 - 32 mEq/L   Glucose, Bld 169 (*) 70 - 99 mg/dL   BUN 18  6 - 23 mg/dL   Creatinine, Ser 0.61  0.50 - 1.10 mg/dL   Calcium 8.5  8.4 - 10.5 mg/dL   GFR calc non Af Amer >90  >90 mL/min   GFR calc Af Amer >90  >90 mL/min   Comment: (NOTE)     The eGFR has been calculated using the CKD EPI equation.     This calculation has not been validated in all clinical situations.     eGFR's persistently <90 mL/min signify possible Chronic Kidney     Disease.  GLUCOSE, CAPILLARY     Status: Abnormal   Collection Time    11/16/13 10:13 PM      Result Value Ref Range   Glucose-Capillary 154 (*) 70 - 99 mg/dL  GLUCOSE, CAPILLARY     Status: None   Collection Time    11/16/13 11:52 PM      Result Value Ref Range   Glucose-Capillary 96  70 - 99 mg/dL  BASIC METABOLIC PANEL     Status: Abnormal   Collection Time    11/17/13  2:15 AM      Result Value Ref Range   Sodium 139  137 - 147 mEq/L   Potassium 3.8  3.7 - 5.3 mEq/L   Chloride 105  96 - 112 mEq/L   CO2 23  19 - 32 mEq/L   Glucose, Bld 109 (*) 70 - 99 mg/dL   BUN 16  6 - 23 mg/dL   Creatinine, Ser 0.60  0.50 - 1.10 mg/dL   Calcium 8.4  8.4 - 10.5 mg/dL   GFR calc non Af Amer >90  >90 mL/min   GFR calc Af Amer >90  >90 mL/min   Comment: (NOTE)     The eGFR has been calculated using the CKD EPI equation.     This calculation has not been validated in all clinical situations.     eGFR's  persistently <90 mL/min signify possible Chronic Kidney     Disease.  PROCALCITONIN     Status: None   Collection Time    11/17/13  2:15 AM      Result Value Ref Range   Procalcitonin 0.17     Comment:            Interpretation:     PCT (Procalcitonin) <= 0.5 ng/mL:     Systemic infection (sepsis) is not likely.     Local bacterial infection is possible.     (NOTE)  ICU PCT Algorithm               Non ICU PCT Algorithm        ----------------------------     ------------------------------             PCT < 0.25 ng/mL                 PCT < 0.1 ng/mL         Stopping of antibiotics            Stopping of antibiotics           strongly encouraged.               strongly encouraged.        ----------------------------     ------------------------------           PCT level decrease by               PCT < 0.25 ng/mL           >= 80% from peak PCT           OR PCT 0.25 - 0.5 ng/mL          Stopping of antibiotics                                                 encouraged.         Stopping of antibiotics               encouraged.        ----------------------------     ------------------------------           PCT level decrease by              PCT >= 0.25 ng/mL           < 80% from peak PCT            AND PCT >= 0.5 ng/mL            Continuing antibiotics                                                  encouraged.           Continuing antibiotics                encouraged.        ----------------------------     ------------------------------         PCT level increase compared          PCT > 0.5 ng/mL             with peak PCT AND              PCT >= 0.5 ng/mL             Escalation of antibiotics                                              strongly encouraged.          Escalation of antibiotics  strongly encouraged.  GLUCOSE, CAPILLARY     Status: Abnormal   Collection Time    11/17/13  3:54 AM      Result Value Ref Range   Glucose-Capillary 126 (*) 70 - 99  mg/dL   Comment 1 Documented in Chart     Comment 2 Notify RN    BASIC METABOLIC PANEL     Status: Abnormal   Collection Time    11/17/13  6:05 AM      Result Value Ref Range   Sodium 139  137 - 147 mEq/L   Potassium 4.0  3.7 - 5.3 mEq/L   Chloride 105  96 - 112 mEq/L   CO2 22  19 - 32 mEq/L   Glucose, Bld 155 (*) 70 - 99 mg/dL   BUN 17  6 - 23 mg/dL   Creatinine, Ser 0.61  0.50 - 1.10 mg/dL   Calcium 8.5  8.4 - 10.5 mg/dL   GFR calc non Af Amer >90  >90 mL/min   GFR calc Af Amer >90  >90 mL/min   Comment: (NOTE)     The eGFR has been calculated using the CKD EPI equation.     This calculation has not been validated in all clinical situations.     eGFR's persistently <90 mL/min signify possible Chronic Kidney     Disease.  CBC     Status: Abnormal   Collection Time    11/17/13  6:05 AM      Result Value Ref Range   WBC 4.4  4.0 - 10.5 K/uL   RBC 3.43 (*) 3.87 - 5.11 MIL/uL   Hemoglobin 11.1 (*) 12.0 - 15.0 g/dL   HCT 31.6 (*) 36.0 - 46.0 %   MCV 92.1  78.0 - 100.0 fL   MCH 32.4  26.0 - 34.0 pg   MCHC 35.1  30.0 - 36.0 g/dL   RDW 14.7  11.5 - 15.5 %   Platelets 147 (*) 150 - 400 K/uL   Comment: REPEATED TO VERIFY     DELTA CHECK NOTED  GLUCOSE, CAPILLARY     Status: Abnormal   Collection Time    11/17/13  7:41 AM      Result Value Ref Range   Glucose-Capillary 184 (*) 70 - 99 mg/dL  GLUCOSE, CAPILLARY     Status: Abnormal   Collection Time    11/17/13 11:24 AM      Result Value Ref Range   Glucose-Capillary 180 (*) 70 - 99 mg/dL   Comment 1 Check Unit Policy    GLUCOSE, CAPILLARY     Status: Abnormal   Collection Time    11/17/13  4:22 PM      Result Value Ref Range   Glucose-Capillary 278 (*) 70 - 99 mg/dL  BASIC METABOLIC PANEL     Status: Abnormal   Collection Time    11/17/13  6:28 PM      Result Value Ref Range   Sodium 134 (*) 137 - 147 mEq/L   Potassium 4.0  3.7 - 5.3 mEq/L   Chloride 100  96 - 112 mEq/L   CO2 21  19 - 32 mEq/L   Glucose, Bld 304 (*)  70 - 99 mg/dL   BUN 22  6 - 23 mg/dL   Creatinine, Ser 0.63  0.50 - 1.10 mg/dL   Calcium 8.8  8.4 - 10.5 mg/dL   GFR calc non Af Amer >90  >90 mL/min   GFR calc Af Amer >90  >  90 mL/min   Comment: (NOTE)     The eGFR has been calculated using the CKD EPI equation.     This calculation has not been validated in all clinical situations.     eGFR's persistently <90 mL/min signify possible Chronic Kidney     Disease.  GLUCOSE, CAPILLARY     Status: Abnormal   Collection Time    11/17/13  8:59 PM      Result Value Ref Range   Glucose-Capillary 194 (*) 70 - 99 mg/dL  BASIC METABOLIC PANEL     Status: Abnormal   Collection Time    11/18/13  4:10 AM      Result Value Ref Range   Sodium 137  137 - 147 mEq/L   Potassium 4.3  3.7 - 5.3 mEq/L   Chloride 102  96 - 112 mEq/L   CO2 21  19 - 32 mEq/L   Glucose, Bld 250 (*) 70 - 99 mg/dL   BUN 22  6 - 23 mg/dL   Creatinine, Ser 0.63  0.50 - 1.10 mg/dL   Calcium 8.7  8.4 - 10.5 mg/dL   GFR calc non Af Amer >90  >90 mL/min   GFR calc Af Amer >90  >90 mL/min   Comment: (NOTE)     The eGFR has been calculated using the CKD EPI equation.     This calculation has not been validated in all clinical situations.     eGFR's persistently <90 mL/min signify possible Chronic Kidney     Disease.  GLUCOSE, CAPILLARY     Status: Abnormal   Collection Time    11/18/13  8:10 AM      Result Value Ref Range   Glucose-Capillary 234 (*) 70 - 99 mg/dL  TROPONIN I     Status: Abnormal   Collection Time    11/18/13  9:40 AM      Result Value Ref Range   Troponin I 0.60 (*) <0.30 ng/mL   Comment:            Due to the release kinetics of cTnI,     a negative result within the first hours     of the onset of symptoms does not rule out     myocardial infarction with certainty.     If myocardial infarction is still suspected,     repeat the test at appropriate intervals.     CRITICAL RESULT CALLED TO, READ BACK BY AND VERIFIED WITH:     STREET,H RN @ [id number]  11/18/13 LEONARD,A    Assessment/Plan 1 non-ST elevation myocardial infarction-the patient is having ongoing chest pain. Based on history her symptoms seem most consistent with GI etiology as they improve with GI cocktail, drinking water and eating ice. Her troponin is mildly elevated. However I wonder if this is residual from renal insufficiency at the time of admission. Her initial BUN and creatinine were 87 and 2.9 respectively. This has improved with hydration. Her electrocardiogram shows T-wave inversion laterally. I will repeat her electrocardiogram and troponin. Check echocardiogram for LV function. If her troponin continues to rise or there is a wall motion abnormality we will need to consider catheterization. If her troponin shows no further trend up and no wall motion abnormality on echo we will plan nuclear study for risk stratification. Continue aspirin and heparin for now. No beta blocker given severity of bradycardia. Add Protonix and discontinue Pepcid. 2 DKA-management per primary care. 3 tobacco abuse-patient counseled on discontinuing.  Kirk Ruths  MD 11/18/2013, 11:31 AM

## 2013-11-18 NOTE — Progress Notes (Signed)
Family Medicine Teaching Service Daily Progress Note Intern Pager: [phone]  Patient name: Crista ElliotMargie E Simmons-Cohn Medical record number: [id number] Date of birth: 21-Jun-1981 Age: 33 y.o. Gender: female  Primary Care Provider: Janit PaganENIOLA, KEHINDE, MD Consultants: none Code Status: full  Pt Overview and Major Events to Date:  2/24 - Admitted in DKA 2/25 - Insulin drip stopped, lantus started 2/26 - Out of ICU  Assessment and Plan: 33 y/o F, DM I, admitted on 2/24 with lethargy / AMS & weakness. Found to have DKA  # DKA: noncompliance per family, gap 41 and pH 7.32 on admission - gap closed and transitioned to SQ insulin on 2/25 - restarted 70/30 bid wc today - home regimen: 70/30, 20u am, 15u pm, 20qhs + metformin - CBGs 194-304 yesterday, 5u correction given - sensitive SSI  # Chest pain: starting yesterday, burning, no typical features - EKG with T-wave inversions in I and aVL, troponin 0.60 - not relieved by nitro, mostly relieved by GI cocktail - start heparin drip per pharmacy - consult cardiology - ASA and nitro  FEN/GI: regular diet, SLIV PPx: SQ heparin  Disposition: discharge home when po improves and cards work-up complete  Subjective: complaining of chest pain, burning, not relieved with nitro or chloraseptic, previously relieved by tums  Objective: Temp:  [97.9 F (36.6 C)-98.3 F (36.8 C)] 98.3 F (36.8 C) (02/27 0511) Pulse Rate:  [37-84] 43 (02/27 0652) Resp:  [13-20] 18 (02/27 0652) BP: (107-132)/(63-89) 107/63 mmHg (02/27 0652) SpO2:  [98 %-100 %] 100 % (02/27 14780652) Physical Exam: General: Well developed adult female in NAD  Neuro: awakens to voice  HEENT: dry MM, NCAT  Cardiovascular: rrr, no murmur appreciated  Lungs: resp's even, non-labored, lungs bilaterally clear  Abdomen: Round/soft, bs active  Musculoskeletal: No acute deformities  Skin: Warm/dry, no edema  Laboratory:  Recent Labs Lab 11/15/13 1433 11/16/13 0957 11/17/13 0605  WBC  20.5* 7.5 4.4  HGB 14.7 12.6 11.1*  HCT 45.3 35.2* 31.6*  PLT 421* 206 147*    Recent Labs Lab 11/17/13 0605 11/17/13 1828 11/18/13 0410  NA 139 134* 137  K 4.0 4.0 4.3  CL 105 100 102  CO2 22 21 21   BUN 17 22 22   CREATININE 0.61 0.63 0.63  CALCIUM 8.5 8.8 8.7  GLUCOSE 155* 304* 250*   Procalcitonin 0.17  ABG    Component Value Date/Time   PHART 7.320* 11/15/2013 2258   PCO2ART 35.7 11/15/2013 2258   PO2ART 68.0* 11/15/2013 2258   HCO3 18.2* 11/15/2013 2258   TCO2 19 11/15/2013 2258   ACIDBASEDEF 7.0* 11/15/2013 2258   O2SAT 91.0 11/15/2013 2258   Imaging/Diagnostic Tests: CXR: Cardiomediastinal silhouette is stable. No acute infiltrate or pleural effusion. No pulmonary edema. Bony thorax is unremarkable.  Beverely LowElena Krystyn Picking, MD 11/18/2013, 7:47 AM PGY-1, [facility] FPTS Intern pager: [phone], text pages welcome

## 2013-11-18 NOTE — Progress Notes (Signed)
Inpatient Diabetes Program Recommendations  AACE/ADA: New Consensus Statement on Inpatient Glycemic Control (2013)  Target Ranges:  Prepandial:   less than 140 mg/dL      Peak postprandial:   less than 180 mg/dL (1-2 hours)      Critically ill patients:  140 - 180 mg/dL   Reason for Visit: Elevated CBGs Results for Kristine ElliotSIMMONS-COHN, [name] (MRN [id number]) as of 11/18/2013 12:25  Ref. Range 11/17/2013 11:24 11/17/2013 16:22 11/17/2013 20:59 11/18/2013 08:10 11/18/2013 12:02  Glucose-Capillary Latest Range: 70-99 mg/dL 657180 (H) 846278 (H) 962194 (H) 234 (H) 157 (H)    Current orders for Inpatient glycemic control: Novolog 70/30 10 units BID, Novolog SENSITIVE correction scale AC & HS   Note: CBGs better today since starting on 70/30 insulin.  Started on CHO modified diet today.  If CBGs continue to be greater than 180 mg/dl, 95/2870/30 insulin may need to be increased, as patient is now eating. Will continue to follow while in hospital.  Smith MinceKendra Basha Krygier RN BSN CDE

## 2013-11-18 NOTE — Progress Notes (Signed)
  Echocardiogram 2D Echocardiogram has been performed.  Kristine Allison, Kristine Allison 11/18/2013, 3:05 PM

## 2013-11-18 NOTE — Progress Notes (Signed)
FMTS ATTENDING  NOTE Kristine Tarazon,MD I  have seen and examined this patient, reviewed their chart. I have discussed this patient with the resident. I agree with the resident's findings, assessment and care plan.  Patient feels ok this morning except for persistent central chest pain she has since yesterday, pain is burning in nature, feels like some irritation,she denies ano SOB,no N/V,no change in bowel habit. She denies any cough. She feels ok otherwise.  Filed Vitals:   11/17/13 1900 11/18/13 0511 11/18/13 0652 11/18/13 0836  BP:  117/71 107/63 99/46  Pulse:  54 43 60  Temp:  98.3 F (36.8 C)  98.4 F (36.9 C)  TempSrc:  Oral  Oral  Resp:  18 18 18   Height: 5\' 4"  (1.626 m)     Weight:      SpO2:  99% 100% 99%   Exam:  Gen: Not in distress. Resp: Air entry equal and clear. CV: S1 S2 normal,no murmur, RRR. Abd: Benign with no epigastric tenderness. Ext: No edema.  33 y/O F with 1. DKA: Resolved.     Poorly controlled DM: Due to medication  Non-adherence.     S/P Insulin drip,now on Novolog 70/30 and sliding scale.     Monitor CBG.     Plan to send home on Novolin 70/30 which is cheaper for her to afford.  2. Chest pain: GI vs Cardiac.     EKG and TNi check.     EKG reviewed with T wave inversion on lateral leads (Lead 1 and aVL), and bradycardia.     Plan to start on ASA and Heparin drip.     Repeat EKG and Cycle troponin.      Cardiology consult.  3. Bradycardia: Etiology unclear.     HR seem to fluctuate between 30 and 60.     Place on cardiac monitoring.     Avoid beta blocker.     Cardiology consult.

## 2013-11-19 DIAGNOSIS — R079 Chest pain, unspecified: Secondary | ICD-10-CM

## 2013-11-19 DIAGNOSIS — N179 Acute kidney failure, unspecified: Secondary | ICD-10-CM | POA: Diagnosis present

## 2013-11-19 DIAGNOSIS — R7989 Other specified abnormal findings of blood chemistry: Secondary | ICD-10-CM

## 2013-11-19 DIAGNOSIS — R778 Other specified abnormalities of plasma proteins: Secondary | ICD-10-CM | POA: Diagnosis not present

## 2013-11-19 DIAGNOSIS — K219 Gastro-esophageal reflux disease without esophagitis: Secondary | ICD-10-CM

## 2013-11-19 LAB — BASIC METABOLIC PANEL
BUN: 11 mg/dL (ref 6–23)
CALCIUM: 8.7 mg/dL (ref 8.4–10.5)
CO2: 22 mEq/L (ref 19–32)
Chloride: 98 mEq/L (ref 96–112)
Creatinine, Ser: 0.54 mg/dL (ref 0.50–1.10)
GFR calc Af Amer: >90 mL/min (ref >90)
GFR calc non Af Amer: >90 mL/min (ref >90)
GLUCOSE: 184 mg/dL — AB (ref 70–99)
Potassium: 3.5 mEq/L — ABNORMAL LOW (ref 3.7–5.3)
Sodium: 139 mEq/L (ref 137–147)

## 2013-11-19 LAB — TROPONIN I: Troponin I: <0.3 ng/mL (ref <0.30)

## 2013-11-19 LAB — CBC
HEMATOCRIT: 35 % — AB (ref 36.0–46.0)
HEMOGLOBIN: 12.1 g/dL (ref 12.0–15.0)
MCH: 32 pg (ref 26.0–34.0)
MCHC: 34.6 g/dL (ref 30.0–36.0)
MCV: 92.6 fL (ref 78.0–100.0)
Platelets: 142 10*3/uL — ABNORMAL LOW (ref 150–400)
RBC: 3.78 MIL/uL — ABNORMAL LOW (ref 3.87–5.11)
RDW: 13.8 % (ref 11.5–15.5)
WBC: 5.1 10*3/uL (ref 4.0–10.5)

## 2013-11-19 LAB — GLUCOSE, CAPILLARY
GLUCOSE-CAPILLARY: 221 mg/dL — AB (ref 70–99)
GLUCOSE-CAPILLARY: 204 mg/dL — AB (ref 70–99)
GLUCOSE-CAPILLARY: 236 mg/dL — AB (ref 70–99)
Glucose-Capillary: 109 mg/dL — ABNORMAL HIGH (ref 70–99)
Glucose-Capillary: 183 mg/dL — ABNORMAL HIGH (ref 70–99)

## 2013-11-19 LAB — HEPARIN LEVEL (UNFRACTIONATED): Heparin Unfractionated: <0.1 IU/mL — ABNORMAL LOW (ref 0.30–0.70)

## 2013-11-19 MED ORDER — CIPROFLOXACIN HCL 250 MG PO TABS: 250.0000 mg | ORAL_TABLET | Freq: Two times a day (BID) | ORAL | Status: DC

## 2013-11-19 MED ORDER — CEFTRIAXONE SODIUM 1 G IJ SOLR
1.0000 g | INTRAMUSCULAR | Status: DC
  Administered 2013-11-19 – 2013-11-20 (×2): 1 g via INTRAVENOUS
  Filled 2013-11-19 (×2): qty 10

## 2013-11-19 MED ORDER — REGADENOSON 0.4 MG/5ML IV SOLN
0.4000 mg | Freq: Once | INTRAVENOUS | Status: AC
  Administered 2013-11-20: 0.4 mg via INTRAVENOUS
  Filled 2013-11-19: qty 5

## 2013-11-19 MED ORDER — POTASSIUM CHLORIDE CRYS ER 20 MEQ PO TBCR
40.0000 meq | EXTENDED_RELEASE_TABLET | Freq: Two times a day (BID) | ORAL | Status: AC
  Administered 2013-11-19: 40 meq via ORAL
  Filled 2013-11-19 (×2): qty 2

## 2013-11-19 MED ORDER — HEPARIN BOLUS VIA INFUSION
1000.0000 [IU] | Freq: Once | INTRAVENOUS | Status: AC
  Administered 2013-11-19: 1000 [IU] via INTRAVENOUS
  Filled 2013-11-19: qty 1000

## 2013-11-19 NOTE — Progress Notes (Signed)
ANTICOAGULATION CONSULT NOTE - Follow Up Consult  Pharmacy Consult for heparin Indication: chest pain/ACS  Labs:  Recent Labs  11/16/13 0957  11/17/13 0605 11/17/13 1828 11/18/13 0410  11/18/13 1536 11/18/13 1754 11/18/13 1805 11/18/13 2148 11/19/13 0429 11/19/13 0543  HGB 12.6  --  11.1*  --   --   --   --   --   --   --   --  12.1  HCT 35.2*  --  31.6*  --   --   --   --   --   --   --   --  35.0*  PLT 206  --  147*  --   --   --   --   --   --   --   --  142*  HEPARINUNFRC  --   --   --   --   --   --   --   --  0.24*  --   --  <0.10*  CREATININE 0.68  < > 0.61 0.63 0.63  --   --  0.59  --   --   --   --   TROPONINI  --   --   --   --   --   < > 0.58*  --   --  0.45* <0.30  --   < > = values in this interval not displayed.   Assessment: 32yo female now undetectable on heparin despite rate increase last pm, no gtt issues per RN.  Goal of Therapy:  Heparin level 0.3-0.7 units/ml   Plan:  Will rebolus with heparin 1000 units and increase gtt by 4 units/kg/hr to 900 units/hr and check level in 6hr.  Vernard GamblesVeronda Ireoluwa Grant, PharmD, BCPS  11/19/2013,6:58 AM

## 2013-11-19 NOTE — Progress Notes (Signed)
    Consulting cardiologist: Dr. Olga MillersBrian Crenshaw  Subjective:    Chest sore when she swallows. No N/V.  Objective:   Temp:  [97.4 F (36.3 C)-98.8 F (37.1 C)] 97.9 F (36.6 C) (02/28 0814) Pulse Rate:  [50-88] 65 (02/28 0814) Resp:  [18-20] 18 (02/28 0814) BP: (98-115)/(46-68) 106/67 mmHg (02/28 0814) SpO2:  [98 %-100 %] 99 % (02/28 0814) Weight:  [114 lb 13.8 oz (52.1 kg)] 114 lb 13.8 oz (52.1 kg) (02/27 2110) Last BM Date: 11/17/13  Filed Weights   11/16/13 1220 11/18/13 2110  Weight: 110 lb 14.3 oz (50.3 kg) 114 lb 13.8 oz (52.1 kg)    Intake/Output Summary (Last 24 hours) at 11/19/13 0823 Last data filed at 11/19/13 0654  Gross per 24 hour  Intake    340 ml  Output   1000 ml  Net   -660 ml    Exam:  General: No distress.  Lungs: Clear, nonlabored.   Cardiac: RRR, no rub.  Extremities: No edema.   Lab Results:  Basic Metabolic Panel:  Recent Labs Lab 11/18/13 0410 11/18/13 1754 11/19/13 0543  NA 137 139 139  K 4.3 3.4* 3.5*  CL 102 101 98  CO2 21 24 22   GLUCOSE 250* 178* 184*  BUN 22 16 11   CREATININE 0.63 0.59 0.54  CALCIUM 8.7 8.4 8.7    CBC:  Recent Labs Lab 11/16/13 0957 11/17/13 0605 11/19/13 0543  WBC 7.5 4.4 5.1  HGB 12.6 11.1* 12.1  HCT 35.2* 31.6* 35.0*  MCV 90.3 92.1 92.6  PLT 206 147* 142*    Cardiac Enzymes:  Recent Labs Lab 11/18/13 1536 11/18/13 2148 11/19/13 0429  TROPONINI 0.58* 0.45* <0.30    BNP:  Recent Labs  01/08/13 1023  PROBNP 88.1    Echocardiogram (2/27): Study Conclusions  Left ventricle: The cavity size was normal. Wall thickness was normal. Systolic function was normal. The estimated ejection fraction was in the range of 50% to 55%. Wall motion was normal; there were no regional wall motion abnormalities. Left ventricular diastolic function parameters were normal.   Medications:   Scheduled Medications: . aspirin EC  81 mg Oral Daily  . insulin aspart  0-5 Units Subcutaneous QHS   . insulin aspart  0-9 Units Subcutaneous TID WC  . insulin aspart protamine- aspart  10 Units Subcutaneous BID WC  . pantoprazole  40 mg Oral Daily  . potassium chloride  40 mEq Oral BID     Infusions: . sodium chloride 19 mL/hr at 11/19/13 0059  . heparin 900 Units/hr (11/19/13 0759)     PRN Medications:  acetaminophen (TYLENOL) oral liquid 160 mg/5 mL, calcium carbonate, dextrose, gi cocktail, nitroGLYCERIN, phenol, sodium chloride   Assessment:   1. Elevated troponin I level, trend is not particularly consistent with ACS, third troponin I was completely normal. Echocardiogram from yesterday showed LVEF 50-55% without regional wall motion abnormalities.  2. Presentation with chest pain, improved with GI cocktail, drinking water and eating ice. GI etiology suspected, although there was a minor increase in troponin I initially as described above.  3. Acute renal failure in the setting of DKA, creatinine improved.  4. Tobacco abuse.   Plan/Discussion:    Reviewed Dr. Ludwig Clarksrenshaw's plan and discussed with patient. Will schedule inpatient Lexiscan Myoview for ischemic assessment. Can stop Heparin.    Jonelle SidleSamuel G. Kayson Bullis, M.D., F.A.C.C.

## 2013-11-19 NOTE — Progress Notes (Addendum)
ANTICOAGULATION CONSULT NOTE - Follow Up Consult  Pharmacy Consult for heparin Indication: chest pain/ACS  Labs:  Recent Labs  11/16/13 0957  11/17/13 0605  11/18/13 0410  11/18/13 1536 11/18/13 1754 11/18/13 1805 11/18/13 2148 11/19/13 0429 11/19/13 0543  HGB 12.6  --  11.1*  --   --   --   --   --   --   --   --  12.1  HCT 35.2*  --  31.6*  --   --   --   --   --   --   --   --  35.0*  PLT 206  --  147*  --   --   --   --   --   --   --   --  142*  HEPARINUNFRC  --   --   --   --   --   --   --   --  0.24*  --   --  <0.10*  CREATININE 0.68  < > 0.61  < > 0.63  --   --  0.59  --   --   --  0.54  TROPONINI  --   --   --   --   --   < > 0.58*  --   --  0.45* <0.30  --   < > = values in this interval not displayed.   Assessment: 33 yo F admitted 2/24 with lethargy and AMS. Found to be in DKA and started on insulin infusion. DKA has resolved and patient was transitioned to SQ insulin. Patient has reported a burning type of CP, not improved with SL NTG, no EKG changes, but Trop elevated at 0.6. Initiated on IV heparin, initial heparin level just below goal and undetectable on heparin despite rate increase. Not bleeding noted.  Per MD note: scheduleing inpatient Lexiscan Myoview for ischemic assessment, and stopping Heparin  Goal of Therapy:  Heparin level 0.3-0.7 units/ml   Plan:  -Stopped heparin   Anabel BeneSendra Vandy Fong, PharmD Clinical Pharmacist Resident Pager: [phone]  11/19/2013,9:33 AM

## 2013-11-19 NOTE — Progress Notes (Signed)
Patient refused to take her potassium pills. She stated that she doesn't like them. We were unable to educate or compromised at this time. We  Will inform MD.

## 2013-11-19 NOTE — Progress Notes (Signed)
C/O  Indigestion Maalox 30 ml administered as ordered. We will continue to monitor.

## 2013-11-19 NOTE — Progress Notes (Signed)
FMTS ATTENDING  NOTE Kristine Muns,MD I  have seen and examined this patient, reviewed their chart. I have discussed this patient with the resident. I agree with the resident's findings, assessment and care plan. 

## 2013-11-19 NOTE — Progress Notes (Signed)
Family Medicine Teaching Service Daily Progress Note Intern Pager: [phone]  Patient name: Kristine Allison Medical record number: [id number] Date of birth: [birth year] Age: 33 y.o. Gender: female  Primary Care Provider: Janit Pagan, MD Consultants: none Code Status: full  Pt Overview and Major Events to Date:  2/24 - Admitted in DKA 2/25 - Insulin drip stopped, lantus started 2/26 - Out of ICU 2/27 - elevated Troponin-I (0.60), start Heparin gtt 2/28 - improved Trop trend 0.60-->0.45-->negative, DC'd Heparin gtt, Cards --> Myoview in AM  Assessment and Plan: 33 y/o F, DM I, admitted on 2/24 with lethargy / AMS & weakness. Found to have DKA  # DKA, suspected secondary to non-adherence to insulin - Resolved noncompliance per family, gap 41 and pH 7.32 on admission - gap closed and transitioned to SQ insulin on 2/25 - home regimen: 70/30, 20u am, 15u pm, 20qhs + metformin - Continue 70/30 10u BID with meals - improved CBGs (100-230), 6u correction given - sensitive SSI  # Chest pain Reported to have started on 2/26, atypical, burning / related to throat, no typical features, not relieved by nitro, mostly relieved by GI cocktail - (2/27) EKG with T-wave inversions in I and aVL, troponin elevated to 0.60 --> started Heparin gtt (ACS) - Currently improved CP / endorses throat pain, related to eating - Continue GI cocktail (last 0500) with relief, ordered TID PRN - Ordered Troponin-I x 1 to confirm negative - ASA 81mg  daily, Nitro SL PRN - c/s Cardiology - re: concern ACS, elevated troponin - greatly appreciate recommendations      - Suspected GI etiology, atypical features, likely EKG changes attributed to lead placement      - Resolved troponin-I trend 0.60-->0.58-->0.45-->negative      - (2/27) ECHO EF 50-55%, otherwise nml      - DC'd Heparin gtt today      - ordered Lexiscan Myoview stress in AM, NPO after midnight  # UTI, uncomplicated Reporting dysuria today -  (2/27) repeat UA suggestive of UTI (nitrite neg, trace leuk, large hgb, WBC 7-10 / RBC 21-50, few squam, few bact) - empiric coverage Rocephin 1g q 24 hr, switch to PO pending sensitivities - f/u Urine culture (lab still had urine from yesterday, will culture)  # Hypokalemia, mild - K 3.5 - ordered K PO BID today - f/u BMET in AM  FEN/GI: Carb modified diet (NPO after MN), SLIV PPx: SQ heparin  Disposition: Admitted to ICU, transferred to FPTS floor status on 2/27, pending further work-up and clinical improvement, Cardiology following with ordered Myoview stress test on 11/20/13, if improved expect to discharge home tomorrow  Subjective:  No acute events overnight. This morning states feeling better, still c/o throat / central CP x 2 days, improved from yesterday, but still feels like a "golfball" in throat. Improved with last GI cocktail (0500), requesting another Gi cocktail prior to breakfast (not interested in clear / full liquid diet). Denies abdominal pain, nausea / vomiting (last vomiting episode was several days ago). Already seen by Cardiology, she is agreeable to stress test tomorrow. No other concerns.  Objective: Temp:  [97.4 F (36.3 C)-98.8 F (37.1 C)] 97.9 F (36.6 C) (02/28 0814) Pulse Rate:  [50-88] 65 (02/28 0814) Resp:  [18-20] 18 (02/28 0814) BP: (98-115)/(50-68) 106/67 mmHg (02/28 0814) SpO2:  [98 %-100 %] 99 % (02/28 0814) Weight:  [114 lb 13.8 oz (52.1 kg)] 114 lb 13.8 oz (52.1 kg) (02/27 2110) Physical Exam: General: well-appearing, NAD  HEENT:  improved MMM Cardiovascular: RRR, no murmur appreciated  Lungs: CTAB, no wheezing, crackles, or rhonchi. Normal WOB Abdomen: soft, NTND, no guarding / no rebound, no masses, +active BS  Skin: Warm/dry, no edema Neuro: awake, alert, oriented, grossly non-focal  Laboratory:  Recent Labs Lab 11/16/13 0957 11/17/13 0605 11/19/13 0543  WBC 7.5 4.4 5.1  HGB 12.6 11.1* 12.1  HCT 35.2* 31.6* 35.0*  PLT 206  147* 142*    Recent Labs Lab 11/18/13 0410 11/18/13 1754 11/19/13 0543  NA 137 139 139  K 4.3 3.4* 3.5*  CL 102 101 98  CO2 21 24 22   BUN 22 16 11   CREATININE 0.63 0.59 0.54  CALCIUM 8.7 8.4 8.7  GLUCOSE 250* 178* 184*   HgbA1c - 13.2 Procalcitonin 0.17 Troponin-I: (2/24-2/25 initial negative x 3), (2/27) elevated 0.60-->0.58-->0.45-->negative  UA (2/24) - ketone >80, nitrite neg, leuk neg, WBC 0-2, small hgb Urine Culture (2/24) - No growth  Repeat UA (2/27) - ketone 40, nitrite neg, trace leuk, large hgb, WBC 7-10 / RBC 21-50, few squam, few bacteria Repeat Urine Culture (2/27) - (pending)  ABG    Component Value Date/Time   PHART 7.320* 11/15/2013 2258   PCO2ART 35.7 11/15/2013 2258   PO2ART 68.0* 11/15/2013 2258   HCO3 18.2* 11/15/2013 2258   TCO2 19 11/15/2013 2258   ACIDBASEDEF 7.0* 11/15/2013 2258   O2SAT 91.0 11/15/2013 2258   Imaging/Diagnostic Tests: CXR: Cardiomediastinal silhouette is stable. No acute infiltrate or pleural effusion. No pulmonary edema. Bony thorax is unremarkable.  Saralyn PilarAlexander Zadiel Leyh, DO 11/19/2013, 11:02 AM PGY-1, [facility] FPTS Intern pager: [phone], text pages welcome

## 2013-11-20 ENCOUNTER — Inpatient Hospital Stay (HOSPITAL_COMMUNITY): Payer: MEDICAID

## 2013-11-20 ENCOUNTER — Inpatient Hospital Stay (HOSPITAL_COMMUNITY): Payer: Self-pay

## 2013-11-20 LAB — BASIC METABOLIC PANEL
BUN: 11 mg/dL (ref 6–23)
CALCIUM: 8.5 mg/dL (ref 8.4–10.5)
CO2: 21 mEq/L (ref 19–32)
CREATININE: 0.53 mg/dL (ref 0.50–1.10)
Chloride: 96 mEq/L (ref 96–112)
GFR calc Af Amer: >90 mL/min (ref >90)
GFR calc non Af Amer: >90 mL/min (ref >90)
Glucose, Bld: 241 mg/dL — ABNORMAL HIGH (ref 70–99)
Potassium: 4 mEq/L (ref 3.7–5.3)
Sodium: 136 mEq/L — ABNORMAL LOW (ref 137–147)

## 2013-11-20 LAB — URINE CULTURE
COLONY COUNT: NO GROWTH
Culture: NO GROWTH

## 2013-11-20 LAB — GLUCOSE, CAPILLARY
Glucose-Capillary: 210 mg/dL — ABNORMAL HIGH (ref 70–99)
Glucose-Capillary: 251 mg/dL — ABNORMAL HIGH (ref 70–99)

## 2013-11-20 MED ORDER — INSULIN NPH ISOPHANE & REGULAR (70-30) 100 UNIT/ML ~~LOC~~ SUSP: 15.0000 [IU] | Freq: Three times a day (TID) | SUBCUTANEOUS | Status: DC

## 2013-11-20 MED ORDER — REGADENOSON 0.4 MG/5ML IV SOLN
INTRAVENOUS | Status: AC
  Administered 2013-11-20: 0.4 mg via INTRAVENOUS
  Filled 2013-11-20: qty 5

## 2013-11-20 MED ORDER — ASPIRIN 81 MG PO TBEC: 81.0000 mg | DELAYED_RELEASE_TABLET | Freq: Every day | ORAL | Status: DC

## 2013-11-20 MED ORDER — TECHNETIUM TC 99M SESTAMIBI - CARDIOLITE
30.0000 | Freq: Once | INTRAVENOUS | Status: AC | PRN
  Administered 2013-11-20: 11:00:00 30 via INTRAVENOUS

## 2013-11-20 MED ORDER — TECHNETIUM TC 99M SESTAMIBI - CARDIOLITE
10.0000 | Freq: Once | INTRAVENOUS | Status: AC | PRN
  Administered 2013-11-20: 09:00:00 10 via INTRAVENOUS

## 2013-11-20 MED ORDER — CEPHALEXIN 500 MG PO CAPS: 500.0000 mg | ORAL_CAPSULE | Freq: Three times a day (TID) | ORAL | Status: DC

## 2013-11-20 NOTE — Discharge Instructions (Addendum)
You were admitted for DKA (Diabetic Ketoacidosis) from not taking your insulin. While in the hospital it appeared that you may have had some minor damage to your heart (not necessarily a "heart attack") for which we had a cardiologist see you, and the stress test was normal. We also started treatment for a urinary tract infection (UTI), for which you will take 7 more days of antibiotics  It is very important to take your insulin as prescribed. We have changed your insulin regimen to 20 units in the morning, 10 units in evening, 10 units at night. Please record your blood sugars and bring them to your follow up visits, as this may need to be increased back to what it was before.

## 2013-11-20 NOTE — Progress Notes (Signed)
Family Medicine Teaching Service Daily Progress Note Intern Pager: [phone]  Patient name: Kristine Allison Medical record number: [id number] Date of birth: 06/23/1981 Age: 33 y.o. Gender: female  Primary Care Provider: Janit Pagan, MD Consultants: none Code Status: full  Pt Overview and Major Events to Date:  2/24 - Admitted in DKA 2/25 - Insulin drip stopped, lantus started 2/26 - Out of ICU 2/27 - elevated Troponin-I (0.60), start Heparin gtt 2/28 - improved Trop trend 0.60-->0.45-->negative, DC'd Heparin gtt, Cards --> Myoview in AM 3/1- Myoview normal  Assessment and Plan: 33 y/o F, DM I, admitted on 2/24 with lethargy / AMS & weakness. Found to have DKA  # DKA, suspected secondary to non-adherence to insulin - Resolved noncompliance per family, gap 41 and pH 7.32 on admission - gap closed and transitioned to SQ insulin on 2/25 - home regimen: 70/30, 20u am, 15u pm, 20qhs + metformin changed to 70/30 20 am, 10 dinner, 10 before bed - improved CBGs (182-236), 5u correction given - sensitive SSI  # Chest pain Reported to have started on 2/26, atypical, burning / related to throat, no typical features, not relieved by nitro, mostly relieved by GI cocktail - (2/27) EKG with T-wave inversions in I and aVL, troponin elevated to 0.60 --> started Heparin gtt (ACS) - Currently improved CP / endorses throat pain, related to eating - Continue GI cocktail with relief, ordered TID PRN  - ASA 81mg  daily, Nitro SL PRN - c/s Cardiology - re: concern ACS, elevated troponin - greatly appreciate recommendations  - Suspected GI etiology, atypical features, likely EKG changes attributed to lead placement  - Resolved troponin-I trend 0.60-->0.58-->0.45-->negative  - (2/27) ECHO EF 50-55%, otherwise nml  -myoview normal  - unwilling to call true NSTEMI, continue ASA only  # UTI, uncomplicated Reporting dysuria today - (2/27) repeat UA suggestive of UTI (nitrite neg, trace leuk, large  hgb, WBC 7-10 / RBC 21-50, few squam, few bact) - empiric coverage Rocephin 1g q 24 hr, switch to PO keflex, Urine culture pending  # Hypokalemia, mild, resolved - K 4.0 - follow up in clinic  FEN/GI: Carb modified diet (NPO after MN), SLIV PPx: SQ heparin  Disposition: pending stress myoview today  Subjective:  Feeling well today, continued throat pain with eating. No more chest pain.   Objective: Temp:  [97.9 F (36.6 C)-98.6 F (37 C)] 98.6 F (37 C) (03/01 1211) Pulse Rate:  [57-110] 82 (03/01 1211) Resp:  [16-18] 18 (03/01 1211) BP: (106-125)/(42-70) 110/63 mmHg (03/01 1211) SpO2:  [98 %-100 %] 100 % (03/01 1211)  Physical Exam: General: well-appearing, NAD  HEENT: NCAT Cardiovascular: RRR, no murmur appreciated  Lungs: CTAB, no wheezing, crackles, or rhonchi. Normal WOB Abdomen: soft, NTND, no guarding / no rebound, no masses, +active BS  Skin: Warm/dry, no edema Neuro: awake, alert, oriented, grossly non-focal  Laboratory:  Recent Labs Lab 11/16/13 0957 11/17/13 0605 11/19/13 0543  WBC 7.5 4.4 5.1  HGB 12.6 11.1* 12.1  HCT 35.2* 31.6* 35.0*  PLT 206 147* 142*    Recent Labs Lab 11/18/13 1754 11/19/13 0543 11/20/13 0645  NA 139 139 136*  K 3.4* 3.5* 4.0  CL 101 98 96  CO2 24 22 21   BUN 16 11 11   CREATININE 0.59 0.54 0.53  CALCIUM 8.4 8.7 8.5  GLUCOSE 178* 184* 241*   HgbA1c - 13.2 Procalcitonin 0.17 Troponin-I: (2/24-2/25 initial negative x 3), (2/27) elevated 0.60-->0.58-->0.45-->negative  UA (2/24) - ketone >80, nitrite neg, leuk neg, WBC  0-2, small hgb Urine Culture (2/24) - No growth  Repeat UA (2/27) - ketone 40, nitrite neg, trace leuk, large hgb, WBC 7-10 / RBC 21-50, few squam, few bacteria Repeat Urine Culture (2/27) - (pending)  ABG    Component Value Date/Time   PHART 7.320* 11/15/2013 2258   PCO2ART 35.7 11/15/2013 2258   PO2ART 68.0* 11/15/2013 2258   HCO3 18.2* 11/15/2013 2258   TCO2 19 11/15/2013 2258   ACIDBASEDEF 7.0*  11/15/2013 2258   O2SAT 91.0 11/15/2013 2258   Imaging/Diagnostic Tests: CXR: Cardiomediastinal silhouette is stable. No acute infiltrate or pleural effusion. No pulmonary edema. Bony thorax is unremarkable.  Kristine GammaSamuel L Bradshaw, MD 11/20/2013, 12:37 PM PGY-1, [facility] FPTS Intern pager: [phone], text pages welcome

## 2013-11-20 NOTE — Progress Notes (Signed)
Patient remain npo for Stress Test today.

## 2013-11-20 NOTE — Progress Notes (Signed)
FMTS ATTENDING  NOTE Daiya Tamer,MD I  have seen and examined this patient, reviewed their chart. I have discussed this patient with the resident. I agree with the resident's findings, assessment and care plan. 

## 2013-11-20 NOTE — Discharge Summary (Signed)
FMTS ATTENDING  NOTE Odalys Win,MD I  have seen and examined this patient, reviewed their chart. I have discussed this patient with the resident. I agree with the resident's findings, assessment and care plan. 

## 2013-11-20 NOTE — Progress Notes (Signed)
   Consulting cardiologist: Dr. Olga MillersBrian Crenshaw  Subjective:  No chest pain  Objective:  Vital Signs in the last 24 hours: Temp:  [97.9 F (36.6 C)-98.5 F (36.9 C)] 97.9 F (36.6 C) (03/01 0815) Pulse Rate:  [57-101] 101 (03/01 1019) Resp:  [16-19] 18 (03/01 0815) BP: (102-125)/(42-70) 106/42 mmHg (03/01 1019) SpO2:  [98 %-100 %] 100 % (03/01 0815)  Intake/Output from previous day:  Intake/Output Summary (Last 24 hours) at 11/20/13 1019 Last data filed at 11/19/13 1828  Gross per 24 hour  Intake    125 ml  Output      0 ml  Net    125 ml    Physical Exam: General appearance: alert, cooperative and no distress Lungs: clear to auscultation bilaterally Heart: regular rate and rhythm  Rate: 70 Rhythm: normal sinus rhythm  Lab Results:  Recent Labs  11/19/13 0543  WBC 5.1  HGB 12.1  PLT 142*    Recent Labs  11/19/13 0543 11/20/13 0645  NA 139 136*  K 3.5* 4.0  CL 98 96  CO2 22 21  GLUCOSE 184* 241*  BUN 11 11  CREATININE 0.54 0.53    Recent Labs  11/19/13 0429 11/19/13 1115  TROPONINI <0.30 <0.30   No results found for this basename: INR,  in the last 72 hours  Imaging: Imaging results have been reviewed  Cardiac Studies:  Assessment/Plan:   Principal Problem:   DKA (diabetic ketoacidoses) Active Problems:   Diabetes mellitus   Chest pain   Elevated troponin I level   Noncompliance with medication regimen   GERD (gastroesophageal reflux disease)   PLAN: Lexiscan Myoview.  Corine ShelterLuke Kilroy PA-C [facility] [phone] 11/20/2013, 10:19 AM   Attending note:  Patient without chest pain, off Heparin. See note from yesterday for details. For Myoview today as planned.  Jonelle SidleSamuel G. McDowell, M.D., F.A.C.C.

## 2013-11-28 ENCOUNTER — Encounter: Payer: Self-pay | Admitting: Family Medicine

## 2013-11-28 ENCOUNTER — Ambulatory Visit (INDEPENDENT_AMBULATORY_CARE_PROVIDER_SITE_OTHER): Payer: Self-pay | Admitting: Family Medicine

## 2013-11-28 VITALS — BP 112/69 | HR 111 | Ht 64.0 in | Wt 113.5 lb

## 2013-11-28 DIAGNOSIS — R079 Chest pain, unspecified: Secondary | ICD-10-CM

## 2013-11-28 DIAGNOSIS — E119 Type 2 diabetes mellitus without complications: Secondary | ICD-10-CM

## 2013-11-28 DIAGNOSIS — K219 Gastro-esophageal reflux disease without esophagitis: Secondary | ICD-10-CM

## 2013-11-28 MED ORDER — METFORMIN HCL ER 500 MG PO TB24: 1000.0000 mg | ORAL_TABLET | Freq: Every day | ORAL | Status: DC

## 2013-11-28 NOTE — Patient Instructions (Addendum)
Dear Kristine Allison, Thank you for coming in to clinic today. It was good to see you!  Today we discussed your Diabetes. 1. It sounds like your blood sugars have been better controlled since leaving the hospital 2. Continue with the new Novolin insulin plan: 10u in morning, 15u afternoon, 10u evening 3. Continue to check your blood sugar twice daily, and write down your readings, bring these with you to your next visit with Dr. Lum BabeEniola. We will need to look at these numbers and possibly make an adjustment 4. Recommend trying Ibuprofen for your sore throat and headache.  Please schedule a follow-up appointment with Dr. Lum BabeEniola in 2-3 months for Diabetes follow-up   If you have any other questions or concerns, please feel free to call the clinic to contact me. You may also schedule an earlier appointment if necessary.  However, if your symptoms get significantly worse, please go to the Emergency Department to seek immediate medical attention.  Saralyn PilarAlexander Graceson Nichelson, DO [facility]

## 2013-11-28 NOTE — Assessment & Plan Note (Signed)
Poorly controlled. Last HgbA1c 13.2 (11/18/13) - H/o insulin non-adherence, likely trigger for recent DKA-hospitalization  Plan: 1. Continue recently changed NPH-Novolin schedule of 10u brkfst / 15u lunch / 10u dinner (changed from 20u AM / 15u PM) 2. Refilled Metformin-24hr 1000mg  daily 3. Check CBG x2 daily, bring glucose record in to next visit 4. Emphasized critical importance of taking her insulin / meds and monitoring CBGs to help prevent hospitalization 5. RTC 3 months for HgbA1c and DM f/u

## 2013-11-28 NOTE — Progress Notes (Addendum)
Subjective:     Patient ID: Kristine ElliotMargie E Simmons-Cohn, female   DOB: Jun 26, 1981, 33 y.o.   MRN: [id number]  Patient presents for a hospital follow-up appointment.  HPI  Recent hospitalization 2/24 to 11/20/13 for DKA, suspected due to insulin non-adherence.  CHRONIC DM, Type 2: Last HgbA1c 13.2 (11/18/13) - during hospitalization. Reports no concerns today. CBGs: Avg 180s, Low 150, High 200. Checks CBGs 2x daily Meds: Metformin-24hr 1000mg  daily, NPH-Novolin 70/30 (10u at breakfast, 15u at lunch, 10u at dinner) - Changed from prior 20u in AM and 15u in PM. Reports good compliance since discharged from hospital. Denies any side-effects Currently not on ACEi / ARB Denies hypoglycemia, polyuria, visual changes, numbness or tingling.  CHEST PAIN (Resolved) - Experienced chest pain during hospitalization, noted to have had elevated troponin-I to 0.60 and EKG changes with T-wave inversions - Cardiology evaluated and performed Myoview stress test (normal) - No active chest pain today, no other concerns - Denies CP, SOB  DYSPHAGIA / h/o GERD: - Reported recent h/o since hospitalization with some throat pain on swallowing solids, denies any concern with liquids - Gradual improvement, continues taking Cepacol - Prior h/o GERD, treated with Protonix, but can't afford anymore  I have reviewed and updated the following as appropriate: allergies and current medications   Social Hx: Active smoker 0.5ppd  Review of Systems  See above HPI, additionally: Admits mild HA     Objective:   Physical Exam  BP 112/69  Pulse 111  Ht 5\' 4"  (1.626 m)  Wt 113 lb 8 oz (51.483 kg)  BMI 19.47 kg/m2  LMP 10/23/2013  Gen - thin, appears older than stated age, NAD HEENT - PERRL, oropharynx clear, MMM Neck - supple, non-tender, no LAD, no thyromegaly Heart - RRR, no murmurs heard, chest-wall non-tender to palpation Lungs - Mostly CTAB with occasional scattered rhonchi. Normal work of breathing. Skin -  warm, dry Abd - soft, NTND, no masses, +active BS Neuro - awake, alert, oriented     Assessment:     See specific A&P problem list for details.      Plan:     See specific A&P problem list for details.

## 2013-11-28 NOTE — Assessment & Plan Note (Signed)
Suspect patient's recent CP and current sore throat could be related to GERD, currently untreated.  Plan: 1. Patient declined offer to prescribe PPI 2. Discussed OTC antacid medications for her to try, recommended Prilosec, alternatively Zantac 3. RTC if persistent symptoms

## 2013-12-01 ENCOUNTER — Encounter (HOSPITAL_COMMUNITY): Payer: Self-pay | Admitting: Emergency Medicine

## 2013-12-01 ENCOUNTER — Inpatient Hospital Stay (HOSPITAL_COMMUNITY)
Admission: EM | Admit: 2013-12-01 | Discharge: 2013-12-03 | DRG: 638 | Disposition: A | Discharge status: Home / Self Care | Payer: Self-pay | Attending: Family Medicine | Admitting: Family Medicine

## 2013-12-01 ENCOUNTER — Emergency Department (INDEPENDENT_AMBULATORY_CARE_PROVIDER_SITE_OTHER)
Admission: EM | Admit: 2013-12-01 | Discharge: 2013-12-01 | Disposition: A | Discharge status: Nursing Home (Includes ICF) | Payer: Self-pay | Source: Home / Self Care

## 2013-12-01 ENCOUNTER — Emergency Department (HOSPITAL_COMMUNITY): Payer: Self-pay

## 2013-12-01 DIAGNOSIS — Z9119 Patient's noncompliance with other medical treatment and regimen: Secondary | ICD-10-CM

## 2013-12-01 DIAGNOSIS — E860 Dehydration: Secondary | ICD-10-CM

## 2013-12-01 DIAGNOSIS — E872 Acidosis, unspecified: Secondary | ICD-10-CM

## 2013-12-01 DIAGNOSIS — Z79899 Other long term (current) drug therapy: Secondary | ICD-10-CM

## 2013-12-01 DIAGNOSIS — E119 Type 2 diabetes mellitus without complications: Secondary | ICD-10-CM

## 2013-12-01 DIAGNOSIS — I959 Hypotension, unspecified: Secondary | ICD-10-CM | POA: Diagnosis present

## 2013-12-01 DIAGNOSIS — I214 Non-ST elevation (NSTEMI) myocardial infarction: Secondary | ICD-10-CM | POA: Diagnosis present

## 2013-12-01 DIAGNOSIS — E1010 Type 1 diabetes mellitus with ketoacidosis without coma: Principal | ICD-10-CM | POA: Diagnosis present

## 2013-12-01 DIAGNOSIS — Z7982 Long term (current) use of aspirin: Secondary | ICD-10-CM

## 2013-12-01 DIAGNOSIS — E1110 Type 2 diabetes mellitus with ketoacidosis without coma: Secondary | ICD-10-CM | POA: Diagnosis present

## 2013-12-01 DIAGNOSIS — R5383 Other fatigue: Secondary | ICD-10-CM

## 2013-12-01 DIAGNOSIS — K219 Gastro-esophageal reflux disease without esophagitis: Secondary | ICD-10-CM

## 2013-12-01 DIAGNOSIS — E871 Hypo-osmolality and hyponatremia: Secondary | ICD-10-CM | POA: Diagnosis present

## 2013-12-01 DIAGNOSIS — Z794 Long term (current) use of insulin: Secondary | ICD-10-CM

## 2013-12-01 DIAGNOSIS — R5381 Other malaise: Secondary | ICD-10-CM

## 2013-12-01 DIAGNOSIS — Z91199 Patient's noncompliance with other medical treatment and regimen due to unspecified reason: Secondary | ICD-10-CM

## 2013-12-01 DIAGNOSIS — N390 Urinary tract infection, site not specified: Secondary | ICD-10-CM | POA: Diagnosis present

## 2013-12-01 DIAGNOSIS — E876 Hypokalemia: Secondary | ICD-10-CM | POA: Diagnosis not present

## 2013-12-01 DIAGNOSIS — B3781 Candidal esophagitis: Secondary | ICD-10-CM | POA: Diagnosis present

## 2013-12-01 DIAGNOSIS — F17200 Nicotine dependence, unspecified, uncomplicated: Secondary | ICD-10-CM | POA: Diagnosis present

## 2013-12-01 LAB — I-STAT CHEM 8, ED
BUN: 28 mg/dL — ABNORMAL HIGH (ref 6–23)
CALCIUM ION: 1.1 mmol/L — AB (ref 1.12–1.23)
CREATININE: 0.7 mg/dL (ref 0.50–1.10)
Chloride: 101 mEq/L (ref 96–112)
GLUCOSE: 313 mg/dL — AB (ref 70–99)
HCT: 51 % — ABNORMAL HIGH (ref 36.0–46.0)
Hemoglobin: 17.3 g/dL — ABNORMAL HIGH (ref 12.0–15.0)
Potassium: 5.2 mEq/L (ref 3.7–5.3)
Sodium: 128 mEq/L — ABNORMAL LOW (ref 137–147)
TCO2: 15 mmol/L (ref 0–100)

## 2013-12-01 LAB — CBC WITH DIFFERENTIAL/PLATELET
Basophils Absolute: 0 10*3/uL (ref 0.0–0.1)
Basophils Relative: 0 % (ref 0–1)
EOS ABS: 0 10*3/uL (ref 0.0–0.7)
EOS PCT: 0 % (ref 0–5)
HCT: 46.9 % — ABNORMAL HIGH (ref 36.0–46.0)
HEMOGLOBIN: 16.5 g/dL — AB (ref 12.0–15.0)
Lymphocytes Relative: 15 % (ref 12–46)
Lymphs Abs: 0.9 10*3/uL (ref 0.7–4.0)
MCH: 32.9 pg (ref 26.0–34.0)
MCHC: 35.2 g/dL (ref 30.0–36.0)
MCV: 93.4 fL (ref 78.0–100.0)
MONO ABS: 0.2 10*3/uL (ref 0.1–1.0)
Monocytes Relative: 4 % (ref 3–12)
Neutro Abs: 4.9 10*3/uL (ref 1.7–7.7)
Neutrophils Relative %: 81 % — ABNORMAL HIGH (ref 43–77)
PLATELETS: 227 10*3/uL (ref 150–400)
RBC: 5.02 MIL/uL (ref 3.87–5.11)
RDW: 14.7 % (ref 11.5–15.5)
WBC: 6 10*3/uL (ref 4.0–10.5)

## 2013-12-01 LAB — BASIC METABOLIC PANEL
BUN: 19 mg/dL (ref 6–23)
CALCIUM: 8.9 mg/dL (ref 8.4–10.5)
CO2: 10 meq/L — AB (ref 19–32)
Chloride: 89 mEq/L — ABNORMAL LOW (ref 96–112)
Creatinine, Ser: 0.67 mg/dL (ref 0.50–1.10)
GFR calc Af Amer: >90 mL/min (ref >90)
GFR calc non Af Amer: >90 mL/min (ref >90)
GLUCOSE: 322 mg/dL — AB (ref 70–99)
Potassium: 5.3 mEq/L (ref 3.7–5.3)
Sodium: 131 mEq/L — ABNORMAL LOW (ref 137–147)

## 2013-12-01 LAB — GLUCOSE, CAPILLARY: Glucose-Capillary: 354 mg/dL — ABNORMAL HIGH (ref 70–99)

## 2013-12-01 LAB — POC URINE PREG, ED: Preg Test, Ur: NEGATIVE

## 2013-12-01 LAB — I-STAT CG4 LACTIC ACID, ED: Lactic Acid, Venous: 2.95 mmol/L — ABNORMAL HIGH (ref 0.5–2.2)

## 2013-12-01 MED ORDER — SODIUM CHLORIDE 0.9 % IV SOLN
Freq: Once | INTRAVENOUS | Status: AC
  Administered 2013-12-01: 20:00:00 via INTRAVENOUS

## 2013-12-01 MED ORDER — SODIUM CHLORIDE 0.9 % IV BOLUS (SEPSIS)
1000.0000 mL | Freq: Once | INTRAVENOUS | Status: AC
  Administered 2013-12-01: 1000 mL via INTRAVENOUS

## 2013-12-01 MED ORDER — SODIUM CHLORIDE 0.9 % IV BOLUS (SEPSIS)
500.0000 mL | Freq: Once | INTRAVENOUS | Status: AC
  Administered 2013-12-01: 500 mL via INTRAVENOUS

## 2013-12-01 MED ORDER — MORPHINE SULFATE 4 MG/ML IJ SOLN
4.0000 mg | Freq: Once | INTRAMUSCULAR | Status: AC
  Administered 2013-12-01: 4 mg via INTRAVENOUS
  Filled 2013-12-01: qty 1

## 2013-12-01 MED ORDER — IOHEXOL 300 MG/ML  SOLN
75.0000 mL | Freq: Once | INTRAMUSCULAR | Status: AC | PRN
  Administered 2013-12-01: 75 mL via INTRAVENOUS

## 2013-12-01 MED ORDER — SODIUM CHLORIDE 0.9 % IV SOLN
INTRAVENOUS | Status: DC
  Administered 2013-12-02: 2.3 [IU]/h via INTRAVENOUS
  Filled 2013-12-01: qty 1

## 2013-12-01 MED ORDER — POTASSIUM CHLORIDE IN NACL 20-0.45 MEQ/L-% IV SOLN
INTRAVENOUS | Status: DC
  Administered 2013-12-02: 01:00:00 via INTRAVENOUS
  Filled 2013-12-01 (×3): qty 1000

## 2013-12-01 NOTE — ED Notes (Signed)
Pt [facility] transfer for hypotension (74/53) and tachycardia (136) - [facility]

## 2013-12-01 NOTE — ED Provider Notes (Signed)
CSN: [id number]     Arrival date & time 12/01/13  2021 History   First MD Initiated Contact with Patient 12/01/13 2027     Chief complaint: low blood pressure   HPI Kristine Allison is a 33 y.o. female with DM 1 who presents with low blood pressure. She initially presented to an urgent care clinic complaining of sore throat.  Her sore throat has been present for 1 week. It hurts worse when she swallows or tries to eat.  It is moderate in severity.  Relieved by nothing.  No treatments tried.    At urgent care she was noted to be hypotensive to 74/51 and tachycardic to 136.  She was immediately transferred here.  She denies fever, chills, headache, chest pain, nausea, vomiting, diarrhea, rash, or other symptoms.  On arrival to the ED, she is normotensive with BP in low 100s, HR 90s, afebrile.     Past Medical History  Diagnosis Date  . Asthma   . Diabetes mellitus without complication   . NWGNFAOZ(308.6)    Past Surgical History  Procedure Laterality Date  . No past surgeries     Family History  Problem Relation Age of Onset  . Heart disease      No family history   History  Substance Use Topics  . Smoking status: Current Every Day Smoker -- 0.40 packs/day for 20 years    Types: Cigarettes  . Smokeless tobacco: Current User     Comment: only smoking electronic cigs  . Alcohol Use: No   OB History   Grav Para Term Preterm Abortions TAB SAB Ect Mult Living                 Review of Systems  Constitutional: Negative for fever, chills and diaphoresis.  HENT: Positive for sore throat. Negative for congestion and rhinorrhea.   Respiratory: Negative for cough, shortness of breath and wheezing.   Cardiovascular: Negative for chest pain and leg swelling.  Gastrointestinal: Negative for nausea, vomiting, abdominal pain and diarrhea.  Genitourinary: Negative for dysuria, urgency, frequency, flank pain, vaginal bleeding, vaginal discharge and difficulty urinating.   Musculoskeletal: Negative for neck pain and neck stiffness.  Skin: Negative for rash.  Neurological: Negative for weakness, numbness and headaches.  All other systems reviewed and are negative.      Allergies  Latex  Home Medications   Current Outpatient Rx  Name  Route  Sig  Dispense  Refill  . albuterol (PROAIR HFA) 108 (90 BASE) MCG/ACT inhaler   Inhalation   Inhale 2 puffs into the lungs 2 (two) times daily as needed for wheezing or shortness of breath.         Marland Kitchen aspirin EC 81 MG EC tablet   Oral   Take 1 tablet (81 mg total) by mouth daily.   30 tablet   11   . cephALEXin (KEFLEX) 500 MG capsule   Oral   Take 1 capsule (500 mg total) by mouth 3 (three) times daily.   21 capsule   0   . ibuprofen (ADVIL,MOTRIN) 200 MG tablet   Oral   Take 400 mg by mouth every 6 (six) hours as needed for moderate pain.         Marland Kitchen insulin NPH-regular Human (NOVOLIN 70/30) (70-30) 100 UNIT/ML injection   Subcutaneous   Inject 15-20 Units into the skin 3 (three) times daily with meals. 10 units at breakfast, 15 units at lunch, and 10 units at dinner         .  menthol-cetylpyridinium (CEPACOL) 3 MG lozenge   Oral   Take 1 lozenge by mouth as needed for sore throat.         . metFORMIN (GLUCOPHAGE-XR) 500 MG 24 hr tablet   Oral   Take 2 tablets (1,000 mg total) by mouth daily with breakfast.   60 tablet   4   . phenol (CHLORASEPTIC) 1.4 % LIQD   Mouth/Throat   Use as directed 1 spray in the mouth or throat as needed for throat irritation / pain.         . ranitidine (ZANTAC) 150 MG tablet   Oral   Take 150 mg by mouth 2 (two) times daily.          BP 93/55  Temp(Src) 97.9 F (36.6 C) (Oral)  Resp 13  LMP 10/23/2013 Physical Exam  Nursing note and vitals reviewed. Constitutional: She is oriented to person, place, and time. She appears well-developed and well-nourished. No distress.  HENT:  Head: Normocephalic and atraumatic.  Dry mucus membranes  Eyes:  Conjunctivae and EOM are normal. Pupils are equal, round, and reactive to light. No scleral icterus.  Neck: Normal range of motion. Neck supple. No JVD present. No tracheal deviation present.  Mild pain with hyperextension of the neck.  Negative Kernig and Brudzinski.  Cardiovascular: Normal rate, regular rhythm, normal heart sounds and intact distal pulses.  Exam reveals no gallop and no friction rub.   No murmur heard. Pulmonary/Chest: Effort normal and breath sounds normal. No stridor. No respiratory distress. She has no wheezes. She has no rales.  Abdominal: Soft. Bowel sounds are normal. She exhibits no distension. There is no tenderness. There is no rebound and no guarding.  Musculoskeletal: She exhibits no edema.  Lymphadenopathy:    She has no cervical adenopathy.  Neurological: She is alert and oriented to person, place, and time. No cranial nerve deficit. She exhibits normal muscle tone. Coordination normal.  Skin: Skin is warm and dry. She is not diaphoretic.    ED Course  Procedures (including critical care time) Labs Review Labs Reviewed  BASIC METABOLIC PANEL - Abnormal; Notable for the following:    Sodium 131 (*)    Chloride 89 (*)    CO2 10 (*)    Glucose, Bld 322 (*)    All other components within normal limits  CBC WITH DIFFERENTIAL - Abnormal; Notable for the following:    Hemoglobin 16.5 (*)    HCT 46.9 (*)    Neutrophils Relative % 81 (*)    All other components within normal limits  I-STAT CHEM 8, ED - Abnormal; Notable for the following:    Sodium 128 (*)    BUN 28 (*)    Glucose, Bld 313 (*)    Calcium, Ion 1.10 (*)    Hemoglobin 17.3 (*)    HCT 51.0 (*)    All other components within normal limits  I-STAT CG4 LACTIC ACID, ED - Abnormal; Notable for the following:    Lactic Acid, Venous 2.95 (*)    All other components within normal limits  CULTURE, BLOOD (ROUTINE X 2)  CULTURE, BLOOD (ROUTINE X 2)  BASIC METABOLIC PANEL  BASIC METABOLIC PANEL   BASIC METABOLIC PANEL  BASIC METABOLIC PANEL  BASIC METABOLIC PANEL  BASIC METABOLIC PANEL  BASIC METABOLIC PANEL  BASIC METABOLIC PANEL  BASIC METABOLIC PANEL  BASIC METABOLIC PANEL  BASIC METABOLIC PANEL  BASIC METABOLIC PANEL  POC URINE PREG, ED  CBG MONITORING, ED   Imaging  Review Ct Soft Tissue Neck W Contrast  12/01/2013   CLINICAL DATA:  Sore throat  EXAM: CT NECK WITH CONTRAST  TECHNIQUE: Multidetector CT imaging of the neck was performed using the standard protocol following the bolus administration of intravenous contrast.  CONTRAST:  75mL OMNIPAQUE IOHEXOL 300 MG/ML  SOLN  COMPARISON:  Prior radiograph from 08/24/2011  FINDINGS: The visualized portions of the brain are within normal limits. Orbits are not visualized. Mastoid air cells are clear.  Paranasal sinuses are clear.  The salivary glands including the parotid glands and submandibular glands are normal in appearance and symmetric bilaterally.  The oral cavity is within normal limits without evidence of mass lesion or loculated fluid collection. Submental region is not included on this examination. Palatine tonsils are symmetric. Parapharyngeal fat is preserved. The oropharynx and nasopharynx are within normal limits. No loculated retropharyngeal fluid collection. The hypopharynx and supraglottic larynx are normal. Epiglottis is normal. Vallecula is clear. True vocal cords are symmetric. Subglottic airway is normal.  Thyroid gland is within normal limits. Calcification noted within the left thyroid lobe.  No pathologically enlarged lymph nodes are identified within the neck. Visualized superior mediastinum is normal.  Visualized lungs are clear.  Normal intravascular enhancement seen within the neck.  No acute osseous abnormality identified. No worrisome lytic or blastic osseous lesions.  IMPRESSION: Normal CT of the neck without evidence of active active inflammatory process, cervical adenopathy, or other acute abnormality.    Electronically Signed   By: Rise MuBenjamin  McClintock M.D.   On: 12/01/2013 23:10     EKG Interpretation None      MDM   Kadi E Allison is a 33 y.o. female with DM 1 presents with hypotension and tachycardia, from urgent care.  Also c/o sore throat.  No tonsilar or retropharyngeal abscess apparent on exam.  Uvula midline. No erythema or tonsilar exudate.  Normal neck ROM.  No meningismus.    She is ill appearing but non-toxic.  Tachycardic.  Low normal BP.  Remainder of exam unremarkable.  CT neck w contrast normal.  Labs consistent w DKA; bicarbonate 10, glucose 600s initially, GAP 30, lactate 2.95.  Will admit to family medicine service, stepdown unit.  She received 2.5 L IVF bolus; started insulin infusion.     Final diagnoses:  DKA (diabetic ketoacidoses)  Dehydration  Hyponatremia  Lactic acidosis      Toney SangJerrid Qiara Minetti, MD 12/02/13 0009

## 2013-12-01 NOTE — ED Provider Notes (Signed)
Patient seen/examined in the Emergency Department in conjunction with Resident Physician Provider Pippin Patient reports sore throat and elevated blood glucose Exam : awake/alert.  Voice normal.  No stridor but endorses pain with any neck movement.  She smells ketotic Plan: CT imaging of neck and lab studies    Kristine Allison Kristine Kenyetta Fife, MD 12/01/13 2143

## 2013-12-01 NOTE — ED Notes (Signed)
Patient states she was in the hospital for DKA last Sunday; and now has terrible sore throat and states she has not been eating or drinking anything.

## 2013-12-01 NOTE — ED Provider Notes (Signed)
Medical screening examination/treatment/procedure(s) were performed by resident physician or non-physician practitioner and as supervising physician I was immediately available for consultation/collaboration.   Barkley BrunsKINDL,JAMES DOUGLAS MD.   Linna HoffJames D Kindl, MD 12/01/13 2046

## 2013-12-01 NOTE — ED Provider Notes (Signed)
CSN: [id number]     Arrival date & time 12/01/13  1911 History   None    No chief complaint on file.  (Consider location/radiation/quality/duration/timing/severity/associated sxs/prior Treatment) HPI Comments: 33 yo WF recently hospitalized for DKA presents with several days of sore throat and decreased fluid intake. She is feeling weak and heart is racing.   Patient is a 33 y.o. female presenting with pharyngitis.  Sore Throat    Past Medical History  Diagnosis Date  . Asthma   . Diabetes mellitus without complication   . NWGNFAOZ(308.6Headache(784.0)    Past Surgical History  Procedure Laterality Date  . No past surgeries     Family History  Problem Relation Age of Onset  . Heart disease      No family history   History  Substance Use Topics  . Smoking status: Current Every Day Smoker -- 0.40 packs/day for 20 years    Types: Cigarettes  . Smokeless tobacco: Never Used     Comment: only smoking electronic cigs  . Alcohol Use: No   OB History   Grav Para Term Preterm Abortions TAB SAB Ect Mult Living                 Review of Systems  Constitutional: Positive for fatigue.  HENT: Positive for sore throat.   All other systems reviewed and are negative.    Allergies  Latex  Home Medications   Current Outpatient Rx  Name  Route  Sig  Dispense  Refill  . albuterol (PROAIR HFA) 108 (90 BASE) MCG/ACT inhaler   Inhalation   Inhale 2 puffs into the lungs 2 (two) times daily as needed for wheezing or shortness of breath.         Marland Kitchen. aspirin EC 81 MG EC tablet   Oral   Take 1 tablet (81 mg total) by mouth daily.   30 tablet   11   . cephALEXin (KEFLEX) 500 MG capsule   Oral   Take 1 capsule (500 mg total) by mouth 3 (three) times daily.   21 capsule   0   . insulin NPH-regular Human (NOVOLIN 70/30) (70-30) 100 UNIT/ML injection   Subcutaneous   Inject 15-20 Units into the skin 3 (three) times daily with meals. 10 units at breakfast, 15 units at lunch, and 10 units at  dinner         . metFORMIN (GLUCOPHAGE-XR) 500 MG 24 hr tablet   Oral   Take 2 tablets (1,000 mg total) by mouth daily with breakfast.   60 tablet   4    LMP 10/23/2013 Physical Exam  Nursing note and vitals reviewed. Cardiovascular: Normal heart sounds.   Rapid  Pulmonary/Chest: Effort normal and breath sounds normal.  Neurological: She is alert.  Skin: Skin is warm.    ED Course  Procedures (including critical care time) Labs Review Labs Reviewed - No data to display Imaging Review No results found.   MDM  Transfer to ER for further evaluation of Dehydration    Berenice PrimasMelissa R Alane Hanssen, PA-C 12/01/13 1940

## 2013-12-02 ENCOUNTER — Encounter (HOSPITAL_COMMUNITY): Payer: Self-pay | Admitting: Emergency Medicine

## 2013-12-02 DIAGNOSIS — E860 Dehydration: Secondary | ICD-10-CM

## 2013-12-02 DIAGNOSIS — I959 Hypotension, unspecified: Secondary | ICD-10-CM

## 2013-12-02 DIAGNOSIS — E1110 Type 2 diabetes mellitus with ketoacidosis without coma: Secondary | ICD-10-CM

## 2013-12-02 DIAGNOSIS — F17200 Nicotine dependence, unspecified, uncomplicated: Secondary | ICD-10-CM

## 2013-12-02 DIAGNOSIS — E872 Acidosis, unspecified: Secondary | ICD-10-CM

## 2013-12-02 DIAGNOSIS — E871 Hypo-osmolality and hyponatremia: Secondary | ICD-10-CM

## 2013-12-02 LAB — BASIC METABOLIC PANEL
BUN: 13 mg/dL (ref 6–23)
BUN: 13 mg/dL (ref 6–23)
BUN: 11 mg/dL (ref 6–23)
BUN: 10 mg/dL (ref 6–23)
BUN: 10 mg/dL (ref 6–23)
BUN: 9 mg/dL (ref 6–23)
BUN: 8 mg/dL (ref 6–23)
BUN: 8 mg/dL (ref 6–23)
BUN: 10 mg/dL (ref 6–23)
CALCIUM: 7.5 mg/dL — AB (ref 8.4–10.5)
CHLORIDE: 102 meq/L (ref 96–112)
CHLORIDE: 102 meq/L (ref 96–112)
CHLORIDE: 101 meq/L (ref 96–112)
CHLORIDE: 104 meq/L (ref 96–112)
CHLORIDE: 105 meq/L (ref 96–112)
CHLORIDE: 103 meq/L (ref 96–112)
CHLORIDE: 101 meq/L (ref 96–112)
CO2: 15 mEq/L — ABNORMAL LOW (ref 19–32)
CO2: 15 meq/L — AB (ref 19–32)
CO2: 16 meq/L — AB (ref 19–32)
CO2: 18 meq/L — AB (ref 19–32)
CO2: 18 meq/L — AB (ref 19–32)
CO2: 18 meq/L — AB (ref 19–32)
CO2: 16 mEq/L — ABNORMAL LOW (ref 19–32)
CO2: 20 mEq/L (ref 19–32)
CO2: 20 meq/L (ref 19–32)
CREATININE: 0.58 mg/dL (ref 0.50–1.10)
CREATININE: 0.53 mg/dL (ref 0.50–1.10)
CREATININE: 0.48 mg/dL — AB (ref 0.50–1.10)
CREATININE: 0.7 mg/dL (ref 0.50–1.10)
Calcium: 7.2 mg/dL — ABNORMAL LOW (ref 8.4–10.5)
Calcium: 7.3 mg/dL — ABNORMAL LOW (ref 8.4–10.5)
Calcium: 7.1 mg/dL — ABNORMAL LOW (ref 8.4–10.5)
Calcium: 7.4 mg/dL — ABNORMAL LOW (ref 8.4–10.5)
Calcium: 7.4 mg/dL — ABNORMAL LOW (ref 8.4–10.5)
Calcium: 7.4 mg/dL — ABNORMAL LOW (ref 8.4–10.5)
Calcium: 7.4 mg/dL — ABNORMAL LOW (ref 8.4–10.5)
Calcium: 7.6 mg/dL — ABNORMAL LOW (ref 8.4–10.5)
Chloride: 105 mEq/L (ref 96–112)
Chloride: 102 mEq/L (ref 96–112)
Creatinine, Ser: 0.58 mg/dL (ref 0.50–1.10)
Creatinine, Ser: 0.55 mg/dL (ref 0.50–1.10)
Creatinine, Ser: 0.47 mg/dL — ABNORMAL LOW (ref 0.50–1.10)
Creatinine, Ser: 0.47 mg/dL — ABNORMAL LOW (ref 0.50–1.10)
Creatinine, Ser: 0.62 mg/dL (ref 0.50–1.10)
GFR calc Af Amer: >90 mL/min (ref >90)
GFR calc Af Amer: >90 mL/min (ref >90)
GFR calc Af Amer: >90 mL/min (ref >90)
GFR calc Af Amer: >90 mL/min (ref >90)
GFR calc Af Amer: >90 mL/min (ref >90)
GFR calc Af Amer: >90 mL/min (ref >90)
GFR calc Af Amer: >90 mL/min (ref >90)
GFR calc Af Amer: >90 mL/min (ref >90)
GFR calc non Af Amer: >90 mL/min (ref >90)
GFR calc non Af Amer: >90 mL/min (ref >90)
GFR calc non Af Amer: >90 mL/min (ref >90)
GFR calc non Af Amer: >90 mL/min (ref >90)
GFR calc non Af Amer: >90 mL/min (ref >90)
GFR calc non Af Amer: >90 mL/min (ref >90)
GFR calc non Af Amer: >90 mL/min (ref >90)
GLUCOSE: 163 mg/dL — AB (ref 70–99)
GLUCOSE: 90 mg/dL (ref 70–99)
GLUCOSE: 133 mg/dL — AB (ref 70–99)
GLUCOSE: 202 mg/dL — AB (ref 70–99)
Glucose, Bld: 161 mg/dL — ABNORMAL HIGH (ref 70–99)
Glucose, Bld: 360 mg/dL — ABNORMAL HIGH (ref 70–99)
Glucose, Bld: 193 mg/dL — ABNORMAL HIGH (ref 70–99)
Glucose, Bld: 312 mg/dL — ABNORMAL HIGH (ref 70–99)
Glucose, Bld: 258 mg/dL — ABNORMAL HIGH (ref 70–99)
POTASSIUM: 4.2 meq/L (ref 3.7–5.3)
POTASSIUM: 4.2 meq/L (ref 3.7–5.3)
POTASSIUM: 3.2 meq/L — AB (ref 3.7–5.3)
POTASSIUM: 3.5 meq/L — AB (ref 3.7–5.3)
Potassium: 3.4 mEq/L — ABNORMAL LOW (ref 3.7–5.3)
Potassium: 3.2 mEq/L — ABNORMAL LOW (ref 3.7–5.3)
Potassium: 3.8 mEq/L (ref 3.7–5.3)
Potassium: 3.4 mEq/L — ABNORMAL LOW (ref 3.7–5.3)
Potassium: 3.6 mEq/L — ABNORMAL LOW (ref 3.7–5.3)
SODIUM: 134 meq/L — AB (ref 137–147)
SODIUM: 132 meq/L — AB (ref 137–147)
SODIUM: 132 meq/L — AB (ref 137–147)
Sodium: 134 mEq/L — ABNORMAL LOW (ref 137–147)
Sodium: 135 mEq/L — ABNORMAL LOW (ref 137–147)
Sodium: 136 mEq/L — ABNORMAL LOW (ref 137–147)
Sodium: 136 mEq/L — ABNORMAL LOW (ref 137–147)
Sodium: 135 mEq/L — ABNORMAL LOW (ref 137–147)
Sodium: 135 mEq/L — ABNORMAL LOW (ref 137–147)

## 2013-12-02 LAB — ETHANOL: Alcohol, Ethyl (B): <11 mg/dL (ref 0–11)

## 2013-12-02 LAB — GLUCOSE, CAPILLARY
GLUCOSE-CAPILLARY: 283 mg/dL — AB (ref 70–99)
GLUCOSE-CAPILLARY: 281 mg/dL — AB (ref 70–99)
GLUCOSE-CAPILLARY: 85 mg/dL (ref 70–99)
GLUCOSE-CAPILLARY: 156 mg/dL — AB (ref 70–99)
GLUCOSE-CAPILLARY: 227 mg/dL — AB (ref 70–99)
GLUCOSE-CAPILLARY: 289 mg/dL — AB (ref 70–99)
GLUCOSE-CAPILLARY: 344 mg/dL — AB (ref 70–99)
Glucose-Capillary: 183 mg/dL — ABNORMAL HIGH (ref 70–99)
Glucose-Capillary: 150 mg/dL — ABNORMAL HIGH (ref 70–99)
Glucose-Capillary: 350 mg/dL — ABNORMAL HIGH (ref 70–99)
Glucose-Capillary: 188 mg/dL — ABNORMAL HIGH (ref 70–99)
Glucose-Capillary: 93 mg/dL (ref 70–99)
Glucose-Capillary: 82 mg/dL (ref 70–99)
Glucose-Capillary: 108 mg/dL — ABNORMAL HIGH (ref 70–99)
Glucose-Capillary: 136 mg/dL — ABNORMAL HIGH (ref 70–99)
Glucose-Capillary: 208 mg/dL — ABNORMAL HIGH (ref 70–99)
Glucose-Capillary: 353 mg/dL — ABNORMAL HIGH (ref 70–99)
Glucose-Capillary: 266 mg/dL — ABNORMAL HIGH (ref 70–99)
Glucose-Capillary: 186 mg/dL — ABNORMAL HIGH (ref 70–99)

## 2013-12-02 LAB — CBC
HEMATOCRIT: 36.5 % (ref 36.0–46.0)
Hemoglobin: 12.6 g/dL (ref 12.0–15.0)
MCH: 31.7 pg (ref 26.0–34.0)
MCHC: 34.5 g/dL (ref 30.0–36.0)
MCV: 91.7 fL (ref 78.0–100.0)
Platelets: 220 10*3/uL (ref 150–400)
RBC: 3.98 MIL/uL (ref 3.87–5.11)
RDW: 14.7 % (ref 11.5–15.5)
WBC: 5.9 10*3/uL (ref 4.0–10.5)

## 2013-12-02 LAB — RAPID STREP SCREEN (MED CTR MEBANE ONLY): STREPTOCOCCUS, GROUP A SCREEN (DIRECT): NEGATIVE

## 2013-12-02 LAB — CBG MONITORING, ED: Glucose-Capillary: 287 mg/dL — ABNORMAL HIGH (ref 70–99)

## 2013-12-02 LAB — RAPID URINE DRUG SCREEN, HOSP PERFORMED
AMPHETAMINES: NOT DETECTED
BENZODIAZEPINES: NOT DETECTED
Barbiturates: POSITIVE — AB
COCAINE: NOT DETECTED
OPIATES: POSITIVE — AB
Tetrahydrocannabinol: NOT DETECTED

## 2013-12-02 LAB — LACTIC ACID, PLASMA: Lactic Acid, Venous: 1 mmol/L (ref 0.5–2.2)

## 2013-12-02 MED ORDER — SODIUM CHLORIDE 0.9 % IV SOLN
INTRAVENOUS | Status: DC
  Administered 2013-12-02: 1.2 [IU]/h via INTRAVENOUS
  Filled 2013-12-02: qty 1

## 2013-12-02 MED ORDER — SODIUM CHLORIDE 0.9 % IV SOLN
INTRAVENOUS | Status: DC
  Administered 2013-12-02: 06:00:00 via INTRAVENOUS

## 2013-12-02 MED ORDER — FLUCONAZOLE 200 MG PO TABS
200.0000 mg | ORAL_TABLET | Freq: Once | ORAL | Status: AC
  Administered 2013-12-02: 200 mg via ORAL
  Filled 2013-12-02: qty 1

## 2013-12-02 MED ORDER — INSULIN ASPART 100 UNIT/ML ~~LOC~~ SOLN
0.0000 [IU] | SUBCUTANEOUS | Status: DC
Start: 2013-12-02 — End: 2013-12-02
  Administered 2013-12-02: 11 [IU] via SUBCUTANEOUS

## 2013-12-02 MED ORDER — DEXTROSE-NACL 5-0.45 % IV SOLN
INTRAVENOUS | Status: DC
  Administered 2013-12-02: 1000 mL via INTRAVENOUS
  Administered 2013-12-02: 09:00:00 via INTRAVENOUS

## 2013-12-02 MED ORDER — DEXTROSE 50 % IV SOLN: 25.0000 mL | INTRAVENOUS | Status: DC | PRN

## 2013-12-02 MED ORDER — INSULIN ASPART 100 UNIT/ML ~~LOC~~ SOLN
0.0000 [IU] | Freq: Three times a day (TID) | SUBCUTANEOUS | Status: DC
  Administered 2013-12-02: 3 [IU] via SUBCUTANEOUS
  Administered 2013-12-03: 5 [IU] via SUBCUTANEOUS

## 2013-12-02 MED ORDER — METFORMIN HCL ER 500 MG PO TB24
1000.0000 mg | ORAL_TABLET | Freq: Every day | ORAL | Status: DC
  Administered 2013-12-03: 1000 mg via ORAL
  Filled 2013-12-02 (×3): qty 2

## 2013-12-02 MED ORDER — INSULIN GLARGINE 100 UNIT/ML ~~LOC~~ SOLN
20.0000 [IU] | Freq: Once | SUBCUTANEOUS | Status: AC
  Administered 2013-12-02: 20 [IU] via SUBCUTANEOUS
  Filled 2013-12-02: qty 0.2

## 2013-12-02 MED ORDER — INSULIN ASPART 100 UNIT/ML ~~LOC~~ SOLN
0.0000 [IU] | Freq: Every day | SUBCUTANEOUS | Status: DC
Start: 2013-12-02 — End: 2013-12-02

## 2013-12-02 MED ORDER — FLUCONAZOLE 100 MG PO TABS
100.0000 mg | ORAL_TABLET | Freq: Every day | ORAL | Status: DC
  Administered 2013-12-03: 100 mg via ORAL
  Filled 2013-12-02: qty 1

## 2013-12-02 MED ORDER — FAMOTIDINE 20 MG PO TABS
20.0000 mg | ORAL_TABLET | Freq: Two times a day (BID) | ORAL | Status: DC
  Administered 2013-12-02 – 2013-12-03 (×3): 20 mg via ORAL
  Filled 2013-12-02 (×5): qty 1

## 2013-12-02 MED ORDER — ASPIRIN EC 81 MG PO TBEC
81.0000 mg | DELAYED_RELEASE_TABLET | Freq: Every day | ORAL | Status: DC
  Administered 2013-12-02 – 2013-12-03 (×2): 81 mg via ORAL
  Filled 2013-12-02 (×2): qty 1

## 2013-12-02 MED ORDER — OXYCODONE HCL 5 MG PO TABS
5.0000 mg | ORAL_TABLET | ORAL | Status: DC | PRN
  Administered 2013-12-02 – 2013-12-03 (×6): 5 mg via ORAL
  Filled 2013-12-02 (×6): qty 1

## 2013-12-02 MED ORDER — POTASSIUM CHLORIDE CRYS ER 20 MEQ PO TBCR
40.0000 meq | EXTENDED_RELEASE_TABLET | Freq: Once | ORAL | Status: AC
  Administered 2013-12-02: 40 meq via ORAL
  Filled 2013-12-02: qty 2

## 2013-12-02 MED ORDER — INSULIN ASPART 100 UNIT/ML ~~LOC~~ SOLN: 0.0000 [IU] | Freq: Three times a day (TID) | SUBCUTANEOUS | Status: DC

## 2013-12-02 MED ORDER — ALBUTEROL SULFATE (2.5 MG/3ML) 0.083% IN NEBU: 3.0000 mL | INHALATION_SOLUTION | Freq: Two times a day (BID) | RESPIRATORY_TRACT | Status: DC | PRN

## 2013-12-02 MED ORDER — HEPARIN SODIUM (PORCINE) 5000 UNIT/ML IJ SOLN
5000.0000 [IU] | Freq: Three times a day (TID) | INTRAMUSCULAR | Status: DC
  Administered 2013-12-02 – 2013-12-03 (×3): 5000 [IU] via SUBCUTANEOUS
  Filled 2013-12-02 (×7): qty 1

## 2013-12-02 MED ORDER — INSULIN GLARGINE 100 UNIT/ML ~~LOC~~ SOLN
20.0000 [IU] | Freq: Every day | SUBCUTANEOUS | Status: DC
  Filled 2013-12-02: qty 0.2

## 2013-12-02 MED ORDER — SODIUM CHLORIDE 0.9 % IV SOLN
INTRAVENOUS | Status: AC
  Administered 2013-12-02: 1000 mL via INTRAVENOUS

## 2013-12-02 MED ORDER — CEPHALEXIN 500 MG PO CAPS
500.0000 mg | ORAL_CAPSULE | Freq: Three times a day (TID) | ORAL | Status: DC
  Administered 2013-12-02 (×3): 500 mg via ORAL
  Filled 2013-12-02 (×6): qty 1

## 2013-12-02 NOTE — H&P (Signed)
FMTS Attending Admission Note: Kristine DonJeff Walden MD Personal pager:  [phone] FPTS Service Pager:  [phone]  I  have seen and examined this patient, reviewed their chart. I have discussed this patient with the resident. I agree with the resident's findings, assessment and care plan.  Additionally:  33 yo F with known DM1 and recurrent DKA admissions who presents with sore throat and found again to be in DKA.  Also found to be hypotensive.  Started on insulin gtt and admitted to FPTS.  Exam this AM reveals thin female, chronically ill appearing, NAD.  RRR, lungs clear.  No abdominal pain.  No LE edema.  Alert and oriented.    Imp/Plan: 1. DKA:   - agree with glucomander, switch to lantus once gap closed.   - Needs EKG, appears to be noncompliance but want to ensure infarction not cause of DKA.  Low liklihood with normal myoview recently  2.  Hypotension - resolved, likely secondary to polyuria and osmotic diuresis.    Tobey GrimJeffrey H Walden, MD 12/02/2013 9:14 AM

## 2013-12-02 NOTE — Progress Notes (Signed)
Family Medicine Teaching Service Daily Progress Note Intern Pager: [phone]  Patient name: Kristine Allison Medical record number: [id number] Date of birth: 01-26-1981 Age: 33 y.o. Gender: female  Primary Care Provider: Janit PaganENIOLA, KEHINDE, MD Consultants: none Code Status: Full  Pt Overview and Major Events to Date:   Assessment and Plan: Kristine Allison is a 33 y.o. female presenting with sore throat to urgent care and found to be hypotensive and in DKA. PMH is significant for diabetes for which she was recently admitted to the ICU (2/24-3/1), thought due to noncompliance at that time.   # DKA - with clinical dehydration. Last Bmet at 8am with Bicarb 18, gap 14, potassium 3.2.  - Insulin drip/glucostabilizer protocol - BMET q2 to monitor electrolytes / K and gap - once gap closes will overlap with 15-20 units lantus - UDS and blood alcohol - DM education - F/u blood culture.  # Hypokalemia: received K+ with initial fluid resuscitation, potassium 3.4>3.2 - replete orally and trend with bmet  # Hypotension - initially 70s systolic, improved to 90-100s with IV fluids, likely secondary to osmotic diuresis.   # Hyponatremia, resolved - on admission 128 corrected for glucose to 131. Now 134 corrected to 137 - Continue IV fluids  - Recheck BMET q2hr   # Throat pain - present at last office visit and hosp before that, normal lexiscan myoview 3/1 recommended daily asa and thought cp related to throat pain. CT in ED normal. - will treat empirically for candidiasis with diflucan - Continue H2 blocker and add oxyIR overnight   # H/o UTI - No dysuria or fever currently.  - Continue keflex - pt stated has a few more doses  FEN/GI: IV fluids per above; NPO  Prophylaxis: SQ heparin  Disposition: SDU until off insulin drip  Subjective:  Complains of being tired and some continued throat pain (more with swallowing solids than liquids), she is currently hungry and wants to eat.  Denies nausea/vomiting, CP, SOB.  Objective: Temp:  [97.4 F (36.3 C)-97.9 F (36.6 C)] 97.9 F (36.6 C) (03/12 2329) Pulse Rate:  [70-136] 92 (03/13 0333) Resp:  [11-21] 15 (03/13 0333) BP: (74-104)/(41-72) 86/41 mmHg (03/13 0333) SpO2:  [99 %-100 %] 100 % (03/13 0333) Weight:  [111 lb 5.3 oz (50.5 kg)] 111 lb 5.3 oz (50.5 kg) (03/13 0210) Physical Exam: General: NAD, laying in bed HEENT: PERRL, EOMI. MMM, no thrush noted, posterior pharynx not well visualized due to tongue Cardiovascular: RRR, normal s1/s2, no murmurs Respiratory: CTAB, normal effort, very faint end expiratory wheeze on left Abdomen: soft, non-tender, non-distended, normal bowel sounds Extremities: no edema or cyanosis  Laboratory:  Recent Labs Lab 12/01/13 2259 12/01/13 2310 12/02/13 0325  WBC 6.0  --  5.9  HGB 16.5* 17.3* 12.6  HCT 46.9* 51.0* 36.5  PLT 227  --  220    Recent Labs Lab 12/01/13 2259 12/01/13 2310 12/02/13 0325 12/02/13 0532  NA 131* 128* 135*  134* 134*  K 5.3 5.2 4.2  4.2 3.4*  CL 89* 101 102  102 101  CO2 10*  --  15*  15* 16*  BUN 19 28* 13  13 11   CREATININE 0.67 0.70 0.58  0.58 0.55  CALCIUM 8.9  --  7.2*  7.3* 7.1*  GLUCOSE 322* 313* 161*  163* 360*    Imaging/Diagnostic Tests:   Tawni CarnesAndrew Jace Dowe, MD 12/02/2013, 7:34 AM PGY-1, [place] [facility] FPTS Intern pager: [phone], text pages welcome

## 2013-12-02 NOTE — Progress Notes (Signed)
Inpatient Diabetes Program Recommendations  AACE/ADA: New Consensus Statement on Inpatient Glycemic Control (2013)  Target Ranges:  Prepandial:   less than 140 mg/dL      Peak postprandial:   less than 180 mg/dL (1-2 hours)      Critically ill patients:  140 - 180 mg/dL   Reason for Visit:Type 1 diabetes. Referral received.  Pt. Was just discharged in late February after DKA.  She went to see family practice on 11/28/13. She was placed on 70/30 tid with meals.    Patient states today that she was fine but "they made her come in because of low blood pressure".  She states that the resident has said that she is now going to go back on Lantus/Novolog regimen.  Note that patient does not have insurance, so will need to be able to obtain Lantus/Novolog from clinic due to cost.    Patient continues to have a flat affect.  May benefit from depression screening.  She has been going to her follow-up appointments and has also seen the diabetes educator outpatient.  She states that she has no questions at this time.  She has received Lantus and hopes to transition off insulin drip soon however they are waiting til acidosis is cleared and Anion gap is closed. CO2=16 at 1349.  Beryl MeagerJenny German Manke, RN, BC-ADM Inpatient Diabetes Coordinator Pager [phone]

## 2013-12-02 NOTE — Care Management Note (Signed)
    Page 1 of 1   12/02/2013     3:56:04 PM   CARE MANAGEMENT NOTE 12/02/2013  Patient:  Kristine Allison,Kristine Allison   Account Number:  [id number]  Date Initiated:  12/02/2013  Documentation initiated by:  GRAVES-BIGELOW,Daianna Vasques  Subjective/Objective Assessment:   Pt admitted for Sore throat and DKA with clinical dehydration. Glucose 354 on admission, K 5.2. Initiated on glucose stabilizer.     Action/Plan:   [facility] Clinic will help pt with the MAPP Program and [facility] will provide the pt with Lantus before she leaves. NO needs from CM at this time.   Anticipated DC Date:  12/03/2013   Anticipated DC Plan:  HOME/SELF CARE      DC Planning Services  CM consult  Medication Assistance      Choice offered to / List presented to:             Status of service:  Completed, signed off Medicare Important Message given?   (If response is "NO", the following Medicare IM given date fields will be blank) Date Medicare IM given:   Date Additional Medicare IM given:    Discharge Disposition:  HOME/SELF CARE  Per UR Regulation:  Reviewed for med. necessity/level of care/duration of stay  If discussed at Long Length of Stay Meetings, dates discussed:    Comments:

## 2013-12-02 NOTE — ED Notes (Signed)
Attempted second PIV start and blood draw unsuccessful x2 attempts.

## 2013-12-02 NOTE — Progress Notes (Signed)
FMTS Attending Daily Note:  Renold DonJeff Malakhi Markwood MD  [phone] pager  Family Practice pager:  [phone] I have discussed this patient with the resident Dr. Waynetta SandyWight and attending physician Dr. Leveda AnnaHensel.  I agree with their findings, assessment, and care plan

## 2013-12-02 NOTE — Progress Notes (Signed)
Utilization review completed. Tasheema Perrone, RN, BSN. 

## 2013-12-02 NOTE — H&P (Signed)
Family [facility] Teaching [facility] Ad[facility] History and Physical Service Pager: [phone]  Patient name: Kristine Allison Medical record number: [id number] Date of birth: 07-Dec-1980 Age: 33 y.o. Gender: female  Primary Care Provider: Janit PaganENIOLA, KEHINDE, MD Consultants: None Code Status: Full per discussion with patient  Chief Complaint: Sore throat, DKA  Assessment and Plan: Kristine Allison is a 33 y.o. female presenting with sore throat to urgent care and found to be hypotensive and in DKA. PMH is significant for diabetes for which she was recently admitted to the ICU (2/24-3/1), thought due to noncompliance at that time.   # DKA - with clinical dehydration. Glucose 354 on admission, K 5.2. BUN 28, Cr 0.7, Na 128 (corrected to 131), bicarb 10, elevated anion gap. Infectious cause unlikely with no dysuria (previous UTI, treating with keflex), no cough (sore throat but does not appear infectious), and no leukocytosis). Also consider nonadherence. A1c most recently 13.2. Lactic acidosis 2.95 likely secondary to hypoperfusion of tissues due to hypotension. UPT neg. - Admit to stepdown unit for insulin drip and given recent hypotension, attending Dr Leveda AnnaHensel. - If continues improving on fluids and insulin drip, likely can transition out tomorrow. - Continue insulin drip started in ED - IV fluids - NS 1L/hr x 2 hrs followed by 200cc/hr - BMET q2 to monitor electrolytes / K and gap - Add Dextrose-containing fluids once glucose <250 - Transition to sq insulin once gap closes - UDS and blood alcohol - DM education - Recheck lactic acid tomorrow - F/u blood culture. - Check CXR if any resp issues develop to eval for source of infection. - NPO pending transition off insulin drip  # Tachycardia - Likely secondary to dehydration. Improving s/p 2.5L in ED. - IV fluids - Tele  # Hypotension - initially 70s systolic, improved to 90-100s with IV fluids, likely secondary to osmotic  diuresis.  # Hyponatremia - 128 corrected for glucose to 131.  - Continue IV fluids - Recheck BMET q2hr  # Throat pain - present at last office visit and hosp before that, normal lexiscan myoview 3/1 recommended daily asa and thought cp related to throat pain. CT in ED normal. - See if resolves with hydration - Centor criteria - no fever or exudates, 1 for absence of cough - Continue H2 blocker and add oxyIR overnight - Consider outpatient workup  # H/o UTI - No dysuria or fever currently. - Cotninue keflex - pt stated has a few more doses  FEN/GI: IV fluids per above; NPO Prophylaxis: SQ heparin  Disposition: Pending no longer in DKA and stabilized blood sugars. Pt wants to leave tomorrow and states she will leave whether we tell her or not.  History of Present Illness: Kristine Allison is a 33 y.o. female presenting with DKA. She presented to Urgent Care today for worsening throat pain and was found to be hypotensive and sent to ED. She states she feels fine other than her throat pain and fatigue, the latter of which has improved with hydration. She has had throat pain for ~1-2 weeks now and it has worsened to 8/10, currently 5/10 after morphine. She has been able to drink liquids but has had a hard time with foods due to throat pain. She denies fevers, chills, coughing/sputum production, dyspnea. She has tried Environmental consultantcepacol losenges, chloraseptic spray, ibuprofen, zantac, and nothing has helped except GI cocktail helped some last time she was in the hospital. She does not feel any different than normal otherwise and is frustrated  and surprised that she is in DKA. She denies confusion, abdominal pain, nausea, or vomiting. She very much wants to leave and states she will definitely leave tomorrow because someone needs to be with her 89 year old child. She would like an EGD but does not want to stay that long. She reports taking insulin as prescribed at home with 10 units AM, 15 units lunch, and  10 units PM. Blood sugars range 160-230 with none above or below that this week.  Review Of Systems: Per HPI with the following additions: Denies diarrhea, abdominal pain, chest pain, rash, or confusion Otherwise 12 point review of systems was performed and was unremarkable.  Patient Active Problem List   Diagnosis Date Noted  . Elevated troponin I level 11/19/2013  . Acute myocardial infarction, subendocardial infarction, initial episode of care 11/18/2013  . DKA (diabetic ketoacidoses) 11/15/2013  . Noncompliance with medication regimen 11/15/2013  . Leg pain 09/08/2013  . Loss of weight 09/08/2013  . Depression 09/08/2013  . Hypotension, unspecified 09/08/2013  . Tobacco use disorder 08/01/2013  . Diabetes mellitus 06/24/2012  . GERD (gastroesophageal reflux disease) 06/17/2012   Past Medical History: Past Medical History  Diagnosis Date  . Asthma   . Diabetes mellitus without complication   . ZOXWRUEA(540.9)    Past Surgical History: Past Surgical History  Procedure Laterality Date  . No past surgeries     Social History: History  Substance Use Topics  . Smoking status: Current Every Day Smoker -- 0.40 packs/day for 20 years    Types: Cigarettes  . Smokeless tobacco: Current User     Comment: only smoking electronic cigs  . Alcohol Use: No  No current drug use or h/o IVDU  Additional social history: Lives with parents and 66 year old child. Please also refer to relevant sections of EMR.  Family History: Family History  Problem Relation Age of Onset  . Heart disease      No family history   Allergies and Medications: Allergies  Allergen Reactions  . Latex Rash   No current facility-administered medications on file prior to encounter.   Current Outpatient Prescriptions on File Prior to Encounter  Medication Sig Dispense Refill  . albuterol (PROAIR HFA) 108 (90 BASE) MCG/ACT inhaler Inhale 2 puffs into the lungs 2 (two) times daily as needed for wheezing or  shortness of breath.      Marland Kitchen aspirin EC 81 MG EC tablet Take 1 tablet (81 mg total) by mouth daily.  30 tablet  11  . cephALEXin (KEFLEX) 500 MG capsule Take 1 capsule (500 mg total) by mouth 3 (three) times daily.  21 capsule  0  . insulin NPH-regular Human (NOVOLIN 70/30) (70-30) 100 UNIT/ML injection Inject 15-20 Units into the skin 3 (three) times daily with meals. 10 units at breakfast, 15 units at lunch, and 10 units at dinner      . metFORMIN (GLUCOPHAGE-XR) 500 MG 24 hr tablet Take 2 tablets (1,000 mg total) by mouth daily with breakfast.  60 tablet  4    Objective: BP 93/55  Temp(Src) 97.9 F (36.6 C) (Oral)  Resp 13  LMP 10/23/2013 Exam: General: NAD, lying in bed frustrated-appearing HEENT: AT/[place], sclera clear, EOMI, PERRL, o/p clear with no erythema or exudate or tonsillar hypertrophy, MMM Cardiovascular: RRR, no m/r/g, 1+ bilateral PT and radial pulses, 3 second cap refill and cool extremities Respiratory: faint upper lobe wheeze, normal effort Abdomen: Soft, mildly tender left middle abdomen, nondistended Extremities: No LE edema  or calf tenderness Skin: No rash or cyanosis; very thinned hair with brittle/broken pieces Neuro: Awake, alert, no focal deficit, normal speech  Labs and Imaging: CBC BMET   Recent Labs Lab 12/01/13 2259 12/01/13 2310  WBC 6.0  --   HGB 16.5* 17.3*  HCT 46.9* 51.0*  PLT 227  --     Recent Labs Lab 12/01/13 2259 12/01/13 2310  NA 131* 128*  K 5.3 5.2  CL 89* 101  CO2 10*  --   BUN 19 28*  CREATININE 0.67 0.70  GLUCOSE 322* 313*  CALCIUM 8.9  --      Lactic acid 2.95 UPT negative  CT soft tissue neck with contrast: IMPRESSION:  Normal CT of the neck without evidence of active active inflammatory  process, cervical adenopathy, or other acute abnormality.    Leona Singleton, MD 12/02/2013, 12:19 AM PGY-2, [facility] FPTS Intern pager: [phone], text pages welcome

## 2013-12-02 NOTE — ED Notes (Signed)
CRITICAL VALUE ALERT  Critical value received:  CO2 10  Date of notification:  12/01/2013  Time of notification:  2335  Critical value read back:yes  Nurse who received alert:  Cassie FreerKimberly Huda Petrey, RN  MD notified: Dr. Lavella LemonsManly, EDP

## 2013-12-03 DIAGNOSIS — E119 Type 2 diabetes mellitus without complications: Secondary | ICD-10-CM

## 2013-12-03 DIAGNOSIS — K219 Gastro-esophageal reflux disease without esophagitis: Secondary | ICD-10-CM

## 2013-12-03 LAB — BASIC METABOLIC PANEL
BUN: 7 mg/dL (ref 6–23)
BUN: 6 mg/dL (ref 6–23)
CO2: 21 mEq/L (ref 19–32)
CO2: 22 mEq/L (ref 19–32)
CREATININE: 0.54 mg/dL (ref 0.50–1.10)
Calcium: 8 mg/dL — ABNORMAL LOW (ref 8.4–10.5)
Calcium: 8.2 mg/dL — ABNORMAL LOW (ref 8.4–10.5)
Chloride: 105 mEq/L (ref 96–112)
Chloride: 103 mEq/L (ref 96–112)
Creatinine, Ser: 0.51 mg/dL (ref 0.50–1.10)
GFR calc non Af Amer: >90 mL/min (ref >90)
GLUCOSE: 97 mg/dL (ref 70–99)
Glucose, Bld: 81 mg/dL (ref 70–99)
POTASSIUM: 3.6 meq/L — AB (ref 3.7–5.3)
Potassium: 3.2 mEq/L — ABNORMAL LOW (ref 3.7–5.3)
SODIUM: 138 meq/L (ref 137–147)
Sodium: 138 mEq/L (ref 137–147)

## 2013-12-03 LAB — GLUCOSE, CAPILLARY
Glucose-Capillary: 80 mg/dL (ref 70–99)
Glucose-Capillary: 220 mg/dL — ABNORMAL HIGH (ref 70–99)

## 2013-12-03 MED ORDER — INSULIN GLARGINE 100 UNIT/ML ~~LOC~~ SOLN
20.0000 [IU] | Freq: Every day | SUBCUTANEOUS | Status: DC
  Administered 2013-12-03: 20 [IU] via SUBCUTANEOUS
  Filled 2013-12-03: qty 0.2

## 2013-12-03 MED ORDER — FLUCONAZOLE 100 MG PO TABS: 100.0000 mg | ORAL_TABLET | Freq: Every day | ORAL | Status: DC

## 2013-12-03 MED ORDER — INSULIN ASPART 100 UNIT/ML ~~LOC~~ SOLN: 0.0000 [IU] | Freq: Three times a day (TID) | SUBCUTANEOUS | Status: DC

## 2013-12-03 MED ORDER — INSULIN GLARGINE 100 UNIT/ML ~~LOC~~ SOLN: 20.0000 [IU] | Freq: Every day | SUBCUTANEOUS | Status: DC

## 2013-12-03 MED ORDER — POTASSIUM CHLORIDE CRYS ER 20 MEQ PO TBCR
40.0000 meq | EXTENDED_RELEASE_TABLET | Freq: Once | ORAL | Status: AC
  Administered 2013-12-03: 40 meq via ORAL
  Filled 2013-12-03: qty 2

## 2013-12-03 NOTE — Discharge Summary (Signed)
Family Medicine Teaching Service  Discharge Note : Attending Nyjah Schwake MD Pager [phone] Office [phone] I have seen and examined this patient, reviewed their chart and discussed discharge planning wit the resident at the time of discharge. I agree with the discharge plan as above.  

## 2013-12-03 NOTE — Progress Notes (Signed)
FMTS Attending Daily Note: Larenz Frasier MD [phone] pager office [phone] I  have seen and examined this patient, reviewed their chart. I have discussed this patient with the resident. I agree with the resident's findings, assessment and care plan. 

## 2013-12-03 NOTE — Progress Notes (Signed)
Family Medicine Teaching Service Daily Progress Note Intern Pager: [phone]  Patient name: Kristine Allison Medical record number: [id number] Date of birth: 11-21-1980 Age: 33 y.o. Gender: female  Primary Care Provider: Janit PaganENIOLA, KEHINDE, MD Consultants: none Code Status: Full  Pt Overview and Major Events to Date:  3/13: off insulin drip in afternoon  Assessment and Plan: Kristine Allison is a 33 y.o. female presenting with sore throat to urgent care and found to be hypotensive and in DKA. PMH is significant for diabetes for which she was recently admitted to the ICU (2/24-3/1), thought due to noncompliance at that time.   # DKA, resolved; Diabetes - with clinical dehydration. Gap closed and transitioned off insulin drip yesterday afternoon. Bmet this AM with K+ 3.2, bicarb 22, gap 13. CBGs since off insulin drip 200-300s, 97 at 1am - Lantus 20 units daily. To go home with lantus sample from clinic. - SSI, qAC qHS  # Hypokalemia: 3.2 this AM - repeat replete orally with 40 mEq and trend with bmet  # Hypotension, improved: initially 70s systolic, improved to 90-100s with IV fluids, likely secondary to osmotic diuresis.   # Throat pain: present at last office visit and hosp before that, normal lexiscan myoview 3/1 recommended daily asa and thought cp related to throat pain. CT in ED normal. - will treat empirically for candidiasis with diflucan - Continue H2 blocker - pain control with oxyIR 5mg  (received x4 yesterday)  # H/o UTI: No dysuria or fever currently.  - chart review patient was discharged with 7 day course on 3/1. Restarted keflex here by report of patient requiring a few more doses, will discontinue now  FEN/GI: IV fluids per above; NPO  Prophylaxis: SQ heparin  Disposition: discharge today  Subjective:  Doing well this morning, pain with swallowing much improved. She would like to go home today.  Objective: Temp:  [97.6 F (36.4 C)-98.3 F (36.8 C)]  97.9 F (36.6 C) (03/14 0300) Pulse Rate:  [64-93] 68 (03/14 0300) Resp:  [10-24] 19 (03/14 0300) BP: (85-108)/(42-67) 107/56 mmHg (03/14 0300) SpO2:  [94 %-100 %] 100 % (03/14 0300) Physical Exam: General: NAD, laying in bed HEENT: PERRL, EOMI. MMM, no thrush noted, posterior pharynx not well visualized due to tongue Cardiovascular: RRR, normal s1/s2, no murmurs Respiratory: CTAB, normal effort, very faint end expiratory wheeze on left Abdomen: soft, non-tender, non-distended, normal bowel sounds Extremities: no edema or cyanosis  Laboratory:  Recent Labs Lab 12/01/13 2259 12/01/13 2310 12/02/13 0325  WBC 6.0  --  5.9  HGB 16.5* 17.3* 12.6  HCT 46.9* 51.0* 36.5  PLT 227  --  220    Recent Labs Lab 12/02/13 2148 12/03/13 0135 12/03/13 0559  NA 132* 138 138  K 3.6* 3.6* 3.2*  CL 102 105 103  CO2 20 21 22   BUN 10 7 6   CREATININE 0.62 0.51 0.54  CALCIUM 7.6* 8.0* 8.2*  GLUCOSE 258* 97 81    Imaging/Diagnostic Tests:   Tawni CarnesAndrew Loray Akard, MD 12/03/2013, 7:52 AM PGY-1, [facility] FPTS Intern pager: [phone], text pages welcome

## 2013-12-03 NOTE — Discharge Summary (Signed)
Family Medicine Teaching [facility] Discharge Summary  Patient name: Kristine Allison Medical record number: [id number] Date of birth: Apr 28, 1981 Age: 33 y.o. 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 female was admitted for hyperglycemia and found to be in DKA. She has past history of similar admissions for DKA, most recent from 2/24-2/28.   # DKA / DM: On admission with labs significant for glucose 354, bicarb 10, anion gap 33, lactic acid 2.95 (suspected from ischemia from dehydration). Patient with significant dehydration by exam and lab result of hemoglobin 16.5. Started on insulin drip and BMP monitored until gap closed the next afternoon; transitioned with lantus 20 units and SSI. CBG over the next 24 hours in 200-300 range, AM sugar 90. She was discharged home with sample vials of lantus and novolog with plan to apply for Medication Assistance Program (MAP) at [facility] for medication needs assistance.  # Throat pain, suspected candidal esophagitis: has had throat pain with swallowing over past 2 weeks. Rapid strep negative, pending strep culture. She had a CT of the neck with contrast that was negative for acute pathology. No visible lesions, however she was started on diflucan for suspected candidal esophagitis given her history of poor sugar control.  Issues for Follow Up:  1. Diabetes: given instructions on where to go for MAP, along with sample vials of lantus and novolog that should be  sufficient for 1 week. Follow up in clinic on Tuesday, patient verbalized understanding to record blood sugars for next several days to bring to this follow up 2. Throat pain: 14 day course of diflucan, if not resolved consider barium swallow or GI referral for endoscopy  Significant Procedures: insulin drip  Significant Labs and Imaging:   Recent Labs Lab 12/01/13 2259 12/01/13 2310 12/02/13 0325  WBC 6.0  --  5.9  HGB 16.5* 17.3* 12.6  HCT 46.9* 51.0* 36.5  PLT 227  --  220    Recent Labs Lab 12/02/13 1349 12/02/13 1915 12/02/13 2148 12/03/13 0135 12/03/13 0559  NA 135* 132* 132* 138 138  K 3.8 3.4* 3.6* 3.6* 3.2*  CL 103 101 102 105 103  CO2 16* 20 20 21 22   GLUCOSE 202* 312* 258* 97 81  BUN 8 8 10 7 6   CREATININE 0.47* 0.70 0.62 0.51 0.54  CALCIUM 7.5* 7.4* 7.6* 8.0* 8.2*   Urine pregnancy test negative Lactic acid 2.95, repeat 1.0 UDS positive for barbiturates (unknown), opiates (received in hospital)  CT soft tissue neck FINDINGS:  The visualized portions of the brain are within normal limits.  Orbits are not visualized. Mastoid air cells are clear.  Paranasal sinuses are clear.  The salivary glands including the parotid glands and submandibular  glands are normal in appearance and symmetric bilaterally.  The oral cavity is within normal limits without evidence of mass  lesion or loculated fluid collection. Submental region is not  included on this examination. Palatine tonsils are symmetric.  Parapharyngeal fat is preserved. The oropharynx and nasopharynx are  within normal limits. No loculated retropharyngeal fluid collection.  The hypopharynx and supraglottic larynx are normal. Epiglottis is  normal. Vallecula is clear. True vocal cords are symmetric.  Subglottic airway is normal.  Thyroid gland is within normal limits. Calcification noted within  the left thyroid lobe.  No pathologically enlarged lymph nodes are identified within the  neck. Visualized  superior mediastinum is normal.  Visualized lungs are clear.  Normal intravascular enhancement seen within the neck.  No acute osseous abnormality identified. No worrisome lytic or  blastic osseous lesions.  IMPRESSION:  Normal CT of the neck without evidence of active active inflammatory  process, cervical adenopathy, or other acute abnormality.   Results/Tests Pending at Time of Discharge: NONE  Discharge Medications:    Medication List    STOP taking these medications       cephALEXin 500 MG capsule  Commonly known as:  KEFLEX     insulin NPH-regular Human (70-30) 100 UNIT/ML injection  Commonly known as:  NOVOLIN 70/30      TAKE these medications       aspirin 81 MG EC tablet  Take 1 tablet (81 mg total) by mouth daily.     fluconazole 100 MG tablet  Commonly known as:  DIFLUCAN  Take 1 tablet (100 mg total) by mouth daily.     ibuprofen 200 MG tablet  Commonly known as:  ADVIL,MOTRIN  Take 400 mg by mouth every 6 (six) hours as needed for moderate pain.     insulin aspart 100 UNIT/ML injection  Commonly known as:  novoLOG  Inject 0-15 Units into the skin 4 (four) times daily -  with meals and at bedtime.     insulin glargine 100 UNIT/ML injection  Commonly known as:  LANTUS  Inject 0.2 mLs (20 Units total) into the skin daily.     menthol-cetylpyridinium 3 MG lozenge  Commonly known as:  CEPACOL  Take 1 lozenge by mouth as needed for sore throat.     metFORMIN 500 MG 24 hr tablet  Commonly known as:  GLUCOPHAGE-XR  Take 2 tablets (1,000 mg total) by mouth daily with breakfast.     phenol 1.4 % Liqd  Commonly known as:  CHLORASEPTIC  Use as directed 1 spray in the mouth or throat as needed for throat irritation / pain.     PROAIR HFA 108 (90 BASE) MCG/ACT inhaler  Generic drug:  albuterol  Inhale 2 puffs into the lungs 2 (two) times daily as needed for wheezing or shortness of breath.     ranitidine 150 MG tablet  Commonly known as:  ZANTAC  Take  150 mg by mouth 2 (two) times daily.        Discharge Instructions: Please refer to Patient Instructions section of EMR for full details.  Patient was counseled important signs and symptoms that should prompt return to medical care, changes in medications, dietary instructions, activity restrictions, and follow up appointments.   Follow-Up Appointments: Follow-up Information   Follow up with Tawni CarnesWight, Damani Kelemen, MD On 12/06/2013. (at 2pm, For hospital follow up)    Specialty:  Family Medicine   Contact information:   [address] [place] [address] [phone]       Tawni CarnesAndrew Rondle Lohse, MD 12/03/2013, 2:12 PM PGY-1, [facility]

## 2013-12-03 NOTE — ED Provider Notes (Signed)
I have personally seen and examined the patient.  I have discussed the plan of care with the resident.  I have reviewed the documentation on PMH/FH/Soc. History.  I have reviewed the documentation of the resident and agree.  CRITICAL CARE Performed by: Joya GaskinsWICKLINE,Laekyn Rayos W Total critical care time: 32 Critical care time was exclusive of separately billable procedures and treating other patients. Critical care was necessary to treat or prevent imminent or life-threatening deterioration. Critical care was time spent personally by me on the following activities: development of treatment plan with patient and/or surrogate as well as nursing, discussions with consultants, evaluation of patient's response to treatment, examination of patient, obtaining history from patient or surrogate, ordering and performing treatments and interventions, ordering and review of laboratory studies, ordering and review of radiographic studies, pulse oximetry and re-evaluation of patient's condition.   Pt presented for hypotension, tachycardia, found to be in DKA with significant acidosis and required IV fluids and insulin drip.  Pt stabilized in the ER and admitted to stepdown service.     Joya Gaskinsonald W Angelita Harnack, MD 12/03/13 [phone]

## 2013-12-03 NOTE — Progress Notes (Signed)
Notified Dr. Waynetta SandyWight that pt's CBG was 80 this am -- order received to hold Lantus this am. Danna HeftyGolden, Cynthia Cogle Lee

## 2013-12-03 NOTE — Discharge Instructions (Addendum)
You were admitted for DKA. While in the hospital you were started on an insulin drip until your anion gap closed, at which point you were switched over to Lantus and slide scale novolog insulin. We are sending you home with samples of lantus and novolog insulin, take as directed below.  The best source for paying for these medications would be the Medication Assistance Program ([facility]) at [facility]. You will need to go there to apply for this, the address is: [address] [place], [place] [address] [phone]  Please bring your blood sugar readings for the next several days to your appointment on Tuesday. Check at least 3 times a day (fasting, before lunch, before dinner)  Insulin Novolog Sliding Scale Instructions Blood sugar <70: eat fast acting sugar (juice, cracker, candy) Blood sugar 70-120: 0 units Blood sugar 121-150: 2 units Blood sugar 151-200: 3 units Blood sugar 201-250: 5 units Blood sugar 251-300: 8 units Blood sugar 301-350: 11 units Blood sugar 351-400: 15 units Blood sugar >400: take 15 units and called primary care doctor

## 2013-12-03 NOTE — Progress Notes (Signed)
Discharge instructions given to pt -- pt verbalized understanding of activity levels, follow-up appts and home meds, along with detailed instructions for when to take CBGs, administer insulin and how much insulin to administer. Kristine Allison, [name]

## 2013-12-04 LAB — CULTURE, GROUP A STREP

## 2013-12-04 NOTE — Progress Notes (Signed)
   CARE MANAGEMENT NOTE 12/04/2013  Patient:  Kristine Allison,Kristine Allison   Account Number:  [id number]  Date Initiated:  12/02/2013  Documentation initiated by:  GRAVES-BIGELOW,BRENDA  Subjective/Objective Assessment:   Pt admitted for Sore throat and DKA with clinical dehydration. Glucose 354 on admission, K 5.2. Initiated on glucose stabilizer.     Action/Plan:   [facility] Clinic will help pt with the MAPP Program and the [facility] will provide the pt with Lantus before she leaves. NO needs from CM at this time.   Anticipated DC Date:  12/03/2013   Anticipated DC Plan:  HOME/SELF CARE      DC Planning Services  CM consult  Medication Assistance      Choice offered to / List presented to:             Status of service:  Completed, signed off Medicare Important Message given?   (If response is "NO", the following Medicare IM given date fields will be blank) Date Medicare IM given:   Date Additional Medicare IM given:    Discharge Disposition:  HOME/SELF CARE  Per UR Regulation:  Reviewed for med. necessity/level of care/duration of stay  If discussed at Long Length of Stay Meetings, dates discussed:    Comments:  12/03/13 13:40 RN requested medication aid for pt for discharge meds.  Pt not eligible for MATCH as she used it within last calendar year.  MD ordered diflucan in a dosage NOT on the $4 list but [facility]  Samples given to pt (from pharmacy) BY RN.  [employer] handout given to pt (diabetic supplies); [facility] handout given to pt to pursue orange card (as she was not sure if she was already being set up for orange card.  No other CM needs were communicated.  Freddy JakschSarah Vidal Lampkins, BSN, CM [phone].

## 2013-12-06 ENCOUNTER — Encounter: Payer: Self-pay | Admitting: Family Medicine

## 2013-12-06 ENCOUNTER — Ambulatory Visit (INDEPENDENT_AMBULATORY_CARE_PROVIDER_SITE_OTHER): Payer: Self-pay | Admitting: Family Medicine

## 2013-12-06 VITALS — BP 98/64 | HR 112 | Temp 98.6°F | Wt 119.0 lb

## 2013-12-06 DIAGNOSIS — E119 Type 2 diabetes mellitus without complications: Secondary | ICD-10-CM

## 2013-12-06 NOTE — Patient Instructions (Signed)
Fill out the MAP application, submit it ASAP!  Call the clinic with the decision on the MAP application, if you are accepted then we will fax over prescriptions for the lantus and novolog; if you are denied we will help you get started on applying to [facility].  Please record your sugars 3 times a day in a log, bring that with you for the next appointment.

## 2013-12-06 NOTE — Assessment & Plan Note (Signed)
Recently discharged from hospital for DKA suspected from noncompliance with insulin. States she is currently taking lantus and novolog and her sugars have been under control. P: MAP application sent, patient to call once this is done and hears back from the program. Asked to check sugars 3 times daily before meals and record in log to bring to next visit. Follow up in 1 week for review of daily sugars for adjustment of regimen as needed and to make sure she has means established to pay for her insulin.

## 2013-12-06 NOTE — Progress Notes (Signed)
   Subjective:    Patient ID: Kristine Allison, female    DOB: 08/16/1981, 33 y.o.   MRN: [id number]  HPI  CC: hospital follow up  # Diabetes:  Forgot to bring daily log of sugars (says she was in a rush today with errands)  Sugar this morning 106, last night before dinner 195. Reports a few sugars in the 200s  Has been giving herself lantus 20 units, and says 10 units novologs with meals based on sliding scale (though the scale she was d/c'd with did not have any 10 listed)  Reports no hypoglycemic events  She has the MAP application but has not filled it out to bring in yet  She understands the importance of doing this ASAP ROS: no dizziness, abdominal pain, nausea/vomiting, no CP, no SOB   Review of Systems   See HPI for ROS. Objective:  BP 98/64  Pulse 112  Temp(Src) 98.6 F (37 C) (Oral)  Wt 119 lb (53.978 kg)  LMP 10/23/2013  General: NAD Cardiac: RRR, normal heart sounds, no murmurs. 2+ radial and PT pulses bilaterally Respiratory: CTAB, normal effort Abdomen: soft, nontender, nondistended, no hepatic or splenomegaly. Bowel sounds present Extremities: no edema or cyanosis. WWP. Skin: warm and dry, no rashes noted Neuro: alert and oriented, no focal deficits     Assessment & Plan:  See Problem List Documentation

## 2013-12-08 LAB — CULTURE, BLOOD (ROUTINE X 2)
CULTURE: NO GROWTH
Culture: NO GROWTH

## 2013-12-13 ENCOUNTER — Ambulatory Visit (INDEPENDENT_AMBULATORY_CARE_PROVIDER_SITE_OTHER): Payer: Self-pay | Admitting: Family Medicine

## 2013-12-13 ENCOUNTER — Encounter: Payer: Self-pay | Admitting: Family Medicine

## 2013-12-13 VITALS — BP 117/73 | HR 133 | Temp 98.3°F | Wt 115.0 lb

## 2013-12-13 DIAGNOSIS — E119 Type 2 diabetes mellitus without complications: Secondary | ICD-10-CM

## 2013-12-13 DIAGNOSIS — G43909 Migraine, unspecified, not intractable, without status migrainosus: Secondary | ICD-10-CM

## 2013-12-13 MED ORDER — SUMATRIPTAN SUCCINATE 100 MG PO TABS: 100.0000 mg | ORAL_TABLET | Freq: Once | ORAL | Status: DC | PRN

## 2013-12-13 NOTE — Patient Instructions (Signed)
Sumatriptan: this is a powerful medication that you can take to stop a migraine headache. Take 1 tablet, if your headache is still there after 3 hours you can take one additional tablet.   Please fill out the paperwork and schedule an appointment with Britta MccreedyBarbara for the [facility].  Please call us when you find out the decision about MAP.

## 2013-12-14 DIAGNOSIS — G43909 Migraine, unspecified, not intractable, without status migrainosus: Secondary | ICD-10-CM | POA: Insufficient documentation

## 2013-12-14 NOTE — Assessment & Plan Note (Signed)
Not clearly migraine per history; encouraged to increase water intake. No red flags concerning for other pathology. Patient is without insurance currently, given printed prescription for #9 sumatriptan; discussed this is an abortive medication that she should use sparingly, discussed most common side effects (flushing, chest pain, drowsiness, fatigue).

## 2013-12-14 NOTE — Assessment & Plan Note (Signed)
CBG in 100-200 range for most of last week (checking TID). She still has about 1/2 lantus and insulin samples left. She has submitted paperwork to MAP but has not heard back yet; discussed with her to [facility]. She does still have insulin 70/30 that she can use if she runs out of lantus/novolog before being able to get refills. Asked her to please call the office if she does run out of lantus/novolog.

## 2013-12-14 NOTE — Progress Notes (Signed)
Patient ID: Kristine Allison, female   DOB: 03-13-1981, 33 y.o.   MRN: [id number]   Subjective:    Patient ID: Kristine Allison, female    DOB: 03-13-1981, 33 y.o.   MRN: [id number]  HPI  CC: Leg pain, migraine  # Leg pain:  Has been present for at least several months  Both legs, worse on right, back of the legs  Feels like "squeezing", denies electric pain. It does sometimes radiate from back of knee up to hip  Denies any trauma to the area  Tylenol/aleve/ibuprofen doesn't seem to help ROS: no foot drop, no bladder/bowel incontinence  # "Migraine"  Has had for a long time  Gets them almost constantly, never goes away just "subsides" at times  Located primarily behind eyes on both sides  Tylenol, aleve, ibuprofen doesn't help  Sleeping sometimes improves it  Thinks she has been drinking enough fluids ROS: no changes in vision, dizziness, difficulty walking, arm/leg weakness  # Diabetes  Brings in log of three times a day blood sugar checks, no lows under 100, no highs above 250. Range primarily 130-180.  Has submitted paper work for MAP, will also be filling out Orange card paperwork and scheduling appt with Britta MccreedyBarbara ROS: no dizziness, lightheadedness, abdominal pain,  Review of Systems   See HPI for ROS. Objective:  BP 117/73  Pulse 133  Temp(Src) 98.3 F (36.8 C) (Oral)  Wt 115 lb (52.164 kg)  SpO2 94%  LMP 10/23/2013  General: NAD Cardiac: RRR, normal heart sounds, no murmurs. 2+ radial and PT pulses bilaterally Respiratory: CTAB, normal effort Extremities: no edema or cyanosis. WWP. Tender to palpation of back of legs primarily just superior to knee joint. SLR test causes stretching pain at 45 degrees bilaterally, but no radicular pain. Full ROM of back, flexion causes pain in back of legs Skin: warm and dry, no rashes noted Neuro: alert and oriented, strength 5/5 hip flexion, leg extension/flexion, ankle extension/flexion. Absent patellar  reflexes bilaterally. 1-2+ achilles reflexes bilat. Gait normal.     Assessment & Plan:  1. Leg pain: suspect this is musculoskeletal from tight hamstrings. No radicular/myelopathic signs. Discussed with patient to increase water intake (for possible cramping), demonstrated some stretches to work on; if pain does not start to improve to be re-evaluated.  See Problem List Documentation for additional

## 2013-12-27 ENCOUNTER — Ambulatory Visit: Payer: Self-pay | Admitting: Family Medicine

## 2014-01-13 ENCOUNTER — Telehealth: Payer: Self-pay | Admitting: Family Medicine

## 2014-01-13 NOTE — Telephone Encounter (Signed)
Pt called and would like a refill on her test strips and insulin called in. jw

## 2014-01-16 NOTE — Telephone Encounter (Signed)
[facility], so [facility].  Pt used the Ultra one touch strips.  Kiah Keay,CMA

## 2014-01-16 NOTE — Telephone Encounter (Signed)
Thanks

## 2014-01-16 NOTE — Telephone Encounter (Signed)
She has 11 refills for her insulin at [facility] - [place], [place] - [address] Remind her to call her pharmacy to confirm this. What type of Glucometer does she use?

## 2014-01-18 ENCOUNTER — Other Ambulatory Visit: Payer: Self-pay | Admitting: Family Medicine

## 2014-01-18 MED ORDER — GLUCOSE BLOOD VI STRP: ORAL_STRIP | Status: DC

## 2014-01-18 MED ORDER — ONETOUCH ULTRASOFT LANCETS MISC: Status: DC

## 2014-01-18 MED ORDER — INSULIN NPH ISOPHANE & REGULAR (70-30) 100 UNIT/ML ~~LOC~~ SUSP: SUBCUTANEOUS | Status: DC

## 2014-01-18 NOTE — Telephone Encounter (Signed)
Patient called, she need refill of Novolin 70/30. She can't afford Lantus and Novolog. She stated she is currently doing well on Novolog 10 unit am, 15 unitpm and 10 unit pm. She was previously on Novolin 20 unit am and 15 unit pm, hence this is a bump in her dose, denies any s/e of this medication regimen, feels well otherwise.

## 2014-02-21 ENCOUNTER — Telehealth: Payer: Self-pay | Admitting: Family Medicine

## 2014-02-21 NOTE — Telephone Encounter (Signed)
Please inform patient she does have refill in her pharmacy, advise to call pharmacy to confirm this.

## 2014-02-21 NOTE — Telephone Encounter (Signed)
Please send refill for Novolin 70/30 to [facility] @ [address]

## 2014-07-07 ENCOUNTER — Other Ambulatory Visit: Payer: Self-pay

## 2014-08-19 ENCOUNTER — Telehealth: Payer: Self-pay | Admitting: Family Medicine

## 2014-08-19 MED ORDER — INSULIN NPH ISOPHANE & REGULAR (70-30) 100 UNIT/ML ~~LOC~~ SUSP: SUBCUTANEOUS | Status: DC

## 2014-08-19 NOTE — Telephone Encounter (Signed)
[facility]  Patients moth calls in stating that patient has run out of insulin. She took her last dose this morning. She has type I DM and is on Novolin 70-30. Her last cbg was 359. The patients mother notes that she sleeps more than usual and vomiting when she does not take her insulin. She notes today she has been sleepier and has been vomiting as well. She has tried to drink liquids, though notes she vomits them up. I will refill her insulin given that she is a type I DM and this is a necessary medication. I advised the patients mother to have her evaluated at an urgent care or in the ED given her symptoms of sleepiness and vomiting to rule out a possible DKA. She voiced understanding.   Marikay AlarEric Jaevion Goto, MD

## 2014-08-20 ENCOUNTER — Inpatient Hospital Stay (HOSPITAL_COMMUNITY)
Admission: EM | Admit: 2014-08-20 | Discharge: 2014-08-22 | DRG: 639 | Disposition: A | Discharge status: Home / Self Care | Payer: Self-pay | Attending: Internal Medicine | Admitting: Internal Medicine

## 2014-08-20 ENCOUNTER — Encounter (HOSPITAL_COMMUNITY): Payer: Self-pay | Admitting: Emergency Medicine

## 2014-08-20 DIAGNOSIS — Z79899 Other long term (current) drug therapy: Secondary | ICD-10-CM

## 2014-08-20 DIAGNOSIS — E1010 Type 1 diabetes mellitus with ketoacidosis without coma: Principal | ICD-10-CM | POA: Diagnosis present

## 2014-08-20 DIAGNOSIS — Z794 Long term (current) use of insulin: Secondary | ICD-10-CM

## 2014-08-20 DIAGNOSIS — E860 Dehydration: Secondary | ICD-10-CM | POA: Diagnosis present

## 2014-08-20 DIAGNOSIS — J45909 Unspecified asthma, uncomplicated: Secondary | ICD-10-CM | POA: Diagnosis present

## 2014-08-20 DIAGNOSIS — Z7982 Long term (current) use of aspirin: Secondary | ICD-10-CM

## 2014-08-20 DIAGNOSIS — Z23 Encounter for immunization: Secondary | ICD-10-CM

## 2014-08-20 DIAGNOSIS — E1110 Type 2 diabetes mellitus with ketoacidosis without coma: Secondary | ICD-10-CM | POA: Insufficient documentation

## 2014-08-20 DIAGNOSIS — D72829 Elevated white blood cell count, unspecified: Secondary | ICD-10-CM | POA: Diagnosis present

## 2014-08-20 DIAGNOSIS — E876 Hypokalemia: Secondary | ICD-10-CM | POA: Diagnosis present

## 2014-08-20 DIAGNOSIS — F17210 Nicotine dependence, cigarettes, uncomplicated: Secondary | ICD-10-CM | POA: Diagnosis present

## 2014-08-20 LAB — CBG MONITORING, ED
Glucose-Capillary: 445 mg/dL — ABNORMAL HIGH (ref 70–99)
Glucose-Capillary: 291 mg/dL — ABNORMAL HIGH (ref 70–99)

## 2014-08-20 LAB — BLOOD GAS, VENOUS
Acid-base deficit: 22.4 mmol/L — ABNORMAL HIGH (ref 0.0–2.0)
Bicarbonate: 7.6 mEq/L — ABNORMAL LOW (ref 20.0–24.0)
FIO2: 0.21 %
O2 SAT: 60 %
Patient temperature: 98.4
TCO2: 7.2 mmol/L (ref 0–100)
pCO2, Ven: 26.2 mmHg — ABNORMAL LOW (ref 45.0–50.0)
pH, Ven: 7.089 — CL (ref 7.250–7.300)

## 2014-08-20 LAB — HEMOGLOBIN A1C
Hgb A1c MFr Bld: 13.6 % — ABNORMAL HIGH (ref <5.7)
Mean Plasma Glucose: 344 mg/dL — ABNORMAL HIGH (ref <117)

## 2014-08-20 LAB — URINALYSIS, ROUTINE W REFLEX MICROSCOPIC
BILIRUBIN URINE: NEGATIVE
Glucose, UA: >1000 mg/dL — AB
Nitrite: NEGATIVE
PH: 5 (ref 5.0–8.0)
Protein, ur: NEGATIVE mg/dL
SPECIFIC GRAVITY, URINE: 1.023 (ref 1.005–1.030)
Urobilinogen, UA: 0.2 mg/dL (ref 0.0–1.0)

## 2014-08-20 LAB — COMPREHENSIVE METABOLIC PANEL
ALBUMIN: 3.9 g/dL (ref 3.5–5.2)
ALT: 9 U/L (ref 0–35)
ANION GAP: 45 — AB (ref 5–15)
AST: 8 U/L (ref 0–37)
Alkaline Phosphatase: 120 U/L — ABNORMAL HIGH (ref 39–117)
BILIRUBIN TOTAL: 0.4 mg/dL (ref 0.3–1.2)
BUN: 61 mg/dL — ABNORMAL HIGH (ref 6–23)
CHLORIDE: 85 meq/L — AB (ref 96–112)
CO2: 7 mEq/L — CL (ref 19–32)
Calcium: 8.6 mg/dL (ref 8.4–10.5)
Creatinine, Ser: 1.53 mg/dL — ABNORMAL HIGH (ref 0.50–1.10)
GFR calc Af Amer: 51 mL/min — ABNORMAL LOW (ref >90)
GFR calc non Af Amer: 44 mL/min — ABNORMAL LOW (ref >90)
Glucose, Bld: 766 mg/dL (ref 70–99)
POTASSIUM: 6.3 meq/L — AB (ref 3.7–5.3)
Sodium: 137 mEq/L (ref 137–147)
TOTAL PROTEIN: 7.5 g/dL (ref 6.0–8.3)

## 2014-08-20 LAB — BASIC METABOLIC PANEL
Anion gap: 28 — ABNORMAL HIGH (ref 5–15)
Anion gap: 20 — ABNORMAL HIGH (ref 5–15)
Anion gap: 12 (ref 5–15)
BUN: 41 mg/dL — ABNORMAL HIGH (ref 6–23)
BUN: 34 mg/dL — ABNORMAL HIGH (ref 6–23)
BUN: 26 mg/dL — ABNORMAL HIGH (ref 6–23)
CALCIUM: 8.1 mg/dL — AB (ref 8.4–10.5)
CO2: 13 mEq/L — ABNORMAL LOW (ref 19–32)
CO2: 16 mEq/L — ABNORMAL LOW (ref 19–32)
CO2: 20 mEq/L (ref 19–32)
CREATININE: 0.98 mg/dL (ref 0.50–1.10)
CREATININE: 0.79 mg/dL (ref 0.50–1.10)
Calcium: 7.9 mg/dL — ABNORMAL LOW (ref 8.4–10.5)
Calcium: 8.4 mg/dL (ref 8.4–10.5)
Chloride: 103 mEq/L (ref 96–112)
Chloride: 104 mEq/L (ref 96–112)
Chloride: 107 mEq/L (ref 96–112)
Creatinine, Ser: 0.66 mg/dL (ref 0.50–1.10)
GFR calc Af Amer: >90 mL/min (ref >90)
GFR, EST AFRICAN AMERICAN: 88 mL/min — AB (ref >90)
GFR, EST NON AFRICAN AMERICAN: 76 mL/min — AB (ref >90)
GLUCOSE: 127 mg/dL — AB (ref 70–99)
Glucose, Bld: 309 mg/dL — ABNORMAL HIGH (ref 70–99)
Glucose, Bld: 247 mg/dL — ABNORMAL HIGH (ref 70–99)
POTASSIUM: 3.7 meq/L (ref 3.7–5.3)
Potassium: 5.1 mEq/L (ref 3.7–5.3)
Potassium: 4.6 mEq/L (ref 3.7–5.3)
SODIUM: 139 meq/L (ref 137–147)
Sodium: 144 mEq/L (ref 137–147)
Sodium: 140 mEq/L (ref 137–147)

## 2014-08-20 LAB — GLUCOSE, CAPILLARY
GLUCOSE-CAPILLARY: 237 mg/dL — AB (ref 70–99)
Glucose-Capillary: 271 mg/dL — ABNORMAL HIGH (ref 70–99)
Glucose-Capillary: 215 mg/dL — ABNORMAL HIGH (ref 70–99)
Glucose-Capillary: 203 mg/dL — ABNORMAL HIGH (ref 70–99)
Glucose-Capillary: 202 mg/dL — ABNORMAL HIGH (ref 70–99)
Glucose-Capillary: 174 mg/dL — ABNORMAL HIGH (ref 70–99)
Glucose-Capillary: 191 mg/dL — ABNORMAL HIGH (ref 70–99)

## 2014-08-20 LAB — PREGNANCY, URINE: Preg Test, Ur: NEGATIVE

## 2014-08-20 LAB — CBC WITH DIFFERENTIAL/PLATELET
BASOS ABS: 0 10*3/uL (ref 0.0–0.1)
Basophils Relative: 0 % (ref 0–1)
EOS PCT: 0 % (ref 0–5)
Eosinophils Absolute: 0 10*3/uL (ref 0.0–0.7)
HEMATOCRIT: 50.2 % — AB (ref 36.0–46.0)
HEMOGLOBIN: 16.7 g/dL — AB (ref 12.0–15.0)
LYMPHS PCT: 12 % (ref 12–46)
Lymphs Abs: 1.8 10*3/uL (ref 0.7–4.0)
MCH: 32.7 pg (ref 26.0–34.0)
MCHC: 33.3 g/dL (ref 30.0–36.0)
MCV: 98.2 fL (ref 78.0–100.0)
MONO ABS: 1.5 10*3/uL — AB (ref 0.1–1.0)
MONOS PCT: 9 % (ref 3–12)
NEUTROS ABS: 12.6 10*3/uL — AB (ref 1.7–7.7)
Neutrophils Relative %: 79 % — ABNORMAL HIGH (ref 43–77)
Platelets: 296 10*3/uL (ref 150–400)
RBC: 5.11 MIL/uL (ref 3.87–5.11)
RDW: 13.3 % (ref 11.5–15.5)
WBC: 15.9 10*3/uL — ABNORMAL HIGH (ref 4.0–10.5)

## 2014-08-20 LAB — BLOOD GAS, ARTERIAL
ACID-BASE DEFICIT: 14.2 mmol/L — AB (ref 0.0–2.0)
BICARBONATE: 10.9 meq/L — AB (ref 20.0–24.0)
DRAWN BY: 11249
O2 Content: 2 L/min
O2 SAT: 98.7 %
Patient temperature: 98.4
TCO2: 9.9 mmol/L (ref 0–100)
pCO2 arterial: 24 mmHg — ABNORMAL LOW (ref 35.0–45.0)
pH, Arterial: 7.279 — ABNORMAL LOW (ref 7.350–7.450)
pO2, Arterial: 130 mmHg — ABNORMAL HIGH (ref 80.0–100.0)

## 2014-08-20 LAB — MRSA PCR SCREENING: MRSA BY PCR: NEGATIVE

## 2014-08-20 LAB — LACTIC ACID, PLASMA
LACTIC ACID, VENOUS: 2.5 mmol/L — AB (ref 0.5–2.2)
Lactic Acid, Venous: 3.8 mmol/L — ABNORMAL HIGH (ref 0.5–2.2)

## 2014-08-20 LAB — URINE MICROSCOPIC-ADD ON

## 2014-08-20 MED ORDER — ACETAMINOPHEN 325 MG PO TABS
650.0000 mg | ORAL_TABLET | Freq: Four times a day (QID) | ORAL | Status: DC | PRN
  Administered 2014-08-20: 650 mg via ORAL
  Filled 2014-08-20 (×3): qty 2

## 2014-08-20 MED ORDER — INSULIN ASPART 100 UNIT/ML ~~LOC~~ SOLN
0.0000 [IU] | Freq: Three times a day (TID) | SUBCUTANEOUS | Status: DC
  Administered 2014-08-21: 1 [IU] via SUBCUTANEOUS
  Administered 2014-08-21: 3 [IU] via SUBCUTANEOUS
  Administered 2014-08-21: 5 [IU] via SUBCUTANEOUS
  Administered 2014-08-22: 3 [IU] via SUBCUTANEOUS

## 2014-08-20 MED ORDER — DEXTROSE-NACL 5-0.45 % IV SOLN
INTRAVENOUS | Status: DC
  Administered 2014-08-20 (×2): via INTRAVENOUS

## 2014-08-20 MED ORDER — SODIUM CHLORIDE 0.9 % IV SOLN: 250.0000 mL | INTRAVENOUS | Status: DC | PRN

## 2014-08-20 MED ORDER — ALBUTEROL SULFATE (2.5 MG/3ML) 0.083% IN NEBU: 2.5000 mg | INHALATION_SOLUTION | Freq: Two times a day (BID) | RESPIRATORY_TRACT | Status: DC | PRN

## 2014-08-20 MED ORDER — SODIUM CHLORIDE 0.9 % IJ SOLN
3.0000 mL | Freq: Two times a day (BID) | INTRAMUSCULAR | Status: DC
  Administered 2014-08-20 – 2014-08-22 (×4): 3 mL via INTRAVENOUS

## 2014-08-20 MED ORDER — SODIUM CHLORIDE 0.9 % IV SOLN
1000.0000 mL | Freq: Once | INTRAVENOUS | Status: AC
  Administered 2014-08-20: 1000 mL via INTRAVENOUS

## 2014-08-20 MED ORDER — ACETAMINOPHEN 650 MG RE SUPP: 650.0000 mg | Freq: Four times a day (QID) | RECTAL | Status: DC | PRN

## 2014-08-20 MED ORDER — INSULIN ASPART PROT & ASPART (70-30 MIX) 100 UNIT/ML ~~LOC~~ SUSP
15.0000 [IU] | Freq: Two times a day (BID) | SUBCUTANEOUS | Status: DC
  Administered 2014-08-21 – 2014-08-22 (×3): 15 [IU] via SUBCUTANEOUS
  Filled 2014-08-20: qty 10

## 2014-08-20 MED ORDER — SODIUM CHLORIDE 0.9 % IJ SOLN: 3.0000 mL | Freq: Two times a day (BID) | INTRAMUSCULAR | Status: DC

## 2014-08-20 MED ORDER — SODIUM CHLORIDE 0.9 % IV SOLN
1000.0000 mL | INTRAVENOUS | Status: DC
  Administered 2014-08-20: 1000 mL via INTRAVENOUS

## 2014-08-20 MED ORDER — ONDANSETRON HCL 4 MG/2ML IJ SOLN
4.0000 mg | Freq: Once | INTRAMUSCULAR | Status: AC
  Administered 2014-08-20: 4 mg via INTRAVENOUS
  Filled 2014-08-20: qty 2

## 2014-08-20 MED ORDER — SODIUM CHLORIDE 0.9 % IV SOLN
INTRAVENOUS | Status: DC
  Administered 2014-08-20: 5.4 [IU]/h via INTRAVENOUS
  Filled 2014-08-20: qty 2.5

## 2014-08-20 MED ORDER — HEPARIN SODIUM (PORCINE) 5000 UNIT/ML IJ SOLN
5000.0000 [IU] | Freq: Three times a day (TID) | INTRAMUSCULAR | Status: DC
  Administered 2014-08-20 – 2014-08-21 (×6): 5000 [IU] via SUBCUTANEOUS
  Filled 2014-08-20 (×10): qty 1

## 2014-08-20 MED ORDER — SODIUM CHLORIDE 0.9 % IV SOLN
INTRAVENOUS | Status: DC
  Administered 2014-08-20: 22:00:00 via INTRAVENOUS

## 2014-08-20 MED ORDER — SODIUM CHLORIDE 0.9 % IV SOLN
INTRAVENOUS | Status: DC
  Administered 2014-08-20: 06:00:00 via INTRAVENOUS

## 2014-08-20 MED ORDER — SODIUM CHLORIDE 0.9 % IJ SOLN: 3.0000 mL | INTRAMUSCULAR | Status: DC | PRN

## 2014-08-20 MED ORDER — DEXTROSE-NACL 5-0.45 % IV SOLN: INTRAVENOUS | Status: DC

## 2014-08-20 NOTE — ED Notes (Signed)
Bed: [id number] Expected date:  Expected time:  Means of arrival:  Comments: EMS 32yo Hyperglycemia

## 2014-08-20 NOTE — ED Provider Notes (Signed)
CSN: [id number]     Arrival date & time 08/20/14  0150 History   First MD Initiated Contact with Patient 08/20/14 0158     Chief Complaint  Patient presents with  . Hyperglycemia     (Consider location/radiation/quality/duration/timing/severity/associated sxs/prior Treatment) Patient is a 33 y.o. female presenting with hyperglycemia. The history is provided by the patient. No language interpreter was used.  Hyperglycemia Severity:  Severe Duration:  1 day Diabetes status:  Controlled with insulin Context: noncompliance and recent illness   Associated symptoms: increased thirst, nausea and vomiting   Associated symptoms: no confusion, no dysuria, no fever and no shortness of breath   Associated symptoms comment:  EMS contacted by patient's sister. The patient has been sick with flu symptoms and has not been taking her insulin for the past 2 days. Nausea, with non-bloody vomiting today. No fever. She reports a history of DKA in the past.    Past Medical History  Diagnosis Date  . Asthma   . Diabetes mellitus without complication   . NWGNFAOZ(308.6Headache(784.0)    Past Surgical History  Procedure Laterality Date  . No past surgeries     Family History  Problem Relation Age of Onset  . Heart disease      No family history   History  Substance Use Topics  . Smoking status: Current Every Day Smoker -- 0.50 packs/day for 20 years    Types: Cigarettes  . Smokeless tobacco: Current User     Comment: only smoking electronic cigs  . Alcohol Use: No   OB History    No data available     Review of Systems  Constitutional: Positive for appetite change. Negative for fever and chills.  HENT: Negative.   Eyes: Negative for visual disturbance.  Respiratory: Negative.  Negative for cough and shortness of breath.   Cardiovascular: Negative.   Gastrointestinal: Positive for nausea and vomiting.  Endocrine: Positive for polydipsia.  Genitourinary: Negative for dysuria.  Musculoskeletal:  Positive for myalgias.  Skin: Negative.  Negative for color change.  Neurological: Positive for weakness.  Psychiatric/Behavioral: Negative for confusion.      Allergies  Latex  Home Medications   Prior to Admission medications   Medication Sig Start Date End Date Taking? Authorizing Provider  albuterol (PROAIR HFA) 108 (90 BASE) MCG/ACT inhaler Inhale 2 puffs into the lungs 2 (two) times daily as needed for wheezing or shortness of breath.    Historical Provider, MD  aspirin EC 81 MG EC tablet Take 1 tablet (81 mg total) by mouth daily. 11/20/13   Nani RavensAndrew M Wight, MD  fluconazole (DIFLUCAN) 100 MG tablet Take 1 tablet (100 mg total) by mouth daily. 12/03/13   Nani RavensAndrew M Wight, MD  glucose blood (ONE TOUCH ULTRA TEST) test strip Use as instructed 01/18/14   Janit PaganKehinde Eniola, MD  ibuprofen (ADVIL,MOTRIN) 200 MG tablet Take 400 mg by mouth every 6 (six) hours as needed for moderate pain.    Historical Provider, MD  insulin NPH-regular Human (NOVOLIN 70/30) (70-30) 100 UNIT/ML injection Inject 10 unit in am,15 unit at noon and 10 unit at night with each meal. 08/19/14   Glori LuisEric G Sonnenberg, MD  Lancets [facility](ONETOUCH ULTRASOFT) lancets Use as instructed 01/18/14   Janit PaganKehinde Eniola, MD  menthol-cetylpyridinium (CEPACOL) 3 MG lozenge Take 1 lozenge by mouth as needed for sore throat.    Historical Provider, MD  metFORMIN (GLUCOPHAGE-XR) 500 MG 24 hr tablet Take 2 tablets (1,000 mg total) by mouth daily with breakfast. 11/28/13  Alexander Althea CharonKaramalegos, DO  phenol (CHLORASEPTIC) 1.4 % LIQD Use as directed 1 spray in the mouth or throat as needed for throat irritation / pain.    Historical Provider, MD  ranitidine (ZANTAC) 150 MG tablet Take 150 mg by mouth 2 (two) times daily.    Historical Provider, MD  SUMAtriptan (IMITREX) 100 MG tablet Take 1 tablet (100 mg total) by mouth Once PRN for migraine or headache. May repeat in 3 hours if headache persists or recurs. 12/13/13   Nani RavensAndrew M Wight, MD   BP 122/73 mmHg   Pulse 124  Temp(Src) 98.4 F (36.9 C) (Oral)  Resp 18  Ht 5\' 5"  (1.651 m)  Wt 103 lb (46.72 kg)  BMI 17.14 kg/m2  SpO2 100% Physical Exam  Constitutional: She is oriented to person, place, and time. She appears well-developed and well-nourished.  Cachectic. Oriented, cooperative.   HENT:  Head: Normocephalic.  Mouth/Throat: Mucous membranes are dry.  Eyes: Conjunctivae are normal.  Neck: Normal range of motion. Neck supple.  Cardiovascular: Regular rhythm.  Tachycardia present.   Pulmonary/Chest: Effort normal and breath sounds normal. She has no wheezes. She has no rales.  Abdominal: Soft. Bowel sounds are normal. There is no tenderness. There is no rebound and no guarding.  Musculoskeletal: Normal range of motion.  Neurological: She is alert and oriented to person, place, and time.  Skin: Skin is warm and dry. No rash noted. There is pallor.  Psychiatric: She has a normal mood and affect.    ED Course  Procedures (including critical care time) Labs Review Labs Reviewed  COMPREHENSIVE METABOLIC PANEL - Abnormal; Notable for the following:    Potassium 6.3 (*)    Chloride 85 (*)    CO2 7 (*)    Glucose, Bld 766 (*)    BUN 61 (*)    Creatinine, Ser 1.53 (*)    Alkaline Phosphatase 120 (*)    GFR calc non Af Amer 44 (*)    GFR calc Af Amer 51 (*)    Anion gap 45 (*)    All other components within normal limits  CBC WITH DIFFERENTIAL - Abnormal; Notable for the following:    WBC 15.9 (*)    Hemoglobin 16.7 (*)    HCT 50.2 (*)    Neutrophils Relative % 79 (*)    Neutro Abs 12.6 (*)    Monocytes Absolute 1.5 (*)    All other components within normal limits  LACTIC ACID, PLASMA - Abnormal; Notable for the following:    Lactic Acid, Venous 3.8 (*)    All other components within normal limits  BLOOD GAS, VENOUS - Abnormal; Notable for the following:    pH, Ven 7.089 (*)    pCO2, Ven 26.2 (*)    Bicarbonate 7.6 (*)    Acid-base deficit 22.4 (*)    All other components  within normal limits  CBG MONITORING, ED - Abnormal; Notable for the following:    Glucose-Capillary >600 (*)    All other components within normal limits  URINALYSIS, ROUTINE W REFLEX MICROSCOPIC  PREGNANCY, URINE  CBG MONITORING, ED    Imaging Review No results found.   EKG Interpretation None     CRITICAL CARE Performed by: Elpidio AnisUPSTILL, Nori Poland A   Total critical care time: 40  Critical care time was exclusive of separately billable procedures and treating other patients.  Critical care was necessary to treat or prevent imminent or life-threatening deterioration.  Critical care was time spent personally by me on  the following activities: development of treatment plan with patient and/or surrogate as well as nursing, discussions with consultants, evaluation of patient's response to treatment, examination of patient, obtaining history from patient or surrogate, ordering and performing treatments and interventions, ordering and review of laboratory studies, ordering and review of radiographic studies, pulse oximetry and re-evaluation of patient's condition.  MDM   Final diagnoses:  None    1. DKA  Blood sugar responds well to fluid bolus and insulin, 495 on recheck. She has received 2+ liters, has a second IV site, on glucostabilizer and is mentating well, ambulatory to the bathroom without ataxia or significant weakness. No further vomiting. Discussed admission with [facility] who accepts for the patient to stepdown unit.     Arnoldo Hooker, PA-C 08/23/14 1901  Purvis Sheffield, MD 08/25/14 Windell Moment

## 2014-08-20 NOTE — ED Notes (Signed)
Pt reports flu x 2 days, reports not taking medications for 4 days. Pt reports emesis x 6 today. Denies diarrhea. Slight improvement to nausea from Zofran given by EMS.

## 2014-08-20 NOTE — Progress Notes (Signed)
Pt admitted after midnight. Please see earlier admission note by Dr. Lucky RathkeFielding. Pt admitted with DKA. Stable this AM with VSS. Pt is still on insulin drip. AG on admission > 40, this AM AG 20. Keep in SDU until AG closes and off insulin drip. Repeat BMP this AM to follow upon AG.   Debbora PrestoMAGICK-Katricia Prehn, MD  [facility] Pager [phone]  If 7PM-7AM, please contact night-coverage [email or web address] Password [email or web address]

## 2014-08-20 NOTE — ED Notes (Signed)
Per EMS: Pt has been sick with cold since Thursday, has not gotten prescriptions filled so has not taken medications. About 3-4 hours ago CBG was 320s. EMS states their monitor read high. Pt c/o nausea, emesis x 1 today. Given 4 mg Zofran en route and about 200 mL NS. A&O x 4.

## 2014-08-20 NOTE — ED Notes (Signed)
Respiratory notified nurse of patients venous gas. Patient was put on 2 liters of oxygen

## 2014-08-20 NOTE — ED Notes (Signed)
Critical Glucose 766 Critical CO2 7  Read back and verified with Susie (lab) Primary RN and EDP notified

## 2014-08-20 NOTE — Plan of Care (Signed)
Problem: Phase I Progression Outcomes Goal: CBGs steadily decreasing on IV insulin drip Outcome: Progressing  Comments:  BS 191 @ 1200

## 2014-08-20 NOTE — H&P (Signed)
[facility] History and Physical  Crista ElliotMargie E Simmons-Cohn [id number] DOB: 20-Dec-1980 DOA: 08/20/2014  Referring physician: Elpidio AnisShari Upstill, PA-C  PCP: Janit PaganENIOLA, KEHINDE, MD   Chief Complaint: DKA  HPI: Colin InaMargie E Simmons-Cohn is a 33 y.o. female type 1 diabetic who ran out of her insulin 4 days prior to admission.  She began to feel lethargic, was urinating a lot, and this progressed until she felt she had to come to the ED.  Her mother was able to get her insulin several hours prior to coming to the ED, but pt sxs had progressed significantly by that point.  In ED found to be acidotic, with CBG value very elevated.  Bicarb very low, AG extremely high.  Pt denies any infectious sxs (no urinary sxs, no recent fever/cough/etc).  Lactic acid elevated.  Started in ED on fluids and insulin gtt.  K+ high.  Pt is arousable and appropriate.     Review of Systems:  Constitutional:  No weight loss, night sweats, Fevers, chills, fatigue.  HEENT:  No headaches, Difficulty swallowing,sore throat,  No sneezing, itching, ear ache, nasal congestion, post nasal drip,  Cardio-vascular:  No chest pain, Orthopnea, swelling in lower extremities,  GI:  No heartburn, indigestion, abdominal pain, nausea, vomiting, diarrhea, change in bowel habits, loss of appetite  Resp:  No shortness of breath with exertion or at rest. No cough, No coughing up of blood.No change in color of mucus.No wheezing.No chest wall deformity  Skin:  no rash or lesions.  GU:  no dysuria, change in color of urine, no urgency or frequency. No flank pain.  Musculoskeletal:  No joint pain or swelling. No decreased range of motion. No back pain.  Psych:  No change in mood or affect. No depression or anxiety. No memory loss.   Past Medical History  Diagnosis Date  . Asthma   . Diabetes mellitus without complication   . GMWNUUVO(536.6Headache(784.0)    Past Surgical History  Procedure Laterality Date  . No past surgeries     Social  History:  reports that she has been smoking Cigarettes.  She has a 10 pack-year smoking history. She uses smokeless tobacco. She reports that she does not drink alcohol or use illicit drugs.  Allergies  Allergen Reactions  . Latex Rash    Family History  Problem Relation Age of Onset  . Heart disease      No family history     Prior to Admission medications   Medication Sig Start Date End Date Taking? Authorizing Provider  glucose blood (ONE TOUCH ULTRA TEST) test strip Use as instructed 01/18/14  Yes Janit PaganKehinde Eniola, MD  insulin NPH-regular Human (NOVOLIN 70/30) (70-30) 100 UNIT/ML injection Inject 10 unit in am,15 unit at noon and 10 unit at night with each meal. 08/19/14  Yes Glori LuisEric G Sonnenberg, MD  Lancets [facility](ONETOUCH ULTRASOFT) lancets Use as instructed 01/18/14  Yes Janit PaganKehinde Eniola, MD  menthol-cetylpyridinium (CEPACOL) 3 MG lozenge Take 1 lozenge by mouth as needed for sore throat.   Yes Historical Provider, MD  metFORMIN (GLUCOPHAGE-XR) 500 MG 24 hr tablet Take 2 tablets (1,000 mg total) by mouth daily with breakfast. 11/28/13  Yes Saralyn PilarAlexander Karamalegos, DO  phenol (CHLORASEPTIC) 1.4 % LIQD Use as directed 1 spray in the mouth or throat as needed for throat irritation / pain.   Yes Historical Provider, MD  ranitidine (ZANTAC) 150 MG tablet Take 150 mg by mouth 2 (two) times daily.   Yes Historical Provider, MD  albuterol (PROAIR HFA) 108 (  90 BASE) MCG/ACT inhaler Inhale 2 puffs into the lungs 2 (two) times daily as needed for wheezing or shortness of breath.    Historical Provider, MD  aspirin EC 81 MG EC tablet Take 1 tablet (81 mg total) by mouth daily. Patient not taking: Reported on 08/20/2014 11/20/13   Nani Ravens, MD  fluconazole (DIFLUCAN) 100 MG tablet Take 1 tablet (100 mg total) by mouth daily. Patient not taking: Reported on 08/20/2014 12/03/13   Nani Ravens, MD  ibuprofen (ADVIL,MOTRIN) 200 MG tablet Take 400 mg by mouth every 6 (six) hours as needed for moderate pain.     Historical Provider, MD  SUMAtriptan (IMITREX) 100 MG tablet Take 1 tablet (100 mg total) by mouth Once PRN for migraine or headache. May repeat in 3 hours if headache persists or recurs. Patient not taking: Reported on 08/20/2014 12/13/13   Nani Ravens, MD   Physical Exam: Filed Vitals:   08/20/14 0153 08/20/14 0239 08/20/14 0241 08/20/14 0304  BP: 104/66   122/73  Pulse: 128   124  Temp: 98.4 F (36.9 C)   98.4 F (36.9 C)  TempSrc: Oral   Oral  Resp: 20   18  Height:  5\' 5"  (1.651 m)    Weight:  46.72 kg (103 lb)    SpO2: 98%  100% 100%    Wt Readings from Last 3 Encounters:  08/20/14 46.72 kg (103 lb)  12/13/13 52.164 kg (115 lb)  12/06/13 53.978 kg (119 lb)    General:  Appears calm in NAD Eyes: PERRL, normal lids, irises & conjunctiva ENT: grossly normal hearing, lips & tongue Neck: no LAD, masses or thyromegaly Cardiovascular: tachycardic, no m/r/g. No LE edema. Telemetry: SR, no arrhythmias  Respiratory: CTA bilaterally, no w/r/r. Normal respiratory effort. Abdomen: soft, ntnd Skin: no rash or induration seen on limited exam Musculoskeletal: grossly normal tone BUE/BLE Psychiatric: grossly normal mood and affect, speech fluent and appropriate Neurologic: grossly non-focal.          Labs on Admission:  Basic Metabolic Panel:  Recent Labs Lab 08/20/14 0215  NA 137  K 6.3*  CL 85*  CO2 7*  GLUCOSE 766*  BUN 61*  CREATININE 1.53*  CALCIUM 8.6   Liver Function Tests:  Recent Labs Lab 08/20/14 0215  AST 8  ALT 9  ALKPHOS 120*  BILITOT 0.4  PROT 7.5  ALBUMIN 3.9   No results for input(s): LIPASE, AMYLASE in the last 168 hours. No results for input(s): AMMONIA in the last 168 hours. CBC:  Recent Labs Lab 08/20/14 0215  WBC 15.9*  NEUTROABS 12.6*  HGB 16.7*  HCT 50.2*  MCV 98.2  PLT 296   Cardiac Enzymes: No results for input(s): CKTOTAL, CKMB, CKMBINDEX, TROPONINI in the last 168 hours.  BNP (last 3 results) No results for  input(s): PROBNP in the last 8760 hours. CBG:  Recent Labs Lab 08/20/14 0154 08/20/14 0353  GLUCAP >600* 445*    Radiological Exams on Admission: No results found.   Assessment/Plan Principal Problem:   DM (diabetes mellitus) type 1, uncontrolled, with ketoacidosis   1. DKA type 1 DM - likely due to running out of insulin, but given SIRS with tachycardia and elevated WBC (which is possibly hemoconcentration) will culture urine and blood.  Hold on abx for now.    - insulin gtt per protocol  - aggressive fluid hydration  - monitor lactic acid and potassium  - diet when glucose under better control and pt tolerates  Code Status: Full (must indicate code status--if unknown or must be presumed, indicate so) DVT Prophylaxis:heparin Family Communication: None Disposition Plan: admit to [facility] step down  Time spent: 40  Kristeen MissWillis, Willamina Grieshop Fielding [facility] Pager [phone]

## 2014-08-20 NOTE — ED Notes (Signed)
Requested urine from patient. 

## 2014-08-20 NOTE — Plan of Care (Signed)
Problem: Phase I Progression Outcomes Goal: NPO or per MD order Outcome: Progressing  Comments:  Pt has carb mod diet ordered, but patient has not awaken to eat this morning.

## 2014-08-20 NOTE — Progress Notes (Signed)
At 1842 this evening, writer noted MD orders to have pt transferred and stop insulin drip. Writer spoke with charge nurse, Judeth CornfieldStephanie, and oncoming nurse, Trabuco CanyonJanell, concerning pt's condition. Pt has been lethargic today, poor appetite, difficult to arouse, c/o of "all over" pain, and weak with current BP of 84/59. Oncoming nurse will monitor pt and speak with night floor coverage MD concerning transfer. Evening meal at bedside. Pt sleeping.

## 2014-08-21 LAB — GLUCOSE, CAPILLARY
GLUCOSE-CAPILLARY: 137 mg/dL — AB (ref 70–99)
GLUCOSE-CAPILLARY: 171 mg/dL — AB (ref 70–99)
Glucose-Capillary: 132 mg/dL — ABNORMAL HIGH (ref 70–99)
Glucose-Capillary: 128 mg/dL — ABNORMAL HIGH (ref 70–99)
Glucose-Capillary: 206 mg/dL — ABNORMAL HIGH (ref 70–99)
Glucose-Capillary: 276 mg/dL — ABNORMAL HIGH (ref 70–99)
Glucose-Capillary: 209 mg/dL — ABNORMAL HIGH (ref 70–99)
Glucose-Capillary: 127 mg/dL — ABNORMAL HIGH (ref 70–99)

## 2014-08-21 LAB — BASIC METABOLIC PANEL
ANION GAP: 13 (ref 5–15)
BUN: 16 mg/dL (ref 6–23)
CHLORIDE: 100 meq/L (ref 96–112)
CO2: 20 meq/L (ref 19–32)
CREATININE: 0.62 mg/dL (ref 0.50–1.10)
Calcium: 8 mg/dL — ABNORMAL LOW (ref 8.4–10.5)
GFR calc Af Amer: >90 mL/min (ref >90)
GFR calc non Af Amer: >90 mL/min (ref >90)
Glucose, Bld: 247 mg/dL — ABNORMAL HIGH (ref 70–99)
POTASSIUM: 3.6 meq/L — AB (ref 3.7–5.3)
Sodium: 133 mEq/L — ABNORMAL LOW (ref 137–147)

## 2014-08-21 LAB — CBC
HCT: 32.8 % — ABNORMAL LOW (ref 36.0–46.0)
Hemoglobin: 11.6 g/dL — ABNORMAL LOW (ref 12.0–15.0)
MCH: 31.7 pg (ref 26.0–34.0)
MCHC: 35.4 g/dL (ref 30.0–36.0)
MCV: 89.6 fL (ref 78.0–100.0)
PLATELETS: 147 10*3/uL — AB (ref 150–400)
RBC: 3.66 MIL/uL — AB (ref 3.87–5.11)
RDW: 13.6 % (ref 11.5–15.5)
WBC: 6 10*3/uL (ref 4.0–10.5)

## 2014-08-21 LAB — URINE CULTURE
Colony Count: >=100000
Special Requests: NORMAL

## 2014-08-21 MED ORDER — IBUPROFEN 200 MG PO TABS
600.0000 mg | ORAL_TABLET | Freq: Three times a day (TID) | ORAL | Status: DC | PRN
Start: 2014-08-21 — End: 2014-08-22
  Administered 2014-08-22 (×2): 600 mg via ORAL
  Filled 2014-08-21 (×2): qty 3

## 2014-08-21 MED ORDER — ONDANSETRON HCL 4 MG/2ML IJ SOLN
4.0000 mg | Freq: Once | INTRAMUSCULAR | Status: AC
  Administered 2014-08-21: 4 mg via INTRAVENOUS
  Filled 2014-08-21: qty 2

## 2014-08-21 MED ORDER — GI COCKTAIL ~~LOC~~
30.0000 mL | Freq: Once | ORAL | Status: DC
  Filled 2014-08-21: qty 30

## 2014-08-21 MED ORDER — ONDANSETRON HCL 4 MG PO TABS: 4.0000 mg | ORAL_TABLET | ORAL | Status: DC | PRN

## 2014-08-21 MED ORDER — POTASSIUM CHLORIDE CRYS ER 20 MEQ PO TBCR
40.0000 meq | EXTENDED_RELEASE_TABLET | Freq: Once | ORAL | Status: AC
  Administered 2014-08-21: 40 meq via ORAL
  Filled 2014-08-21: qty 2

## 2014-08-21 MED ORDER — SODIUM CHLORIDE 0.9 % IV BOLUS (SEPSIS)
500.0000 mL | Freq: Once | INTRAVENOUS | Status: AC
  Administered 2014-08-21: 500 mL via INTRAVENOUS

## 2014-08-21 NOTE — Plan of Care (Signed)
Problem: Phase I Progression Outcomes Goal: CBGs steadily decreasing on IV insulin drip Outcome: Completed/Met Date Met:  08/21/14 Goal: Monitor hydration status Outcome: Completed/Met Date Met:  08/21/14 Pt taking PO's well.  Drinks diet gingerale only.  IVF saline locked. Goal: Acidosis resolving Outcome: Completed/Met Date Met:  08/21/14 Goal: K+ level approaching normal with therapy Outcome: Progressing Goal: Nausea/vomiting controlled with antiemetics Outcome: Progressing Goal: Pain controlled with appropriate interventions Outcome: Completed/Met Date Met:  08/21/14 Goal: OOB as tolerated unless otherwise ordered Outcome: Completed/Met Date Met:  08/21/14 Goal: Initial discharge plan identified Outcome: Progressing Pt was initially ordered to be discharged  home today, though she stated that she did not feel well enough.  Spoke with mother on phone, she stated that usually it is is very difficult to get her to agree to coming to the hospital when she is sick,so if she has verbalized this, she truly does is not ready to go home.  This RN notified Dr. Doyle Askew, orders changed to Med-Surg transfer.  Pt mostly sleeping throughout the shift, though she is able to get OOB to [facility] without difficulty and is starting to eat more.  The pt did verbalize frustration of not being able to eat french fries and almost chose to eat nothing out of frustration.  Pt had a chicken sandwich for lunch.  One dose of Zofran 54m IV given prior to Potassium replacement this am. Goal: Voiding-avoid urinary catheter unless indicated Outcome: Completed/Met Date Met:  08/21/14 Goal: Diabetes Coordinator Consult Outcome: Completed/Met Date Met:  08/21/14 Goal: Pt. states reason for hospitalization Outcome: Completed/Met Date Met:  08/21/14 Goal: Order "Choose to Live" book from Pharmacy Outcome: Not Applicable Date Met:  132/20/19Pt has been given in the past.  Problem: Phase II Progression Outcomes Goal: CBGs stable  on SQ insulin Outcome: Completed/Met Date Met:  08/21/14 Goal: Acidosis resolved (CO2 > 20, ketones negative) Outcome: Completed/Met Date Met:  08/21/14 Goal: Monitor hydration status Outcome: Completed/Met Date Met:  08/21/14 Goal: Tolerating PO clear liquid diet Outcome: Completed/Met Date Met:  08/21/14 Goal: Potassium level normalizing Outcome: Progressing Replaced as ordered. Goal: Nausea & vomiting resolved Outcome: Progressing Goal: Progress activity as tolerated unless otherwise ordered Outcome: Progressing Goal: Discharge plan established Outcome: Progressing

## 2014-08-21 NOTE — Progress Notes (Signed)
Patient ID: Kristine ElliotMargie E Allison, female   DOB: 06/22/1981, 33 y.o.   MRN: [id number]  [facility] PROGRESS NOTE  Kristine ElliotMargie E Allison [id number] DOB: 06/22/1981 DOA: 08/20/2014 PCP: Janit PaganENIOLA, KEHINDE, MD  Brief narrative: 33 y.o. female type 1 diabetic who ran out of her insulin 4 days prior to admission. She began to feel lethargic, was urinating a lot, and this progressed until she felt she had to come to the ED. Her mother was able to get her insulin several hours prior to coming to the ED, but pt sxs had progressed significantly by that point. In ED found to be acidotic, with CBG value very elevated. Bicarb very low, AG extremely high ~ 40. Pt denied any infectious sxs (no urinary sxs, no recent fever/cough/etc). Lactic acid elevated. Started in ED on fluids and insulin gtt.  Assessment and Plan:    Principal Problem:   DM (diabetes mellitus) type 1, uncontrolled, with ketoacidosis - secondary to lack of insulin administration - no clear infectious etiology - AG closed and pt has been off insulin drip since 11/29 - tolerating diet well - continue to provide insulin per home medical regimen - antiemetics as needed    Hypokalemia - supplement and repeat BMP in AM   Leukocytosis - likely reactive - no signs of an infectious etiology, WBC is WNL this AM - no need for ABX at this time   Dehydration  - from DKA, also noted hemoconcentration on admission - now Hg ~11, no signs of active bleeding - repeat CBC in AM  DVT prophylaxis  Heparin SQ while pt is in hospital  Code Status: Full Family Communication: Pt at bedside Disposition Plan: Home when medically stable  IV Access:   Peripheral IV Procedures and diagnostic studies:    No results found.  Medical Consultants:   None  Anti-Infectives:   None   Debbora PrestoMAGICK-Lucius Wise, MD  [facility] Pager [phone]  If 7PM-7AM, please contact night-coverage [email or web address] Password [email or web address] 08/21/2014, 3:51 PM   LOS: 1  day   HPI/Subjective: No events overnight.   Objective: Filed Vitals:   08/21/14 0827 08/21/14 1200 08/21/14 1313 08/21/14 1400  BP: 127/87  106/73 114/84  Pulse: 78     Temp:  98.7 F (37.1 C)    TempSrc:  Oral    Resp: 17  8 14   Height:      Weight:      SpO2: 99%       Intake/Output Summary (Last 24 hours) at 08/21/14 1551 Last data filed at 08/21/14 1400  Gross per 24 hour  Intake   3588 ml  Output    875 ml  Net   2713 ml    Exam:   General:  Pt is alert, follows commands appropriately, not in acute distress  Cardiovascular: Regular rate and rhythm, S1/S2, no murmurs, no rubs, no gallops  Respiratory: Clear to auscultation bilaterally, no wheezing, no crackles, no rhonchi  Abdomen: Soft, non tender, non distended, bowel sounds present, no guarding  Extremities: No edema, pulses DP and PT palpable bilaterally  Neuro: Grossly nonfocal  Data Reviewed: Basic Metabolic Panel:  Recent Labs Lab 08/20/14 0215 08/20/14 0655 08/20/14 0959 08/20/14 1323 08/21/14 0350  NA 137 144 140 139 133*  K 6.3* 5.1 4.6 3.7 3.6*  CL 85* 103 104 107 100  CO2 7* 13* 16* 20 20  GLUCOSE 766* 309* 247* 127* 247*  BUN 61* 41* 34* 26* 16  CREATININE 1.53* 0.98 0.79 0.66 0.62  CALCIUM 8.6  7.9* 8.1* 8.4 8.0*   Liver Function Tests:  Recent Labs Lab 08/20/14 0215  AST 8  ALT 9  ALKPHOS 120*  BILITOT 0.4  PROT 7.5  ALBUMIN 3.9   CBC:  Recent Labs Lab 08/20/14 0215 08/21/14 0350  WBC 15.9* 6.0  NEUTROABS 12.6*  --   HGB 16.7* 11.6*  HCT 50.2* 32.8*  MCV 98.2 89.6  PLT 296 147*   CBG:  Recent Labs Lab 08/20/14 1719 08/20/14 1819 08/20/14 1926 08/20/14 2043 08/20/14 2159  GLUCAP 132* 128* 137* 171* 206*    Recent Results (from the past 240 hour(s))  Urine culture     Status: None   Collection Time: 08/20/14  4:00 AM  Result Value Ref Range Status   Specimen Description URINE, CLEAN CATCH  Final   Special Requests Normal  Final   Culture  Setup  Time   Final    08/20/2014 13:05 Performed at [facility]    Colony Count   Final    >=100,000 COLONIES/ML Performed at [facility]    Culture   Final    LACTOBACILLUS SPECIES Note: Standardized susceptibility testing for this organism is not available. Performed at [facility]    Report Status 08/21/2014 FINAL  Final  MRSA PCR Screening     Status: None   Collection Time: 08/20/14  5:40 AM  Result Value Ref Range Status   MRSA by PCR NEGATIVE NEGATIVE Final    Comment:        The GeneXpert MRSA Assay (FDA approved for NASAL specimens only), is one component of a comprehensive MRSA colonization surveillance program. It is not intended to diagnose MRSA infection nor to guide or monitor treatment for MRSA infections.      Scheduled Meds: . heparin  5,000 Units Subcutaneous 3 times per day  . insulin aspart  0-9 Units Subcutaneous TID WC  . insulin aspart protamine- aspart  15 Units Subcutaneous BID WC  . sodium chloride  3 mL Intravenous Q12H   Continuous Infusions:

## 2014-08-21 NOTE — Progress Notes (Signed)
[facility], NP informed of pt low b/p and lethargy.  New orders received and initiated.

## 2014-08-21 NOTE — Care Management Note (Signed)
    Page 1 of 1   08/21/2014     10:40:49 AM CARE MANAGEMENT NOTE 08/21/2014  Patient:  Crista ElliotSIMMONS-COHN,Ondrea E   Account Number:  [id number]  Date Initiated:  08/21/2014  Documentation initiated by:  Rayni Nemitz  Subjective/Objective Assessment:   hypoglycemia and requring iv insulin     Action/Plan:   home when stable   Anticipated DC Date:  08/22/2014   Anticipated DC Plan:  HOME/SELF CARE  In-house referral  NA      DC Planning Services  CM consult      PAC Choice  NA   Choice offered to / List presented to:  NA      DME agency  NA        HH agency  NA   Status of service:  In process, will continue to follow Medicare Important Message given?   (If response is "NO", the following Medicare IM given date fields will be blank) Date Medicare IM given:   Medicare IM given by:   Date Additional Medicare IM given:   Additional Medicare IM given by:    Discharge Disposition:    Per UR Regulation:  Reviewed for med. necessity/level of care/duration of stay  If discussed at Long Length of Stay Meetings, dates discussed:    Comments:  [id number]/Murvin Gift Earlene Plateravis, RN, BSN, CCM Chart reviewed. Discharge needs and patient's stay to be reviewed and followed by case manager.

## 2014-08-21 NOTE — Progress Notes (Signed)
Attempted to complete admission required documentation. Upon greeting patient, patient advised that she had a headache, didn't want to answer any questions at this time and that it could wait until the morning.  Nickola MajorHans Alisia Vanengen,RN,BSN,CCRN.

## 2014-08-21 NOTE — Discharge Instructions (Signed)
Diabetic Ketoacidosis °Diabetic ketoacidosis (DKA) is a life-threatening complication of type 1 diabetes. It must be quickly recognized and treated. Treatment requires hospitalization. °CAUSES  °When there is no insulin in the body, glucose (sugar) cannot be used, and the body breaks down fat for energy. When fat breaks down, acids (ketones) build up in the blood. Very high levels of glucose and high levels of acids lead to severe loss of body fluids (dehydration) and other dangerous chemical changes. This stresses your vital organs and can cause coma or death. °SIGNS AND SYMPTOMS  °· Tiredness (fatigue). °· Weight loss. °· Excessive thirst. °· Ketones in your urine. °· Light-headedness. °· Fruity or sweet smelling breath. °· Excessive urination. °· Visual changes. °· Confusion or irritability. °· Nausea or vomiting. °· Rapid breathing. °· Stomachache or abdominal pain. °DIAGNOSIS  °Your health care provider will diagnose DKA based on your history, physical exam, and blood tests. The health care provider will check to see if you have another illness that caused you to go into DKA. Most of this will be done quickly in an emergency room. °TREATMENT  °· Fluid replacement to correct dehydration. °· Insulin. °· Correction of electrolytes, such as potassium and sodium. °· Antibiotic medicines. °PREVENTION °· Always take your insulin. Do not skip your insulin injections. °· If you are sick, treat yourself quickly. Your body often needs more insulin to fight the illness. °· Check your blood glucose regularly. °· Check urine ketones if your blood glucose is greater than 240 milligrams per deciliter (mg/dL). °· Do not use outdated (expired) insulin. °· If your blood glucose is high, drink plenty of fluids. This helps flush out ketones. °HOME CARE INSTRUCTIONS  °· If you are sick, follow the advice of your health care provider. °· To prevent dehydration, drink enough water and fluids to keep your urine clear or pale  yellow. °¨ If you cannot eat, alternate between drinking fluids with sugar (soda, juices, flavored gelatin) and salty fluids (broth, bouillon). °¨ If you can eat, follow your usual diet and drink sugar-free liquids (water, diet drinks). °· Always take your usual dose of insulin. If you cannot eat or if your glucose is getting too low, call your health care provider for further instructions. °· Continue to monitor your blood or urine ketones every 3-4 hours around the clock. Set your alarm clock or have someone wake you up. If you are too sick, have someone test it for you. °· Rest and avoid exercise. °SEEK MEDICAL CARE IF:  °· You have a fever. °· You have ketones in your urine, or your blood glucose is higher than a level your health care provider suggests. You may need extra insulin. Call your health care provider if you need advice on adjusting your insulin. °· You cannot drink at least a tablespoon (15 mL) of fluid every 15-20 minutes. °· You have been vomiting for more than 2 hours. °· You have symptoms of DKA: °¨ Fruity smelling breath. °¨ Breathing faster or slower. °¨ Becoming very sleepy. °SEEK IMMEDIATE MEDICAL CARE IF:  °· You have signs of dehydration: °¨ Decreased urination. °¨ Increased thirst. °¨ Dry skin and mouth. °¨ Light-headedness. °· Your blood glucose is very high (as advised by your health care provider) twice in a row. °· You faint. °· You have chest pain or trouble breathing. °· You have a sudden, severe headache. °· You have sudden weakness in one arm or one leg. °· You have sudden trouble speaking or swallowing. °· You   have vomiting or diarrhea that is getting worse after 3 hours. °· You have abdominal pain. °MAKE SURE YOU:  °· Understand these instructions. °· Will watch your condition. °· Will get help right away if you are not doing well or get worse. °Document Released: 09/05/2000 Document Revised: 09/13/2013 Document Reviewed: 03/14/2009 °ExitCare® Patient Information ©2015 [employer]  [employer]. This information is not intended to replace advice given to you by your health care provider. Make sure you discuss any questions you have with your health care provider. ° °

## 2014-08-22 LAB — BASIC METABOLIC PANEL
Anion gap: 11 (ref 5–15)
BUN: 16 mg/dL (ref 6–23)
CHLORIDE: 99 meq/L (ref 96–112)
CO2: 24 meq/L (ref 19–32)
CREATININE: 0.63 mg/dL (ref 0.50–1.10)
Calcium: 8.2 mg/dL — ABNORMAL LOW (ref 8.4–10.5)
GFR calc non Af Amer: >90 mL/min (ref >90)
GLUCOSE: 236 mg/dL — AB (ref 70–99)
POTASSIUM: 3.6 meq/L — AB (ref 3.7–5.3)
Sodium: 134 mEq/L — ABNORMAL LOW (ref 137–147)

## 2014-08-22 LAB — GLUCOSE, CAPILLARY
GLUCOSE-CAPILLARY: 115 mg/dL — AB (ref 70–99)
GLUCOSE-CAPILLARY: 98 mg/dL (ref 70–99)
GLUCOSE-CAPILLARY: 101 mg/dL — AB (ref 70–99)
GLUCOSE-CAPILLARY: 99 mg/dL (ref 70–99)
Glucose-Capillary: 92 mg/dL (ref 70–99)
Glucose-Capillary: 90 mg/dL (ref 70–99)
Glucose-Capillary: 239 mg/dL — ABNORMAL HIGH (ref 70–99)

## 2014-08-22 LAB — CBC
HEMATOCRIT: 32.7 % — AB (ref 36.0–46.0)
Hemoglobin: 11.3 g/dL — ABNORMAL LOW (ref 12.0–15.0)
MCH: 31.6 pg (ref 26.0–34.0)
MCHC: 34.6 g/dL (ref 30.0–36.0)
MCV: 91.3 fL (ref 78.0–100.0)
Platelets: 126 10*3/uL — ABNORMAL LOW (ref 150–400)
RBC: 3.58 MIL/uL — AB (ref 3.87–5.11)
RDW: 13.6 % (ref 11.5–15.5)
WBC: 4.2 10*3/uL (ref 4.0–10.5)

## 2014-08-22 MED ORDER — INFLUENZA VAC SPLIT QUAD 0.5 ML IM SUSY
0.5000 mL | PREFILLED_SYRINGE | Freq: Once | INTRAMUSCULAR | Status: AC
  Administered 2014-08-22: 0.5 mL via INTRAMUSCULAR
  Filled 2014-08-22 (×2): qty 0.5

## 2014-08-22 MED ORDER — ONDANSETRON HCL 4 MG PO TABS: 4.0000 mg | ORAL_TABLET | ORAL | Status: DC | PRN

## 2014-08-22 MED ORDER — KETOROLAC TROMETHAMINE 30 MG/ML IJ SOLN
30.0000 mg | Freq: Once | INTRAMUSCULAR | Status: AC
  Administered 2014-08-22: 30 mg via INTRAVENOUS
  Filled 2014-08-22: qty 1

## 2014-08-22 MED ORDER — POTASSIUM CHLORIDE CRYS ER 20 MEQ PO TBCR
40.0000 meq | EXTENDED_RELEASE_TABLET | Freq: Once | ORAL | Status: AC
  Administered 2014-08-22: 40 meq via ORAL
  Filled 2014-08-22: qty 2

## 2014-08-22 NOTE — Care Management Note (Signed)
CARE MANAGEMENT NOTE 08/22/2014  Patient:  Crista ElliotSIMMONS-COHN,Doyle E   Account Number:  [id number]  Date Initiated:  08/21/2014  Documentation initiated by:  DAVIS,RHONDA  Subjective/Objective Assessment:   hypoglycemia and requring iv insulin     Action/Plan:   home when stable   Anticipated DC Date:  08/22/2014   Anticipated DC Plan:  HOME/SELF CARE  In-house referral  NA      DC Planning Services  CM consult      PAC Choice  NA   Choice offered to / List presented to:  NA      DME agency  NA        HH agency  NA   Status of service:  In process, will continue to follow Medicare Important Message given?   (If response is "NO", the following Medicare IM given date fields will be blank) Date Medicare IM given:   Medicare IM given by:   Date Additional Medicare IM given:   Additional Medicare IM given by:    Discharge Disposition:    Per UR Regulation:  Reviewed for med. necessity/level of care/duration of stay  If discussed at Long Length of Stay Meetings, dates discussed:    Comments:  08/22/14 Sandford Crazeora Massa Pe RN,BSN,NCM [phone] CM Consult for PCP.  Pt states she has a PCP but lives in [place] and needs one there.  Pt given list of PCPs for pt to follow up with.  Went over pts DC meds with her and gave her out of pocket pricing for meds.  Pt states that she will be able to afford the meds she is going home on.  Pt given [employer] website for the future to help find low cost meds.  Also gave her website to help apply for insurance.  Pt states she is going to apply for Medicaid again as well.  Pt appreciative of CM assistance.  [id number]/Rhonda Earlene Plateravis, RN, BSN, CCM Chart reviewed. Discharge needs and patient's stay to be reviewed and followed by case manager.

## 2014-08-22 NOTE — Discharge Summary (Signed)
Physician Discharge Summary  Kristine Allison ZOX:[id number] DOB: Apr 09, 1981 DOA: 08/20/2014  PCP: Janit Pagan, MD  Admit date: 08/20/2014 Discharge date: 08/22/2014  Recommendations for Outpatient Follow-up:  1. Pt will need to follow up with PCP in 2-3 weeks post discharge 2. Please obtain BMP to evaluate electrolytes and kidney function 3. Please also check CBC to evaluate Hg and Hct levels  Discharge Diagnoses:  Principal Problem:   DM (diabetes mellitus) type 1, uncontrolled, with ketoacidosis  Discharge Condition: Stable  Diet recommendation: Heart healthy diet discussed in details   Brief narrative: 33 y.o. female type 1 diabetic who ran out of her insulin 4 days prior to admission. She began to feel lethargic, was urinating a lot, and this progressed until she felt she had to come to the ED. Her mother was able to get her insulin several hours prior to coming to the ED, but pt sxs had progressed significantly by that point. In ED found to be acidotic, with CBG value very elevated. Bicarb very low, AG extremely high ~ 40. Pt denied any infectious sxs (no urinary sxs, no recent fever/cough/etc). Lactic acid elevated. Started in ED on fluids and insulin gtt.  Assessment and Plan:    Principal Problem:  DM (diabetes mellitus) type 1, uncontrolled, with ketoacidosis - secondary to lack of insulin administration - no clear infectious etiology - AG closed and pt has been off insulin drip since 11/29 - tolerating diet well - continue to provide insulin per home medical regimen - antiemetics as needed   Hypokalemia - supplemented prior to discharge with K-dur 40 MEQ PO  Leukocytosis - likely reactive - no signs of an infectious etiology, WBC is WNL this AM - no need for ABX at this time  Dehydration  - from DKA, also noted hemoconcentration on admission - now Hg ~11, no signs of active bleeding  DVT prophylaxis  Heparin SQ while pt is in  hospital  Code Status: Full Family Communication: Pt at bedside Disposition Plan: Home   IV Access:    Peripheral IV Medical Consultants:    None  Anti-Infectives:    None  Discharge Exam: Filed Vitals:   08/22/14 0141  BP: 101/72  Pulse: 71  Temp: 98.2 F (36.8 C)  Resp: 24   Filed Vitals:   08/21/14 2000 08/21/14 2339 08/22/14 0000 08/22/14 0141  BP: 99/74 100/55  101/72  Pulse:    71  Temp: 98.2 F (36.8 C)  98.9 F (37.2 C) 98.2 F (36.8 C)  TempSrc: Oral  Oral Oral  Resp: 12 10  24   Height:    5\' 5"  (1.651 m)  Weight:    51.8 kg (114 lb 3.2 oz)  SpO2: 98% 98%  100%    General: Pt is alert, follows commands appropriately, not in acute distress Cardiovascular: Regular rate and rhythm, S1/S2 +, no murmurs, no rubs, no gallops Respiratory: Clear to auscultation bilaterally, no wheezing, no crackles, no rhonchi Abdominal: Soft, non tender, non distended, bowel sounds +, no guarding Extremities: no edema, no cyanosis, pulses palpable bilaterally DP and PT Neuro: Grossly nonfocal  Discharge Instructions  Discharge Instructions    Diet - low sodium heart healthy    Complete by:  As directed      Diet - low sodium heart healthy    Complete by:  As directed      Increase activity slowly    Complete by:  As directed      Increase activity slowly  Complete by:  As directed             Medication List    TAKE these medications        aspirin 81 MG EC tablet  Take 1 tablet (81 mg total) by mouth daily.     fluconazole 100 MG tablet  Commonly known as:  DIFLUCAN  Take 1 tablet (100 mg total) by mouth daily.     glucose blood test strip  Commonly known as:  ONE TOUCH ULTRA TEST  Use as instructed     ibuprofen 200 MG tablet  Commonly known as:  ADVIL,MOTRIN  Take 400 mg by mouth every 6 (six) hours as needed for moderate pain.     insulin NPH-regular Human (70-30) 100 UNIT/ML injection  Commonly known as:  NOVOLIN 70/30  Inject 10 unit  in am,15 unit at noon and 10 unit at night with each meal.     menthol-cetylpyridinium 3 MG lozenge  Commonly known as:  CEPACOL  Take 1 lozenge by mouth as needed for sore throat.     metFORMIN 500 MG 24 hr tablet  Commonly known as:  GLUCOPHAGE-XR  Take 2 tablets (1,000 mg total) by mouth daily with breakfast.     ondansetron 4 MG tablet  Commonly known as:  ZOFRAN  Take 1 tablet (4 mg total) by mouth every 4 (four) hours as needed for nausea or vomiting.     onetouch ultrasoft lancets  Use as instructed     phenol 1.4 % Liqd  Commonly known as:  CHLORASEPTIC  Use as directed 1 spray in the mouth or throat as needed for throat irritation / pain.     PROAIR HFA 108 (90 BASE) MCG/ACT inhaler  Generic drug:  albuterol  Inhale 2 puffs into the lungs 2 (two) times daily as needed for wheezing or shortness of breath.     ranitidine 150 MG tablet  Commonly known as:  ZANTAC  Take 150 mg by mouth 2 (two) times daily.     SUMAtriptan 100 MG tablet  Commonly known as:  IMITREX  Take 1 tablet (100 mg total) by mouth Once PRN for migraine or headache. May repeat in 3 hours if headache persists or recurs.           Follow-up Information    Follow up with Janit PaganENIOLA, KEHINDE, MD.   Specialty:  Family Medicine   Contact information:   [address] [place] [address] [phone]       Follow up with Debbora PrestoMAGICK-Samantha Ragen, MD.   Specialty:  Internal Medicine   Why:  As needed call my cell phone [phone]   Contact information:   [address] [place] [address] [phone]        The results of significant diagnostics from this hospitalization (including imaging, microbiology, ancillary and laboratory) are listed below for reference.     Microbiology: Recent Results (from the past 240 hour(s))  Urine culture     Status: None   Collection Time: 08/20/14  4:00 AM  Result Value Ref Range Status   Specimen Description URINE, CLEAN CATCH   Final   Special Requests Normal  Final   Culture  Setup Time   Final    08/20/2014 13:05 Performed at [facility]    Colony Count   Final    >=100,000 COLONIES/ML Performed at [facility]    Culture   Final    LACTOBACILLUS SPECIES Note: Standardized susceptibility  testing for this organism is not available. Performed at [facility]    Report Status 08/21/2014 FINAL  Final  MRSA PCR Screening     Status: None   Collection Time: 08/20/14  5:40 AM  Result Value Ref Range Status   MRSA by PCR NEGATIVE NEGATIVE Final    Comment:        The GeneXpert MRSA Assay (FDA approved for NASAL specimens only), is one component of a comprehensive MRSA colonization surveillance program. It is not intended to diagnose MRSA infection nor to guide or monitor treatment for MRSA infections.      Labs: Basic Metabolic Panel:  Recent Labs Lab 08/20/14 0655 08/20/14 0959 08/20/14 1323 08/21/14 0350 08/22/14 0355  NA 144 140 139 133* 134*  K 5.1 4.6 3.7 3.6* 3.6*  CL 103 104 107 100 99  CO2 13* 16* 20 20 24   GLUCOSE 309* 247* 127* 247* 236*  BUN 41* 34* 26* 16 16  CREATININE 0.98 0.79 0.66 0.62 0.63  CALCIUM 7.9* 8.1* 8.4 8.0* 8.2*   Liver Function Tests:  Recent Labs Lab 08/20/14 0215  AST 8  ALT 9  ALKPHOS 120*  BILITOT 0.4  PROT 7.5  ALBUMIN 3.9   No results for input(s): LIPASE, AMYLASE in the last 168 hours. No results for input(s): AMMONIA in the last 168 hours. CBC:  Recent Labs Lab 08/20/14 0215 08/21/14 0350 08/22/14 0355  WBC 15.9* 6.0 4.2  NEUTROABS 12.6*  --   --   HGB 16.7* 11.6* 11.3*  HCT 50.2* 32.8* 32.7*  MCV 98.2 89.6 91.3  PLT 296 147* 126*   Cardiac Enzymes: No results for input(s): CKTOTAL, CKMB, CKMBINDEX, TROPONINI in the last 168 hours. BNP: BNP (last 3 results) No results for input(s): PROBNP in the last 8760 hours. CBG:  Recent Labs Lab 08/21/14 0801 08/21/14 1220 08/21/14 1604 08/21/14 2203  08/22/14 0822  GLUCAP 276* 209* 127* 101* 239*     SIGNED: Time coordinating discharge: Over 30 minutes  Debbora PrestoMAGICK-Jiayi Lengacher, MD  [facility] 08/22/2014, 11:06 AM Pager [phone]  If 7PM-7AM, please contact night-coverage [email or web address] Password [email or web address]

## 2014-08-22 NOTE — Plan of Care (Signed)
Problem: Phase I Progression Outcomes Goal: NPO or per MD order Outcome: Completed/Met Date Met:  08/22/14 Goal: K+ level approaching normal with therapy Outcome: Progressing Goal: Nausea/vomiting controlled with antiemetics Outcome: Completed/Met Date Met:  08/22/14 Goal: Initial discharge plan identified Outcome: Completed/Met Date Met:  08/22/14  Problem: Phase II Progression Outcomes Goal: Nausea & vomiting resolved Outcome: Completed/Met Date Met:  08/22/14 Goal: Discharge plan established Outcome: Progressing     

## 2014-09-08 ENCOUNTER — Ambulatory Visit (INDEPENDENT_AMBULATORY_CARE_PROVIDER_SITE_OTHER): Payer: Self-pay | Admitting: Family Medicine

## 2014-09-08 ENCOUNTER — Encounter: Payer: Self-pay | Admitting: Family Medicine

## 2014-09-08 VITALS — BP 111/70 | HR 101 | Temp 97.9°F | Ht 65.0 in | Wt 123.0 lb

## 2014-09-08 DIAGNOSIS — E109 Type 1 diabetes mellitus without complications: Secondary | ICD-10-CM

## 2014-09-08 DIAGNOSIS — R42 Dizziness and giddiness: Secondary | ICD-10-CM | POA: Insufficient documentation

## 2014-09-08 DIAGNOSIS — F32A Depression, unspecified: Secondary | ICD-10-CM

## 2014-09-08 DIAGNOSIS — G4700 Insomnia, unspecified: Secondary | ICD-10-CM | POA: Insufficient documentation

## 2014-09-08 DIAGNOSIS — F329 Major depressive disorder, single episode, unspecified: Secondary | ICD-10-CM

## 2014-09-08 LAB — CBC WITH DIFFERENTIAL/PLATELET
BASOS PCT: 0 % (ref 0–1)
Basophils Absolute: 0 10*3/uL (ref 0.0–0.1)
EOS ABS: 0.2 10*3/uL (ref 0.0–0.7)
Eosinophils Relative: 3 % (ref 0–5)
HEMATOCRIT: 36.7 % (ref 36.0–46.0)
Hemoglobin: 12.2 g/dL (ref 12.0–15.0)
Lymphocytes Relative: 28 % (ref 12–46)
Lymphs Abs: 1.8 10*3/uL (ref 0.7–4.0)
MCH: 32.3 pg (ref 26.0–34.0)
MCHC: 33.2 g/dL (ref 30.0–36.0)
MCV: 97.1 fL (ref 78.0–100.0)
MONO ABS: 0.5 10*3/uL (ref 0.1–1.0)
MONOS PCT: 7 % (ref 3–12)
MPV: 8.7 fL — ABNORMAL LOW (ref 9.4–12.4)
Neutro Abs: 4.1 10*3/uL (ref 1.7–7.7)
Neutrophils Relative %: 62 % (ref 43–77)
Platelets: 258 10*3/uL (ref 150–400)
RBC: 3.78 MIL/uL — ABNORMAL LOW (ref 3.87–5.11)
RDW: 15.4 % (ref 11.5–15.5)
WBC: 6.6 10*3/uL (ref 4.0–10.5)

## 2014-09-08 LAB — BASIC METABOLIC PANEL
BUN: 18 mg/dL (ref 6–23)
CALCIUM: 8.5 mg/dL (ref 8.4–10.5)
CO2: 25 mEq/L (ref 19–32)
Chloride: 93 mEq/L — ABNORMAL LOW (ref 96–112)
Creat: 0.68 mg/dL (ref 0.50–1.10)
Glucose, Bld: 431 mg/dL — ABNORMAL HIGH (ref 70–99)
Potassium: 4.2 mEq/L (ref 3.5–5.3)
Sodium: 130 mEq/L — ABNORMAL LOW (ref 135–145)

## 2014-09-08 LAB — TSH: TSH: 3.823 u[IU]/mL (ref 0.350–4.500)

## 2014-09-08 MED ORDER — ONETOUCH ULTRASOFT LANCETS MISC: Status: DC

## 2014-09-08 MED ORDER — GLUCOSE BLOOD VI STRP: ORAL_STRIP | Status: DC

## 2014-09-08 MED ORDER — METFORMIN HCL ER 500 MG PO TB24: 1000.0000 mg | ORAL_TABLET | Freq: Every day | ORAL | Status: DC

## 2014-09-08 MED ORDER — TRAZODONE HCL 50 MG PO TABS: 50.0000 mg | ORAL_TABLET | Freq: Two times a day (BID) | ORAL | Status: DC

## 2014-09-08 MED ORDER — INSULIN NPH ISOPHANE & REGULAR (70-30) 100 UNIT/ML ~~LOC~~ SUSP: SUBCUTANEOUS | Status: DC

## 2014-09-08 NOTE — Assessment & Plan Note (Signed)
I started her on Trazodone 50 mg BID today. She stated she feels she had taken this in the past which might have helped. I will reassess her in 4 wks for improvement. Return precaution given. Not currently suicidal.

## 2014-09-08 NOTE — Assessment & Plan Note (Signed)
Likely related to her depression. Trazodone prescribed which can help with both depression and insomnia.

## 2014-09-08 NOTE — Patient Instructions (Addendum)
It was nice seeing you today, I am glad you feel batter after hospital discharge. Per your Insulin regimen, I think you mad a great jump from Novolin 10, 10, 15 unit with each meal to 15, 15 and 25. I am concern about risk for hypoglycemia. Please take Novolin 15 unit TID instead. Check your glucose regularly, if low, hold insulin and call us. I will like for you to start Trazodone which will help with your depression and sleep problem. Please see me back in 4 wks.    Insomnia Insomnia is frequent trouble falling and/or staying asleep. Insomnia can be a long term problem or a short term problem. Both are common. Insomnia can be a short term problem when the wakefulness is related to a certain stress or worry. Long term insomnia is often related to ongoing stress during waking hours and/or poor sleeping habits. Overtime, sleep deprivation itself can make the problem worse. Every little thing feels more severe because you are overtired and your ability to cope is decreased. CAUSES   Stress, anxiety, and depression.  Poor sleeping habits.  Distractions such as TV in the bedroom.  Naps close to bedtime.  Engaging in emotionally charged conversations before bed.  Technical reading before sleep.  Alcohol and other sedatives. They may make the problem worse. They can hurt normal sleep patterns and normal dream activity.  Stimulants such as caffeine for several hours prior to bedtime.  Pain syndromes and shortness of breath can cause insomnia.  Exercise late at night.  Changing time zones may cause sleeping problems (jet lag). It is sometimes helpful to have someone observe your sleeping patterns. They should look for periods of not breathing during the night (sleep apnea). They should also look to see how long those periods last. If you live alone or observers are uncertain, you can also be observed at a sleep clinic where your sleep patterns will be professionally monitored. Sleep apnea  requires a checkup and treatment. Give your caregivers your medical history. Give your caregivers observations your family has made about your sleep.  SYMPTOMS   Not feeling rested in the morning.  Anxiety and restlessness at bedtime.  Difficulty falling and staying asleep. TREATMENT   Your caregiver may prescribe treatment for an underlying medical disorders. Your caregiver can give advice or help if you are using alcohol or other drugs for self-medication. Treatment of underlying problems will usually eliminate insomnia problems.  Medications can be prescribed for short time use. They are generally not recommended for lengthy use.  Over-the-counter sleep medicines are not recommended for lengthy use. They can be habit forming.  You can promote easier sleeping by making lifestyle changes such as:  Using relaxation techniques that help with breathing and reduce muscle tension.  Exercising earlier in the day.  Changing your diet and the time of your last meal. No night time snacks.  Establish a regular time to go to bed.  Counseling can help with stressful problems and worry.  Soothing music and white noise may be helpful if there are background noises you cannot remove.  Stop tedious detailed work at least one hour before bedtime. HOME CARE INSTRUCTIONS   Keep a diary. Inform your caregiver about your progress. This includes any medication side effects. See your caregiver regularly. Take note of:  Times when you are asleep.  Times when you are awake during the night.  The quality of your sleep.  How you feel the next day. This information will help your caregiver  care for you.  Get out of bed if you are still awake after 15 minutes. Read or do some quiet activity. Keep the lights down. Wait until you feel sleepy and go back to bed.  Keep regular sleeping and waking hours. Avoid naps.  Exercise regularly.  Avoid distractions at bedtime. Distractions include watching  television or engaging in any intense or detailed activity like attempting to balance the household checkbook.  Develop a bedtime ritual. Keep a familiar routine of bathing, brushing your teeth, climbing into bed at the same time each night, listening to soothing music. Routines increase the success of falling to sleep faster.  Use relaxation techniques. This can be using breathing and muscle tension release routines. It can also include visualizing peaceful scenes. You can also help control troubling or intruding thoughts by keeping your mind occupied with boring or repetitive thoughts like the old concept of counting sheep. You can make it more creative like imagining planting one beautiful flower after another in your backyard garden.  During your day, work to eliminate stress. When this is not possible use some of the previous suggestions to help reduce the anxiety that accompanies stressful situations. MAKE SURE YOU:   Understand these instructions.  Will watch your condition.  Will get help right away if you are not doing well or get worse. Document Released: 09/05/2000 Document Revised: 12/01/2011 Document Reviewed: 10/06/2007 [facility] Patient Information 2015 [employer] [place]. This information is not intended to replace advice given to you by your health care provider. Make sure you discuss any questions you have with your health care provider.

## 2014-09-08 NOTE — Assessment & Plan Note (Signed)
Uncertain at this time if she has type I or II DM. I recommended C-peptide or antibody testing to distinguish between the two to ensure we have her on correct medications. She will hold off on this since she does not have insurance. Bmet checked today to reassess her electrolyte s/p hospital discharge. I recommended Novolin 15 unit TID instead of 15 unit AM and PM and 25 unit QHS. She agreed with plan. I refilled glucometer kits to check CBG at home. F/U in 4 wks for reassessment.

## 2014-09-08 NOTE — Progress Notes (Signed)
Subjective:     Patient ID: Kristine Allison, female   DOB: 1981/05/03, 33 y.o.   MRN: [id number]  HPI  DM:Here for follow up after hospital admission, she was on Novolin 10 unit AM, 10 unit PM and 15 unit qhs, she stopped taking them at some point and ended in the hospital.Since she left the hospital she has been taking 15 unit AM and PM and 25 unit qhs, she went up based on her glucose checked at home, she denies any hypoglycemic symptoms but stated at times she feels dizzy. She does not check her glucose regularly now that she is out of test strip. Insomnia: Unisom help her relax to sleep and will get only 2 hrs of sleep. Worsening over the last few months getting worse. Depression:Patient still having depressive symptoms, feels like she is ready to get started on medication, denies suicidal ideation although she thinks about it once in a while. She does not want to die neither does she want to hurt anyone. Dizziness: Feels dizzy at times mostly whenever she gets up from sitting position, denies any fall, no LOC, there is associated headache whenever she is dizzy as well. No blood loss. Feels ok now.  Current Outpatient Prescriptions on File Prior to Visit  Medication Sig Dispense Refill  . aspirin EC 81 MG EC tablet Take 1 tablet (81 mg total) by mouth daily. 30 tablet 11  . albuterol (PROAIR HFA) 108 (90 BASE) MCG/ACT inhaler Inhale 2 puffs into the lungs 2 (two) times daily as needed for wheezing or shortness of breath.    Marland Kitchen. ibuprofen (ADVIL,MOTRIN) 200 MG tablet Take 400 mg by mouth every 6 (six) hours as needed for moderate pain.    Marland Kitchen. menthol-cetylpyridinium (CEPACOL) 3 MG lozenge Take 1 lozenge by mouth as needed for sore throat.    . ondansetron (ZOFRAN) 4 MG tablet Take 1 tablet (4 mg total) by mouth every 4 (four) hours as needed for nausea or vomiting. (Patient not taking: Reported on 09/08/2014) 20 tablet 0  . phenol (CHLORASEPTIC) 1.4 % LIQD Use as directed 1 spray in the mouth or  throat as needed for throat irritation / pain.    . ranitidine (ZANTAC) 150 MG tablet Take 150 mg by mouth 2 (two) times daily.    . SUMAtriptan (IMITREX) 100 MG tablet Take 1 tablet (100 mg total) by mouth Once PRN for migraine or headache. May repeat in 3 hours if headache persists or recurs. (Patient not taking: Reported on 08/20/2014) 9 tablet 0   No current facility-administered medications on file prior to visit.   Past Medical History  Diagnosis Date  . Asthma   . Diabetes mellitus without complication   . Headache(784.0)        Review of Systems  Constitutional: Negative for fever.  Respiratory: Negative.   Cardiovascular: Negative.   Gastrointestinal: Negative.   Genitourinary: Negative.   Neurological: Positive for dizziness.  All other systems reviewed and are negative.  Filed Vitals:   09/08/14 1001  BP: 111/70  Pulse: 101  Temp: 97.9 F (36.6 C)  TempSrc: Oral  Height: 5\' 5"  (1.651 m)  Weight: 123 lb (55.792 kg)        Objective:   Physical Exam  Constitutional: She is oriented to person, place, and time. She appears well-developed. No distress.  Cardiovascular: Normal rate, regular rhythm, normal heart sounds and intact distal pulses.   No murmur heard. Pulmonary/Chest: Effort normal and breath sounds normal. No respiratory distress. She  has no wheezes.  Abdominal: Soft. Bowel sounds are normal. She exhibits no distension and no mass. There is no tenderness.  Musculoskeletal: Normal range of motion. She exhibits no edema.  Neurological: She is alert and oriented to person, place, and time. No cranial nerve deficit.  Psychiatric: Her speech is normal and behavior is normal. Judgment normal. Thought content is not delusional. Cognition and memory are normal. She exhibits a depressed mood. She expresses no homicidal and no suicidal ideation. She expresses no suicidal plans and no homicidal plans.  Nursing note and vitals reviewed.      Assessment:      DM Insomnia Depression  Dizziness     Plan:     Check problem list.

## 2014-09-08 NOTE — Assessment & Plan Note (Signed)
Orthostatic BP measurement negative. ?? Anemia. I rechecked CBC today. ?? Hypoglycemia. I recommended regular CBG check at home. ?? Vertigo Pregnancy unlikely, last pregnancy test 2 wks ago was negative. I will call with CBC result, might benefit from iron pills.

## 2014-09-11 ENCOUNTER — Telehealth: Payer: Self-pay | Admitting: Family Medicine

## 2014-09-11 NOTE — Telephone Encounter (Signed)
I spoke with patient about lab result with elevated serum glucose, she stated she didn't use her insulin that morning. She is currently on 15 unit Novolin TID, had not picked up her glucometer yet but denies hypoglycemic symptoms. I suggested she pick up her glucometer so she can monitor her CBG at home. If still elevated >300 despite regimen I recommended adding 5 unit to her night dose, i.e 15, 15 and 20 units, and gradually increase dose by 5 units daily if still higher than 300. She verbalized understanding.

## 2014-09-11 NOTE — Telephone Encounter (Signed)
Glucose level was elevated from last visit, unclear if she was off her insulin that morning. I tried to reach her many times with no success in doing so. During last visit I recommended frequent home CBG check and to call if any concern. I will see her back in Jan if not able to reach her. Return precaution was given during her last visit.

## 2014-09-11 NOTE — Telephone Encounter (Signed)
Pt called back.  She was taking insulin that AM and she has not picked up her glucometer yet so she has not been testing her sugars. [name], Kristine Allison

## 2014-09-11 NOTE — Telephone Encounter (Signed)
Thanks Shanda BumpsJessica.  I just spoke with patient, she stated she didn't take her insulin that morning her lab was checked.

## 2014-10-06 ENCOUNTER — Ambulatory Visit: Payer: Self-pay | Admitting: Family Medicine

## 2014-10-25 ENCOUNTER — Other Ambulatory Visit: Payer: Self-pay | Admitting: Family Medicine

## 2014-10-25 MED ORDER — INSULIN NPH ISOPHANE & REGULAR (70-30) 100 UNIT/ML ~~LOC~~ SUSP: SUBCUTANEOUS | Status: DC

## 2014-10-25 NOTE — Telephone Encounter (Signed)
Pt called and needs a refill on her insulin. jw °

## 2014-10-31 ENCOUNTER — Other Ambulatory Visit: Payer: Self-pay | Admitting: Family Medicine

## 2014-10-31 MED ORDER — TRAZODONE HCL 50 MG PO TABS: 50.0000 mg | ORAL_TABLET | Freq: Two times a day (BID) | ORAL | Status: DC

## 2014-10-31 NOTE — Telephone Encounter (Signed)
Need refill on trazadone [facility]

## 2014-11-03 ENCOUNTER — Ambulatory Visit (INDEPENDENT_AMBULATORY_CARE_PROVIDER_SITE_OTHER): Payer: Self-pay | Admitting: Family Medicine

## 2014-11-03 ENCOUNTER — Encounter: Payer: Self-pay | Admitting: Family Medicine

## 2014-11-03 VITALS — BP 102/72 | HR 112 | Temp 98.3°F | Ht 64.0 in | Wt 130.0 lb

## 2014-11-03 DIAGNOSIS — E138 Other specified diabetes mellitus with unspecified complications: Secondary | ICD-10-CM

## 2014-11-03 DIAGNOSIS — R000 Tachycardia, unspecified: Secondary | ICD-10-CM | POA: Insufficient documentation

## 2014-11-03 LAB — POCT GLYCOSYLATED HEMOGLOBIN (HGB A1C): Hemoglobin A1C: 12.7

## 2014-11-03 NOTE — Assessment & Plan Note (Signed)
Compliant with regimen. A1C improved just a little from last visit. I recommended increasing Novolog to 20 unit in am and pm and 15 unit at night. She is instructed to obtain $10 glucometer from [facility].  F/U in 2-3 months for reassessment. Call if having low glucose less than 70. She agreed with plan.

## 2014-11-03 NOTE — Assessment & Plan Note (Signed)
Asymptomatic. TSH recently checked was normal. Previous EKG showed sinus tachycardia. ?? Associated with vigorous activity prior to visit. Patient advised to return next Monday for reassessment. Unable to start beta-blocker since previous HR has been normal and her BP is low normal. Patient advised to call or go to the ED if having chest pain, SOB or palpitations. I will see her on Monday for recheck, if persistent will repeat EKG and likely obtain Holter monitoring.

## 2014-11-03 NOTE — Progress Notes (Signed)
Subjective:     Patient ID: Kristine Allison, female   DOB: 1981/01/10, 34 y.o.   MRN: [id number]  HPI  EX:[id number]:Compliant with Novolog 15 unit TID, she denies hypoglycemic symptoms, she has not been checking her glucose since her meter broke. Depression:Trazodone is helping with her depression, she feels a lot more better, denies suicidal ideation. Tachycardia:HR up this morning, she stated she has been doing some running around at home this morning prior to coming to the clinic. She denies any palpitations, no chest pain, no SOB.  . Current Outpatient Prescriptions on File Prior to Visit  Medication Sig Dispense Refill  . insulin NPH-regular Human (NOVOLIN 70/30) (70-30) 100 UNIT/ML injection Inject 15 unit in am,15 unit at noon and 15 unit at night with each meal. Hold if CBG is less than 70 10 mL 4  . metFORMIN (GLUCOPHAGE-XR) 500 MG 24 hr tablet Take 2 tablets (1,000 mg total) by mouth daily with breakfast. 60 tablet 4  . traZODone (DESYREL) 50 MG tablet Take 1 tablet (50 mg total) by mouth 2 (two) times daily. 60 tablet 4  . albuterol (PROAIR HFA) 108 (90 BASE) MCG/ACT inhaler Inhale 2 puffs into the lungs 2 (two) times daily as needed for wheezing or shortness of breath.    Marland Kitchen. aspirin EC 81 MG EC tablet Take 1 tablet (81 mg total) by mouth daily. 30 tablet 11  . glucose blood (ONE TOUCH ULTRA TEST) test strip Use as instructed (Patient not taking: Reported on 11/03/2014) 100 each 12  . ibuprofen (ADVIL,MOTRIN) 200 MG tablet Take 400 mg by mouth every 6 (six) hours as needed for moderate pain.    . Lancets (ONETOUCH ULTRASOFT) lancets Use as instructed 100 each 12  . menthol-cetylpyridinium (CEPACOL) 3 MG lozenge Take 1 lozenge by mouth as needed for sore throat.    . phenol (CHLORASEPTIC) 1.4 % LIQD Use as directed 1 spray in the mouth or throat as needed for throat irritation / pain.    . ranitidine (ZANTAC) 150 MG tablet Take 150 mg by mouth 2 (two) times daily.     No current  facility-administered medications on file prior to visit.   Past Medical History  Diagnosis Date  . Asthma   . Diabetes mellitus without complication   . Headache(784.0)      Review of Systems  Respiratory: Negative.   Cardiovascular: Negative.  Negative for chest pain and palpitations.  Gastrointestinal: Negative.   Genitourinary: Negative.   All other systems reviewed and are negative.  Filed Vitals:   11/03/14 1142  BP: 102/72  Pulse: 112  Temp: 98.3 F (36.8 C)  TempSrc: Oral  Height: 5\' 4"  (1.626 m)  Weight: 130 lb (58.968 kg)       Objective:   Physical Exam  Constitutional: She is oriented to person, place, and time. She appears well-developed. No distress.  Cardiovascular: Normal rate, regular rhythm, normal heart sounds and intact distal pulses.   No murmur heard. Pulmonary/Chest: Effort normal and breath sounds normal. No respiratory distress. She has no wheezes.  Abdominal: Soft. Bowel sounds are normal. She exhibits no distension and no mass. There is no tenderness.  Musculoskeletal: Normal range of motion. She exhibits no edema.  Neurological: She is alert and oriented to person, place, and time.  Psychiatric: She has a normal mood and affect. Her behavior is normal. Judgment and thought content normal. She expresses no homicidal and no suicidal ideation.  Nursing note and vitals reviewed.  Assessment:     DM: Depression: Tachycardia:    Plan:     Check problem list.

## 2014-11-03 NOTE — Patient Instructions (Signed)
It was nice seeing you today, your A1C improved to 12.7 i.e only by one point. I think we should go up on your Insulin to 20 unit, 20units in the pm and 15 unit at night. Please get a $10 glucometer at [facility] to check your glucose. If lower than 70 please hold your insulin and give us a call. I will chak on you in the next 2-3 wks to see how you are doing.

## 2014-11-14 ENCOUNTER — Telehealth: Payer: Self-pay | Admitting: *Deleted

## 2014-11-14 ENCOUNTER — Ambulatory Visit: Payer: Self-pay | Admitting: Family Medicine

## 2014-11-14 NOTE — Telephone Encounter (Signed)
Called patient and spoke with her mom.  She will have her call tomorrow and schedule an appt.  Jazmin Hartsell,CMA

## 2014-11-14 NOTE — Telephone Encounter (Signed)
-----   Message from Janit PaganKehinde Eniola, MD sent at 11/14/2014 11:18 AM EST ----- Please contact patient to schedule nurse visit to check HR this week.

## 2014-12-21 ENCOUNTER — Encounter (HOSPITAL_COMMUNITY): Payer: Self-pay

## 2014-12-21 ENCOUNTER — Inpatient Hospital Stay (HOSPITAL_COMMUNITY)
Admission: EM | Admit: 2014-12-21 | Discharge: 2014-12-23 | DRG: 637 | Disposition: A | Discharge status: Home / Self Care | Payer: Self-pay | Attending: Internal Medicine | Admitting: Internal Medicine

## 2014-12-21 ENCOUNTER — Emergency Department (HOSPITAL_COMMUNITY): Payer: Self-pay

## 2014-12-21 DIAGNOSIS — N179 Acute kidney failure, unspecified: Secondary | ICD-10-CM

## 2014-12-21 DIAGNOSIS — E1011 Type 1 diabetes mellitus with ketoacidosis with coma: Secondary | ICD-10-CM

## 2014-12-21 DIAGNOSIS — E875 Hyperkalemia: Secondary | ICD-10-CM

## 2014-12-21 DIAGNOSIS — Z7982 Long term (current) use of aspirin: Secondary | ICD-10-CM

## 2014-12-21 DIAGNOSIS — E8729 Other acidosis: Secondary | ICD-10-CM

## 2014-12-21 DIAGNOSIS — F17210 Nicotine dependence, cigarettes, uncomplicated: Secondary | ICD-10-CM | POA: Diagnosis present

## 2014-12-21 DIAGNOSIS — E861 Hypovolemia: Secondary | ICD-10-CM | POA: Diagnosis present

## 2014-12-21 DIAGNOSIS — Z9119 Patient's noncompliance with other medical treatment and regimen: Secondary | ICD-10-CM | POA: Diagnosis present

## 2014-12-21 DIAGNOSIS — E872 Acidosis: Secondary | ICD-10-CM | POA: Insufficient documentation

## 2014-12-21 DIAGNOSIS — D72829 Elevated white blood cell count, unspecified: Secondary | ICD-10-CM | POA: Diagnosis present

## 2014-12-21 DIAGNOSIS — Z9114 Patient's other noncompliance with medication regimen: Secondary | ICD-10-CM | POA: Diagnosis present

## 2014-12-21 DIAGNOSIS — R000 Tachycardia, unspecified: Secondary | ICD-10-CM | POA: Diagnosis present

## 2014-12-21 DIAGNOSIS — Z794 Long term (current) use of insulin: Secondary | ICD-10-CM

## 2014-12-21 DIAGNOSIS — Z91040 Latex allergy status: Secondary | ICD-10-CM

## 2014-12-21 DIAGNOSIS — E860 Dehydration: Secondary | ICD-10-CM | POA: Diagnosis present

## 2014-12-21 DIAGNOSIS — E1010 Type 1 diabetes mellitus with ketoacidosis without coma: Principal | ICD-10-CM | POA: Diagnosis present

## 2014-12-21 DIAGNOSIS — G9340 Encephalopathy, unspecified: Secondary | ICD-10-CM | POA: Diagnosis present

## 2014-12-21 DIAGNOSIS — J45909 Unspecified asthma, uncomplicated: Secondary | ICD-10-CM | POA: Diagnosis present

## 2014-12-21 DIAGNOSIS — E876 Hypokalemia: Secondary | ICD-10-CM | POA: Diagnosis present

## 2014-12-21 DIAGNOSIS — E1110 Type 2 diabetes mellitus with ketoacidosis without coma: Secondary | ICD-10-CM | POA: Diagnosis present

## 2014-12-21 LAB — COMPREHENSIVE METABOLIC PANEL
ALK PHOS: 151 U/L — AB (ref 39–117)
ALT: 19 U/L (ref 0–35)
ANION GAP: 35 — AB (ref 5–15)
AST: 20 U/L (ref 0–37)
Albumin: 3.5 g/dL (ref 3.5–5.2)
BUN: 31 mg/dL — AB (ref 6–23)
CO2: 6 mmol/L — CL (ref 19–32)
Calcium: 9.5 mg/dL (ref 8.4–10.5)
Chloride: 96 mmol/L (ref 96–112)
Creatinine, Ser: 2.3 mg/dL — ABNORMAL HIGH (ref 0.50–1.10)
GFR, EST AFRICAN AMERICAN: 31 mL/min — AB (ref >90)
GFR, EST NON AFRICAN AMERICAN: 27 mL/min — AB (ref >90)
GLUCOSE: 709 mg/dL — AB (ref 70–99)
POTASSIUM: 6.5 mmol/L — AB (ref 3.5–5.1)
Sodium: 137 mmol/L (ref 135–145)
TOTAL PROTEIN: 7.5 g/dL (ref 6.0–8.3)
Total Bilirubin: 2 mg/dL — ABNORMAL HIGH (ref 0.3–1.2)

## 2014-12-21 LAB — BASIC METABOLIC PANEL
ANION GAP: 15 (ref 5–15)
Anion gap: 27 — ABNORMAL HIGH (ref 5–15)
Anion gap: 21 — ABNORMAL HIGH (ref 5–15)
Anion gap: 9 (ref 5–15)
BUN: 33 mg/dL — ABNORMAL HIGH (ref 6–23)
BUN: 27 mg/dL — ABNORMAL HIGH (ref 6–23)
BUN: 20 mg/dL (ref 6–23)
BUN: 15 mg/dL (ref 6–23)
CALCIUM: 7.8 mg/dL — AB (ref 8.4–10.5)
CALCIUM: 8 mg/dL — AB (ref 8.4–10.5)
CALCIUM: 7.2 mg/dL — AB (ref 8.4–10.5)
CHLORIDE: 115 mmol/L — AB (ref 96–112)
CO2: 7 mmol/L — AB (ref 19–32)
CO2: 10 mmol/L — CL (ref 19–32)
CO2: 14 mmol/L — ABNORMAL LOW (ref 19–32)
CO2: 16 mmol/L — ABNORMAL LOW (ref 19–32)
CREATININE: 1.86 mg/dL — AB (ref 0.50–1.10)
CREATININE: 0.87 mg/dL (ref 0.50–1.10)
Calcium: 7.8 mg/dL — ABNORMAL LOW (ref 8.4–10.5)
Chloride: 109 mmol/L (ref 96–112)
Chloride: 114 mmol/L — ABNORMAL HIGH (ref 96–112)
Chloride: 121 mmol/L — ABNORMAL HIGH (ref 96–112)
Creatinine, Ser: 1.99 mg/dL — ABNORMAL HIGH (ref 0.50–1.10)
Creatinine, Ser: 1.31 mg/dL — ABNORMAL HIGH (ref 0.50–1.10)
GFR calc Af Amer: 37 mL/min — ABNORMAL LOW (ref >90)
GFR calc Af Amer: 40 mL/min — ABNORMAL LOW (ref >90)
GFR calc non Af Amer: 32 mL/min — ABNORMAL LOW (ref >90)
GFR calc non Af Amer: 35 mL/min — ABNORMAL LOW (ref >90)
GFR calc non Af Amer: 53 mL/min — ABNORMAL LOW (ref >90)
GFR, EST AFRICAN AMERICAN: 62 mL/min — AB (ref >90)
GFR, EST NON AFRICAN AMERICAN: 87 mL/min — AB (ref >90)
GLUCOSE: 559 mg/dL — AB (ref 70–99)
GLUCOSE: 133 mg/dL — AB (ref 70–99)
Glucose, Bld: 300 mg/dL — ABNORMAL HIGH (ref 70–99)
Glucose, Bld: 186 mg/dL — ABNORMAL HIGH (ref 70–99)
POTASSIUM: 4.4 mmol/L (ref 3.5–5.1)
Potassium: 5.8 mmol/L — ABNORMAL HIGH (ref 3.5–5.1)
Potassium: 4.8 mmol/L (ref 3.5–5.1)
Potassium: 4.2 mmol/L (ref 3.5–5.1)
Sodium: 143 mmol/L (ref 135–145)
Sodium: 146 mmol/L — ABNORMAL HIGH (ref 135–145)
Sodium: 143 mmol/L (ref 135–145)
Sodium: 146 mmol/L — ABNORMAL HIGH (ref 135–145)

## 2014-12-21 LAB — CBC WITH DIFFERENTIAL/PLATELET
Basophils Absolute: 0.2 10*3/uL — ABNORMAL HIGH (ref 0.0–0.1)
Basophils Relative: 1 % (ref 0–1)
EOS ABS: 0 10*3/uL (ref 0.0–0.7)
Eosinophils Relative: 0 % (ref 0–5)
HCT: 46.1 % — ABNORMAL HIGH (ref 36.0–46.0)
HEMOGLOBIN: 14.7 g/dL (ref 12.0–15.0)
LYMPHS PCT: 12 % (ref 12–46)
Lymphs Abs: 2.2 10*3/uL (ref 0.7–4.0)
MCH: 33.8 pg (ref 26.0–34.0)
MCHC: 31.9 g/dL (ref 30.0–36.0)
MCV: 106 fL — AB (ref 78.0–100.0)
Monocytes Absolute: 1.3 10*3/uL — ABNORMAL HIGH (ref 0.1–1.0)
Monocytes Relative: 7 % (ref 3–12)
NEUTROS PCT: 80 % — AB (ref 43–77)
Neutro Abs: 15 10*3/uL — ABNORMAL HIGH (ref 1.7–7.7)
Platelets: 467 10*3/uL — ABNORMAL HIGH (ref 150–400)
RBC: 4.35 MIL/uL (ref 3.87–5.11)
RDW: 14.1 % (ref 11.5–15.5)
WBC MORPHOLOGY: INCREASED
WBC: 18.7 10*3/uL — ABNORMAL HIGH (ref 4.0–10.5)

## 2014-12-21 LAB — URINE MICROSCOPIC-ADD ON

## 2014-12-21 LAB — I-STAT CG4 LACTIC ACID, ED: Lactic Acid, Venous: 3.71 mmol/L (ref 0.5–2.0)

## 2014-12-21 LAB — GLUCOSE, CAPILLARY: GLUCOSE-CAPILLARY: 320 mg/dL — AB (ref 70–99)

## 2014-12-21 LAB — RAPID URINE DRUG SCREEN, HOSP PERFORMED
Amphetamines: NOT DETECTED
BENZODIAZEPINES: NOT DETECTED
Barbiturates: NOT DETECTED
COCAINE: NOT DETECTED
Opiates: NOT DETECTED
TETRAHYDROCANNABINOL: NOT DETECTED

## 2014-12-21 LAB — CBG MONITORING, ED
GLUCOSE-CAPILLARY: 565 mg/dL — AB (ref 70–99)
GLUCOSE-CAPILLARY: 371 mg/dL — AB (ref 70–99)
Glucose-Capillary: 499 mg/dL — ABNORMAL HIGH (ref 70–99)

## 2014-12-21 LAB — URINALYSIS, ROUTINE W REFLEX MICROSCOPIC
Leukocytes, UA: NEGATIVE
Nitrite: NEGATIVE
PROTEIN: 30 mg/dL — AB
Specific Gravity, Urine: 1.02 (ref 1.005–1.030)
Urobilinogen, UA: 0.2 mg/dL (ref 0.0–1.0)
pH: 5 (ref 5.0–8.0)

## 2014-12-21 LAB — POC URINE PREG, ED: Preg Test, Ur: NEGATIVE

## 2014-12-21 LAB — I-STAT VENOUS BLOOD GAS, ED
Acid-base deficit: 27 mmol/L — ABNORMAL HIGH (ref 0.0–2.0)
BICARBONATE: 3.4 meq/L — AB (ref 20.0–24.0)
O2 Saturation: 93 %
PH VEN: 6.974 — AB (ref 7.250–7.300)
pCO2, Ven: 14.4 mmHg — ABNORMAL LOW (ref 45.0–50.0)
pO2, Ven: 100 mmHg — ABNORMAL HIGH (ref 30.0–45.0)

## 2014-12-21 LAB — MAGNESIUM: Magnesium: 2.2 mg/dL (ref 1.5–2.5)

## 2014-12-21 LAB — MRSA PCR SCREENING: MRSA by PCR: NEGATIVE

## 2014-12-21 LAB — LACTIC ACID, PLASMA: Lactic Acid, Venous: 1.4 mmol/L (ref 0.5–2.0)

## 2014-12-21 LAB — PHOSPHORUS: Phosphorus: 8.7 mg/dL — ABNORMAL HIGH (ref 2.3–4.6)

## 2014-12-21 LAB — BETA-HYDROXYBUTYRIC ACID: Beta-Hydroxybutyric Acid: >8 mmol/L — ABNORMAL HIGH (ref 0.05–0.27)

## 2014-12-21 MED ORDER — SODIUM CHLORIDE 0.9 % IV SOLN
1000.0000 mL | Freq: Once | INTRAVENOUS | Status: AC
  Administered 2014-12-21: 1000 mL via INTRAVENOUS

## 2014-12-21 MED ORDER — ONDANSETRON HCL 4 MG/2ML IJ SOLN: 4.0000 mg | Freq: Four times a day (QID) | INTRAMUSCULAR | Status: DC | PRN

## 2014-12-21 MED ORDER — SODIUM CHLORIDE 0.9 % IV BOLUS (SEPSIS)
500.0000 mL | Freq: Once | INTRAVENOUS | Status: AC
  Administered 2014-12-21: 500 mL via INTRAVENOUS

## 2014-12-21 MED ORDER — HEPARIN SODIUM (PORCINE) 5000 UNIT/ML IJ SOLN
5000.0000 [IU] | Freq: Three times a day (TID) | INTRAMUSCULAR | Status: DC
Start: 2014-12-21 — End: 2014-12-23
  Administered 2014-12-21 – 2014-12-23 (×6): 5000 [IU] via SUBCUTANEOUS
  Filled 2014-12-21 (×8): qty 1

## 2014-12-21 MED ORDER — SODIUM BICARBONATE 8.4 % IV SOLN
50.0000 meq | Freq: Once | INTRAVENOUS | Status: DC
  Filled 2014-12-21: qty 50

## 2014-12-21 MED ORDER — SODIUM CHLORIDE 0.9 % IV SOLN
1000.0000 mL | INTRAVENOUS | Status: DC
  Administered 2014-12-21: 1000 mL via INTRAVENOUS

## 2014-12-21 MED ORDER — ACETAMINOPHEN 325 MG PO TABS
650.0000 mg | ORAL_TABLET | Freq: Four times a day (QID) | ORAL | Status: DC | PRN
  Administered 2014-12-21 – 2014-12-23 (×4): 650 mg via ORAL
  Filled 2014-12-21 (×4): qty 2

## 2014-12-21 MED ORDER — DEXTROSE-NACL 5-0.45 % IV SOLN: INTRAVENOUS | Status: DC

## 2014-12-21 MED ORDER — SODIUM BICARBONATE 8.4 % IV SOLN
25.0000 meq | Freq: Once | INTRAVENOUS | Status: AC
  Administered 2014-12-21: 25 meq via INTRAVENOUS

## 2014-12-21 MED ORDER — SODIUM CHLORIDE 0.9 % IV SOLN
INTRAVENOUS | Status: DC
  Administered 2014-12-21: 09:00:00 via INTRAVENOUS

## 2014-12-21 MED ORDER — DEXTROSE-NACL 5-0.45 % IV SOLN
INTRAVENOUS | Status: DC
  Administered 2014-12-21 – 2014-12-22 (×2): via INTRAVENOUS

## 2014-12-21 MED ORDER — SODIUM CHLORIDE 0.9 % IV BOLUS (SEPSIS)
1000.0000 mL | Freq: Once | INTRAVENOUS | Status: AC
  Administered 2014-12-21: 1000 mL via INTRAVENOUS

## 2014-12-21 MED ORDER — INSULIN REGULAR HUMAN 100 UNIT/ML IJ SOLN
INTRAMUSCULAR | Status: DC
  Administered 2014-12-21: 8.8 [IU]/h via INTRAVENOUS
  Filled 2014-12-21: qty 2.5

## 2014-12-21 MED ORDER — INSULIN REGULAR HUMAN 100 UNIT/ML IJ SOLN
INTRAMUSCULAR | Status: DC
  Administered 2014-12-21: 5.1 [IU]/h via INTRAVENOUS
  Filled 2014-12-21: qty 2.5

## 2014-12-21 MED ORDER — PANTOPRAZOLE SODIUM 40 MG IV SOLR
40.0000 mg | INTRAVENOUS | Status: DC
  Administered 2014-12-21 – 2014-12-22 (×2): 40 mg via INTRAVENOUS
  Filled 2014-12-21 (×3): qty 40

## 2014-12-21 NOTE — ED Notes (Signed)
CBG 499

## 2014-12-21 NOTE — ED Provider Notes (Signed)
CSN: [id number]     Arrival date & time 12/21/14  0711 History   First MD Initiated Contact with Patient 12/21/14 [phone]     Chief Complaint  Patient presents with  . Hyperglycemia     (Consider location/radiation/quality/duration/timing/severity/associated sxs/prior Treatment) HPI Comments: Level V caveat apply secondary to altered mental status and severity of condition  The patient is a 34 year old female who is known to be noncompliant with her diabetic medications who presents by ambulance after a person at her residence called for assistance when she was tachypneic and had decreased level of consciousness this morning. The report was that she had been vomiting that started last night. The patient is unable to give any other history, she is tachypneic, altered, very tachycardic and ill appearing. According to the medical record the patient has had admissions in the past for diabetic ketoacidosis and severe hyperglycemia.  Patient is a 34 y.o. female presenting with hyperglycemia. The history is provided by the patient and the EMS personnel.  Hyperglycemia   Past Medical History  Diagnosis Date  . Asthma   . Diabetes mellitus without complication   . JXBJYNWG(956.2Headache(784.0)    Past Surgical History  Procedure Laterality Date  . No past surgeries     Family History  Problem Relation Age of Onset  . Heart disease      No family history   History  Substance Use Topics  . Smoking status: Current Every Day Smoker -- 0.25 packs/day for 20 years    Types: Cigarettes  . Smokeless tobacco: Current User  . Alcohol Use: No   OB History    No data available     Review of Systems  Unable to perform ROS: Acuity of condition      Allergies  Latex  Home Medications   Prior to Admission medications   Medication Sig Start Date End Date Taking? Authorizing Provider  albuterol (PROAIR HFA) 108 (90 BASE) MCG/ACT inhaler Inhale 2 puffs into the lungs 2 (two) times daily as needed for  wheezing or shortness of breath.   Yes Historical Provider, MD  aspirin EC 81 MG EC tablet Take 1 tablet (81 mg total) by mouth daily. 11/20/13  Yes Nani RavensAndrew M Wight, MD  ibuprofen (ADVIL,MOTRIN) 200 MG tablet Take 400 mg by mouth every 6 (six) hours as needed for moderate pain.   Yes Historical Provider, MD  insulin NPH-regular Human (NOVOLIN 70/30) (70-30) 100 UNIT/ML injection Inject 15 unit in am,15 unit at noon and 15 unit at night with each meal. Hold if CBG is less than 70 10/25/14  Yes Doreene ElandKehinde T Eniola, MD  Lancets (ONETOUCH ULTRASOFT) lancets Use as instructed 09/08/14  Yes Doreene ElandKehinde T Eniola, MD  metFORMIN (GLUCOPHAGE-XR) 500 MG 24 hr tablet Take 2 tablets (1,000 mg total) by mouth daily with breakfast. 09/08/14  Yes Doreene ElandKehinde T Eniola, MD  traZODone (DESYREL) 50 MG tablet Take 1 tablet (50 mg total) by mouth 2 (two) times daily. 10/31/14  Yes Doreene ElandKehinde T Eniola, MD  glucose blood (ONE TOUCH ULTRA TEST) test strip Use as instructed Patient not taking: Reported on 11/03/2014 09/08/14   Doreene ElandKehinde T Eniola, MD   BP 110/48 mmHg  Pulse 150  Temp(Src) 98.8 F (37.1 C) (Rectal)  Resp 36  Ht 5\' 5"  (1.651 m)  Wt 135 lb (61.236 kg)  BMI 22.47 kg/m2  SpO2 93% Physical Exam  Constitutional: She appears well-developed. She appears distressed.  HENT:  Head: Normocephalic and atraumatic.  Mouth/Throat: No oropharyngeal exudate.  Severe  dehydration of the mucous membranes  Eyes: Conjunctivae and EOM are normal. Pupils are equal, round, and reactive to light. Right eye exhibits no discharge. Left eye exhibits no discharge. No scleral icterus.  Neck: Normal range of motion. Neck supple. No JVD present. No thyromegaly present.  Cardiovascular: Regular rhythm, normal heart sounds and intact distal pulses.  Exam reveals no gallop and no friction rub.   No murmur heard. Tachycardic to 147 bpm  Pulmonary/Chest: Breath sounds normal. She is in respiratory distress. She has no wheezes. She has no rales.  Occasional  expiratory wheezing, tachypnea  Abdominal: Soft. Bowel sounds are normal. She exhibits no distension and no mass. There is no tenderness.  Musculoskeletal: Normal range of motion. She exhibits no edema or tenderness.  Lymphadenopathy:    She has no cervical adenopathy.  Neurological: Coordination normal.  Somnolent too obtunded, able to follow commands occasionally, able to speak occasionally  Skin: Skin is warm and dry. No rash noted. No erythema.  Psychiatric: She has a normal mood and affect. Her behavior is normal.  Nursing note and vitals reviewed.   ED Course  Procedures (including critical care time) Labs Review Labs Reviewed  CBC WITH DIFFERENTIAL/PLATELET - Abnormal; Notable for the following:    WBC 18.7 (*)    HCT 46.1 (*)    MCV 106.0 (*)    Platelets 467 (*)    Neutrophils Relative % 80 (*)    Neutro Abs 15.0 (*)    Monocytes Absolute 1.3 (*)    Basophils Absolute 0.2 (*)    All other components within normal limits  URINALYSIS, ROUTINE W REFLEX MICROSCOPIC - Abnormal; Notable for the following:    APPearance CLOUDY (*)    Glucose, UA >1000 (*)    Hgb urine dipstick SMALL (*)    Bilirubin Urine MODERATE (*)    Ketones, ur >80 (*)    Protein, ur 30 (*)    All other components within normal limits  BETA-HYDROXYBUTYRIC ACID - Abnormal; Notable for the following:    Beta-Hydroxybutyric Acid >8.00 (*)    All other components within normal limits  COMPREHENSIVE METABOLIC PANEL - Abnormal; Notable for the following:    Potassium 6.5 (*)    CO2 6 (*)    Glucose, Bld 709 (*)    BUN 31 (*)    Creatinine, Ser 2.30 (*)    Alkaline Phosphatase 151 (*)    Total Bilirubin 2.0 (*)    GFR calc non Af Amer 27 (*)    GFR calc Af Amer 31 (*)    Anion gap 35 (*)    All other components within normal limits  URINE MICROSCOPIC-ADD ON - Abnormal; Notable for the following:    Squamous Epithelial / LPF FEW (*)    Bacteria, UA FEW (*)    All other components within normal  limits  CBG MONITORING, ED - Abnormal; Notable for the following:    Glucose-Capillary 565 (*)    All other components within normal limits  I-STAT VENOUS BLOOD GAS, ED - Abnormal; Notable for the following:    pH, Ven 6.974 (*)    pCO2, Ven 14.4 (*)    pO2, Ven 100.0 (*)    Bicarbonate 3.4 (*)    Acid-base deficit 27.0 (*)    All other components within normal limits  I-STAT CG4 LACTIC ACID, ED - Abnormal; Notable for the following:    Lactic Acid, Venous 3.71 (*)    All other components within normal limits  CBG  MONITORING, ED - Abnormal; Notable for the following:    Glucose-Capillary 499 (*)    All other components within normal limits  CULTURE, BLOOD (ROUTINE X 2)  CULTURE, BLOOD (ROUTINE X 2)  URINE RAPID DRUG SCREEN (HOSP PERFORMED)  BASIC METABOLIC PANEL  BASIC METABOLIC PANEL  BASIC METABOLIC PANEL  BASIC METABOLIC PANEL  MAGNESIUM  PHOSPHORUS  I-STAT CHEM 8, ED  POC URINE PREG, ED    Imaging Review Dg Chest Portable 1 View  12/21/2014   CLINICAL DATA:  Unresponsive, hyperglycemia  EXAM: PORTABLE CHEST - 1 VIEW  COMPARISON:  11/15/2013  FINDINGS: The heart size and mediastinal contours are within normal limits. Both lungs are clear. The visualized skeletal structures are unremarkable.  IMPRESSION: No active disease.   Electronically Signed   By: Judie Petit.  Shick M.D.   On: 12/21/2014 08:08     EKG Interpretation   Date/Time:  Thursday December 21 2014 07:21:48 EDT Ventricular Rate:  150 PR Interval:  114 QRS Duration: 90 QT Interval:  281 QTC Calculation: 444 R Axis:   94 Text Interpretation:  Sinus or ectopic atrial tachycardia Multiform  ventricular premature complexes Borderline right axis deviation RSR' in V1  or V2, probably normal variant Borderline repolarization abnormality Since  last tracing rate faster Abnormal ekg Confirmed by Hyacinth Meeker  MD, Thao Vanover  [phone]) on 12/21/2014 7:33:44 AM      MDM   Final diagnoses:  Diabetic ketoacidosis with coma associated  with type 1 diabetes mellitus  Acute renal failure, unspecified acute renal failure type  Metabolic acidosis, increased anion gap    The patient has flat veins, there was significant difficulty getting IV access, EMS placed a 20-gauge IV in the left arm, I placed a 20-gauge IV in the right antecubital fossa, see procedure note below. The patient is severely tachycardic, severely dehydrated, she is tachypneic, her blood sugar prior to arrival was too high to read. This suggests that she is in severe diabetic ketoacidosis. She will need aggressive IV fluid resuscitation as well as insulin drip. Chemistry sent, VBG sent, lactic acid sent, the patient will need a high level of care she is critically ill. Insulin drip started.  Angiocath insertion Performed by: Vida Roller  Consent: Verbal consent obtained. Risks and benefits: risks, benefits and alternatives were discussed Time out: Immediately prior to procedure a "time out" was called to verify the correct patient, procedure, equipment, support staff and site/side marked as required.  Preparation: Patient was prepped and draped in the usual sterile fashion.  Vein Location: R AC  Not Ultrasound Guided  Gauge: 20  Normal blood return and flush without difficulty Patient tolerance: Patient tolerated the procedure well with no immediate complications.  Initial labs show the following:  VBG - pH of 6.97 CO2 - unmeasurable low K 6.3 Na 132 Glucose > 700 Hg - 16.7 WBC > 18,000 Anion gap > 20  I have personally seen and interpretted the CXR - no signs of pulmonary edema, infiltrate or PNA - no PTX, normal mediastinum, no free air.  D/w CC at 8:10 AM. - to be admitted to ICU  NS bolus X 4, ARF seen on labs Severe acidosis ICU admit  Meds given in ED:  Medications  0.9 %  sodium chloride infusion ( Intravenous New Bag/Given 12/21/14 0920)  dextrose 5 %-0.45 % sodium chloride infusion (not administered)  insulin regular  (NOVOLIN R,HUMULIN R) 250 Units in sodium chloride 0.9 % 250 mL (1 Units/mL) infusion (8.8 Units/hr Intravenous New Bag/Given  12/21/14 0922)  heparin injection 5,000 Units (not administered)  pantoprazole (PROTONIX) injection 40 mg (not administered)  ondansetron (ZOFRAN) injection 4 mg (not administered)  sodium chloride 0.9 % bolus 1,000 mL (not administered)  0.9 %  sodium chloride infusion (0 mLs Intravenous Stopped 12/21/14 0757)    Followed by  0.9 %  sodium chloride infusion (0 mLs Intravenous Stopped 12/21/14 0851)    Followed by  0.9 %  sodium chloride infusion (0 mLs Intravenous Stopped 12/21/14 0919)  sodium bicarbonate injection 25 mEq (25 mEq Intravenous Given 12/21/14 0749)     CRITICAL CARE Performed by: Eber Hong D Total critical care time: 35 Critical care time was exclusive of separately billable procedures and treating other patients. Critical care was necessary to treat or prevent imminent or life-threatening deterioration. Critical care was time spent personally by me on the following activities: development of treatment plan with patient and/or surrogate as well as nursing, discussions with consultants, evaluation of patient's response to treatment, examination of patient, obtaining history from patient or surrogate, ordering and performing treatments and interventions, ordering and review of laboratory studies, ordering and review of radiographic studies, pulse oximetry and re-evaluation of patient's condition.     Eber Hong, MD 12/21/14 [phone]

## 2014-12-21 NOTE — ED Notes (Signed)
CBG- 371 

## 2014-12-21 NOTE — ED Notes (Signed)
Admitting MD at bedside.

## 2014-12-21 NOTE — ED Notes (Signed)
All critical labs reported to Dr.Miller.

## 2014-12-21 NOTE — ED Notes (Signed)
CBG 565. 

## 2014-12-21 NOTE — H&P (Addendum)
PULMONARY / CRITICAL CARE MEDICINE   Name: Kristine Allison MRN: [id number] DOB: 1981-06-17    ADMISSION DATE:  12/21/2014  REFERRING MD :  EDP  CHIEF COMPLAINT:  DKA  INITIAL PRESENTATION: 34yo female with hx Type 1 DM, known to be frequently non-compliant with DM rx, presented 3/31 with AMS, tachypnea, vomiting.  PCCM called to admit to ICU for severe DKA.   STUDIES:    SIGNIFICANT EVENTS:   HISTORY OF PRESENT ILLNESS:  34yo female with hx Type 1 DM, known to be frequently non-compliant with DM rx.  She presented to ER 3/31 after a person at her residence called EMS r/t 1 day hx nausea/vomiting and worsening lethargy, tachypnea.  Pt altered, tachycardic, tachypneic, unable to provide further hx.   PAST MEDICAL HISTORY :   has a past medical history of Asthma; Diabetes mellitus without complication; and Headache(784.0).  has past surgical history that includes No past surgeries. Prior to Admission medications   Medication Sig Start Date End Date Taking? Authorizing Provider  metFORMIN (GLUCOPHAGE-XR) 500 MG 24 hr tablet Take 2 tablets (1,000 mg total) by mouth daily with breakfast. 09/08/14  Yes Doreene Eland, MD  albuterol (PROAIR HFA) 108 (90 BASE) MCG/ACT inhaler Inhale 2 puffs into the lungs 2 (two) times daily as needed for wheezing or shortness of breath.    Historical Provider, MD  aspirin EC 81 MG EC tablet Take 1 tablet (81 mg total) by mouth daily. 11/20/13   Nani Ravens, MD  glucose blood (ONE TOUCH ULTRA TEST) test strip Use as instructed Patient not taking: Reported on 11/03/2014 09/08/14   Doreene Eland, MD  ibuprofen (ADVIL,MOTRIN) 200 MG tablet Take 400 mg by mouth every 6 (six) hours as needed for moderate pain.    Historical Provider, MD  insulin NPH-regular Human (NOVOLIN 70/30) (70-30) 100 UNIT/ML injection Inject 15 unit in am,15 unit at noon and 15 unit at night with each meal. Hold if CBG is less than 70 10/25/14   Doreene Eland, MD  Lancets  (ONETOUCH ULTRASOFT) lancets Use as instructed 09/08/14   Doreene Eland, MD  menthol-cetylpyridinium (CEPACOL) 3 MG lozenge Take 1 lozenge by mouth as needed for sore throat.    Historical Provider, MD  phenol (CHLORASEPTIC) 1.4 % LIQD Use as directed 1 spray in the mouth or throat as needed for throat irritation / pain.    Historical Provider, MD  ranitidine (ZANTAC) 150 MG tablet Take 150 mg by mouth 2 (two) times daily.    Historical Provider, MD  traZODone (DESYREL) 50 MG tablet Take 1 tablet (50 mg total) by mouth 2 (two) times daily. 10/31/14   Doreene Eland, MD   Allergies  Allergen Reactions  . Latex Rash    FAMILY HISTORY:  has no family status information on file.  SOCIAL HISTORY:  reports that she has been smoking Cigarettes.  She has a 5 pack-year smoking history. She uses smokeless tobacco. She reports that she does not drink alcohol or use illicit drugs.  REVIEW OF SYSTEMS:  Unable r/t AMS.   SUBJECTIVE:   VITAL SIGNS: Temp:  [98.8 F (37.1 C)] 98.8 F (37.1 C) (03/31 0745) Pulse Rate:  [142-151] 143 (03/31 0815) Resp:  [24-29] 26 (03/31 0815) BP: (88-120)/(31-72) 94/46 mmHg (03/31 0815) SpO2:  [95 %-98 %] 97 % (03/31 0815) Weight:  [135 lb (61.236 kg)] 135 lb (61.236 kg) (03/31 0729) HEMODYNAMICS:   VENTILATOR SETTINGS:   INTAKE / OUTPUT:  Intake/Output Summary (  Last 24 hours) at 12/21/14 0902 Last data filed at 12/21/14 0746  Gross per 24 hour  Intake      0 ml  Output    250 ml  Net   -250 ml    PHYSICAL EXAMINATION: General:  Chronically ill appearing young female, acutely ill  Neuro:  Lethargic, moans to stimulation, does not follow commands  HEENT:  Mm dry, lips cracking Cardiovascular:  s1s2 rrr, tachycardic  Lungs:  resps even, tachypneic, cta  Abdomen:  Soft, non tender, non distended, hypoactive bs   Musculoskeletal:  Warm and dry, no edema, faint mottling BLE  LABS:  CBC  Recent Labs Lab 12/21/14 0725  WBC 18.7*  HGB 14.7  HCT  46.1*  PLT 467*   Coag's No results for input(s): APTT, INR in the last 168 hours. BMET  Recent Labs Lab 12/21/14 0725  NA 137  K 6.5*  CL 96  CO2 6*  BUN 31*  CREATININE 2.30*  GLUCOSE 709*   Electrolytes  Recent Labs Lab 12/21/14 0725  CALCIUM 9.5   Sepsis Markers  Recent Labs Lab 12/21/14 0736  LATICACIDVEN 3.71*   ABG No results for input(s): PHART, PCO2ART, PO2ART in the last 168 hours. Liver Enzymes  Recent Labs Lab 12/21/14 0725  AST 20  ALT 19  ALKPHOS 151*  BILITOT 2.0*  ALBUMIN 3.5   Cardiac Enzymes No results for input(s): TROPONINI, PROBNP in the last 168 hours. Glucose  Recent Labs Lab 12/21/14 0811  GLUCAP 565*    Imaging No results found.   ASSESSMENT / PLAN:  ENDOCRINE DM - poorly controlled, non compliant  DKA - r/t medication noncompliance  P:   Admit ICU Insulin gtt  Frequent chem  DM education    CARDIOVASCULAR Tachycardia Hypotension - borderline  Hypovolemia  P:  Aggressive volume resuscitation  F/u lactate   RENAL Anion gap metabolic acidosis r/t DKA  hyperkalemia  AKI P:   Aggressive volume resuscitation  F/u BMET q4  F/u ABG  Insulin gtt as below   GASTROINTESTINAL nausea/vomiting P:   PRN zofran  NPO for now  PPI   PULMONARY Tachypnea  Respiratory compensation for significant metabolic acidosis/ DKA  P:   F/u ABG  Supplemental O2 as needed  Supportive care    HEMATOLOGIC Leukocytosis - mild  P:  F/u cbc  SQ heparin   INFECTIOUS No obvious source infection  P:   BCx2 3/31>>> UC 3/31>>>  Monitor wbc, fever curve off abx for now    NEUROLOGIC AMS - r/t DKA P:   Supportive care  Monitor airway protection    FAMILY  - Updates: no family available 3/31  - Inter-disciplinary family meet or Palliative Care meeting due by:  4/6    Dirk Dress, NP 12/21/2014  9:02 AM Pager: [phone] or [phone]  Reviewed above, examined.  34 yo female with hx of  IDDM and medical non compliance presented to ED with n/v, dyspnea, and altered mental status.  Found to have markedly elevated blood sugar, AKI, metabolic acidosis with positive ketones.  She is obtunded, kussmaul respiratory pattern, dry oral mucosa, pupils reactive, moves extremities with stimulation, lungs w/o wheeze/rales, heart rate tachy and regular, abd soft with decreased bowel sounds, no edema, no skin lesions.  Will admit to ICU, continue IV insulin until anion gap closes, continue NS IV fluid for now >> change to D5 1/2 NS when blood sugar less than 250, closely monitor electrolytes and replace as needed.  No  evidence for infection >> elevated lactic acid likely related to hypovolemia and hypoperfusion, and metformin use in setting of AKI >> will f/u lactic acid and blood cx's >> defer Abx for now.  CC time by me independent of APP time is 40 minutes.  Coralyn HellingVineet Annaliza Zia, MD [facility] 12/21/2014, 9:28 AM Pager:  [phone] After 3pm call: [phone]

## 2014-12-21 NOTE — ED Notes (Signed)
Pt brought in by EMS for hyperglycemia and vomiting that began last night.  Pt has hx of DM and is non-compliant with medications.

## 2014-12-22 ENCOUNTER — Encounter (HOSPITAL_COMMUNITY): Payer: Self-pay | Admitting: General Practice

## 2014-12-22 LAB — GLUCOSE, CAPILLARY
GLUCOSE-CAPILLARY: 261 mg/dL — AB (ref 70–99)
GLUCOSE-CAPILLARY: 178 mg/dL — AB (ref 70–99)
GLUCOSE-CAPILLARY: 145 mg/dL — AB (ref 70–99)
GLUCOSE-CAPILLARY: 120 mg/dL — AB (ref 70–99)
GLUCOSE-CAPILLARY: 115 mg/dL — AB (ref 70–99)
GLUCOSE-CAPILLARY: 147 mg/dL — AB (ref 70–99)
GLUCOSE-CAPILLARY: 144 mg/dL — AB (ref 70–99)
GLUCOSE-CAPILLARY: 137 mg/dL — AB (ref 70–99)
GLUCOSE-CAPILLARY: 91 mg/dL (ref 70–99)
Glucose-Capillary: 125 mg/dL — ABNORMAL HIGH (ref 70–99)
Glucose-Capillary: 140 mg/dL — ABNORMAL HIGH (ref 70–99)
Glucose-Capillary: 144 mg/dL — ABNORMAL HIGH (ref 70–99)
Glucose-Capillary: 147 mg/dL — ABNORMAL HIGH (ref 70–99)
Glucose-Capillary: 155 mg/dL — ABNORMAL HIGH (ref 70–99)
Glucose-Capillary: 173 mg/dL — ABNORMAL HIGH (ref 70–99)
Glucose-Capillary: 174 mg/dL — ABNORMAL HIGH (ref 70–99)
Glucose-Capillary: 197 mg/dL — ABNORMAL HIGH (ref 70–99)
Glucose-Capillary: 155 mg/dL — ABNORMAL HIGH (ref 70–99)
Glucose-Capillary: 155 mg/dL — ABNORMAL HIGH (ref 70–99)
Glucose-Capillary: 149 mg/dL — ABNORMAL HIGH (ref 70–99)
Glucose-Capillary: 148 mg/dL — ABNORMAL HIGH (ref 70–99)
Glucose-Capillary: 164 mg/dL — ABNORMAL HIGH (ref 70–99)
Glucose-Capillary: 153 mg/dL — ABNORMAL HIGH (ref 70–99)
Glucose-Capillary: 116 mg/dL — ABNORMAL HIGH (ref 70–99)
Glucose-Capillary: 109 mg/dL — ABNORMAL HIGH (ref 70–99)

## 2014-12-22 LAB — BASIC METABOLIC PANEL
ANION GAP: 11 (ref 5–15)
BUN: 11 mg/dL (ref 6–23)
CO2: 17 mmol/L — AB (ref 19–32)
Calcium: 7.7 mg/dL — ABNORMAL LOW (ref 8.4–10.5)
Chloride: 117 mmol/L — ABNORMAL HIGH (ref 96–112)
Creatinine, Ser: 0.83 mg/dL (ref 0.50–1.10)
GFR calc Af Amer: >90 mL/min (ref >90)
GFR calc non Af Amer: >90 mL/min (ref >90)
Glucose, Bld: 172 mg/dL — ABNORMAL HIGH (ref 70–99)
POTASSIUM: 3.4 mmol/L — AB (ref 3.5–5.1)
SODIUM: 145 mmol/L (ref 135–145)

## 2014-12-22 LAB — PHOSPHORUS: PHOSPHORUS: 2 mg/dL — AB (ref 2.3–4.6)

## 2014-12-22 LAB — CBC
HEMATOCRIT: 35.4 % — AB (ref 36.0–46.0)
Hemoglobin: 11.7 g/dL — ABNORMAL LOW (ref 12.0–15.0)
MCH: 32.8 pg (ref 26.0–34.0)
MCHC: 33.1 g/dL (ref 30.0–36.0)
MCV: 99.2 fL (ref 78.0–100.0)
Platelets: 232 10*3/uL (ref 150–400)
RBC: 3.57 MIL/uL — ABNORMAL LOW (ref 3.87–5.11)
RDW: 14.2 % (ref 11.5–15.5)
WBC: 9.2 10*3/uL (ref 4.0–10.5)

## 2014-12-22 LAB — MAGNESIUM: Magnesium: 1.7 mg/dL (ref 1.5–2.5)

## 2014-12-22 MED ORDER — INSULIN ASPART PROT & ASPART (70-30 MIX) 100 UNIT/ML ~~LOC~~ SUSP
15.0000 [IU] | Freq: Three times a day (TID) | SUBCUTANEOUS | Status: DC
  Administered 2014-12-22 – 2014-12-23 (×4): 15 [IU] via SUBCUTANEOUS
  Filled 2014-12-22: qty 10

## 2014-12-22 MED ORDER — MAGNESIUM SULFATE 2 GM/50ML IV SOLN
2.0000 g | Freq: Once | INTRAVENOUS | Status: AC
  Administered 2014-12-22: 2 g via INTRAVENOUS
  Filled 2014-12-22: qty 50

## 2014-12-22 MED ORDER — SODIUM CHLORIDE 0.9 % IV BOLUS (SEPSIS)
1000.0000 mL | Freq: Once | INTRAVENOUS | Status: AC
  Administered 2014-12-22: 1000 mL via INTRAVENOUS

## 2014-12-22 MED ORDER — POTASSIUM CHLORIDE CRYS ER 20 MEQ PO TBCR
40.0000 meq | EXTENDED_RELEASE_TABLET | Freq: Once | ORAL | Status: AC
  Administered 2014-12-22: 40 meq via ORAL
  Filled 2014-12-22: qty 2

## 2014-12-22 MED ORDER — INSULIN ASPART 100 UNIT/ML ~~LOC~~ SOLN
0.0000 [IU] | SUBCUTANEOUS | Status: DC
  Administered 2014-12-22: 3 [IU] via SUBCUTANEOUS

## 2014-12-22 MED ORDER — SODIUM CHLORIDE 0.9 % IV SOLN
INTRAVENOUS | Status: DC
  Administered 2014-12-22 (×2): via INTRAVENOUS

## 2014-12-22 MED ORDER — SODIUM PHOSPHATE 3 MMOLE/ML IV SOLN
10.0000 mmol | Freq: Once | INTRAVENOUS | Status: AC
  Administered 2014-12-22: 10 mmol via INTRAVENOUS
  Filled 2014-12-22: qty 3.33

## 2014-12-22 MED ORDER — GUAIFENESIN 100 MG/5ML PO SYRP
400.0000 mg | ORAL_SOLUTION | ORAL | Status: DC | PRN
Start: 2014-12-22 — End: 2014-12-23
  Administered 2014-12-22: 400 mg via ORAL
  Filled 2014-12-22 (×2): qty 20

## 2014-12-22 NOTE — Progress Notes (Addendum)
PULMONARY / CRITICAL CARE MEDICINE   Name: Kristine Allison MRN: [id number] DOB: 1980-10-20    ADMISSION DATE:  12/21/2014  REFERRING MD :  EDP  CHIEF COMPLAINT:  DKA  INITIAL PRESENTATION: 34yo female with hx Type 1 DM, known to be frequently non-compliant with DM rx, presented 3/31 with AMS, tachypnea, vomiting.  PCCM called to admit to ICU for severe DKA.   STUDIES:    SIGNIFICANT EVENTS:  SUBJECTIVE: no N/V Feels tired C/o cough afebrile  VITAL SIGNS: Temp:  [97.6 F (36.4 C)-99.8 F (37.7 C)] 97.8 F (36.6 C) (04/01 0902) Pulse Rate:  [25-153] 102 (04/01 0800) Resp:  [11-48] 21 (04/01 0800) BP: (78-111)/(41-71) 89/53 mmHg (04/01 0800) SpO2:  [92 %-100 %] 94 % (04/01 0800) HEMODYNAMICS:   VENTILATOR SETTINGS:   INTAKE / OUTPUT:  Intake/Output Summary (Last 24 hours) at 12/22/14 0929 Last data filed at 12/22/14 0800  Gross per 24 hour  Intake 2640.94 ml  Output   2250 ml  Net 390.94 ml    PHYSICAL EXAMINATION: General:  Chronically ill appearing young female, acutely ill  Neuro: awake, alert, non focal HEENT:  Mm dry, lips cracking Cardiovascular:  s1s2 rrr, tachycardic  Lungs:  resps even, tachypneic, cta  Abdomen:  Soft, non tender, non distended, hypoactive bs   Musculoskeletal:  Warm and dry, no edema  LABS:  CBC  Recent Labs Lab 12/21/14 0725 12/22/14 0137  WBC 18.7* 9.2  HGB 14.7 11.7*  HCT 46.1* 35.4*  PLT 467* 232   Coag's No results for input(s): APTT, INR in the last 168 hours. BMET  Recent Labs Lab 12/21/14 1639 12/21/14 2221 12/22/14 0137  NA 143 146* 145  K 4.4 4.2 3.4*  CL 114* 121* 117*  CO2 14* 16* 17*  BUN CREATININE 1.31* 0.87 0.83  GLUCOSE 186* 133* 172*   Electrolytes  Recent Labs Lab 12/21/14 0900  12/21/14 1639 12/21/14 2221 12/22/14 0137  CALCIUM 7.8*  < > 8.0* 7.2* 7.7*  MG 2.2  --   --   --  1.7  PHOS 8.7*  --   --   --  2.0*  < > = values in this interval not displayed. Sepsis  Markers  Recent Labs Lab 12/21/14 0736 12/21/14 1637  LATICACIDVEN 3.71* 1.4   ABG No results for input(s): PHART, PCO2ART, PO2ART in the last 168 hours. Liver Enzymes  Recent Labs Lab 12/21/14 0725  AST 20  ALT 19  ALKPHOS 151*  BILITOT 2.0*  ALBUMIN 3.5   Cardiac Enzymes No results for input(s): TROPONINI, PROBNP in the last 168 hours. Glucose  Recent Labs Lab 12/22/14 0347 12/22/14 0505 12/22/14 0606 12/22/14 0709 12/22/14 0804 12/22/14 0909  GLUCAP 155* 149* 148* 147* 144* 137*    Imaging Dg Chest Portable 1 View  12/21/2014   CLINICAL DATA:  Unresponsive, hyperglycemia  EXAM: PORTABLE CHEST - 1 VIEW  COMPARISON:  11/15/2013  FINDINGS: The heart size and mediastinal contours are within normal limits. Both lungs are clear. The visualized skeletal structures are unremarkable.  IMPRESSION: No active disease.   Electronically Signed   By: Judie Petit.  Shick M.D.   On: 12/21/2014 08:08     ASSESSMENT / PLAN:  ENDOCRINE DM - poorly controlled, non compliant  DKA - r/t medication noncompliance  P:   Transition off Insulin gtt after giving 70/30 tid DM education  Social work consult- cannot afford insulin   CARDIOVASCULAR Tachycardia Hypotension - borderline  Hypovolemia  P:  Drop  IVf s to 50/h F/u lactate   RENAL Anion gap metabolic acidosis r/t DKA  hyperkalemia  AKI P:   replete K  GASTROINTESTINAL nausea/vomiting P:   PRN zofran  Advance PO Dc PPI   PULMONARY Tachypnea  Respiratory compensation for significant metabolic acidosis/ DKA  P:   Supplemental O2 as needed     HEMATOLOGIC Leukocytosis - mild  P:  F/u cbc  SQ heparin   INFECTIOUS No obvious source infection  P:   BCx2 3/31>>>ng UC 3/31>>>  Monitor wbc, fever curve off abx for now    NEUROLOGIC Acute encephalopathy- r/t DKA P:   Supportive care     FAMILY  - Updates: patient  - Inter-disciplinary family meet or Palliative Care meeting due by:  NA  SUmmary  75- 33 yo female with hx of IDDM and medical non compliance presented to ED with n/v, dyspnea, and altered mental status.  Found to have markedly elevated blood sugar, AKI, metabolic acidosis with positive ketones. Gap resolved, transition off insulin gt, Transfer to floor & [facility] 4/2   The patient is critically ill with multiple organ systems failure and requires high complexity decision making for assessment and support, frequent evaluation and titration of therapies, application of advanced monitoring technologies and extensive interpretation of multiple databases. Critical Care Time devoted to patient care services described in this note independent of APP time is 31 minutes.   Cyril Mourningakesh Geralene Afshar MD. Tonny BollmanFCCP. [facility] Pager [phone] If no response call [phone]   12/22/2014, 9:29 AM

## 2014-12-22 NOTE — Care Management Note (Signed)
    Page 1 of 1   12/22/2014     11:53:31 AM CARE MANAGEMENT NOTE 12/22/2014  Patient:  Kristine Allison,Kristine Allison   Account Number:  [id number]  Date Initiated:  12/21/2014  Documentation initiated by:  Avie ArenasBROWN,Delyle Weider  Subjective/Objective Assessment:   Admitted with DKA - insulin drip     Action/Plan:   Anticipated DC Date:  12/23/2014   Anticipated DC Plan:  HOME/SELF CARE      DC Planning Services  CM consult  Medication Assistance      Choice offered to / List presented to:             Status of service:  In process, will continue to follow Medicare Important Message given?  NO (If response is "NO", the following Medicare IM given date fields will be blank) Date Medicare IM given:   Medicare IM given by:   Date Additional Medicare IM given:   Additional Medicare IM given by:    Discharge Disposition:    Per UR Regulation:    If discussed at Long Length of Stay Meetings, dates discussed:    Comments:  Contact:  Allison,Kristine Mother [phone]  12-22-14 11:50am Avie ArenasSarah Irvan Tiedt, RNBSN [phone]- [phone] Spoke with patient - lying in bed.  Did not open eyes the entire time of our conversation.  States has a PCP for her diabetes - Eniola - is on metphormin - which [facility] [facility]  Is on 70-30 insulin 3 x per day and [facility]  but could not bet till Monday when she gets paid.  Will check for coupon for this to get her through.  CM will continue to follow.

## 2014-12-22 NOTE — Progress Notes (Signed)
eLink Physician-Brief Progress Note Patient Name: Crista ElliotMargie E Simmons-Cohn DOB: [birth year] MRN: [id number]   Date of Service  12/22/2014  HPI/Events of Note  BP = 87/52.   eICU Interventions  Bolus with 0.9 NaCl 1 liter IV over 1 hour now.      Intervention Category Intermediate Interventions: Hypotension - evaluation and management  Cassanda Walmer Eugene 12/22/2014, 3:42 AM

## 2014-12-22 NOTE — Progress Notes (Signed)
[facility]EMENT [facility]OTOCOL FOR AM LAB REPLACEMENT ONLY  The patient does apply for the [facility] Electrolyte [facility] Protocol based on the criteria listed below:   1. Is GFR >/= 40 ml/min? Yes.    Patient's GFR today is >90 2. Is urine output >/= 0.5 ml/kg/hr for the last 6 hours? Yes.   Patient's UOP is 0.8 ml/kg/hr 3. Is BUN < 60 mg/dL? Yes.    Patient's BUN today is 11 4. Abnormal electrolyte(s):K3.4,phos2.0,mg1.7 5. Ordered repletion with: protocol 6. If a panic level lab has been reported, has the CCM MD in charge been notified? Yes.  .   Physician:  S Sommer,MD  Melrose NakayamaChisholm, Nafeesa Dils William 12/22/2014 5:35 AM

## 2014-12-22 NOTE — Clinical Documentation Improvement (Signed)
  Admitted with "severe DKA" and "altered mental status" related to DKA, became "obtunded". Please clarify term "altered mental status" to better reflect severity of illness and risk of mortality.  . Document the etiology of the altered mental status as: --Coma --Stupor/semi-coma --Encephalopathy Alcoholic Anoxic/hypoxic Metabolic/septic Other (specify) . Document any associated diagnoses/conditions  Thank Harle BattiestYou,  Merrell Borsuk RN CDI [phone] HIM department

## 2014-12-23 DIAGNOSIS — E1011 Type 1 diabetes mellitus with ketoacidosis with coma: Secondary | ICD-10-CM | POA: Insufficient documentation

## 2014-12-23 DIAGNOSIS — E8729 Other acidosis: Secondary | ICD-10-CM | POA: Insufficient documentation

## 2014-12-23 DIAGNOSIS — N179 Acute kidney failure, unspecified: Secondary | ICD-10-CM | POA: Insufficient documentation

## 2014-12-23 DIAGNOSIS — E872 Acidosis: Secondary | ICD-10-CM

## 2014-12-23 LAB — PHOSPHORUS: Phosphorus: 1.9 mg/dL — ABNORMAL LOW (ref 2.3–4.6)

## 2014-12-23 LAB — GLUCOSE, CAPILLARY
GLUCOSE-CAPILLARY: 100 mg/dL — AB (ref 70–99)
GLUCOSE-CAPILLARY: 308 mg/dL — AB (ref 70–99)
Glucose-Capillary: >600 mg/dL (ref 70–99)
Glucose-Capillary: 82 mg/dL (ref 70–99)
Glucose-Capillary: 248 mg/dL — ABNORMAL HIGH (ref 70–99)

## 2014-12-23 LAB — BASIC METABOLIC PANEL
Anion gap: 2 — ABNORMAL LOW (ref 5–15)
BUN: <5 mg/dL — ABNORMAL LOW (ref 6–23)
CO2: 25 mmol/L (ref 19–32)
Calcium: 7.9 mg/dL — ABNORMAL LOW (ref 8.4–10.5)
Chloride: 111 mmol/L (ref 96–112)
Creatinine, Ser: 0.58 mg/dL (ref 0.50–1.10)
GFR calc Af Amer: >90 mL/min (ref >90)
GLUCOSE: 77 mg/dL (ref 70–99)
POTASSIUM: 3.1 mmol/L — AB (ref 3.5–5.1)
Sodium: 138 mmol/L (ref 135–145)

## 2014-12-23 LAB — MAGNESIUM: Magnesium: 1.7 mg/dL (ref 1.5–2.5)

## 2014-12-23 MED ORDER — ALBUTEROL SULFATE (2.5 MG/3ML) 0.083% IN NEBU
3.0000 mL | INHALATION_SOLUTION | RESPIRATORY_TRACT | Status: DC | PRN
Start: 2014-12-23 — End: 2014-12-23

## 2014-12-23 MED ORDER — ALBUTEROL SULFATE HFA 108 (90 BASE) MCG/ACT IN AERS: 2.0000 | INHALATION_SPRAY | RESPIRATORY_TRACT | Status: DC | PRN

## 2014-12-23 MED ORDER — INSULIN ASPART 100 UNIT/ML ~~LOC~~ SOLN
0.0000 [IU] | Freq: Three times a day (TID) | SUBCUTANEOUS | Status: DC
  Administered 2014-12-23: 3 [IU] via SUBCUTANEOUS

## 2014-12-23 MED ORDER — K PHOS MONO-SOD PHOS DI & MONO 155-852-130 MG PO TABS
500.0000 mg | ORAL_TABLET | Freq: Three times a day (TID) | ORAL | Status: DC
  Administered 2014-12-23: 500 mg via ORAL
  Filled 2014-12-23 (×3): qty 2

## 2014-12-23 NOTE — Progress Notes (Signed)
Gave patient opened bottle of 70/30 Insulin. Discharge instructions given and explained with teach back. Insulin Voucher given to patient. Trina Aoarla Jakeel Starliper, RN

## 2014-12-23 NOTE — Progress Notes (Signed)
Pt. C/O not being able to breathe. Dr. Arbutus Leasat returned call right away and will be up to see patient next. Trina Aoarla Armanii Urbanik, RN

## 2014-12-23 NOTE — Discharge Summary (Signed)
Physician Discharge Summary  Kristine ElliotMargie E Allison Allison:[id number] DOB: Jul 02, 1981 DOA: 12/21/2014  PCP: Kristine PaganENIOLA, KEHINDE, MD  Admit date: 12/21/2014 Discharge date: 12/23/2014  Recommendations for Outpatient Follow-up:  1. Pt will need to follow up with PCP in 2 weeks post discharge 2. Please obtain BMP in one week  Discharge Diagnoses:  DM (diabetes mellitus) type 1, uncontrolled, with ketoacidosis - secondary to noncompliance which the pt endorses - no clear infectious etiology - AG closed and pt has been off insulin drip  -Patient was transitioned to subcutaneous insulin which she tolerated. - tolerating diet well - continue home dose 70/30 insulin--15 units am, noon, hs - antiemetics as needed  -d/c metformin AKI -secondary to volume depletion -improved, back to baseline Hypokalemia/Hypophophatemia - supplemented, but the patient would intermittently refuse supplementation Leukocytosis - likely reactive - no signs of an infectious etiology, WBC is WNL at the time of discharge - no need for ABX at this time -blood cultures neg at time of d/c Dehydration  - from DKA, also noted hemoconcentration on admission  Discharge Condition: stable  Disposition: home  Diet:carb modified Wt Readings from Last 3 Encounters:  12/21/14 61.236 kg (135 lb)  11/03/14 58.968 kg (130 lb)  09/08/14 55.792 kg (123 lb)    History of present illness:  34 year old female with a history of diabetes mellitus type 1, tobacco abuse and noncompliance presented on 12/21/2014 with one-day history of nausea, vomiting, and lethargy. The patient was found to be tachycardic and tachypneic in the emergency department. Labs revealed serum glucose of 709 with an anion gap of 35. She also had a lactic acid of 3.71. The patient was fluid resuscitated. She was admitted to the ICU. The patient was also found to be in acute renal failure. Blood cultures were unremarkable. The patient was started on an insulin  drip. The patient's anion gap eventually closed. The patient's mental status improved. She was transitioned to subcutaneous insulin. She was transferred to the medical floor during which the patient remained clinically stable. The patient's diet was advanced which she tolerated. Although the patient complained of vitamin shortness of breath, she never had any respiratory distress and her vital signs remained clinically stable and she had oxygen saturation 97-99% on room air.    Consultants: CCM  Discharge Exam: Filed Vitals:   12/23/14 1254  BP: 127/85  Pulse: 84  Temp: 98.1 F (36.7 C)  Resp: 17   Filed Vitals:   12/22/14 2203 12/23/14 0146 12/23/14 0550 12/23/14 1254  BP: 115/79 113/78 115/73 127/85  Pulse: 97 94 84 84  Temp: 98.5 F (36.9 C) 98.1 F (36.7 C) 98.1 F (36.7 C) 98.1 F (36.7 C)  TempSrc: Oral Oral Oral Oral  Resp: 16 16 16 17   Height:      Weight:      SpO2: 98% 96% 96% 95%   General: A&O x 3, NAD, pleasant, cooperative Cardiovascular: RRR, no rub, no gallop, no S3 Respiratory: CTAB, no wheeze, no rhonchi Abdomen:soft, nontender, nondistended, positive bowel sounds Extremities: No edema, No lymphangitis, no petechiae  Discharge Instructions      Discharge Instructions    Diet - low sodium heart healthy    Complete by:  As directed      Increase activity slowly    Complete by:  As directed             Medication List    STOP taking these medications        glucose blood test strip  Commonly known as:  ONE TOUCH ULTRA TEST     metFORMIN 500 MG 24 hr tablet  Commonly known as:  GLUCOPHAGE-XR      TAKE these medications        albuterol 108 (90 BASE) MCG/ACT inhaler  Commonly known as:  PROAIR HFA  Inhale 2 puffs into the lungs every 4 (four) hours as needed for wheezing or shortness of breath.     aspirin 81 MG EC tablet  Take 1 tablet (81 mg total) by mouth daily.     ibuprofen 200 MG tablet  Commonly known as:  ADVIL,MOTRIN  Take  400 mg by mouth every 6 (six) hours as needed for moderate pain.     insulin NPH-regular Human (70-30) 100 UNIT/ML injection  Commonly known as:  NOVOLIN 70/30  Inject 15 unit in am,15 unit at noon and 15 unit at night with each meal. Hold if CBG is less than 70     onetouch ultrasoft lancets  Use as instructed     traZODone 50 MG tablet  Commonly known as:  DESYREL  Take 1 tablet (50 mg total) by mouth 2 (two) times daily.         The results of significant diagnostics from this hospitalization (including imaging, microbiology, ancillary and laboratory) are listed below for reference.    Significant Diagnostic Studies: Dg Chest Portable 1 View  12/21/2014   CLINICAL DATA:  Unresponsive, hyperglycemia  EXAM: PORTABLE CHEST - 1 VIEW  COMPARISON:  11/15/2013  FINDINGS: The heart size and mediastinal contours are within normal limits. Both lungs are clear. The visualized skeletal structures are unremarkable.  IMPRESSION: No active disease.   Electronically Signed   By: Judie Petit.  Shick M.D.   On: 12/21/2014 08:08     Microbiology: Recent Results (from the past 240 hour(s))  Culture, blood (routine x 2)     Status: None (Preliminary result)   Collection Time: 12/21/14  9:00 AM  Result Value Ref Range Status   Specimen Description BLOOD RIGHT HAND  Final   Special Requests BOTTLES DRAWN AEROBIC AND ANAEROBIC 5CC  Final   Culture   Final           BLOOD CULTURE RECEIVED NO GROWTH TO DATE CULTURE WILL BE HELD FOR 5 DAYS BEFORE ISSUING A FINAL NEGATIVE REPORT Performed at [facility]    Report Status PENDING  Incomplete  Culture, blood (routine x 2)     Status: None (Preliminary result)   Collection Time: 12/21/14  9:05 AM  Result Value Ref Range Status   Specimen Description BLOOD LEFT HAND  Final   Special Requests BOTTLES DRAWN AEROBIC AND ANAEROBIC 5CCS  Final   Culture   Final           BLOOD CULTURE RECEIVED NO GROWTH TO DATE CULTURE WILL BE HELD FOR 5 DAYS BEFORE ISSUING A  FINAL NEGATIVE REPORT Performed at [facility]    Report Status PENDING  Incomplete  MRSA PCR Screening     Status: None   Collection Time: 12/21/14 12:28 PM  Result Value Ref Range Status   MRSA by PCR NEGATIVE NEGATIVE Final    Comment:        The GeneXpert MRSA Assay (FDA approved for NASAL specimens only), is one component of a comprehensive MRSA colonization surveillance program. It is not intended to diagnose MRSA infection nor to guide or monitor treatment for MRSA infections.      Labs: Basic Metabolic Panel:  Recent  Labs Lab 12/21/14 0900 12/21/14 1226 12/21/14 1639 12/21/14 2221 12/22/14 0137 12/23/14 0331  NA 143 146* 143 146* 145 138  K 5.8* 4.8 4.4 4.2 3.4* 3.1*  CL 109 115* 114* 121* 117* 111  CO2 7* 10* 14* 16* 17* 25  GLUCOSE 559* 300* 186* 133* 172* 77  BUN 33* 27* <5*  CREATININE 1.99* 1.86* 1.31* 0.87 0.83 0.58  CALCIUM 7.8* 7.8* 8.0* 7.2* 7.7* 7.9*  MG 2.2  --   --   --  1.7 1.7  PHOS 8.7*  --   --   --  2.0* 1.9*   Liver Function Tests:  Recent Labs Lab 12/21/14 0725  AST 20  ALT 19  ALKPHOS 151*  BILITOT 2.0*  PROT 7.5  ALBUMIN 3.5   No results for input(s): LIPASE, AMYLASE in the last 168 hours. No results for input(s): AMMONIA in the last 168 hours. CBC:  Recent Labs Lab 12/21/14 0725 12/22/14 0137  WBC 18.7* 9.2  NEUTROABS 15.0*  --   HGB 14.7 11.7*  HCT 46.1* 35.4*  MCV 106.0* 99.2  PLT 467* 232   Cardiac Enzymes: No results for input(s): CKTOTAL, CKMB, CKMBINDEX, TROPONINI in the last 168 hours. BNP: Invalid input(s): POCBNP CBG:  Recent Labs Lab 12/22/14 2126 12/22/14 2316 12/23/14 0409 12/23/14 0817 12/23/14 1139  GLUCAP 109* 91 82 100* 248*    Time coordinating discharge:  Greater than 30 minutes  Signed:  Sabrin Dunlevy, DO [facility] Pager: [phone] 12/23/2014, 1:08 PM

## 2014-12-27 LAB — CULTURE, BLOOD (ROUTINE X 2)
CULTURE: NO GROWTH
Culture: NO GROWTH

## 2015-02-09 ENCOUNTER — Encounter (HOSPITAL_COMMUNITY): Payer: Self-pay | Admitting: Emergency Medicine

## 2015-02-09 ENCOUNTER — Inpatient Hospital Stay (HOSPITAL_COMMUNITY)
Admission: EM | Admit: 2015-02-09 | Discharge: 2015-02-11 | DRG: 638 | Disposition: A | Discharge status: Home / Self Care | Payer: Self-pay | Attending: Family Medicine | Admitting: Family Medicine

## 2015-02-09 DIAGNOSIS — F17200 Nicotine dependence, unspecified, uncomplicated: Secondary | ICD-10-CM | POA: Diagnosis present

## 2015-02-09 DIAGNOSIS — F17210 Nicotine dependence, cigarettes, uncomplicated: Secondary | ICD-10-CM | POA: Diagnosis present

## 2015-02-09 DIAGNOSIS — Z716 Tobacco abuse counseling: Secondary | ICD-10-CM | POA: Diagnosis present

## 2015-02-09 DIAGNOSIS — E8729 Other acidosis: Secondary | ICD-10-CM | POA: Diagnosis present

## 2015-02-09 DIAGNOSIS — E872 Acidosis, unspecified: Secondary | ICD-10-CM

## 2015-02-09 DIAGNOSIS — I252 Old myocardial infarction: Secondary | ICD-10-CM

## 2015-02-09 DIAGNOSIS — N760 Acute vaginitis: Secondary | ICD-10-CM | POA: Diagnosis present

## 2015-02-09 DIAGNOSIS — J45909 Unspecified asthma, uncomplicated: Secondary | ICD-10-CM | POA: Diagnosis present

## 2015-02-09 DIAGNOSIS — Z720 Tobacco use: Secondary | ICD-10-CM

## 2015-02-09 DIAGNOSIS — Z9114 Patient's other noncompliance with medication regimen: Secondary | ICD-10-CM | POA: Diagnosis present

## 2015-02-09 DIAGNOSIS — N179 Acute kidney failure, unspecified: Secondary | ICD-10-CM | POA: Diagnosis present

## 2015-02-09 DIAGNOSIS — IMO0002 Reserved for concepts with insufficient information to code with codable children: Secondary | ICD-10-CM

## 2015-02-09 DIAGNOSIS — E860 Dehydration: Secondary | ICD-10-CM | POA: Diagnosis present

## 2015-02-09 DIAGNOSIS — F329 Major depressive disorder, single episode, unspecified: Secondary | ICD-10-CM | POA: Diagnosis present

## 2015-02-09 DIAGNOSIS — E138 Other specified diabetes mellitus with unspecified complications: Secondary | ICD-10-CM

## 2015-02-09 DIAGNOSIS — Z794 Long term (current) use of insulin: Secondary | ICD-10-CM

## 2015-02-09 DIAGNOSIS — Z7982 Long term (current) use of aspirin: Secondary | ICD-10-CM

## 2015-02-09 DIAGNOSIS — B379 Candidiasis, unspecified: Secondary | ICD-10-CM | POA: Diagnosis present

## 2015-02-09 DIAGNOSIS — G43909 Migraine, unspecified, not intractable, without status migrainosus: Secondary | ICD-10-CM | POA: Diagnosis present

## 2015-02-09 DIAGNOSIS — E1065 Type 1 diabetes mellitus with hyperglycemia: Secondary | ICD-10-CM

## 2015-02-09 DIAGNOSIS — E1010 Type 1 diabetes mellitus with ketoacidosis without coma: Principal | ICD-10-CM | POA: Diagnosis present

## 2015-02-09 DIAGNOSIS — K219 Gastro-esophageal reflux disease without esophagitis: Secondary | ICD-10-CM | POA: Diagnosis present

## 2015-02-09 LAB — COMPREHENSIVE METABOLIC PANEL
ALK PHOS: 94 U/L (ref 38–126)
ALT: 18 U/L (ref 14–54)
AST: 25 U/L (ref 15–41)
Albumin: 4.6 g/dL (ref 3.5–5.0)
Anion gap: 33 — ABNORMAL HIGH (ref 5–15)
BILIRUBIN TOTAL: 2.8 mg/dL — AB (ref 0.3–1.2)
BUN: 43 mg/dL — ABNORMAL HIGH (ref 6–20)
CHLORIDE: 93 mmol/L — AB (ref 101–111)
CO2: 9 mmol/L — AB (ref 22–32)
Calcium: 8.9 mg/dL (ref 8.9–10.3)
Creatinine, Ser: 1.64 mg/dL — ABNORMAL HIGH (ref 0.44–1.00)
GFR calc Af Amer: 47 mL/min — ABNORMAL LOW (ref >60)
GFR, EST NON AFRICAN AMERICAN: 41 mL/min — AB (ref >60)
GLUCOSE: 602 mg/dL — AB (ref 65–99)
Potassium: 5.6 mmol/L — ABNORMAL HIGH (ref 3.5–5.1)
Sodium: 135 mmol/L (ref 135–145)
Total Protein: 7.9 g/dL (ref 6.5–8.1)

## 2015-02-09 LAB — URINALYSIS, ROUTINE W REFLEX MICROSCOPIC
Bilirubin Urine: NEGATIVE
Glucose, UA: >1000 mg/dL — AB
Hgb urine dipstick: NEGATIVE
Ketones, ur: >80 mg/dL — AB
Nitrite: NEGATIVE
PROTEIN: NEGATIVE mg/dL
Specific Gravity, Urine: 1.023 (ref 1.005–1.030)
UROBILINOGEN UA: 0.2 mg/dL (ref 0.0–1.0)
pH: 5 (ref 5.0–8.0)

## 2015-02-09 LAB — URINE MICROSCOPIC-ADD ON

## 2015-02-09 LAB — CBG MONITORING, ED
GLUCOSE-CAPILLARY: 576 mg/dL — AB (ref 65–99)
GLUCOSE-CAPILLARY: 495 mg/dL — AB (ref 65–99)
GLUCOSE-CAPILLARY: 307 mg/dL — AB (ref 65–99)
Glucose-Capillary: 236 mg/dL — ABNORMAL HIGH (ref 65–99)

## 2015-02-09 LAB — BLOOD GAS, VENOUS
ACID-BASE DEFICIT: 18.6 mmol/L — AB (ref 0.0–2.0)
BICARBONATE: 10.5 meq/L — AB (ref 20.0–24.0)
O2 SAT: 51.7 %
PATIENT TEMPERATURE: 98.6
TCO2: 9.8 mmol/L (ref 0–100)
pCO2, Ven: 33.4 mmHg — ABNORMAL LOW (ref 45.0–50.0)
pH, Ven: 7.127 — CL (ref 7.250–7.300)
pO2, Ven: 36.1 mmHg (ref 30.0–45.0)

## 2015-02-09 LAB — CBC
HCT: 48.1 % — ABNORMAL HIGH (ref 36.0–46.0)
Hemoglobin: 15.4 g/dL — ABNORMAL HIGH (ref 12.0–15.0)
MCH: 32.2 pg (ref 26.0–34.0)
MCHC: 32 g/dL (ref 30.0–36.0)
MCV: 100.4 fL — ABNORMAL HIGH (ref 78.0–100.0)
Platelets: 287 10*3/uL (ref 150–400)
RBC: 4.79 MIL/uL (ref 3.87–5.11)
RDW: 13.6 % (ref 11.5–15.5)
WBC: 9 10*3/uL (ref 4.0–10.5)

## 2015-02-09 LAB — GLUCOSE, CAPILLARY
Glucose-Capillary: 262 mg/dL — ABNORMAL HIGH (ref 65–99)
Glucose-Capillary: 215 mg/dL — ABNORMAL HIGH (ref 65–99)

## 2015-02-09 MED ORDER — DEXTROSE 50 % IV SOLN: 25.0000 mL | INTRAVENOUS | Status: DC | PRN

## 2015-02-09 MED ORDER — SODIUM CHLORIDE 0.9 % IV BOLUS (SEPSIS)
1000.0000 mL | Freq: Once | INTRAVENOUS | Status: AC
Start: 2015-02-09 — End: 2015-02-09
  Administered 2015-02-09: 1000 mL via INTRAVENOUS

## 2015-02-09 MED ORDER — ALBUTEROL SULFATE (2.5 MG/3ML) 0.083% IN NEBU: 3.0000 mL | INHALATION_SOLUTION | RESPIRATORY_TRACT | Status: DC | PRN

## 2015-02-09 MED ORDER — HEPARIN SODIUM (PORCINE) 5000 UNIT/ML IJ SOLN
5000.0000 [IU] | Freq: Three times a day (TID) | INTRAMUSCULAR | Status: DC
  Administered 2015-02-10 (×2): 5000 [IU] via SUBCUTANEOUS
  Filled 2015-02-09 (×7): qty 1

## 2015-02-09 MED ORDER — METOCLOPRAMIDE HCL 5 MG/ML IJ SOLN
10.0000 mg | Freq: Once | INTRAMUSCULAR | Status: AC
  Administered 2015-02-09: 10 mg via INTRAVENOUS
  Filled 2015-02-09: qty 2

## 2015-02-09 MED ORDER — ONDANSETRON HCL 4 MG/2ML IJ SOLN: 4.0000 mg | Freq: Four times a day (QID) | INTRAMUSCULAR | Status: DC | PRN

## 2015-02-09 MED ORDER — SODIUM CHLORIDE 0.9 % IV SOLN: INTRAVENOUS | Status: DC

## 2015-02-09 MED ORDER — NICOTINE 7 MG/24HR TD PT24
7.0000 mg | MEDICATED_PATCH | Freq: Every day | TRANSDERMAL | Status: DC
  Filled 2015-02-09 (×2): qty 1

## 2015-02-09 MED ORDER — SODIUM CHLORIDE 0.9 % IV BOLUS (SEPSIS)
2000.0000 mL | Freq: Once | INTRAVENOUS | Status: AC
  Administered 2015-02-09: 2000 mL via INTRAVENOUS

## 2015-02-09 MED ORDER — DIPHENHYDRAMINE HCL 50 MG/ML IJ SOLN
25.0000 mg | Freq: Once | INTRAMUSCULAR | Status: AC
  Administered 2015-02-09: 25 mg via INTRAVENOUS
  Filled 2015-02-09: qty 1

## 2015-02-09 MED ORDER — INSULIN REGULAR BOLUS VIA INFUSION
0.0000 [IU] | Freq: Three times a day (TID) | INTRAVENOUS | Status: DC
  Filled 2015-02-09: qty 10

## 2015-02-09 MED ORDER — ACETAMINOPHEN 325 MG PO TABS
650.0000 mg | ORAL_TABLET | Freq: Four times a day (QID) | ORAL | Status: DC | PRN
  Administered 2015-02-10 – 2015-02-11 (×3): 650 mg via ORAL
  Filled 2015-02-09 (×3): qty 2

## 2015-02-09 MED ORDER — SODIUM CHLORIDE 0.9 % IV SOLN
INTRAVENOUS | Status: DC
  Administered 2015-02-09: 4.4 [IU]/h via INTRAVENOUS
  Filled 2015-02-09: qty 2.5

## 2015-02-09 MED ORDER — ACETAMINOPHEN 650 MG RE SUPP: 650.0000 mg | Freq: Four times a day (QID) | RECTAL | Status: DC | PRN

## 2015-02-09 MED ORDER — DEXTROSE-NACL 5-0.45 % IV SOLN
INTRAVENOUS | Status: DC
  Administered 2015-02-09: 21:00:00 via INTRAVENOUS

## 2015-02-09 MED ORDER — DEXTROSE-NACL 5-0.45 % IV SOLN
INTRAVENOUS | Status: DC
  Administered 2015-02-09: 23:00:00 via INTRAVENOUS
  Administered 2015-02-10: 100 mL/h via INTRAVENOUS

## 2015-02-09 MED ORDER — FLUCONAZOLE 150 MG PO TABS
150.0000 mg | ORAL_TABLET | Freq: Once | ORAL | Status: AC
  Administered 2015-02-10: 150 mg via ORAL
  Filled 2015-02-09: qty 1

## 2015-02-09 MED ORDER — ASPIRIN 81 MG PO CHEW
81.0000 mg | CHEWABLE_TABLET | Freq: Every day | ORAL | Status: DC
  Administered 2015-02-10 – 2015-02-11 (×2): 81 mg via ORAL
  Filled 2015-02-09 (×2): qty 1

## 2015-02-09 MED ORDER — ONDANSETRON HCL 4 MG PO TABS: 4.0000 mg | ORAL_TABLET | Freq: Four times a day (QID) | ORAL | Status: DC | PRN

## 2015-02-09 MED ORDER — SODIUM CHLORIDE 0.9 % IV SOLN
INTRAVENOUS | Status: AC
  Administered 2015-02-09: 6.1 [IU]/h via INTRAVENOUS
  Filled 2015-02-09: qty 2.5

## 2015-02-09 NOTE — ED Notes (Signed)
Pt called out and when MD Criss AlvineGoldston approached, pt stated "If I don't get a bedpan right now I will pee all over this damn bed." This was the first time pt. Had called out.

## 2015-02-09 NOTE — ED Notes (Signed)
Per EMS< Pt from home, c/o "feeling bad" since this morning. Checked her sugar and it was high. Pt c/o vomiting. Has hx of issues with hyperglycemia. Denies oral intake since yesterday and feels dehydrated. CBG 536 with EMS. Pt received 600 mL NS by EMS. Also given 4 mg zofran.

## 2015-02-09 NOTE — ED Notes (Signed)
Bed: WA08 Expected date:  Expected time:  Means of arrival:  Comments: EMS/hyperglycemic >500

## 2015-02-09 NOTE — ED Notes (Signed)
Pt ambulated to BR with standby assistance.

## 2015-02-09 NOTE — H&P (Signed)
Family Medicine Teaching [facility] Admission History and Physical Service Pager: [phone]  Patient name: Kristine Allison Medical record number: [id number] Date of birth: 1980/11/16 Age: 34 y.o. Gender: female  Primary Care Provider: Janit PaganENIOLA, KEHINDE, MD Consultants: none Code Status: FULL (discussed on admission)  Chief Complaint: DKA  Assessment and Plan: Kristine Allison is a 34 y.o. female presenting with DKA . 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 [facility]  Metabolic acidosis on VBG with pH 7.127, pCO2 33.4, Bicarb 10.5. K 5.6, Glucose 602, AG 33. Cr 1.64. UA >1000 glucose, >80 ketones, SG 1.023, small Leuks, yeast -Admit to SDU under Dr Lum BabeEniola  -VS per floor protocol -Glucostabilizer protocol w/ q4 BMET -Trend AG and K -NS@100cc /h will transition to D5 1/2NS when CBG< 150 -Will transition to sub-q insulin when AG closed  #ARF syndrome  - likely secondary to dehydration in setting of DKA - rehydrating and treating DKA as above - monitor serum Cr  #Tobacco dependence - counseled briefly on cessation - Nicotine patch if pt requests  #Yeast infection: presumed. Yeast found in UA.  No urinary symptoms.  Likely vaginitis 2/2 hyperglycemia. -Diflucan 150mg  PO x1  FEN/GI: IVF as above Prophylaxis: heparin subQ  Disposition: Admit to inpt, Dr. Lum BabeEniola, with management as above; likely discharge home after that  History of Present Illness: Kristine Allison is a 34 y.o. female [facility]. She reports that symptoms began to get worse today.  She woke up with a headache, nausea and vomiting.  She reports that she has not been able to eat well x2 days because she vomits everything up.  She reports compliance with insulin, stating that she takes 20  units of Novolog TID and takes Metformin "2 tablets each morning".  She takes no other medications.  She reports last A1c being 12 or 14.  She denies CP, SOB, pre-syncope/syncope, diarrhea, dysuria, hematuria, cough, congestion, recent illness.  She reports a normal voiding and stooling.  She reports being an active smoker 1 pack q 2-3 days.  She denies drug use, ETOH.  She works at [employer] and was able to work up until this am.  She reports no difficulty obtaining her medications.   Review Of Systems: Per HPI with the following additions: none Otherwise 12 point review of systems was performed and was unremarkable.  Patient Active Problem List   Diagnosis Date Noted  . DKA, type 1 02/09/2015  . Acute renal failure syndrome   . Diabetic ketoacidosis with coma associated with type 1 diabetes mellitus   . Metabolic acidosis, increased anion gap   . Diabetic ketoacidosis 12/21/2014  . Tachycardia 11/03/2014  . Insomnia 09/08/2014  . Dizziness 09/08/2014  . DKA, type 2 08/20/2014  . DM (diabetes mellitus) type 1, uncontrolled, with ketoacidosis 08/20/2014  . DKA, type 1, not at goal 08/20/2014  . Migraine, unspecified 12/14/2013  . Elevated troponin I level 11/19/2013  . Acute myocardial infarction, subendocardial infarction, initial episode of care 11/18/2013  . Noncompliance with medication regimen 11/15/2013  . Leg pain 09/08/2013  . Loss of weight 09/08/2013  . Depression 09/08/2013  . Hypotension, unspecified 09/08/2013  . Tobacco use disorder 08/01/2013  . Diabetes mellitus 06/24/2012  . GERD (gastroesophageal reflux disease) 06/17/2012   Past  Medical History: Past Medical History  Diagnosis Date  . Asthma   . Diabetes mellitus without complication   . ZOXWRUEA(540.9)    Past Surgical History: Past Surgical History  Procedure Laterality Date  . No past surgeries     Social History: History  Substance Use Topics  . Smoking status: Current Every Day Smoker -- 0.25  packs/day for 20 years    Types: Cigarettes  . Smokeless tobacco: Never Used  . Alcohol Use: No   Additional social history: smoker, no drug or ETOH, works at [employer]  Please also refer to relevant sections of EMR.  Family History: Family History  Problem Relation Age of Onset  . Heart disease      No family history   Allergies and Medications: Allergies  Allergen Reactions  . Latex Rash   No current facility-administered medications on file prior to encounter.   Current Outpatient Prescriptions on File Prior to Encounter  Medication Sig Dispense Refill  . insulin NPH-regular Human (NOVOLIN 70/30) (70-30) 100 UNIT/ML injection Inject 15 unit in am,15 unit at noon and 15 unit at night with each meal. Hold if CBG is less than 70 (Patient taking differently: Inject 15 units in am, 20 sat noon and 15 units at night with each meal. Hold if CBG is less than 70) 10 mL 4  . Lancets (ONETOUCH ULTRASOFT) lancets Use as instructed 100 each 12  . albuterol (PROAIR HFA) 108 (90 BASE) MCG/ACT inhaler Inhale 2 puffs into the lungs every 4 (four) hours as needed for wheezing or shortness of breath. 3.7 g 0  . aspirin EC 81 MG EC tablet Take 1 tablet (81 mg total) by mouth daily. (Patient not taking: Reported on 02/09/2015) 30 tablet 11  . traZODone (DESYREL) 50 MG tablet Take 1 tablet (50 mg total) by mouth 2 (two) times daily. (Patient not taking: Reported on 02/09/2015) 60 tablet 4    Objective: BP 106/44 mmHg  Pulse 113  Temp(Src) 97.7 F (36.5 C) (Oral)  Resp 20  SpO2 95% Exam: General: alert, NAD, lying in bed, thinning hair and pale appearing Eyes: EOMI ENTM: dry MM Cardiovascular: RRR, no murmurs appreciated Respiratory: globally decreased BS, no increased WOB Abdomen: flat, soft, NT/ND, +BS MSK: moves extremities independently, no edema, +2DP Skin: no rashes, skin is dry and intact Neuro: no focal deficits, follows commands Psych: speech normal, mood stable  Labs and  Imaging:  Results for orders placed or performed during the hospital encounter of 02/09/15 (from the past 24 hour(s))  CBG monitoring, ED     Status: Abnormal   Collection Time: 02/09/15  4:34 PM  Result Value Ref Range   Glucose-Capillary 576 (HH) 65 - 99 mg/dL   Comment 1 Notify RN   Urinalysis, Routine w reflex microscopic     Status: Abnormal   Collection Time: 02/09/15  4:41 PM  Result Value Ref Range   Color, Urine YELLOW YELLOW   APPearance CLEAR CLEAR   Specific Gravity, Urine 1.023 1.005 - 1.030   pH 5.0 5.0 - 8.0   Glucose, UA >1000 (A) NEGATIVE mg/dL   Hgb urine dipstick NEGATIVE NEGATIVE   Bilirubin Urine NEGATIVE NEGATIVE   Ketones, ur >80 (A) NEGATIVE mg/dL   Protein, ur NEGATIVE NEGATIVE mg/dL   Urobilinogen, UA 0.2 0.0 - 1.0 mg/dL   Nitrite NEGATIVE NEGATIVE   Leukocytes, UA SMALL (A) NEGATIVE  Urine microscopic-add on     Status: None   Collection Time: 02/09/15  4:41 PM  Result  Value Ref Range   Squamous Epithelial / LPF RARE RARE   WBC, UA 0-2 <3 WBC/hpf   Urine-Other YEAST   CBC     Status: Abnormal   Collection Time: 02/09/15  5:03 PM  Result Value Ref Range   WBC 9.0 4.0 - 10.5 K/uL   RBC 4.79 3.87 - 5.11 MIL/uL   Hemoglobin 15.4 (H) 12.0 - 15.0 g/dL   HCT 16.148.1 (H) 09.636.0 - 04.546.0 %   MCV 100.4 (H) 78.0 - 100.0 fL   MCH 32.2 26.0 - 34.0 pg   MCHC 32.0 30.0 - 36.0 g/dL   RDW 40.913.6 81.111.5 - 91.415.5 %   Platelets 287 150 - 400 K/uL  Comprehensive metabolic panel     Status: Abnormal   Collection Time: 02/09/15  5:03 PM  Result Value Ref Range   Sodium 135 135 - 145 mmol/L   Potassium 5.6 (H) 3.5 - 5.1 mmol/L   Chloride 93 (L) 101 - 111 mmol/L   CO2 9 (L) 22 - 32 mmol/L   Glucose, Bld 602 (HH) 65 - 99 mg/dL   BUN 43 (H) 6 - 20 mg/dL   Creatinine, Ser 7.821.64 (H) 0.44 - 1.00 mg/dL   Calcium 8.9 8.9 - 95.610.3 mg/dL   Total Protein 7.9 6.5 - 8.1 g/dL   Albumin 4.6 3.5 - 5.0 g/dL   AST 25 15 - 41 U/L   ALT 18 14 - 54 U/L   Alkaline Phosphatase 94 38 - 126 U/L    Total Bilirubin 2.8 (H) 0.3 - 1.2 mg/dL   GFR calc non Af Amer 41 (L) >60 mL/min   GFR calc Af Amer 47 (L) >60 mL/min   Anion gap 33 (H) 5 - 15  Blood gas, venous     Status: Abnormal   Collection Time: 02/09/15  5:03 PM  Result Value Ref Range   pH, Ven 7.127 (LL) 7.250 - 7.300   pCO2, Ven 33.4 (L) 45.0 - 50.0 mmHg   pO2, Ven 36.1 30.0 - 45.0 mmHg   Bicarbonate 10.5 (L) 20.0 - 24.0 mEq/L   TCO2 9.8 0 - 100 mmol/L   Acid-base deficit 18.6 (H) 0.0 - 2.0 mmol/L   O2 Saturation 51.7 %   Patient temperature 98.6    Collection site COLLECTED BY LABORATORY    Drawn by COLLECTED BY LABORATORY    Sample type VEIN   POC CBG, ED     Status: Abnormal   Collection Time: 02/09/15  6:44 PM  Result Value Ref Range   Glucose-Capillary 495 (H) 65 - 99 mg/dL   Comment 1 Notify RN   CBG monitoring, ED     Status: Abnormal   Collection Time: 02/09/15  8:08 PM  Result Value Ref Range   Glucose-Capillary 307 (H) 65 - 99 mg/dL   Comment 1 Notify RN   CBG monitoring, ED     Status: Abnormal   Collection Time: 02/09/15  9:20 PM  Result Value Ref Range   Glucose-Capillary 236 (H) 65 - 99 mg/dL   Comment 1 Notify RN    No results found.  Raliegh IpAshly M Gottschalk, DO 02/09/2015, 10:24 PM PGY-1, [facility] FPTS Intern pager: [phone], text pages welcome  The above note was started by Dr. Nadine CountsGottschalk and was completed by myself. The history, exam findings, and A/P were elicited and generated by Dr. Nadine CountsGottschalk and myself as a team.  Bobbye Mortonhristopher M Street, MD PGY-3, [facility] 02/10/2015, 6:21 AM

## 2015-02-09 NOTE — ED Provider Notes (Addendum)
CSN: [id number]     Arrival date & time 02/09/15  1622 History   First MD Initiated Contact with Patient 02/09/15 1649     Chief Complaint  Patient presents with  . Hyperglycemia     (Consider location/radiation/quality/duration/timing/severity/associated sxs/prior Treatment) HPI  34 year old female with a history of diabetes on insulin presents with recurrent nausea, vomiting and hyperglycemia.  States she woke up at around midnight with nausea and vomiting and has been unable to keep down any fluids. Given Zofran by EMS and feels better but feels like she has a very dry mouth. Is having a worse headache than typical but states this headache comes on whenever she is in DKA or having severe nausea and vomiting. Denies weakness or numbness. No abdominal pain. Patient states that her sugar has been over 500 today but was "normal" yesterday. No blurry vision.  Past Medical History  Diagnosis Date  . Asthma   . Diabetes mellitus without complication   . TDDUKGUR(427.0Headache(784.0)    Past Surgical History  Procedure Laterality Date  . No past surgeries     Family History  Problem Relation Age of Onset  . Heart disease      No family history   History  Substance Use Topics  . Smoking status: Current Every Day Smoker -- 0.25 packs/day for 20 years    Types: Cigarettes  . Smokeless tobacco: Never Used  . Alcohol Use: No   OB History    No data available     Review of Systems  Constitutional: Negative for fever.  Respiratory: Negative for shortness of breath.   Gastrointestinal: Positive for nausea and vomiting. Negative for abdominal pain and diarrhea.  Genitourinary: Negative for dysuria.  Neurological: Positive for headaches. Negative for weakness.  All other systems reviewed and are negative.     Allergies  Latex  Home Medications   Prior to Admission medications   Medication Sig Start Date End Date Taking? Authorizing Provider  insulin NPH-regular Human (NOVOLIN 70/30)  (70-30) 100 UNIT/ML injection Inject 15 unit in am,15 unit at noon and 15 unit at night with each meal. Hold if CBG is less than 70 Patient taking differently: Inject 15 units in am, 20 sat noon and 15 units at night with each meal. Hold if CBG is less than 70 10/25/14  Yes Doreene ElandKehinde T Eniola, MD  Lancets (ONETOUCH ULTRASOFT) lancets Use as instructed 09/08/14  Yes Doreene ElandKehinde T Eniola, MD  albuterol (PROAIR HFA) 108 (90 BASE) MCG/ACT inhaler Inhale 2 puffs into the lungs every 4 (four) hours as needed for wheezing or shortness of breath. 12/23/14   Catarina Hartshornavid Tat, MD  aspirin EC 81 MG EC tablet Take 1 tablet (81 mg total) by mouth daily. Patient not taking: Reported on 02/09/2015 11/20/13   Nani RavensAndrew M Wight, MD  traZODone (DESYREL) 50 MG tablet Take 1 tablet (50 mg total) by mouth 2 (two) times daily. Patient not taking: Reported on 02/09/2015 10/31/14   Doreene ElandKehinde T Eniola, MD   BP 122/65 mmHg  Pulse 128  Temp(Src) 97.7 F (36.5 C) (Oral)  Resp 22  SpO2 99% Physical Exam  Constitutional: She is oriented to person, place, and time. She appears well-developed and well-nourished.  HENT:  Head: Normocephalic and atraumatic.  Right Ear: External ear normal.  Left Ear: External ear normal.  Nose: Nose normal.  Mouth/Throat: Mucous membranes are dry.  Eyes: EOM are normal. Pupils are equal, round, and reactive to light. Right eye exhibits no discharge. Left eye  exhibits no discharge.  Cardiovascular: Normal rate, regular rhythm and normal heart sounds.   Pulmonary/Chest: Effort normal and breath sounds normal.  Abdominal: Soft. She exhibits no distension. There is no tenderness.  Neurological: She is alert and oriented to person, place, and time.  CN II-12 grossly intact. 5/5 strength in all 4 extremities.  Skin: Skin is warm and dry.  Vitals reviewed.   ED Course  Procedures (including critical care time) Labs Review Labs Reviewed  CBC - Abnormal; Notable for the following:    Hemoglobin 15.4 (*)    HCT  48.1 (*)    MCV 100.4 (*)    All other components within normal limits  COMPREHENSIVE METABOLIC PANEL - Abnormal; Notable for the following:    Potassium 5.6 (*)    Chloride 93 (*)    CO2 9 (*)    Glucose, Bld 602 (*)    BUN 43 (*)    Creatinine, Ser 1.64 (*)    Total Bilirubin 2.8 (*)    GFR calc non Af Amer 41 (*)    GFR calc Af Amer 47 (*)    Anion gap 33 (*)    All other components within normal limits  URINALYSIS, ROUTINE W REFLEX MICROSCOPIC - Abnormal; Notable for the following:    Glucose, UA >1000 (*)    Ketones, ur >80 (*)    Leukocytes, UA SMALL (*)    All other components within normal limits  BLOOD GAS, VENOUS - Abnormal; Notable for the following:    pH, Ven 7.127 (*)    pCO2, Ven 33.4 (*)    Bicarbonate 10.5 (*)    Acid-base deficit 18.6 (*)    All other components within normal limits  GLUCOSE, CAPILLARY - Abnormal; Notable for the following:    Glucose-Capillary 262 (*)    All other components within normal limits  GLUCOSE, CAPILLARY - Abnormal; Notable for the following:    Glucose-Capillary 215 (*)    All other components within normal limits  CBG MONITORING, ED - Abnormal; Notable for the following:    Glucose-Capillary 576 (*)    All other components within normal limits  CBG MONITORING, ED - Abnormal; Notable for the following:    Glucose-Capillary 495 (*)    All other components within normal limits  CBG MONITORING, ED - Abnormal; Notable for the following:    Glucose-Capillary 307 (*)    All other components within normal limits  CBG MONITORING, ED - Abnormal; Notable for the following:    Glucose-Capillary 236 (*)    All other components within normal limits  MRSA PCR SCREENING  URINE MICROSCOPIC-ADD ON  CBC  BASIC METABOLIC PANEL  BASIC METABOLIC PANEL  BASIC METABOLIC PANEL  BASIC METABOLIC PANEL  CBC  HEMOGLOBIN A1C  BASIC METABOLIC PANEL  BASIC METABOLIC PANEL    Imaging Review No results found.   EKG Interpretation None       CRITICAL CARE Performed by: Pricilla Loveless T   Total critical care time: 30 minutes  Critical care time was exclusive of separately billable procedures and treating other patients.  Critical care was necessary to treat or prevent imminent or life-threatening deterioration.  Critical care was time spent personally by me on the following activities: development of treatment plan with patient and/or surrogate as well as nursing, discussions with consultants, evaluation of patient's response to treatment, examination of patient, obtaining history from patient or surrogate, ordering and performing treatments and interventions, ordering and review of laboratory studies, ordering and review  of radiographic studies, pulse oximetry and re-evaluation of patient's condition.  MDM   Final diagnoses:  Type 1 diabetes mellitus with ketoacidosis and without coma  Metabolic acidosis    Patient symptoms consistent with acute DKA. Vomiting and nausea have been controlled with IV medications and she was resuscitated with IV fluids. After this an insulin drip was started. No potassium replacement at first due to potassium over 5 on initial BMP. Patient will need admission to the stepdown unit given the degree of her acidosis. However she is awake and alert and currently has no hypotension. Patient is a family medicine patient, discussed with the resident on call, Dr. Casper HarrisonStreet, who [facility]    Pricilla LovelessScott Lavon Horn, MD 02/09/15 [phone]  Pricilla LovelessScott Leialoha Hanna, MD 02/09/15 [phone]

## 2015-02-10 LAB — BASIC METABOLIC PANEL
ANION GAP: 8 (ref 5–15)
ANION GAP: 14 (ref 5–15)
Anion gap: 9 (ref 5–15)
Anion gap: 9 (ref 5–15)
BUN: 20 mg/dL (ref 6–20)
BUN: 16 mg/dL (ref 6–20)
BUN: 13 mg/dL (ref 6–20)
BUN: 24 mg/dL — AB (ref 6–20)
CALCIUM: 7.7 mg/dL — AB (ref 8.9–10.3)
CALCIUM: 8.4 mg/dL — AB (ref 8.9–10.3)
CALCIUM: 8.1 mg/dL — AB (ref 8.9–10.3)
CHLORIDE: 109 mmol/L (ref 101–111)
CO2: 17 mmol/L — ABNORMAL LOW (ref 22–32)
CO2: 20 mmol/L — ABNORMAL LOW (ref 22–32)
CO2: 21 mmol/L — AB (ref 22–32)
CO2: 20 mmol/L — ABNORMAL LOW (ref 22–32)
Calcium: 8.3 mg/dL — ABNORMAL LOW (ref 8.9–10.3)
Chloride: 107 mmol/L (ref 101–111)
Chloride: 106 mmol/L (ref 101–111)
Chloride: 106 mmol/L (ref 101–111)
Creatinine, Ser: 1.17 mg/dL — ABNORMAL HIGH (ref 0.44–1.00)
Creatinine, Ser: 0.88 mg/dL (ref 0.44–1.00)
Creatinine, Ser: 0.78 mg/dL (ref 0.44–1.00)
Creatinine, Ser: 0.79 mg/dL (ref 0.44–1.00)
GFR calc Af Amer: >60 mL/min (ref >60)
GFR calc Af Amer: >60 mL/min (ref >60)
GFR calc Af Amer: >60 mL/min (ref >60)
GFR calc non Af Amer: >60 mL/min (ref >60)
GFR calc non Af Amer: >60 mL/min (ref >60)
GFR calc non Af Amer: >60 mL/min (ref >60)
GLUCOSE: 157 mg/dL — AB (ref 65–99)
GLUCOSE: 204 mg/dL — AB (ref 65–99)
Glucose, Bld: 126 mg/dL — ABNORMAL HIGH (ref 65–99)
Glucose, Bld: 161 mg/dL — ABNORMAL HIGH (ref 65–99)
POTASSIUM: 4 mmol/L (ref 3.5–5.1)
POTASSIUM: 4.2 mmol/L (ref 3.5–5.1)
POTASSIUM: 4.3 mmol/L (ref 3.5–5.1)
Potassium: 3.8 mmol/L (ref 3.5–5.1)
SODIUM: 135 mmol/L (ref 135–145)
SODIUM: 138 mmol/L (ref 135–145)
SODIUM: 138 mmol/L (ref 135–145)
Sodium: 135 mmol/L (ref 135–145)

## 2015-02-10 LAB — CBC
HCT: 38.7 % (ref 36.0–46.0)
HCT: 34.5 % — ABNORMAL LOW (ref 36.0–46.0)
Hemoglobin: 12.9 g/dL (ref 12.0–15.0)
Hemoglobin: 11.6 g/dL — ABNORMAL LOW (ref 12.0–15.0)
MCH: 31.8 pg (ref 26.0–34.0)
MCH: 31.8 pg (ref 26.0–34.0)
MCHC: 33.3 g/dL (ref 30.0–36.0)
MCHC: 33.6 g/dL (ref 30.0–36.0)
MCV: 94.5 fL (ref 78.0–100.0)
MCV: 95.3 fL (ref 78.0–100.0)
PLATELETS: 226 10*3/uL (ref 150–400)
Platelets: 264 10*3/uL (ref 150–400)
RBC: 4.06 MIL/uL (ref 3.87–5.11)
RBC: 3.65 MIL/uL — AB (ref 3.87–5.11)
RDW: 13.7 % (ref 11.5–15.5)
RDW: 13.8 % (ref 11.5–15.5)
WBC: 9.2 10*3/uL (ref 4.0–10.5)
WBC: 7.1 10*3/uL (ref 4.0–10.5)

## 2015-02-10 LAB — GLUCOSE, CAPILLARY
GLUCOSE-CAPILLARY: 157 mg/dL — AB (ref 65–99)
GLUCOSE-CAPILLARY: 99 mg/dL (ref 65–99)
GLUCOSE-CAPILLARY: 119 mg/dL — AB (ref 65–99)
GLUCOSE-CAPILLARY: 154 mg/dL — AB (ref 65–99)
Glucose-Capillary: 103 mg/dL — ABNORMAL HIGH (ref 65–99)
Glucose-Capillary: 115 mg/dL — ABNORMAL HIGH (ref 65–99)
Glucose-Capillary: 143 mg/dL — ABNORMAL HIGH (ref 65–99)
Glucose-Capillary: 147 mg/dL — ABNORMAL HIGH (ref 65–99)
Glucose-Capillary: 157 mg/dL — ABNORMAL HIGH (ref 65–99)
Glucose-Capillary: 167 mg/dL — ABNORMAL HIGH (ref 65–99)
Glucose-Capillary: 203 mg/dL — ABNORMAL HIGH (ref 65–99)
Glucose-Capillary: 204 mg/dL — ABNORMAL HIGH (ref 65–99)
Glucose-Capillary: 267 mg/dL — ABNORMAL HIGH (ref 65–99)
Glucose-Capillary: 97 mg/dL (ref 65–99)

## 2015-02-10 LAB — MRSA PCR SCREENING: MRSA by PCR: NEGATIVE

## 2015-02-10 MED ORDER — INSULIN ASPART PROT & ASPART (70-30 MIX) 100 UNIT/ML ~~LOC~~ SUSP
15.0000 [IU] | Freq: Two times a day (BID) | SUBCUTANEOUS | Status: DC
  Administered 2015-02-10 – 2015-02-11 (×3): 15 [IU] via SUBCUTANEOUS
  Filled 2015-02-10: qty 10

## 2015-02-10 MED ORDER — ZOLPIDEM TARTRATE 5 MG PO TABS
5.0000 mg | ORAL_TABLET | Freq: Once | ORAL | Status: AC
  Administered 2015-02-10: 5 mg via ORAL
  Filled 2015-02-10: qty 1

## 2015-02-10 MED ORDER — SODIUM CHLORIDE 0.9 % IV BOLUS (SEPSIS)
1000.0000 mL | Freq: Once | INTRAVENOUS | Status: AC
  Administered 2015-02-10: 1000 mL via INTRAVENOUS

## 2015-02-10 MED ORDER — INSULIN ASPART 100 UNIT/ML ~~LOC~~ SOLN
0.0000 [IU] | Freq: Three times a day (TID) | SUBCUTANEOUS | Status: DC
  Administered 2015-02-11 (×2): 2 [IU] via SUBCUTANEOUS

## 2015-02-10 MED ORDER — POTASSIUM CHLORIDE CRYS ER 20 MEQ PO TBCR
40.0000 meq | EXTENDED_RELEASE_TABLET | Freq: Once | ORAL | Status: AC
  Administered 2015-02-10: 40 meq via ORAL
  Filled 2015-02-10: qty 2

## 2015-02-10 MED ORDER — SODIUM CHLORIDE 0.45 % IV SOLN
INTRAVENOUS | Status: DC
  Administered 2015-02-10: 21:00:00 via INTRAVENOUS

## 2015-02-10 NOTE — Progress Notes (Signed)
Family Medicine Teaching Service Daily Progress Note Intern Pager: [phone]  Patient name: Kristine Allison Medical record number: [id number] Date of birth: 1980/10/02 Age: 34 y.o. Gender: female  Primary Care Provider: Janit PaganENIOLA, KEHINDE, MD Consultants: none Code Status: FULL  Pt Overview and Major Events to Date:  5/20 - admitted with DKA, gap 33, CBG >600, s/p 3 L NS in the ED, [facility] - gap closed overnight but not yet transitioned to subQ insulin as of 0800 (see below)  Assessment and Plan: Kristine Allison is a 34 y.o. female presenting with DKA . 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 K 5.6, Glucose 602, AG 33. Cr 1.64. UA >1000 glucose, >80 ketones, SG 1.023, small Leuks, yeast - overnight 5/21 gap closed but given poor control want to confirm closed gap --> BMP pending 5/21 AM - continue Glucostabilizer protocol until transition (subQ insulin 70/30, then stop drip after 1-2 hours) - space BMP to q8, replenish K as needed - continue NS@100  mL/h --> D5 1/2NS when CBG< 150 - last A1c ~12 (Feb 2016), repeat pending  #ARF syndrome - Cr 1.64 on admission - likely secondary to dehydration in setting of DKA - rehydrating and treating DKA as above - monitor serum Cr --> improved with rehydration  #Tobacco dependence - counseled briefly on cessation - Nicotine patch if pt requests  #Yeast infection: presumed. Yeast found in UA. No urinary symptoms. Likely vaginitis 2/2 hyperglycemia. -Diflucan 150mg  PO x1 given 5/21  FEN/GI: IVF as above Prophylaxis: heparin subQ  Disposition: management as above; likely discharge home after that; out of stepdown once gap closed, insulin transitioned, and CBG's  stable  Subjective:  Pt reports some improvement in headache, nausea. Appetite starting to come back. Denies pain, SOB, fever.  Objective: Temp:  [97.7 F (36.5 C)-99.2 F (37.3 C)] 98.1 F (36.7 C) (05/21 0757) Pulse Rate:  [89-128] 89 (05/21 0757) Resp:  [10-24] 18 (05/21 0757) BP: (86-122)/(40-65) 100/51 mmHg (05/21 0757) SpO2:  [90 %-100 %] 96 % (05/21 0757) Physical Exam: General: initially sleeping, wakes easily, in NAD HEENT: [place]/AT, EOMI, MM slightly dry but better than admission Cardio: RRR, no murmurs appreciated Pulm: normal WOB, breath sounds clear Abdomen: flat, soft, NT/ND, +BS Skin: no rashes, skin is dry and intact  Laboratory:  Recent Labs Lab 02/09/15 1703 02/09/15 2340 02/10/15 0653  WBC 9.0 9.2 7.1  HGB 15.4* 12.9 11.6*  HCT 48.1* 38.7 34.5*  PLT 287 264 226    Recent Labs Lab 02/09/15 1703 02/09/15 2340 02/10/15 0244  NA 135 138 138  K 5.6* 4.3 3.8  CL 93* 107 109  CO2 9* 17* 20*  BUN 43* 24* 20  CREATININE 1.64* 1.17* 0.88  CALCIUM 8.9 8.1* 8.3*  PROT 7.9  --   --   BILITOT 2.8*  --   --   ALKPHOS 94  --   --   ALT 18  --   --   AST 25  --   --   GLUCOSE 602* 204* 126*    Recent Labs Lab 02/09/15 2226 02/09/15 2329 02/10/15 0031 02/10/15 0133 02/10/15 0229 02/10/15 0333 02/10/15 0433 02/10/15 0536 02/10/15 0642 02/10/15 0755  GLUCAP 262* 215* 203* 157* 115* 99 97  119* 103* 143*   Hb A1c 5/20: PENDING  UA 5/20: >1000 glucose, >80 ketones, small leukocytes, yeast noted  Imaging/Diagnostic Tests: None to date  Bobbye Morton, MD 02/10/2015, 8:12 AM PGY-3, [facility] FPTS Intern pager: [phone], text pages welcome

## 2015-02-11 LAB — BASIC METABOLIC PANEL
Anion gap: 8 (ref 5–15)
BUN: 13 mg/dL (ref 6–20)
CALCIUM: 8.1 mg/dL — AB (ref 8.9–10.3)
CO2: 21 mmol/L — ABNORMAL LOW (ref 22–32)
Chloride: 101 mmol/L (ref 101–111)
Creatinine, Ser: 0.62 mg/dL (ref 0.44–1.00)
GLUCOSE: 230 mg/dL — AB (ref 65–99)
Potassium: 4.1 mmol/L (ref 3.5–5.1)
Sodium: 130 mmol/L — ABNORMAL LOW (ref 135–145)

## 2015-02-11 LAB — GLUCOSE, CAPILLARY
Glucose-Capillary: 187 mg/dL — ABNORMAL HIGH (ref 65–99)
Glucose-Capillary: 135 mg/dL — ABNORMAL HIGH (ref 65–99)

## 2015-02-11 LAB — CBC
HEMATOCRIT: 33 % — AB (ref 36.0–46.0)
HEMOGLOBIN: 11.3 g/dL — AB (ref 12.0–15.0)
MCH: 32.7 pg (ref 26.0–34.0)
MCHC: 34.2 g/dL (ref 30.0–36.0)
MCV: 95.4 fL (ref 78.0–100.0)
Platelets: 162 10*3/uL (ref 150–400)
RBC: 3.46 MIL/uL — ABNORMAL LOW (ref 3.87–5.11)
RDW: 13.7 % (ref 11.5–15.5)
WBC: 4.4 10*3/uL (ref 4.0–10.5)

## 2015-02-11 MED ORDER — ALBUTEROL SULFATE HFA 108 (90 BASE) MCG/ACT IN AERS: 2.0000 | INHALATION_SPRAY | Freq: Four times a day (QID) | RESPIRATORY_TRACT | Status: DC | PRN

## 2015-02-11 MED ORDER — INSULIN NPH ISOPHANE & REGULAR (70-30) 100 UNIT/ML ~~LOC~~ SUSP: SUBCUTANEOUS | Status: DC

## 2015-02-11 NOTE — Discharge Instructions (Signed)
We are supplying you with a card today to assist you with your medications. Please pick up your insulin immediately. We will keep your insulin 70/30,  20 units three times a day as you were taking it prior, since the reason you went into DKA was likely from you not taking your insulin for two days. Make certain to keep your glucose logs and bring them to your follow up appt.  Take your insulin as prescribed.  Call tomorrow morning to the clinic and make an appointment with your PCP to be seen within the next 1 week.   Diabetic Ketoacidosis Diabetic ketoacidosis (DKA) is a life-threatening complication of type 1 diabetes. It must be quickly recognized and treated. Treatment requires hospitalization. CAUSES  When there is no insulin in the body, glucose (sugar) cannot be used, and the body breaks down fat for energy. When fat breaks down, acids (ketones) build up in the blood. Very high levels of glucose and high levels of acids lead to severe loss of body fluids (dehydration) and other dangerous chemical changes. This stresses your vital organs and can cause coma or death. SIGNS AND SYMPTOMS   Tiredness (fatigue).  Weight loss.  Excessive thirst.  Ketones in your urine.  Light-headedness.  Fruity or sweet smelling breath.  Excessive urination.  Visual changes.  Confusion or irritability.  Nausea or vomiting.  Rapid breathing.  Stomachache or abdominal pain. DIAGNOSIS  Your health care provider will diagnose DKA based on your history, physical exam, and blood tests. The health care provider will check to see if you have another illness that caused you to go into DKA. Most of this will be done quickly in an emergency room. TREATMENT   Fluid replacement to correct dehydration.  Insulin.  Correction of electrolytes, such as potassium and sodium.  Antibiotic medicines. PREVENTION  Always take your insulin. Do not skip your insulin injections.  If you are sick, treat  yourself quickly. Your body often needs more insulin to fight the illness.  Check your blood glucose regularly.  Check urine ketones if your blood glucose is greater than 240 milligrams per deciliter (mg/dL).  Do not use outdated (expired) insulin.  If your blood glucose is high, drink plenty of fluids. This helps flush out ketones. HOME CARE INSTRUCTIONS   If you are sick, follow the advice of your health care provider.  To prevent dehydration, drink enough water and fluids to keep your urine clear or pale yellow.  If you cannot eat, alternate between drinking fluids with sugar (soda, juices, flavored gelatin) and salty fluids (broth, bouillon).  If you can eat, follow your usual diet and drink sugar-free liquids (water, diet drinks).  Always take your usual dose of insulin. If you cannot eat or if your glucose is getting too low, call your health care provider for further instructions.  Continue to monitor your blood or urine ketones every 3-4 hours around the clock. Set your alarm clock or have someone wake you up. If you are too sick, have someone test it for you.  Rest and avoid exercise. SEEK MEDICAL CARE IF:   You have a fever.  You have ketones in your urine, or your blood glucose is higher than a level your health care provider suggests. You may need extra insulin. Call your health care provider if you need advice on adjusting your insulin.  You cannot drink at least a tablespoon (15 mL) of fluid every 15-20 minutes.  You have been vomiting for more than 2  hours.  You have symptoms of DKA:  Fruity smelling breath.  Breathing faster or slower.  Becoming very sleepy. SEEK IMMEDIATE MEDICAL CARE IF:   You have signs of dehydration:  Decreased urination.  Increased thirst.  Dry skin and mouth.  Light-headedness.  Your blood glucose is very high (as advised by your health care provider) twice in a row.  You faint.  You have chest pain or trouble  breathing.  You have a sudden, severe headache.  You have sudden weakness in one arm or one leg.  You have sudden trouble speaking or swallowing.  You have vomiting or diarrhea that is getting worse after 3 hours.  You have abdominal pain. MAKE SURE YOU:   Understand these instructions.  Will watch your condition.  Will get help right away if you are not doing well or get worse. Document Released: 09/05/2000 Document Revised: 09/13/2013 Document Reviewed: 03/14/2009 [facility] Patient Information 2015 [employer] [place]. This information is not intended to replace advice given to you by your health care provider. Make sure you discuss any questions you have with your health care provider.

## 2015-02-11 NOTE — Care Management Note (Signed)
Case Management Note  Patient Details  Name: PATRICA MENDELL MRN: [id number] Date of Birth: 19-Apr-1981  Subjective/Objective:                   DKA  Action/Plan:  Discharge planning Expected Discharge Date:  02/11/15              Expected Discharge Plan:  Home/Self Care  In-House Referral:     Discharge planning Services  CM Consult, [place] [facility], Medication Assistance  Post Acute Care Choice:    Choice offered to:     DME Arranged:    DME Agency:     HH Arranged:    [place] Agency:     Status of Service:  Completed, signed off  Medicare Important Message Given:    Date Medicare IM Given:    Medicare IM give by:    Date Additional Medicare IM Given:    Additional Medicare Important Message give by:     If discussed at [place] of Stay Meetings, dates discussed:    Additional Comments: CM met with pt in room and gave [facility] letter with list of participating pharmacies.  Pt verbalized understanding of all MATCH parameters.  Pt is with [place] and is in process of getting insurance.  No other CM needs were communicated. Dellie Catholic, RN 02/11/2015, 10:09 AM

## 2015-02-11 NOTE — Discharge Summary (Signed)
Family Medicine Teaching [facility] Discharge Summary  Patient name: Kristine Allison Medical record number: [id number] Date of birth: [birth year] Age: 34 y.o. Gender: female Date of Admission: 02/09/2015  Date of Discharge: 02/11/2015 Admitting Physician: Doreene Eland, MD  Primary Care Provider: Janit Pagan, MD Consultants: None   Indication for Hospitalization: DKA  Discharge Diagnoses/Problem List:  Patient Active Problem List   Diagnosis Date Noted  . DKA, type 1 02/09/2015  . Yeast infection 02/09/2015  . Acute renal failure syndrome   . Diabetic ketoacidosis with coma associated with type 1 diabetes mellitus   . Metabolic acidosis, increased anion gap   . Diabetic ketoacidosis 12/21/2014  . Tachycardia 11/03/2014  . Insomnia 09/08/2014  . Dizziness 09/08/2014  . DKA, type 2 08/20/2014  . DM (diabetes mellitus) type 1, uncontrolled, with ketoacidosis 08/20/2014  . DKA, type 1, not at goal 08/20/2014  . Migraine, unspecified 12/14/2013  . Elevated troponin I level 11/19/2013  . Acute myocardial infarction, subendocardial infarction, initial episode of care 11/18/2013  . Noncompliance with medication regimen 11/15/2013  . Leg pain 09/08/2013  . Loss of weight 09/08/2013  . Depression 09/08/2013  . Hypotension, unspecified 09/08/2013  . Tobacco use disorder 08/01/2013  . Diabetes mellitus 06/24/2012  . GERD (gastroesophageal reflux disease) 06/17/2012   Disposition: Home  Discharge Condition: Stable  Discharge Exam:  BP 115/58 mmHg  Pulse 63  Temp(Src) 97.6 F (36.4 C) (Oral)  Resp 18  Ht  (1.651 m)  Wt 127 lb (57.607 kg)  BMI 21.13 kg/m2  SpO2 99% General: alert, NAD, lying in bed, thinning hair and pale appearing, does not make good eye contact Eyes: EOMI ENTM: MMM Cardiovascular: RRR, no murmurs appreciated Respiratory:  no increased WOB, CTAB no wheeze or rales.  Abdomen: flat, soft, NT/ND, +BS MSK: moves extremities  independently, no edema, +2DP Skin: no rashes, skin is dry and intact Neuro: no focal deficits, follows commands Psych: speech normal, mood flat  Brief Hospital Course:  Kristine Allison is a 34 y.o. female presenting with DKA . PMH is significant for (unclear if )Type 1 or Type 2 DM but insulin dependent w/ prior hospitalization for DKA, tobacco use disorder, ARF syndrome.  She woke up with a headache, nausea and vomiting after not taking her insulin for 2 days, secondary to fatigue and her work schedule.  She reported that she had not been eating well over this time either.  She reports compliance (with the exception of those 2 days) with 70/30 insulin 20 units TID,  and takes Metformin "2 tablets each morning" intermittently, but does not like the GI side effects from the metformin so she does not take metformin daily.  She reported difficulty obtaining her medications and case management was consulted and was able to give her assistance. Her Novolin and albuterol inhaler were refilled on discharge. Patient was stable at time of discharge and had prescriptions in hand, along with assistance information.   #DKA, in the setting of poorly controlled DM: CBG 536 with EMS. CBG 576 in ED at [facility]. Metabolic acidosis on VBG with pH 7.127, pCO2 33.4, Bicarb 10.5. K 5.6, Glucose 602, AG 33. Cr 1.64. UA >1000 glucose, >80 ketones, SG 1.023, small Leuks, yeast. Patient was admitted to [facility] on IVDF and  gluco stabilizer until gap closed. Patient was then transitioned into her home medications and continued to do well.   #ARF syndrome:  likely secondary to dehydration in setting of DKA. Creatinine on admission 1.64 >>  IVF given >>  0.62 on discharge.  #Tobacco dependence: counseled briefly on cessation, nicotine patches offered.   #Yeast infection: Yeast found in UA. No urinary symptoms. Likely vaginitis 2/2 hyperglycemia. Diflucan 150mg  PO x1 during admission.    Issues for Follow Up:  1. C-peptide  results >> ? Need for metformin 2. Glucose logs 3. Hyponatremia  Significant Procedures: None  Significant Labs and Imaging:   Recent Labs Lab 02/09/15 2340 02/10/15 0653 02/11/15 0315  WBC 9.2 7.1 4.4  HGB 12.9 11.6* 11.3*  HCT 38.7 34.5* 33.0*  PLT 264 226 162    Recent Labs Lab 02/09/15 1703 02/09/15 2340 02/10/15 0244 02/10/15 0858 02/10/15 1624 02/11/15 0315  NA 135 138 138 135 135 130*  K 5.6* 4.3 3.8 4.0 4.2 4.1  CL 93* 107 109 106 106 101  CO2 9* 17* 20* 21* 20* 21*  GLUCOSE 602* 204* 126* 157* 161* 230*  BUN 43* 24* 20 16 13 13   CREATININE 1.64* 1.17* 0.88 0.78 0.79 0.62  CALCIUM 8.9 8.1* 8.3* 7.7* 8.4* 8.1*  ALKPHOS 94  --   --   --   --   --   AST 25  --   --   --   --   --   ALT 18  --   --   --   --   --   ALBUMIN 4.6  --   --   --   --   --      Results/Tests Pending at Time of Discharge: C-peptide  Discharge Medications:    Medication List    TAKE these medications        albuterol 108 (90 BASE) MCG/ACT inhaler  Commonly known as:  PROAIR HFA  Inhale 2 puffs into the lungs every 4 (four) hours as needed for wheezing or shortness of breath.     albuterol 108 (90 BASE) MCG/ACT inhaler  Commonly known as:  PROVENTIL HFA;VENTOLIN HFA  Inhale 2 puffs into the lungs every 6 (six) hours as needed for wheezing or shortness of breath.     aspirin 81 MG EC tablet  Take 1 tablet (81 mg total) by mouth daily.     insulin NPH-regular Human (70-30) 100 UNIT/ML injection  Commonly known as:  NOVOLIN 70/30  Inject 20 unit in am,20 unit at noon and 20 unit at night with each meal. Hold if CBG is less than 70     onetouch ultrasoft lancets  Use as instructed     traZODone 50 MG tablet  Commonly known as:  DESYREL  Take 1 tablet (50 mg total) by mouth 2 (two) times daily.        Discharge Instructions: Please refer to Patient Instructions section of EMR for full details.  Patient was counseled important signs and symptoms that should prompt  return to medical care, changes in medications, dietary instructions, activity restrictions, and follow up appointments.   Follow-Up Appointments: Follow-up Information    Follow up with Janit PaganENIOLA, KEHINDE, MD. Call in 1 day.   Specialty:  Family Medicine   Why:  Call tomorrow to make an appointment for this week.    Contact information:   [address] [place] [address] [phone]       Natalia LeatherwoodRenee A Kuneff, DO 02/11/2015, 11:26 AM PGY-3, [facility]

## 2015-02-12 LAB — HEMOGLOBIN A1C
Hgb A1c MFr Bld: 12.6 % — ABNORMAL HIGH (ref 4.8–5.6)
Mean Plasma Glucose: 315 mg/dL

## 2015-02-12 LAB — C-PEPTIDE: C-Peptide: <0.1 ng/mL — ABNORMAL LOW (ref 1.1–4.4)

## 2015-02-13 ENCOUNTER — Telehealth: Payer: Self-pay | Admitting: Family Medicine

## 2015-02-13 NOTE — Telephone Encounter (Signed)
I discussed A1C result and C-peptide with patient. She is not well controlled on her current home regimen. I also advised D/C Metformin since she is type 1 DM. I recommended follow up with me in 1-2 wks so we can adjust her insulin. She agreed with plan. In the mean time, she is advised to continue home CBG check and log so we can review her numbers during next visit.

## 2015-02-20 ENCOUNTER — Ambulatory Visit: Payer: Self-pay | Admitting: Family Medicine

## 2015-05-03 ENCOUNTER — Inpatient Hospital Stay (HOSPITAL_COMMUNITY)
Admission: EM | Admit: 2015-05-03 | Discharge: 2015-05-05 | DRG: 638 | Disposition: A | Discharge status: Home / Self Care | Payer: Self-pay | Attending: Internal Medicine | Admitting: Internal Medicine

## 2015-05-03 ENCOUNTER — Emergency Department (HOSPITAL_COMMUNITY): Payer: Self-pay

## 2015-05-03 ENCOUNTER — Encounter (HOSPITAL_COMMUNITY): Payer: Self-pay | Admitting: Emergency Medicine

## 2015-05-03 DIAGNOSIS — Z7982 Long term (current) use of aspirin: Secondary | ICD-10-CM

## 2015-05-03 DIAGNOSIS — IMO0002 Reserved for concepts with insufficient information to code with codable children: Secondary | ICD-10-CM | POA: Insufficient documentation

## 2015-05-03 DIAGNOSIS — G8929 Other chronic pain: Secondary | ICD-10-CM | POA: Diagnosis present

## 2015-05-03 DIAGNOSIS — K219 Gastro-esophageal reflux disease without esophagitis: Secondary | ICD-10-CM | POA: Diagnosis present

## 2015-05-03 DIAGNOSIS — M25561 Pain in right knee: Secondary | ICD-10-CM | POA: Diagnosis present

## 2015-05-03 DIAGNOSIS — F17200 Nicotine dependence, unspecified, uncomplicated: Secondary | ICD-10-CM | POA: Diagnosis present

## 2015-05-03 DIAGNOSIS — W19XXXA Unspecified fall, initial encounter: Secondary | ICD-10-CM

## 2015-05-03 DIAGNOSIS — F419 Anxiety disorder, unspecified: Secondary | ICD-10-CM | POA: Diagnosis present

## 2015-05-03 DIAGNOSIS — F17210 Nicotine dependence, cigarettes, uncomplicated: Secondary | ICD-10-CM

## 2015-05-03 DIAGNOSIS — E872 Acidosis, unspecified: Secondary | ICD-10-CM

## 2015-05-03 DIAGNOSIS — F32A Depression, unspecified: Secondary | ICD-10-CM | POA: Diagnosis present

## 2015-05-03 DIAGNOSIS — Z9114 Patient's other noncompliance with medication regimen: Secondary | ICD-10-CM | POA: Diagnosis present

## 2015-05-03 DIAGNOSIS — Z91040 Latex allergy status: Secondary | ICD-10-CM

## 2015-05-03 DIAGNOSIS — N179 Acute kidney failure, unspecified: Secondary | ICD-10-CM | POA: Diagnosis present

## 2015-05-03 DIAGNOSIS — F329 Major depressive disorder, single episode, unspecified: Secondary | ICD-10-CM

## 2015-05-03 DIAGNOSIS — R51 Headache: Secondary | ICD-10-CM | POA: Diagnosis present

## 2015-05-03 DIAGNOSIS — M25562 Pain in left knee: Secondary | ICD-10-CM | POA: Diagnosis present

## 2015-05-03 DIAGNOSIS — E1010 Type 1 diabetes mellitus with ketoacidosis without coma: Principal | ICD-10-CM | POA: Diagnosis present

## 2015-05-03 DIAGNOSIS — I959 Hypotension, unspecified: Secondary | ICD-10-CM | POA: Diagnosis not present

## 2015-05-03 DIAGNOSIS — W1830XA Fall on same level, unspecified, initial encounter: Secondary | ICD-10-CM | POA: Diagnosis present

## 2015-05-03 DIAGNOSIS — M549 Dorsalgia, unspecified: Secondary | ICD-10-CM | POA: Diagnosis present

## 2015-05-03 DIAGNOSIS — R0789 Other chest pain: Secondary | ICD-10-CM | POA: Diagnosis present

## 2015-05-03 DIAGNOSIS — R079 Chest pain, unspecified: Secondary | ICD-10-CM

## 2015-05-03 DIAGNOSIS — S8001XA Contusion of right knee, initial encounter: Secondary | ICD-10-CM

## 2015-05-03 DIAGNOSIS — Z794 Long term (current) use of insulin: Secondary | ICD-10-CM

## 2015-05-03 DIAGNOSIS — E860 Dehydration: Secondary | ICD-10-CM | POA: Diagnosis present

## 2015-05-03 DIAGNOSIS — J45909 Unspecified asthma, uncomplicated: Secondary | ICD-10-CM | POA: Diagnosis present

## 2015-05-03 DIAGNOSIS — S8002XA Contusion of left knee, initial encounter: Secondary | ICD-10-CM

## 2015-05-03 DIAGNOSIS — E1065 Type 1 diabetes mellitus with hyperglycemia: Secondary | ICD-10-CM | POA: Insufficient documentation

## 2015-05-03 DIAGNOSIS — Z79899 Other long term (current) drug therapy: Secondary | ICD-10-CM

## 2015-05-03 HISTORY — DX: Anxiety disorder, unspecified: F41.9

## 2015-05-03 LAB — I-STAT VENOUS BLOOD GAS, ED
Acid-base deficit: 17 mmol/L — ABNORMAL HIGH (ref 0.0–2.0)
Bicarbonate: 10.9 mEq/L — ABNORMAL LOW (ref 20.0–24.0)
O2 SAT: 47 %
PCO2 VEN: 31.4 mmHg — AB (ref 45.0–50.0)
PO2 VEN: 32 mmHg (ref 30.0–45.0)
TCO2: 12 mmol/L (ref 0–100)
pH, Ven: 7.149 — CL (ref 7.250–7.300)

## 2015-05-03 LAB — URINE MICROSCOPIC-ADD ON

## 2015-05-03 LAB — BASIC METABOLIC PANEL
ANION GAP: 10 (ref 5–15)
Anion gap: 25 — ABNORMAL HIGH (ref 5–15)
Anion gap: 14 (ref 5–15)
Anion gap: 9 (ref 5–15)
BUN: 16 mg/dL (ref 6–20)
BUN: 11 mg/dL (ref 6–20)
BUN: 10 mg/dL (ref 6–20)
BUN: 8 mg/dL (ref 6–20)
CHLORIDE: 109 mmol/L (ref 101–111)
CHLORIDE: 109 mmol/L (ref 101–111)
CO2: 10 mmol/L — ABNORMAL LOW (ref 22–32)
CO2: 12 mmol/L — ABNORMAL LOW (ref 22–32)
CO2: 16 mmol/L — AB (ref 22–32)
CO2: 16 mmol/L — ABNORMAL LOW (ref 22–32)
CREATININE: 1.49 mg/dL — AB (ref 0.44–1.00)
Calcium: 9.1 mg/dL (ref 8.9–10.3)
Calcium: 7.5 mg/dL — ABNORMAL LOW (ref 8.9–10.3)
Calcium: 7.5 mg/dL — ABNORMAL LOW (ref 8.9–10.3)
Calcium: 7.6 mg/dL — ABNORMAL LOW (ref 8.9–10.3)
Chloride: 96 mmol/L — ABNORMAL LOW (ref 101–111)
Chloride: 111 mmol/L (ref 101–111)
Creatinine, Ser: 1.16 mg/dL — ABNORMAL HIGH (ref 0.44–1.00)
Creatinine, Ser: 0.98 mg/dL (ref 0.44–1.00)
Creatinine, Ser: 0.72 mg/dL (ref 0.44–1.00)
GFR calc Af Amer: >60 mL/min (ref >60)
GFR calc non Af Amer: 46 mL/min — ABNORMAL LOW (ref >60)
GFR calc non Af Amer: >60 mL/min (ref >60)
GFR calc non Af Amer: >60 mL/min (ref >60)
GFR calc non Af Amer: >60 mL/min (ref >60)
GFR, EST AFRICAN AMERICAN: 53 mL/min — AB (ref >60)
GLUCOSE: 231 mg/dL — AB (ref 65–99)
Glucose, Bld: 294 mg/dL — ABNORMAL HIGH (ref 65–99)
Glucose, Bld: 206 mg/dL — ABNORMAL HIGH (ref 65–99)
Glucose, Bld: 179 mg/dL — ABNORMAL HIGH (ref 65–99)
POTASSIUM: 3.4 mmol/L — AB (ref 3.5–5.1)
Potassium: 4.4 mmol/L (ref 3.5–5.1)
Potassium: 3.8 mmol/L (ref 3.5–5.1)
Potassium: 3.5 mmol/L (ref 3.5–5.1)
SODIUM: 131 mmol/L — AB (ref 135–145)
SODIUM: 135 mmol/L (ref 135–145)
Sodium: 135 mmol/L (ref 135–145)
Sodium: 136 mmol/L (ref 135–145)

## 2015-05-03 LAB — GLUCOSE, CAPILLARY
GLUCOSE-CAPILLARY: 140 mg/dL — AB (ref 65–99)
Glucose-Capillary: 122 mg/dL — ABNORMAL HIGH (ref 65–99)
Glucose-Capillary: 159 mg/dL — ABNORMAL HIGH (ref 65–99)
Glucose-Capillary: 168 mg/dL — ABNORMAL HIGH (ref 65–99)
Glucose-Capillary: 178 mg/dL — ABNORMAL HIGH (ref 65–99)
Glucose-Capillary: 165 mg/dL — ABNORMAL HIGH (ref 65–99)
Glucose-Capillary: 188 mg/dL — ABNORMAL HIGH (ref 65–99)

## 2015-05-03 LAB — CBG MONITORING, ED
GLUCOSE-CAPILLARY: 246 mg/dL — AB (ref 65–99)
Glucose-Capillary: 133 mg/dL — ABNORMAL HIGH (ref 65–99)
Glucose-Capillary: 174 mg/dL — ABNORMAL HIGH (ref 65–99)
Glucose-Capillary: 337 mg/dL — ABNORMAL HIGH (ref 65–99)
Glucose-Capillary: 293 mg/dL — ABNORMAL HIGH (ref 65–99)
Glucose-Capillary: 245 mg/dL — ABNORMAL HIGH (ref 65–99)
Glucose-Capillary: 134 mg/dL — ABNORMAL HIGH (ref 65–99)

## 2015-05-03 LAB — CBC WITH DIFFERENTIAL/PLATELET
Basophils Absolute: 0 10*3/uL (ref 0.0–0.1)
Basophils Relative: 0 % (ref 0–1)
EOS ABS: 0 10*3/uL (ref 0.0–0.7)
Eosinophils Relative: 0 % (ref 0–5)
HCT: 47.4 % — ABNORMAL HIGH (ref 36.0–46.0)
HEMOGLOBIN: 16.2 g/dL — AB (ref 12.0–15.0)
LYMPHS ABS: 2.4 10*3/uL (ref 0.7–4.0)
Lymphocytes Relative: 22 % (ref 12–46)
MCH: 32.5 pg (ref 26.0–34.0)
MCHC: 34.2 g/dL (ref 30.0–36.0)
MCV: 95 fL (ref 78.0–100.0)
MONOS PCT: 7 % (ref 3–12)
Monocytes Absolute: 0.8 10*3/uL (ref 0.1–1.0)
Neutro Abs: 7.9 10*3/uL — ABNORMAL HIGH (ref 1.7–7.7)
Neutrophils Relative %: 71 % (ref 43–77)
Platelets: 249 10*3/uL (ref 150–400)
RBC: 4.99 MIL/uL (ref 3.87–5.11)
RDW: 13.7 % (ref 11.5–15.5)
WBC: 11.1 10*3/uL — ABNORMAL HIGH (ref 4.0–10.5)

## 2015-05-03 LAB — SALICYLATE LEVEL: SALICYLATE LVL: 11.9 mg/dL (ref 2.8–30.0)

## 2015-05-03 LAB — URINALYSIS, ROUTINE W REFLEX MICROSCOPIC
Bilirubin Urine: NEGATIVE
Hgb urine dipstick: NEGATIVE
Ketones, ur: >80 mg/dL — AB
LEUKOCYTES UA: NEGATIVE
Nitrite: NEGATIVE
Protein, ur: NEGATIVE mg/dL
Specific Gravity, Urine: 1.025 (ref 1.005–1.030)
Urobilinogen, UA: 0.2 mg/dL (ref 0.0–1.0)
pH: 5 (ref 5.0–8.0)

## 2015-05-03 LAB — D-DIMER, QUANTITATIVE (NOT AT ARMC)

## 2015-05-03 LAB — TROPONIN I: Troponin I: <0.03 ng/mL (ref <0.031)

## 2015-05-03 LAB — ACETAMINOPHEN LEVEL: Acetaminophen (Tylenol), Serum: <10 ug/mL — ABNORMAL LOW (ref 10–30)

## 2015-05-03 LAB — I-STAT TROPONIN, ED: TROPONIN I, POC: 0 ng/mL (ref 0.00–0.08)

## 2015-05-03 LAB — I-STAT CG4 LACTIC ACID, ED: Lactic Acid, Venous: 1.6 mmol/L (ref 0.5–2.0)

## 2015-05-03 LAB — POC URINE PREG, ED: Preg Test, Ur: NEGATIVE

## 2015-05-03 MED ORDER — ALBUTEROL SULFATE (2.5 MG/3ML) 0.083% IN NEBU: 3.0000 mL | INHALATION_SOLUTION | Freq: Four times a day (QID) | RESPIRATORY_TRACT | Status: DC | PRN

## 2015-05-03 MED ORDER — OXYCODONE-ACETAMINOPHEN 5-325 MG PO TABS
1.0000 | ORAL_TABLET | ORAL | Status: DC | PRN
  Administered 2015-05-03: 1 via ORAL
  Administered 2015-05-03 – 2015-05-05 (×6): 2 via ORAL
  Filled 2015-05-03: qty 1
  Filled 2015-05-03 (×6): qty 2

## 2015-05-03 MED ORDER — ASPIRIN EC 81 MG PO TBEC
81.0000 mg | DELAYED_RELEASE_TABLET | Freq: Every day | ORAL | Status: DC
  Administered 2015-05-03: 81 mg via ORAL

## 2015-05-03 MED ORDER — ALBUTEROL SULFATE HFA 108 (90 BASE) MCG/ACT IN AERS: 2.0000 | INHALATION_SPRAY | Freq: Four times a day (QID) | RESPIRATORY_TRACT | Status: DC | PRN

## 2015-05-03 MED ORDER — SODIUM CHLORIDE 0.9 % IV BOLUS (SEPSIS): 500.0000 mL | Freq: Once | INTRAVENOUS | Status: DC

## 2015-05-03 MED ORDER — POTASSIUM CHLORIDE IN NACL 20-0.9 MEQ/L-% IV SOLN
INTRAVENOUS | Status: DC
  Administered 2015-05-03: 15:00:00 via INTRAVENOUS
  Filled 2015-05-03 (×4): qty 1000

## 2015-05-03 MED ORDER — DEXTROSE-NACL 5-0.9 % IV SOLN
INTRAVENOUS | Status: DC
  Administered 2015-05-03: 150 mL/h via INTRAVENOUS
  Administered 2015-05-03 (×2): via INTRAVENOUS

## 2015-05-03 MED ORDER — POTASSIUM CHLORIDE CRYS ER 20 MEQ PO TBCR
40.0000 meq | EXTENDED_RELEASE_TABLET | Freq: Once | ORAL | Status: AC
Start: 2015-05-03 — End: 2015-05-03
  Administered 2015-05-03: 40 meq via ORAL
  Filled 2015-05-03: qty 2

## 2015-05-03 MED ORDER — ENOXAPARIN SODIUM 40 MG/0.4ML ~~LOC~~ SOLN
40.0000 mg | SUBCUTANEOUS | Status: DC
  Administered 2015-05-03 – 2015-05-04 (×2): 40 mg via SUBCUTANEOUS
  Filled 2015-05-03 (×3): qty 0.4

## 2015-05-03 MED ORDER — SODIUM CHLORIDE 0.9 % IV SOLN
INTRAVENOUS | Status: DC
  Administered 2015-05-03: 1.2 [IU]/h via INTRAVENOUS
  Administered 2015-05-03: 1.1 [IU]/h via INTRAVENOUS
  Administered 2015-05-03: 1.9 [IU]/h via INTRAVENOUS
  Administered 2015-05-03: 2.6 [IU]/h via INTRAVENOUS
  Filled 2015-05-03: qty 2.5

## 2015-05-03 MED ORDER — SODIUM CHLORIDE 0.9 % IJ SOLN
3.0000 mL | Freq: Two times a day (BID) | INTRAMUSCULAR | Status: DC
  Administered 2015-05-04: 3 mL via INTRAVENOUS

## 2015-05-03 MED ORDER — ASPIRIN EC 81 MG PO TBEC
81.0000 mg | DELAYED_RELEASE_TABLET | Freq: Every day | ORAL | Status: DC
  Administered 2015-05-04 – 2015-05-05 (×2): 81 mg via ORAL
  Filled 2015-05-03 (×4): qty 1

## 2015-05-03 MED ORDER — INSULIN ASPART 100 UNIT/ML ~~LOC~~ SOLN
10.0000 [IU] | Freq: Once | SUBCUTANEOUS | Status: DC
  Filled 2015-05-03: qty 1

## 2015-05-03 MED ORDER — INSULIN GLARGINE 100 UNIT/ML ~~LOC~~ SOLN
15.0000 [IU] | Freq: Every day | SUBCUTANEOUS | Status: DC
  Administered 2015-05-03: 15 [IU] via SUBCUTANEOUS
  Filled 2015-05-03 (×2): qty 0.15

## 2015-05-03 MED ORDER — ACETAMINOPHEN 500 MG PO TABS
1000.0000 mg | ORAL_TABLET | Freq: Once | ORAL | Status: AC
  Administered 2015-05-03: 1000 mg via ORAL
  Filled 2015-05-03: qty 2

## 2015-05-03 MED ORDER — SODIUM CHLORIDE 0.9 % IV BOLUS (SEPSIS): 1000.0000 mL | Freq: Once | INTRAVENOUS | Status: DC

## 2015-05-03 MED ORDER — INSULIN ASPART 100 UNIT/ML ~~LOC~~ SOLN
5.0000 [IU] | Freq: Once | SUBCUTANEOUS | Status: AC
  Administered 2015-05-03: 5 [IU] via INTRAVENOUS

## 2015-05-03 MED ORDER — INSULIN ASPART 100 UNIT/ML ~~LOC~~ SOLN: 8.0000 [IU] | Freq: Once | SUBCUTANEOUS | Status: DC

## 2015-05-03 MED ORDER — SODIUM CHLORIDE 0.9 % IV SOLN: INTRAVENOUS | Status: DC

## 2015-05-03 MED ORDER — SODIUM CHLORIDE 0.9 % IV BOLUS (SEPSIS)
2000.0000 mL | Freq: Once | INTRAVENOUS | Status: AC
Start: 2015-05-03 — End: 2015-05-03
  Administered 2015-05-03: 2000 mL via INTRAVENOUS

## 2015-05-03 MED ORDER — TRAZODONE HCL 50 MG PO TABS
50.0000 mg | ORAL_TABLET | Freq: Every day | ORAL | Status: DC
Start: 2015-05-03 — End: 2015-05-04
  Administered 2015-05-03: 50 mg via ORAL
  Filled 2015-05-03: qty 1

## 2015-05-03 MED ORDER — INSULIN ASPART 100 UNIT/ML ~~LOC~~ SOLN: 10.0000 [IU] | Freq: Once | SUBCUTANEOUS | Status: DC

## 2015-05-03 NOTE — Progress Notes (Signed)
On call MD paged regarding patient has had 4 consecutive CBG's between 140 and 180. Question of he wants insulin drip to be changed over to SQ at this point. [facility] [employer]

## 2015-05-03 NOTE — ED Notes (Signed)
Patient states she fell at work having a near syncopal episode at work yesterday after the chest pain started.  Patient has bilateral bruising to the knees and bruising to the left hand from falling.  Patient states "I was able to get up after landing on my knees", denies hitting her head.    Venous BG results given to [facility] PA.

## 2015-05-03 NOTE — H&P (Signed)
Date: 05/03/2015               Patient Name:  Kristine Allison MRN: [id number]  DOB: 10-May-1981 Age / Sex: 34 y.o., female   PCP: Doreene Eland, MD         Medical Service: Internal Medicine Teaching Service         Attending Physician: Dr. Doneen Poisson, MD    First Contact: Dr. Selina Cooley Pager: [phone]  Second Contact: Dr. Andrey Campanile Pager: [phone]       After Hours (After 5p/  First Contact Pager: [phone]  weekends / holidays): Second Contact Pager: [phone]   Chief Complaint: Chest pain  History of Present Illness: 65 Y O F  With PMH of DM- ?type 1, with medication non compliance hx, depression, tobacco abuse, GERD. Presented today with complaints of chest pain that started yesterday at about 2pm, when she was sleeping, pain woke her up from sleep. She describes the pain as shooting and sharp, located on the left side of her chest. Rates pain as a 20/10. Pain lasted all day. Pain is aggravated by deep breathing. There was no associated SOB with Chest pain, but no diaphoresis, nausea or vomiting. She says pain went down her left arm causing tingling and made her chronic back pain feel worse. She endorses having similar chest pain about 8 months ago when she was admitted here, but per chart that was February 2015. She smokes- about 6-7 cigs per day, for the past 16 yrs.  She denies recent travel, hx of blood clots, denies use frequent use of NSAIDs, takes goody powders occasionally, denies dark stools.    She says she has had a poor appetite over the past 2 days, last meal was 2 days ago, she says just has not had an appetite, She denies vomiting, diarrhea or abdominal pain. She has been taking her 70/30- 20u twice a day, despite not eating- ( She was prescribed this med for 3 times a day), and took her last dose this morning before coming to the ED. She endorses been dizzy at baseline, and has to stand for a while after getting up from sitting position, so dizziness passes  before she can move. She says she fell yesterday, bruising her knees, was out for about a second, and came to once she was on the floor. She has been able to bear weight on her legs without increased pain. She had no preceeding dizziness, lightheadedness before she fell, which was 2 hrs after her chest pain started. She denies polyuria, dysuria, cough or SOB, no fever or chills.   In the Ed she was found to have an Anion gap - 25, Bicarb of 10, venous blood gas- PH- 7.149, CBG- 294. IMTS was called to admit.   Meds: Current Facility-Administered Medications  Medication Dose Route Frequency Provider Last Rate Last Dose  . dextrose 5 %-0.9 % sodium chloride infusion   Intravenous Continuous Ejiroghene Wendall Stade, MD 150 mL/hr at 05/03/15 1210    . insulin regular (NOVOLIN R,HUMULIN R) 250 Units in sodium chloride 0.9 % 250 mL (1 Units/mL) infusion   Intravenous Continuous Antony Madura, PA-C 2.8 mL/hr at 05/03/15 1304 2.8 Units/hr at 05/03/15 1304   Current Outpatient Prescriptions  Medication Sig Dispense Refill  . aspirin EC 81 MG EC tablet Take 1 tablet (81 mg total) by mouth daily. 30 tablet 11  . Aspirin-Acetaminophen-Caffeine (GOODY HEADACHE PO) Take 1 each by mouth as needed (headache).    Marland Kitchen  insulin NPH-regular Human (NOVOLIN 70/30) (70-30) 100 UNIT/ML injection Inject 20 unit in am,20 unit at noon and 20 unit at night with each meal. Hold if CBG is less than 70 (Patient taking differently: Inject 20 unit in am,15 unit at noon and 20 unit at night with each meal. Hold if CBG is less than 70) 10 mL 4  . albuterol (PROAIR HFA) 108 (90 BASE) MCG/ACT inhaler Inhale 2 puffs into the lungs every 4 (four) hours as needed for wheezing or shortness of breath. (Patient not taking: Reported on 05/03/2015) 3.7 g 0  . albuterol (PROVENTIL HFA;VENTOLIN HFA) 108 (90 BASE) MCG/ACT inhaler Inhale 2 puffs into the lungs every 6 (six) hours as needed for wheezing or shortness of breath. 1 Inhaler 2  . Lancets  (ONETOUCH ULTRASOFT) lancets Use as instructed 100 each 12  . traZODone (DESYREL) 50 MG tablet Take 1 tablet (50 mg total) by mouth 2 (two) times daily. (Patient not taking: Reported on 02/09/2015) 60 tablet 4    Allergies: Allergies as of 05/03/2015 - Review Complete 05/03/2015  Allergen Reaction Noted  . Latex Rash 06/05/2013   Past Medical History  Diagnosis Date  . Asthma   . Diabetes mellitus without complication   . Headache(784.0)   . Anxiety    Past Surgical History  Procedure Laterality Date  . No past surgeries     Family History  Problem Relation Age of Onset  . Heart disease      No family history   Social History   Social History  . Marital Status: Divorced    Spouse Name: N/A  . Number of Children: N/A  . Years of Education: N/A   Occupational History  . Not on file.   Social History Main Topics  . Smoking status: Current Every Day Smoker -- 0.25 packs/day for 20 years    Types: Cigarettes  . Smokeless tobacco: Never Used  . Alcohol Use: No  . Drug Use: No  . Sexual Activity: Not Currently   Other Topics Concern  . Not on file   Social History Narrative    Review of Systems: CONSTITUTIONAL- No Fever, weightloss, night sweat, has reduced appetite. SKIN- No Rash, colour changes or itching. HEAD- No Headache or dizziness. EYES- Has had some visual blurring recently URINARY- No Frequency, urgency, straining or dysuria. NEUROLOGIC- No Numbness, syncope, seizures or burning.  Physical Exam: Blood pressure 103/53, pulse 101, temperature 98.2 F (36.8 C), temperature source Oral, resp. rate 20, height 5\' 4"  (1.626 m), weight 127 lb (57.607 kg), SpO2 99 %. GENERAL- alert, co-operative, appears as stated age, not in any distress. HEENT- Atraumatic, normocephalic, Pupils dilated and reactive to light, EOMI, dry mucus membranes. CARDIAC- RRR, no tenderness to palpation of anterior chest wall, no murmurs, rubs or gallops. RESP- Moving equal volumes of  air, and clear to auscultation bilaterally, no wheezes or crackles. ABDOMEN- Soft, nontender, no palpable masses or organomegaly, bowel sounds present. BACK- Normal curvature of the spine, No tenderness along the vertebrae, no CVA tenderness. NEURO- No obvious Cr N abnormality, strenght upper and lower extremities- 5/5, EXTREMITIES- pulse 2+, symmetric, left DP harder to appreciate, but feet were warm and appeared well perfused bilaterally, no pedal edema. She has brusing bilaterally to her knees, but range of motion is normal, without swelling or effusion.   SKIN- Warm, dry, No rash or lesion. PSYCH- Normal mood and affect, appropriate thought content and speech.  Lab results: Basic Metabolic Panel:  Recent Labs  40/98/11 0750  05/03/15 1154  NA 131* 135  K 4.4 3.8  CL 96* 109  CO2 10* 12*  GLUCOSE 294* 231*  BUN 16 11  CREATININE 1.49* 1.16*  CALCIUM 9.1 7.5*    Anion gap on admission- 25.   CBC:  Recent Labs  05/03/15 0750  WBC 11.1*  NEUTROABS 7.9*  HGB 16.2*  HCT 47.4*  MCV 95.0  PLT 249   D-Dimer:  Recent Labs  05/03/15 0750  DDIMER <0.27   CBG:  Recent Labs  05/03/15 0852 05/03/15 1036 05/03/15 1157 05/03/15 1302  GLUCAP 246* 133* 174* 337*   Urine Drug Screen: Drugs of Abuse     Component Value Date/Time   LABOPIA NONE DETECTED 12/21/2014 0746   COCAINSCRNUR NONE DETECTED 12/21/2014 0746   LABBENZ NONE DETECTED 12/21/2014 0746   AMPHETMU NONE DETECTED 12/21/2014 0746   THCU NONE DETECTED 12/21/2014 0746   LABBARB NONE DETECTED 12/21/2014 0746    Urinalysis:  Recent Labs  05/03/15 0834  COLORURINE YELLOW  LABSPEC 1.025  PHURINE 5.0  GLUCOSEU >1000*  HGBUR NEGATIVE  BILIRUBINUR NEGATIVE  KETONESUR >80*  PROTEINUR NEGATIVE  UROBILINOGEN 0.2  NITRITE NEGATIVE  LEUKOCYTESUR NEGATIVE   Imaging results:  Dg Chest 2 View  05/03/2015   CLINICAL DATA:  Chest pain.  EXAM: CHEST  2 VIEW  COMPARISON:  12/21/2014 and 11/22/2011  FINDINGS:  The heart size and mediastinal contours are within normal limits. Both lungs are clear. The visualized skeletal structures are unremarkable.  IMPRESSION: No active cardiopulmonary disease.   Electronically Signed   By: Francene Boyers M.D.   On: 05/03/2015 08:44   Other results: EKG: Rates- 96 bpm, regular sinus rhythm, enlarged P waves in II, III, AVF, no PR interval abnormalities, PR- 138, QRS- 74, equivocal axis, no St segment abnormalities, some J point elevation in lead V3. Unchanged from prior, compared to 13/11/2014.  Assessment & Plan by Problem: Principal Problem:   DKA (diabetic ketoacidoses) Active Problems:   GERD (gastroesophageal reflux disease)   Diabetes mellitus   Tobacco use disorder   Depression   DM (diabetes mellitus) type 1, uncontrolled, with ketoacidosis  DKA- Per chart she is a type  ?1. DKA Consistent with Patients Bicarb- 10, Anion gap- 25, UA with ketones. Low Blood sugars- 294 on admission, likely do to poor PO intake and ongoing insulin use prior to admit. This is patients 3rd hospital admission this year for DKA. Hx of non compliance per chart. Etiology of DKA this admission, unclear, likely prior non compliance.  - Admit to tele - Pt has gotten 2 L of Normal saline, will continue hydration- 150cc/hr  - D5/NS for CBG <250 - Normal saline for CBGs- >250 - Cont Insulin infusion - CBGs- Q1H - NPO till gap closes x2 - Stat BMet - Showed Anion gap- Closed at 14, still with low bicarb- 12. - Monitor K- Now 3.8, will add K to fluids - Consult Diabetes educator  Anion Gap metabolic acidosis- Anion gap- 25, Bicarb of 10. Also venous blood gas drawn- Ph- 7.149, conversion to arterial PH (Diff between arterial and venous- 0.02 to 0.04) - PH- 7.16- 7.18. Change in anion gap~ = to Change in bicarb. Most likely anion gap is due solely to DKA. Appears pts bicarb is usually on the low side- 20s, even when her gap is about 8, she might have some RTA considering she is likely a  type 1 diabetic and mostly uncontrolled. - Bmet Q4H X2.  Diabetes- Per chart there is  some question is she is a type 1 or 2. Home meds- 70/30- 20u TID. metformin was discontinued. Unsure why patient is on TID dosing. This regimen was continued after her last 2 hospital discharge.  - When gap closes x2, consider switching to home 70/30 15u BID - Add SSI- s  Depression- she was prescribed Trazodone for sleep, she has not been taking this med. Pt asking for something for sleep.  - Cont trazodone 50mg  daily at night.  Chest pain- EKG and trops unremarkable. Admission in 10/2014, chest pain felt to be atypical at that time, but with EKG- T wave inversions in 1, AVL, with elevated trop. Cardiology was consulted, stress test- lexiscan myoview, results- No evidence of Infarct or inducible ischemia, EF- 65%, also with ECHO done on that admission, without wall motion abnormalities, EF- 50-55%. DDImer in the ED- negative. - Trops X3 - EKG am - Doubt need for aggressive work up at this time.  Fall- Likely due to dehydration. Bp appears to be low- 90s- low 100s systolic.  - Cont IVF - Check Orthostatics, though she has already gotten quite a bit of fluid in the ED - No need for xrays at this, has no joint swelling, has some bruising bilat. - Oxycodone- acetaminophen for pain  Dispo: Disposition is deferred at this time, awaiting improvement of current medical problems.   The patient does have a current PCP (Doreene Eland, MD) and does need an [facility] hospital follow-up appointment after discharge.  The patient does not know have transportation limitations that hinder transportation to clinic appointments.  Signed: Onnie Boer, MD 05/03/2015, 1:09 PM

## 2015-05-03 NOTE — Progress Notes (Addendum)
Evaluated patient overnight on account of hypotension, SBP in the 80's. Patient seen at bedside, no acute distress, resting comfortably. Had just eaten a [place] sandwich. Remains on insulin gtt at this time, given Lantus 15 units @ 10:24 PM in preparation for transition off drip @ ~12:30 AM. Patient denies nausea, vomiting, fever, chills. States she has not had cough, diarrhea, or dysuria prior to admission. No cellulitis or abscess per patient and admitting team. No tachycardia, mild leukocytosis most likely related to DKA. No fever. Etiology of hypotension unclear at this time. Patient now >2L intake since admission. EKG w/ no acute ischemic changes.  -Transition off insulin gtt as discussed above -Continue to trend troponins (x1 negative) -Repeat BMP pending -Give 1L NS bolus after insulin gtt turned off -Continue to monitor BP   Lauris Chroman, MD PGY-3, Internal Medicine Pager: [phone]   ADDENDUM: Troponin negative x2, BMP w/ AG of 9, HCO3 of 17, improved from previous. Non-AG metabolic acidosis still likely present 2/2 IVF. Proceed w/ plan above.   Lauris Chroman, MD PGY-3, Internal Medicine Pager: [phone]

## 2015-05-03 NOTE — Progress Notes (Signed)
Pt arrived from ED to room 5W23 via streycher accompanied by RN. Telephone report given. Insulin gtt infusing

## 2015-05-03 NOTE — ED Notes (Signed)
Paged Josem Kaufmann to [employer]

## 2015-05-03 NOTE — ED Notes (Signed)
EMS - Patient coming from home with c/o of left sided chest pain ongoing since yesterday at 2pm with radiation to the left arm.  Associated SOB, dizzy.  Denies vomiting.  Given  Aspirin.

## 2015-05-03 NOTE — ED Notes (Signed)
Patient given turkey sandwich and apple juice

## 2015-05-03 NOTE — ED Provider Notes (Signed)
CSN: [id number]     Arrival date & time 05/03/15  4098 History   First MD Initiated Contact with Patient 05/03/15 [phone]     Chief Complaint  Patient presents with  . Chest Pain     (Consider location/radiation/quality/duration/timing/severity/associated sxs/prior Treatment) HPI Comments: Patient is 34 year old female with history of IDDM and anxiety who presents to the emergency department for further evaluation of left-sided chest pain. Patient states that pain has been fairly constant since 1400 yesterday. She describes the pain as sharp with intermittent radiation to her left arm with some left arm tingling. Patient states that she took aspirin for symptoms without relief. She was given 324 mg aspirin by EMS prior to arrival. Symptoms associated with shortness of breath as well as dizziness. No associated leg swelling or syncope. No nausea, vomiting, abdominal pain, or fever. Patient reports similar symptoms approximately one year ago. She reports being on a Novolin 70/30 regimen. She states that she has been taking her insulin as prescribed and denies missing any doses. No history of hypertension or dyslipidemia. No history of DVT or PE.   Patient with hx of ?NSTEMI in the setting of DKA in February 2015. 2D echo during this admission completed, showing EF of 50-55%.    Patient is a 34 y.o. female presenting with chest pain. The history is provided by the patient. No language interpreter was used.  Chest Pain Associated symptoms: dizziness and shortness of breath   Associated symptoms: no abdominal pain, no fever, no nausea and not vomiting     Past Medical History  Diagnosis Date  . Asthma   . Diabetes mellitus without complication   . Headache(784.0)   . Anxiety    Past Surgical History  Procedure Laterality Date  . No past surgeries     Family History  Problem Relation Age of Onset  . Heart disease      No family history   Social History  Substance Use Topics  . Smoking  status: Current Every Day Smoker -- 0.25 packs/day for 20 years    Types: Cigarettes  . Smokeless tobacco: Never Used  . Alcohol Use: No   OB History    No data available      Review of Systems  Constitutional: Negative for fever.  Respiratory: Positive for shortness of breath.   Cardiovascular: Positive for chest pain.  Gastrointestinal: Negative for nausea, vomiting and abdominal pain.  Endocrine: Positive for polydipsia. Negative for polyuria.  Neurological: Positive for dizziness.  All other systems reviewed and are negative.   Allergies  Latex  Home Medications   Prior to Admission medications   Medication Sig Start Date End Date Taking? Authorizing Provider  aspirin EC 81 MG EC tablet Take 1 tablet (81 mg total) by mouth daily. 11/20/13  Yes Nani Ravens, MD  Aspirin-Acetaminophen-Caffeine (GOODY HEADACHE PO) Take 1 each by mouth as needed (headache).   Yes Historical Provider, MD  insulin NPH-regular Human (NOVOLIN 70/30) (70-30) 100 UNIT/ML injection Inject 20 unit in am,20 unit at noon and 20 unit at night with each meal. Hold if CBG is less than 70 Patient taking differently: Inject 20 unit in am,15 unit at noon and 20 unit at night with each meal. Hold if CBG is less than 70 02/11/15  Yes Renee A Kuneff, DO  albuterol (PROAIR HFA) 108 (90 BASE) MCG/ACT inhaler Inhale 2 puffs into the lungs every 4 (four) hours as needed for wheezing or shortness of breath. Patient not taking: Reported on  05/03/2015 12/23/14   Catarina Hartshorn, MD  albuterol (PROVENTIL HFA;VENTOLIN HFA) 108 (90 BASE) MCG/ACT inhaler Inhale 2 puffs into the lungs every 6 (six) hours as needed for wheezing or shortness of breath. 02/11/15   Renee A Kuneff, DO  Lancets (ONETOUCH ULTRASOFT) lancets Use as instructed 09/08/14   Doreene Eland, MD  traZODone (DESYREL) 50 MG tablet Take 1 tablet (50 mg total) by mouth 2 (two) times daily. Patient not taking: Reported on 02/09/2015 10/31/14   Doreene Eland, MD   BP  119/78 mmHg  Pulse 90  Temp(Src) 98.2 F (36.8 C) (Oral)  Resp 25  Ht 5\' 4"  (1.626 m)  Wt 127 lb (57.607 kg)  BMI 21.79 kg/m2  SpO2 100%   Physical Exam  Constitutional: She is oriented to person, place, and time. She appears well-developed and well-nourished. No distress.  Nontoxic/nonseptic appearing  HENT:  Head: Normocephalic and atraumatic.  Dry mm  Eyes: Conjunctivae and EOM are normal. No scleral icterus.  Neck: Normal range of motion.  Cardiovascular: Normal rate, regular rhythm and intact distal pulses.   Pulmonary/Chest: Effort normal and breath sounds normal. No respiratory distress. She has no wheezes. She has no rales.  Mild tachypnea. No respiratory distress or accessory muscle use. Lungs CTAB.  Abdominal: Soft. She exhibits no distension. There is no tenderness. There is no rebound.  Soft, nontender abdomen  Musculoskeletal: Normal range of motion.  Neurological: She is alert and oriented to person, place, and time. She exhibits normal muscle tone. Coordination normal.  Skin: Skin is warm and dry. No rash noted. She is not diaphoretic. No erythema. No pallor.  Psychiatric: She has a normal mood and affect. Her behavior is normal.  Nursing note and vitals reviewed.   ED Course  Procedures (including critical care time) Labs Review Labs Reviewed  CBC WITH DIFFERENTIAL/PLATELET - Abnormal; Notable for the following:    WBC 11.1 (*)    Hemoglobin 16.2 (*)    HCT 47.4 (*)    Neutro Abs 7.9 (*)    All other components within normal limits  BASIC METABOLIC PANEL - Abnormal; Notable for the following:    Sodium 131 (*)    Chloride 96 (*)    CO2 10 (*)    Glucose, Bld 294 (*)    Creatinine, Ser 1.49 (*)    GFR calc non Af Amer 46 (*)    GFR calc Af Amer 53 (*)    Anion gap 25 (*)    All other components within normal limits  URINALYSIS, ROUTINE W REFLEX MICROSCOPIC (NOT AT [facility]) - Abnormal; Notable for the following:    APPearance CLOUDY (*)    Glucose, UA  >1000 (*)    Ketones, ur >80 (*)    All other components within normal limits  URINE MICROSCOPIC-ADD ON - Abnormal; Notable for the following:    Squamous Epithelial / LPF MANY (*)    All other components within normal limits  CBG MONITORING, ED - Abnormal; Notable for the following:    Glucose-Capillary 246 (*)    All other components within normal limits  I-STAT VENOUS BLOOD GAS, ED - Abnormal; Notable for the following:    pH, Ven 7.149 (*)    pCO2, Ven 31.4 (*)    Bicarbonate 10.9 (*)    Acid-base deficit 17.0 (*)    All other components within normal limits  D-DIMER, QUANTITATIVE (NOT AT [facility])  ACETAMINOPHEN LEVEL  SALICYLATE LEVEL  I-STAT TROPOININ, ED  I-STAT CG4 LACTIC  ACID, ED  POC URINE PREG, ED    Imaging Review Dg Chest 2 View  05/03/2015   CLINICAL DATA:  Chest pain.  EXAM: CHEST  2 VIEW  COMPARISON:  12/21/2014 and 11/22/2011  FINDINGS: The heart size and mediastinal contours are within normal limits. Both lungs are clear. The visualized skeletal structures are unremarkable.  IMPRESSION: No active cardiopulmonary disease.   Electronically Signed   By: Francene Boyers M.D.   On: 05/03/2015 08:44     EKG Interpretation   Date/Time:  Thursday May 03 2015 07:32:50 EDT Ventricular Rate:  96 PR Interval:  138 QRS Duration: 74 QT Interval:  347 QTC Calculation: 438 R Axis:   88 Text Interpretation:  Sinus rhythm Right atrial enlargement Confirmed by  LITTLE MD, RACHEL ([id number]) on 05/03/2015 8:53:21 AM       CRITICAL CARE Performed by: Antony Madura   Total critical care time: 35  Critical care time was exclusive of separately billable procedures and treating other patients.  Critical care was necessary to treat or prevent imminent or life-threatening deterioration.  Critical care was time spent personally by me on the following activities: development of treatment plan with patient and/or surrogate as well as nursing, discussions with consultants,  evaluation of patient's response to treatment, examination of patient, obtaining history from patient or surrogate, ordering and performing treatments and interventions, ordering and review of laboratory studies, ordering and review of radiographic studies, pulse oximetry and re-evaluation of patient's condition.   Medications  insulin regular (NOVOLIN R,HUMULIN R) 250 Units in sodium chloride 0.9 % 250 mL (1 Units/mL) infusion (not administered)  insulin aspart (novoLOG) injection 5 Units (not administered)  sodium chloride 0.9 % bolus 2,000 mL (2,000 mLs Intravenous New Bag/Given 05/03/15 0907)  acetaminophen (TYLENOL) tablet 1,000 mg (1,000 mg Oral Given 05/03/15 0908)    MDM   Final diagnoses:  Chest pain  Metabolic acidosis  Diabetic ketoacidosis without coma associated with type 1 diabetes mellitus  Acute renal failure, unspecified acute renal failure type    Patient presenting for chest pain. Initial cardiac work up is reassuring; low suspicion for cardiac etiology of chest pain. Patient does have a clinical picture c/w DKA; anion gap is 25 in presence of hyperglycemia with ketones in urine. Creatinine elevated today to 1.49 from baseline of 0.62 c/w ARF. Patient being given IVF and Glucose stabilizer. Case discussed internal medicine resident service; IM to admit for management.   Filed Vitals:   05/03/15 0737 05/03/15 0815 05/03/15 0909  BP: 119/78 117/76 116/73  Pulse: 90 91 99  Temp: 98.2 F (36.8 C)    TempSrc: Oral    Resp: 25 22 14   Height: 5\' 4"  (1.626 m)    Weight: 127 lb (57.607 kg)    SpO2: 100% 100% 100%     Antony Madura, PA-C 05/03/15 0932  Laurence Spates, MD 05/03/15 [phone]

## 2015-05-03 NOTE — ED Notes (Signed)
Admitting MD at bedside.

## 2015-05-03 NOTE — Progress Notes (Deleted)
Date: 05/03/2015               Patient Name:  Kristine Allison MRN: [id number]  DOB: 01/21/1981 Age / Sex: 34 y.o., female   PCP: Doreene Eland, MD         Medical Service: Internal Medicine Teaching Service         Attending Physician: Dr. Doneen Poisson, MD    First Contact: Dr. Selina Cooley Pager: [phone]  Second Contact: Dr. Andrey Campanile Pager: [phone]       After Hours (After 5p/  First Contact Pager: [phone]  weekends / holidays): Second Contact Pager: [phone]   Chief Complaint: Chest pain  History of Present Illness: 62 Y O F  With PMH of DM- ?type 1, with medication non compliance hx, depression, tobacco abuse, GERD. Presented today with complaints of chest pain that started yesterday at about 2pm, when she was sleeping, pain woke her up from sleep. She describes the pain as shooting and sharp, located on the left side of her chest. Rates pain as a 20/10. Pain lasted all day. Pain is aggravated by deep breathing. There was no associated SOB with Chest pain, but no diaphoresis, nausea or vomiting. She says pain went down her left arm causing tingling and made her chronic back pain feel worse. She endorses having similar chest pain about 8 months ago when she was admitted here, but per chart that was February 2015. She smokes- about 6-7 cigs per day, for the past 16 yrs.  She denies recent travel, hx of blood clots, denies use frequent use of NSAIDs, takes goody powders occasionally, denies dark stools.    She says she has had a poor appetite over the past 2 days, last meal was 2 days ago, she says just has not had an appetite, She denies vomiting, diarrhea or abdominal pain. She has been taking her 70/30- 20u twice a day, despite not eating- ( She was prescribed this med for 3 times a day), and took her last dose this morning before coming to the ED. She endorses been dizzy at baseline, and has to stand for a while after getting up from sitting position, so dizziness passes before  she can move. She says she fell yesterday, bruising her knees, was out for about a second, and came to once she was on the floor. She has been able to bear weight on her legs without increased pain. She had no preceeding dizziness, lightheadedness before she fell, which was 2 hrs after her chest pain started. She denies polyuria, dysuria, cough or SOB, no fever or chills.   In the Ed she was found to have an Anion gap - 25, Bicarb of 10, venous blood gas- PH- 7.149, CBG- 294. IMTS was called to admit.   Meds: Current Facility-Administered Medications  Medication Dose Route Frequency Provider Last Rate Last Dose  . dextrose 5 %-0.9 % sodium chloride infusion   Intravenous Continuous Kohana Amble Wendall Stade, MD 150 mL/hr at 05/03/15 1210    . insulin regular (NOVOLIN R,HUMULIN R) 250 Units in sodium chloride 0.9 % 250 mL (1 Units/mL) infusion   Intravenous Continuous Antony Madura, PA-C 2.8 mL/hr at 05/03/15 1304 2.8 Units/hr at 05/03/15 1304   Current Outpatient Prescriptions  Medication Sig Dispense Refill  . aspirin EC 81 MG EC tablet Take 1 tablet (81 mg total) by mouth daily. 30 tablet 11  . Aspirin-Acetaminophen-Caffeine (GOODY HEADACHE PO) Take 1 each by mouth as needed (headache).    Marland Kitchen  insulin NPH-regular Human (NOVOLIN 70/30) (70-30) 100 UNIT/ML injection Inject 20 unit in am,20 unit at noon and 20 unit at night with each meal. Hold if CBG is less than 70 (Patient taking differently: Inject 20 unit in am,15 unit at noon and 20 unit at night with each meal. Hold if CBG is less than 70) 10 mL 4  . albuterol (PROAIR HFA) 108 (90 BASE) MCG/ACT inhaler Inhale 2 puffs into the lungs every 4 (four) hours as needed for wheezing or shortness of breath. (Patient not taking: Reported on 05/03/2015) 3.7 g 0  . albuterol (PROVENTIL HFA;VENTOLIN HFA) 108 (90 BASE) MCG/ACT inhaler Inhale 2 puffs into the lungs every 6 (six) hours as needed for wheezing or shortness of breath. 1 Inhaler 2  . Lancets (ONETOUCH  ULTRASOFT) lancets Use as instructed 100 each 12  . traZODone (DESYREL) 50 MG tablet Take 1 tablet (50 mg total) by mouth 2 (two) times daily. (Patient not taking: Reported on 02/09/2015) 60 tablet 4    Allergies: Allergies as of 05/03/2015 - Review Complete 05/03/2015  Allergen Reaction Noted  . Latex Rash 06/05/2013   Past Medical History  Diagnosis Date  . Asthma   . Diabetes mellitus without complication   . Headache(784.0)   . Anxiety    Past Surgical History  Procedure Laterality Date  . No past surgeries     Family History  Problem Relation Age of Onset  . Heart disease      No family history   Social History   Social History  . Marital Status: Divorced    Spouse Name: N/A  . Number of Children: N/A  . Years of Education: N/A   Occupational History  . Not on file.   Social History Main Topics  . Smoking status: Current Every Day Smoker -- 0.25 packs/day for 20 years    Types: Cigarettes  . Smokeless tobacco: Never Used  . Alcohol Use: No  . Drug Use: No  . Sexual Activity: Not Currently   Other Topics Concern  . Not on file   Social History Narrative    Review of Systems: CONSTITUTIONAL- No Fever, weightloss, night sweat, has reduced appetite. SKIN- No Rash, colour changes or itching. HEAD- No Headache or dizziness. EYES- Has had some visual blurring recently URINARY- No Frequency, urgency, straining or dysuria. NEUROLOGIC- No Numbness, syncope, seizures or burning.  Physical Exam: Blood pressure 103/53, pulse 101, temperature 98.2 F (36.8 C), temperature source Oral, resp. rate 20, height 5\' 4"  (1.626 m), weight 127 lb (57.607 kg), SpO2 99 %. GENERAL- alert, co-operative, appears as stated age, not in any distress. HEENT- Atraumatic, normocephalic, Pupils dilated and reactive to light, EOMI, dry mucus membranes. CARDIAC- RRR, no tenderness to palpation of anterior chest wall, no murmurs, rubs or gallops. RESP- Moving equal volumes of air, and  clear to auscultation bilaterally, no wheezes or crackles. ABDOMEN- Soft, nontender, no palpable masses or organomegaly, bowel sounds present. BACK- Normal curvature of the spine, No tenderness along the vertebrae, no CVA tenderness. NEURO- No obvious Cr N abnormality, strenght upper and lower extremities- 5/5, EXTREMITIES- pulse 2+, symmetric, left DP harder to appreciate, but feet were warm and appeared well perfused bilaterally, no pedal edema. She has brusing bilaterally to her knees, but range of motion is normal, without swelling or effusion.   SKIN- Warm, dry, No rash or lesion. PSYCH- Normal mood and affect, appropriate thought content and speech.  Lab results: Basic Metabolic Panel:  Recent Labs  96/29/52 0750  05/03/15 1154  NA 131* 135  K 4.4 3.8  CL 96* 109  CO2 10* 12*  GLUCOSE 294* 231*  BUN 16 11  CREATININE 1.49* 1.16*  CALCIUM 9.1 7.5*    Anion gap on admission- 25.   CBC:  Recent Labs  05/03/15 0750  WBC 11.1*  NEUTROABS 7.9*  HGB 16.2*  HCT 47.4*  MCV 95.0  PLT 249   D-Dimer:  Recent Labs  05/03/15 0750  DDIMER <0.27   CBG:  Recent Labs  05/03/15 0852 05/03/15 1036 05/03/15 1157 05/03/15 1302  GLUCAP 246* 133* 174* 337*   Urine Drug Screen: Drugs of Abuse     Component Value Date/Time   LABOPIA NONE DETECTED 12/21/2014 0746   COCAINSCRNUR NONE DETECTED 12/21/2014 0746   LABBENZ NONE DETECTED 12/21/2014 0746   AMPHETMU NONE DETECTED 12/21/2014 0746   THCU NONE DETECTED 12/21/2014 0746   LABBARB NONE DETECTED 12/21/2014 0746    Urinalysis:  Recent Labs  05/03/15 0834  COLORURINE YELLOW  LABSPEC 1.025  PHURINE 5.0  GLUCOSEU >1000*  HGBUR NEGATIVE  BILIRUBINUR NEGATIVE  KETONESUR >80*  PROTEINUR NEGATIVE  UROBILINOGEN 0.2  NITRITE NEGATIVE  LEUKOCYTESUR NEGATIVE   Imaging results:  Dg Chest 2 View  05/03/2015   CLINICAL DATA:  Chest pain.  EXAM: CHEST  2 VIEW  COMPARISON:  12/21/2014 and 11/22/2011  FINDINGS: The  heart size and mediastinal contours are within normal limits. Both lungs are clear. The visualized skeletal structures are unremarkable.  IMPRESSION: No active cardiopulmonary disease.   Electronically Signed   By: Francene Boyers M.D.   On: 05/03/2015 08:44   Other results: EKG: Rates- 96 bpm, regular sinus rhythm, enlarged P waves in II, III, AVF, no PR interval abnormalities, PR- 138, QRS- 74, equivocal axis, no St segment abnormalities, some J point elevation in lead V3. Unchanged from prior, compared to 13/11/2014.  Assessment & Plan by Problem: Principal Problem:   DKA (diabetic ketoacidoses) Active Problems:   GERD (gastroesophageal reflux disease)   Diabetes mellitus   Tobacco use disorder   Depression   DM (diabetes mellitus) type 1, uncontrolled, with ketoacidosis  DKA- Per chart she is a type  ?1. DKA Consistent with Patients Bicarb- 10, Anion gap- 25, UA with ketones. Low Blood sugars- 294 on admission, likely do to poor PO intake and ongoing insulin use prior to admit. This is patients 3rd hospital admission this year for DKA. Hx of non compliance per chart. Etiology of DKA this admission, unclear, likely prior non compliance.  - Admit to tele - Pt has gotten 2 L of Normal saline, will continue hydration- 150cc/hr  - D5/NS for CBG <250 - Normal saline for CBGs- >250 - Cont Insulin infusion - CBGs- Q1H - NPO till gap closes x2 - Stat BMet - Showed Anion gap- Closed at 14, still with low bicarb- 12. - Monitor K- Now 3.8, will add K to fluids - Consult Diabetes educator  Anion Gap metabolic acidosis- Anion gap- 25, Bicarb of 10. Also venous blood gas drawn- Ph- 7.149, conversion to arterial PH (Diff between arterial and venous- 0.02 to 0.04) - PH- 7.16- 7.18. Change in anion gap~ = to Change in bicarb. Most likely anion gap is due solely to DKA. Appears pts bicarb is usually on the low side- 20s, even when her gap is about 8, she might have some RTA considering she is likely a type  1 diabetic and mostly uncontrolled. - Bmet Q4H X2.  Diabetes- Per chart there is  some question is she is a type 1 or 2. Home meds- 70/30- 20u TID. metformin was discontinued. Unsure why patient is on TID dosing. This regimen was continued after her last 2 hospital discharge.  - When gap closes x2, consider switching to home 70/30 15u BID - Add SSI- s  Depression- she was prescribed Trazodone for sleep, she has not been taking this med. Pt asking for something for sleep.  - Cont trazodone  daily at night.  Chest pain- EKG and trops unremarkable. Admission in 10/2014, chest pain felt to be atypical at that time, but with EKG- T wave inversions in 1, AVL, with elevated trop. Cardiology was consulted, stress test- lexiscan myoview, results- No evidence of Infarct or inducible ischemia, EF- 65%, also with ECHO done on that admission, without wall motion abnormalities, EF- 50-55%. - Trops X3 - EKG am  Fall- Likely due to dehydration. Bp appears to be low- 90s- low 100s systolic.  - Cont IVF - Check Orthostatics, though she has already gotten quite a bit of fluid in the ED - No need for xrays at this, has no joint swelling, has some bruising bilat. - Oxycodone- acetaminophen for pain  Dispo: Disposition is deferred at this time, awaiting improvement of current medical problems.   The patient does have a current PCP (Doreene Eland, MD) and does need an [facility] hospital follow-up appointment after discharge.  The patient does not know have transportation limitations that hinder transportation to clinic appointments.  Signed: Onnie Boer, MD 05/03/2015, 1:09 PM

## 2015-05-03 NOTE — ED Notes (Signed)
Meal tray ordered for patient.

## 2015-05-04 DIAGNOSIS — Z9119 Patient's noncompliance with other medical treatment and regimen: Secondary | ICD-10-CM

## 2015-05-04 LAB — BASIC METABOLIC PANEL
ANION GAP: 9 (ref 5–15)
Anion gap: 6 (ref 5–15)
BUN: 7 mg/dL (ref 6–20)
CO2: 17 mmol/L — ABNORMAL LOW (ref 22–32)
CO2: 18 mmol/L — ABNORMAL LOW (ref 22–32)
Calcium: 7.5 mg/dL — ABNORMAL LOW (ref 8.9–10.3)
Calcium: 7.4 mg/dL — ABNORMAL LOW (ref 8.9–10.3)
Chloride: 108 mmol/L (ref 101–111)
Chloride: 110 mmol/L (ref 101–111)
Creatinine, Ser: 0.76 mg/dL (ref 0.44–1.00)
Creatinine, Ser: 0.67 mg/dL (ref 0.44–1.00)
GFR calc Af Amer: >60 mL/min (ref >60)
Glucose, Bld: 213 mg/dL — ABNORMAL HIGH (ref 65–99)
Glucose, Bld: 319 mg/dL — ABNORMAL HIGH (ref 65–99)
POTASSIUM: 3.8 mmol/L (ref 3.5–5.1)
POTASSIUM: 3.7 mmol/L (ref 3.5–5.1)
SODIUM: 134 mmol/L — AB (ref 135–145)
Sodium: 134 mmol/L — ABNORMAL LOW (ref 135–145)

## 2015-05-04 LAB — GLUCOSE, CAPILLARY
GLUCOSE-CAPILLARY: 183 mg/dL — AB (ref 65–99)
GLUCOSE-CAPILLARY: 407 mg/dL — AB (ref 65–99)
GLUCOSE-CAPILLARY: 376 mg/dL — AB (ref 65–99)
GLUCOSE-CAPILLARY: 425 mg/dL — AB (ref 65–99)
Glucose-Capillary: 162 mg/dL — ABNORMAL HIGH (ref 65–99)
Glucose-Capillary: 206 mg/dL — ABNORMAL HIGH (ref 65–99)
Glucose-Capillary: 368 mg/dL — ABNORMAL HIGH (ref 65–99)

## 2015-05-04 LAB — TROPONIN I
Troponin I: <0.03 ng/mL (ref <0.031)
Troponin I: <0.03 ng/mL (ref <0.031)

## 2015-05-04 MED ORDER — INSULIN ASPART 100 UNIT/ML ~~LOC~~ SOLN
0.0000 [IU] | Freq: Three times a day (TID) | SUBCUTANEOUS | Status: DC
  Administered 2015-05-04 (×2): 2 [IU] via SUBCUTANEOUS

## 2015-05-04 MED ORDER — INSULIN ASPART 100 UNIT/ML ~~LOC~~ SOLN
0.0000 [IU] | Freq: Three times a day (TID) | SUBCUTANEOUS | Status: DC
  Administered 2015-05-05: 2 [IU] via SUBCUTANEOUS

## 2015-05-04 MED ORDER — INSULIN ASPART 100 UNIT/ML ~~LOC~~ SOLN
0.0000 [IU] | Freq: Every day | SUBCUTANEOUS | Status: DC
  Administered 2015-05-04: 5 [IU] via SUBCUTANEOUS

## 2015-05-04 MED ORDER — INSULIN ASPART 100 UNIT/ML ~~LOC~~ SOLN
0.0000 [IU] | Freq: Every day | SUBCUTANEOUS | Status: DC
  Administered 2015-05-04: 2 [IU] via SUBCUTANEOUS

## 2015-05-04 MED ORDER — SODIUM CHLORIDE 0.9 % IV BOLUS (SEPSIS)
500.0000 mL | Freq: Once | INTRAVENOUS | Status: AC
  Administered 2015-05-04: 500 mL via INTRAVENOUS

## 2015-05-04 MED ORDER — INSULIN ASPART PROT & ASPART (70-30 MIX) 100 UNIT/ML ~~LOC~~ SUSP
20.0000 [IU] | Freq: Two times a day (BID) | SUBCUTANEOUS | Status: DC
  Administered 2015-05-04 – 2015-05-05 (×2): 20 [IU] via SUBCUTANEOUS
  Filled 2015-05-04: qty 10

## 2015-05-04 MED ORDER — SODIUM CHLORIDE 0.9 % IV BOLUS (SEPSIS)
1000.0000 mL | Freq: Once | INTRAVENOUS | Status: AC
Start: 2015-05-04 — End: 2015-05-04
  Administered 2015-05-04: 1000 mL via INTRAVENOUS

## 2015-05-04 MED ORDER — SODIUM CHLORIDE 0.9 % IV SOLN
INTRAVENOUS | Status: AC
  Administered 2015-05-04: 125 mL/h via INTRAVENOUS
  Administered 2015-05-04: 1000 mL via INTRAVENOUS

## 2015-05-04 NOTE — Progress Notes (Signed)
Internal Medicine Attending  Date: 05/04/2015  Patient name: Kristine Allison Medical record number: [id number] Date of birth: 11/10/1980 Age: 34 y.o. Gender: female  I saw and evaluated the patient. I reviewed the resident's note by Dr. Andrey Campanile and I agree with the resident's findings and plans as documented in her progress note.  Please see my H&P dated 05/04/2015 and attached to Dr. Fredirick Lathe H&P dated 05/03/2015 for the specifics of my evaluation, assessment, and plan. I suspect her DKA was secondary to not taking insulin (true insulin deficiency). We appreciate the diabetic coordinator's assistance, education and advice.

## 2015-05-04 NOTE — Progress Notes (Addendum)
Inpatient Diabetes Program Recommendations  AACE/ADA: New Consensus Statement on Inpatient Glycemic Control (2013)  Target Ranges:  Prepandial:   less than 140 mg/dL      Peak postprandial:   less than 180 mg/dL (1-2 hours)      Critically ill patients:  140 - 180 mg/dL   Consult received for uncontrolled diabetes. Spoke with Med student on her care team regarding the glucose values not appearing indicative of dx of DKA. Discussed that pt with type 1 dm can develop DKA even with glucose in upper 200's as well as higher.Lack of sufficient basal insulin is a likely cause of DKA if pt has been non-compliant with her insulin regimen. Noted on her med reconciliation that pt states she takes Novolin N and R 70/30 with dosages of 20 units in the am and 15 units at lunch and 20 units for supper. (Some regimens are recommended at tid, however they are typically instructed to take am, ac supper and at HS)  However, also noted that pt is to "hold if blood sugar is less than 70 mg/dL" Patients with type 1 cannot omit their basal insulin due to lower glucose levels as there is no mechanism to fuel the body's cell for energy/metabolism Thus she would utilize other means of life sustaining metabolism, that of fat stores causing acidosis. Will talk with patient regarding her adherence to eating and insulin administration. Will document assessed needs to prevent further DKA. Addendum: Insulin needs based on weight in kg require an approximate total of 46 units/day, 1/2 basal and 1/2 bolus meal coverage and or correction. Using her present prescribed regimen of 70/30 tid totals 55 units total daily dose, 36 units basal NPH and 18 units Reg. This dose may be too much for her weight at this time. If continue 70/30 at discharge, would recommend 23 units in the am and 23 units ac supper (with a low dose correction of R in the am and ac supper.)  Thank you Lenor Coffin, RN, MSN, CDE  Diabetes Inpatient Program Office:  [phone] Pager: [phone] 8:00 am to 5:00 pm

## 2015-05-04 NOTE — Progress Notes (Signed)
Utilization review completed. Philomina Leon, RN, BSN. 

## 2015-05-04 NOTE — Progress Notes (Signed)
Paged MD to advise patients blood sugar results. Awaiting response.

## 2015-05-04 NOTE — Progress Notes (Signed)
Paged MD to advise of blood sugar at this time per his request. Spoke with Dr Earlene Plater, no new orders received at this time. Will monitor patient.

## 2015-05-04 NOTE — Progress Notes (Signed)
Subjective: [name] was seen and examined this AM.  She is feeling better, appetite is good, no N/V, no CP or weakness.  Objective: Vital signs in last 24 hours: Filed Vitals:   05/03/15 2332 05/04/15 0126 05/04/15 0645 05/04/15 0715  BP: 80/50 90/66 92/60  108/59  Pulse:    61  Temp:    98.5 F (36.9 C)  TempSrc:    Oral  Resp:    16  Height:      Weight:      SpO2:    99%   Weight change:   Intake/Output Summary (Last 24 hours) at 05/04/15 1142 Last data filed at 05/04/15 0958  Gross per 24 hour  Intake 3557.8 ml  Output   2300 ml  Net 1257.8 ml   General: sitting up in bed in NAD HEENT: [place]/AT, hair loss noted Cardiac: RRR, no rubs, murmurs or gallops Pulm: clear to auscultation bilaterally, moving normal volumes of air Abd: soft, nontender, nondistended, BS present Ext: warm and well perfused, no pedal edema Neuro: alert and oriented X3, MAE spontaneously, responding appropriately  Lab Results: Basic Metabolic Panel:  Recent Labs Lab 05/03/15 2321 05/04/15 0315  NA 134* 134*  K 3.8 3.7  CL 108 110  CO2 17* 18*  GLUCOSE 213* 319*  BUN 7 <5*  CREATININE 0.76 0.67  CALCIUM 7.5* 7.4*   Cardiac Enzymes:  Recent Labs Lab 05/03/15 1543 05/03/15 2321 05/04/15 0315  TROPONINI <0.03 <0.03 <0.03   CBG:  Recent Labs Lab 05/03/15 1952 05/03/15 2112 05/03/15 2218 05/03/15 2324 05/04/15 0024 05/04/15 0759  GLUCAP 159* 178* 165* 188* 206* 162*   Medications: I have reviewed the patient's current medications. Scheduled Meds: . aspirin EC  81 mg Oral Daily  . enoxaparin (LOVENOX) injection  40 mg Subcutaneous Q24H  . insulin aspart  0-5 Units Subcutaneous QHS  . insulin aspart  0-9 Units Subcutaneous TID WC  . insulin glargine  15 Units Subcutaneous QHS  . sodium chloride  3 mL Intravenous Q12H  . traZODone  50 mg Oral QHS   Continuous Infusions: . sodium chloride 125 mL/hr (05/04/15 0326)   PRN Meds:.albuterol,  oxyCODONE-acetaminophen Assessment/Plan: 34 year old woman with DM type 1 admitted for DKA.  DKA in uncontrolled type 1 diabetic:  Resolved.  Unclear precipitant.  She presented with CP but no EKG changes and negative troponin.  No evidence of infection.  Appreciate DM educator assistance and recommendations.  She noted that patient's med instruction was to hold 70/30 for CBG < 70.  Since she is type 1, holding insulin for lows may have led to DKA.  - STOP Lantus and SSI; RESUME Novolin 70/30 - will give 20 units BID (TDD is ~ 46 units, will increase tomorrow if needed) - carb mod diet - CBG ac/hs - monitor BMP qday  Chest pain:  No CP this AM.  R/o for ACS.  Negative stress in 2015.  Hypocalcemia:  Asymptomatic.  Checking albumin and PTH.  My colleague brought up the possibility of polyglandular autoimmune syndrome because of the patient's type 1 DM, low BP and low Ca. - check PTH, AM cortisol and ablumin  Dispo: Disposition is deferred at this time, awaiting improvement of current medical problems.  Anticipated discharge in approximately 1 day(s).   The patient does have a current PCP (Kristine Eland, MD) and does not need an [facility] hospital follow-up appointment after discharge.  The patient does not know have transportation limitations that hinder transportation to  clinic appointments.  .Services Needed at time of discharge: Y = Yes, Blank = No PT:   OT:   RN:   Equipment:   Other:     LOS: 1 day   Kristine Manges, DO 05/04/2015, 11:42 AM

## 2015-05-04 NOTE — Care Management Note (Signed)
Case Management Note  Patient Details  Name: Kristine Allison MRN: [id number] Date of Birth: [birth year]  Subjective/Objective:    Patient is from home, pta indep.  NCM will cont to follow for dc needs.                Action/Plan:   Expected Discharge Date:                  Expected Discharge Plan:  Home/Self Care  In-House Referral:     Discharge planning Services  CM Consult  Post Acute Care Choice:    Choice offered to:     DME Arranged:    DME Agency:     HH Arranged:    HH Agency:     Status of Service:  In process, will continue to follow  Medicare Important Message Given:    Date Medicare IM Given:    Medicare IM give by:    Date Additional Medicare IM Given:    Additional Medicare Important Message give by:     If discussed at Long Length of Stay Meetings, dates discussed:    Additional Comments:  Leone Haven, RN 05/04/2015, 2:13 PM

## 2015-05-04 NOTE — Progress Notes (Addendum)
Inpatient Diabetes Program Recommendations  AACE/ADA: New Consensus Statement on Inpatient Glycemic Control (2013)  Target Ranges:  Prepandial:   less than 140 mg/dL      Peak postprandial:   less than 180 mg/dL (1-2 hours)      Critically ill patients:  140 - 180 mg/dL   Have spoken with patient at length as to her diabetes history and insulin regimens. Pt states she has had diabetes and has been on insulin for a little over a year. She states she has not had much education other than talking with whom she thinks was a dietician 2 years ago. She states that she was on lantus and novolog as her first regimen. States she was on lantus 15 units and meal coverage tidwc (not sure of dose of meal coverage). Pt states she has never been on correction insulin but after talking about it, she seems to understand the concept. States that the MD she was seeing at [facility] changed her from lantus and novolog to novolin NPH/Reg tidwc. Although 70/30 is sometimes used three times a day for control, it is ordered for morning, supper and bedtime. She was told to take it before breakfast, lunch and supper. In that case, the insulin would have a stacking affect on glucose putting her at increased risk for hypoglycemia in the afternoon and evening hours into the early am hours. Pt states she was constantly hungry once taking that amount of insulin-thus she would tend to eat foods that rapidly raised her blood sugar. She states that if her glucose was 'low' <70 mg/dL, she did not take her insulin as scheduled. Most likely the DKA is a result of not having basal insulin on board for a few days at a time.  Pt's home dose was prescribed to take 20 units 70/30 in the am, 15 units at lunch, and 20 units at supper. This is a total daily dose of 55 units. Pt weighs 57 kg. Using a total daily dose of 0.8 units/kg (minimally for type 1), her daily dose should calculate to a total of 46 units, typically 1/2 as basal and 1/2 as meal  coverage and/or correction. In light of the patient typically running higher glucose values, would recommend her basal at 15-20 units daily and 3 units meal coverage and sensitive correction tidwc (as ordered presently in hospital). Pt states she doesn't presently have a meter because hers broke-she uses her mom's meter and strips. When asked if she would be agreeable to checking cbg's 3-4 times a day, she stated she would if supplies were available. I would recommend that patient get an appt with [facility] where if she has an appointment, she can get her insulins at the pharmacy here at [facility] and/or clinic. Pt can also get a meter and supplies through this clinic as well. Pt appears extremely depressed, on the verge of tears and states she is afraid to take her insulin if she hasn't eaten.  I explained to her that she has to have basal insulin on board to avoid DKA and if not eating, at least take the correction novolog if it does not include meal coverage.She states she was unaware that she must take some insulin even if glucose is low and she isn't eating.  Explained to her that she needed to get her blood sugar back up (using survival skills of treatment) and must take her basal ever 24 hrs. If novolin 70/30 is her only option for discharge, please  consider order for novolin 70/30 at 15-20 units bid-am and ac supper only. If her blood sugars are elevated with this, depending on when her cbg's are high she could increase the 70/30 dose slightly at that time. If pt cannot get into the [facility], pt can get a [employer] meter and strips at a much lower cost than the other commercial meters. I will order a care management consult to assess eligibility to go to the [facility].  Thank you Lenor Coffin, RN, MSN, CDE  Diabetes Inpatient Program Office: [phone] Pager: [phone] 8:00 am to 5:00 pm

## 2015-05-04 NOTE — Progress Notes (Signed)
Subjective:    Currently, the patient is feeling much improved. Her appetite returned yesterday afternoon and she was able to eat lunch and dinner without any N/V. She was looking forward to breakfast this morning. She has been walking to the bathroom and tolerating this well without complaints of dizziness or weakness. She denies any further chest pain. Her knees are painful to palpation and with extreme ROM, but there is no pain with wt bearing or ambulation.  Interval Events: Hypotensive with SBPs in the 80s O/N. Asymptomatic. Given 1L NS bolus. Trops cycled and neg. Transitioned from ggt to SQ insulin - Lantus 15u O/N   Objective:    Vital Signs:   Temp:  [98.1 F (36.7 C)-98.5 F (36.9 C)] 98.5 F (36.9 C) (08/12 0715) Pulse Rate:  [61-101] 61 (08/12 0715) Resp:  [14-30] 16 (08/12 0715) BP: (80-121)/(42-66) 108/59 mmHg (08/12 0715) SpO2:  [97 %-100 %] 99 % (08/12 0715)    24-hour weight change: Weight change:   Intake/Output:   Intake/Output Summary (Last 24 hours) at 05/04/15 1218 Last data filed at 05/04/15 0958  Gross per 24 hour  Intake 3557.8 ml  Output   2300 ml  Net 1257.8 ml      Physical Exam: General: Vital signs reviewed and noted. Well-developed, well-nourished, in no acute distress; alert, appropriate and cooperative throughout examination.  Lungs:  Normal respiratory effort. Clear to auscultation BL without crackles or wheezes.  Heart: RRR. S1 and S2 normal without gallop, murmur, or rubs.  Abdomen:  BS normoactive. Soft, Nondistended, non-tender.  No masses or organomegaly.  Extremities: No pretibial edema. Bruising on the anterior aspect of knees bilaterally. TTP. Full ROM.     Labs:  Basic Metabolic Panel:  Recent Labs Lab 05/03/15 1154 05/03/15 1543 05/03/15 2034 05/03/15 2321 05/04/15 0315  NA 135 136 135 134* 134*  K 3.8 3.5 3.4* 3.8 3.7  CL 109 111 109 108 110  CO2 12* 16* 16* 17* 18*  GLUCOSE 231* 206* 179* 213* 319*  BUN <5*  CREATININE 1.16* 0.98 0.72 0.76 0.67  CALCIUM 7.5* 7.5* 7.6* 7.5* 7.4*   CBC:  Recent Labs Lab 05/03/15 0750  WBC 11.1*  NEUTROABS 7.9*  HGB 16.2*  HCT 47.4*  MCV 95.0  PLT 249    Cardiac Enzymes:  Recent Labs Lab 05/03/15 1543 05/03/15 2321 05/04/15 0315  TROPONINI <0.03 <0.03 <0.03    CBG:  Recent Labs Lab 05/03/15 2218 05/03/15 2324 05/04/15 0024 05/04/15 0759 05/04/15 1142  GLUCAP 165* 188* 206* 162* 183*    Other results: EKG: unchanged from previous  Imaging: Dg Chest 2 View  05/03/2015   CLINICAL DATA:  Chest pain.  EXAM: CHEST  2 VIEW  COMPARISON:  12/21/2014 and 11/22/2011  FINDINGS: The heart size and mediastinal contours are within normal limits. Both lungs are clear. The visualized skeletal structures are unremarkable.  IMPRESSION: No active cardiopulmonary disease.   Electronically Signed   By: Francene Boyers M.D.   On: 05/03/2015 08:44       Medications:    Infusions: . sodium chloride 125 mL/hr (05/04/15 0326)    Scheduled Medications: . aspirin EC  81 mg Oral Daily  . enoxaparin (LOVENOX) injection  40 mg Subcutaneous Q24H  . insulin aspart  0-5 Units Subcutaneous QHS  . insulin aspart  0-9 Units Subcutaneous TID WC  . insulin glargine  15 Units Subcutaneous QHS  . sodium chloride  3 mL Intravenous Q12H  .  traZODone  50 mg Oral QHS    PRN Medications: albuterol, oxyCODONE-acetaminophen   Assessment/ Plan:    Pt is a 34 y.o. yo female with a PMHx of t1?DM (poor medication compliance), depression, and tobacco abuse  who was admitted on 05/03/2015 with symptoms of atypical chest pain, which was determined to be non-ischemic by serial CE and ECG. She was noted to be in DKA with an AG acidosis, hyperglycemia and dehydration at the time of presentation. Interventions at this time will be focused on continued hydration and w/u of possible autoimmune etiologies for DKA.   DKA - Presented with low Bicarb (10, now resolving at  18), Anion gap (25, now closed at 6), UA with glc/ketones. BG was low (294) on admission, likely do to poor PO intake and ongoing insulin use prior to admit. 3rd hospital admission this year for DKA. Hx of non compliance per chart. - d/c tele - continue IVF NS @ 125cc/hr (end at 1500) - advance SQ insulin toward home dose, 20u Novalog 70/30 BID - CBGs q6h - Carb restrict diet - BMet in AM to monitor Ca, K, HCO3  Anion Gap metabolic acidosis - Resolved. 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 Baseline HCO3 appears to be 20-21 - Bmet in AM - Continue IVF as above  Hypocalcemia - Ca 7.5. No clear etiology. Denies trismus or Trousseau's sign. Will investigate polyglandular autoimmune syndrome with PTH, AM cortisol, and albumin.  Diabetes - Home meds: Novalog 70/30 20u TID. Uncertain why patient is dosed TID, will consult diabetes coordinator. - insulin as above - SSI-s TID qAC + qHS  Depression - she was prescribed Trazodone for sleep, she has not been taking this med. She reports difficulty sleeping and was given trazodone last night. This likely contributed to her hypotension. Will hold trazodone and plan to d/c at discharge - d/c trazodone  Chest pain - Resolved. EKG and trops unremarkable. D-dimer neg. Admission in  Feb 2015 for atypical chest pain with negative ischemic w/u including stress and echo (EF 50-55%). - Trops neg. - EKG unremarkable - now resolved, unlikely ischemia, defer further w/u  Fall - Likely due to dehydration. BP appears to be low- 90s- low 100s systolic.  - Cont IVF - Check Orthostatics, though she has already gotten quite a bit of fluid in the ED - No need for xrays at this, has no joint swelling, has some bruising bilat. - Oxycodone- acetaminophen for pain  FEN/GI -  - IVF until this afternoon - transition to PO hydration in PM - carb restrict diet   DVT PPX - low molecular weight heparin  CODE STATUS -  Full  CONSULTS PLACED - None  DISPO - Disposition is deferred at this time, awaiting improvement of hydration status.   Anticipated discharge in approximately 1 day(s).   The patient does have a current PCP (Janit Pagan, MD) and does need an [facility] hospital follow-up appointment after discharge.    Is the [facility] hospital follow-up appointment a one-time only appointment? no.  Does the patient have transportation limitations that hinder transportation to clinic appointments? no   SERVICE NEEDED AT DISCHARGE - TO BE DETERMINED DURING HOSPITAL COURSE         Y = Yes, Blank = No PT:   OT:   RN:   Equipment:   Other:      Length of Stay: 1 day(s)   This is a Psychologist, occupational Note.  The care  of the patient was discussed with Dr. Andrey Campanile and the assessment and plan formulated with their assistance.  Please see their attached note for official documentation of the daily encounter.  Burna Cash, MS4 Pager: [phone] (7AM-5PM) 05/04/2015, 12:18 PM

## 2015-05-04 NOTE — Progress Notes (Signed)
Received call back from Dr Earlene Plater, was told to give patient 5 Units of sliding scale and re-check BS in one hour.

## 2015-05-05 DIAGNOSIS — R0789 Other chest pain: Secondary | ICD-10-CM

## 2015-05-05 LAB — GLUCOSE, CAPILLARY
GLUCOSE-CAPILLARY: 120 mg/dL — AB (ref 65–99)
Glucose-Capillary: 124 mg/dL — ABNORMAL HIGH (ref 65–99)

## 2015-05-05 LAB — ALBUMIN: ALBUMIN: 2.8 g/dL — AB (ref 3.5–5.0)

## 2015-05-05 LAB — CORTISOL-AM, BLOOD: Cortisol - AM: 8.5 ug/dL (ref 6.7–22.6)

## 2015-05-05 MED ORDER — INSULIN NPH ISOPHANE & REGULAR (70-30) 100 UNIT/ML ~~LOC~~ SUSP: 20.0000 [IU] | Freq: Two times a day (BID) | SUBCUTANEOUS | Status: DC

## 2015-05-05 MED ORDER — IBUPROFEN 800 MG PO TABS: 800.0000 mg | ORAL_TABLET | Freq: Three times a day (TID) | ORAL | Status: DC | PRN

## 2015-05-05 NOTE — Progress Notes (Signed)
   Subjective: Kristine Allison was seen and examined this AM.  She ate breakfast but appetite good and she would like more food, no N/V, no CP or weakness.  Objective: Vital signs in last 24 hours: Filed Vitals:   05/04/15 0715 05/04/15 1420 05/04/15 2149 05/05/15 0553  BP: 108/59 96/47 115/69 115/78  Pulse: 61 83 85 71  Temp: 98.5 F (36.9 C) 97.8 F (36.6 C) 97.6 F (36.4 C) 98.4 F (36.9 C)  TempSrc: Oral Oral Oral Oral  Resp: Height:      Weight:      SpO2: 99% 99% 98% 99%   Weight change:   Intake/Output Summary (Last 24 hours) at 05/05/15 0753 Last data filed at 05/05/15 0500  Gross per 24 hour  Intake    860 ml  Output   1200 ml  Net   -340 ml   General: sitting up in bed in NAD HEENT: [place]/AT Cardiac: RRR, no rubs, murmurs or gallops Pulm: clear to auscultation bilaterally, moving normal volumes of air Abd: soft, nontender, nondistended, BS present Ext: warm and well perfused, no pedal edema Neuro: alert and oriented X3, MAE spontaneously, responding appropriately  Lab Results:  CBG:  Recent Labs Lab 05/04/15 1646 05/04/15 1944 05/04/15 2158 05/04/15 2330 05/05/15 0757 05/05/15 1214  GLUCAP 407* 376* 425* 368* 120* 124*   Medications: I have reviewed the patient's current medications. Scheduled Meds: . aspirin EC  81 mg Oral Daily  . enoxaparin (LOVENOX) injection  40 mg Subcutaneous Q24H  . insulin aspart  0-15 Units Subcutaneous TID WC  . insulin aspart  0-5 Units Subcutaneous QHS  . insulin aspart protamine- aspart  20 Units Subcutaneous BID WC  . sodium chloride  3 mL Intravenous Q12H   Continuous Infusions: none   PRN Meds:.albuterol, oxyCODONE-acetaminophen   Assessment/Plan: 34 year old woman with DM type 1 admitted for DKA.  DKA in uncontrolled type 1 diabetic:  Resolved.  Likely due to not taking insulin when she was not eating.  Appreciate DM coordinator recommendations and interventions. - Continue Novolin 70/30 (20  units BID with meals) - appreciate care manager assistance with obtaining CBG meter - patient will follow-up with her PCP  Chest pain:  No CP this AM.  R/o for ACS.  Negative stress in 2015.  Hypocalcemia:  Corrected calcium is 8.4, so slightly low.  Asymptomatic.  My colleague brought up the possibility of polyglandular autoimmune syndrome because of the patient's type 1 DM, low BP and low Ca.  PTH pending and this lab can be followed up at PCP visit.  Dispo: She is medically stable for d/c home today with follow-up with PCP in 1 week.  The patient does have a current PCP (Doreene Eland, MD) and does not need an [facility] hospital follow-up appointment after discharge.  The patient does not know have transportation limitations that hinder transportation to clinic appointments.  .Services Needed at time of discharge: Y = Yes, Blank = No PT:   OT:   RN:   Equipment:   Other:     LOS: 2 days   Yolanda Manges, DO 05/05/2015, 7:53 AM

## 2015-05-05 NOTE — Progress Notes (Signed)
Discharge instructions given and patient verbalized understanding. 

## 2015-05-05 NOTE — Care Management Note (Addendum)
Case Management Note  Patient Details  Name: Kristine Allison MRN: [id number] Date of Birth: 05-Aug-1981  Subjective/Objective:                   Chest pain 1 day/DKA Action/Plan:  Discharge planning Expected Discharge Date:  05/08/15               Expected Discharge Plan:  Home/Self Care  In-House Referral:     Discharge planning Services  CM Consult, [facility]igent [facility]  Post Acute Care Choice:    Choice offered to:     DME Arranged:    DME Agency:     HH Arranged:    HH Agency:     Status of Service:  Completed, signed off  Medicare Important Message Given:    Date Medicare IM Given:    Medicare IM give by:    Date Additional Medicare IM Given:    Additional Medicare Important Message give by:     If discussed at Long Length of Stay Meetings, dates discussed:    Additional Comments: CM spoke with pt who is well known with CM concerning medication needs  Pt is seen by [name] and states she will go to the walk in clinic Monday.  Pt states she has her medication.  Spoke with pt as she consistently has medication issues as she continues to NOT secure insurance.  IF pt goes to clinic, she states she will pursue an appointment with a NAVIGATOR to secure insurance. CM No other CM needs were communicated. [name], RN 05/05/2015, 4:03 PM

## 2015-05-05 NOTE — Discharge Summary (Signed)
Name: Kristine Allison MRN: [id number] DOB: Jan 23, 1981 33 y.o. PCP: Doreene Eland, MD  Date of Admission: 05/03/2015  7:26 AM Date of Discharge: 05/05/2015 Attending Physician: Doneen Poisson, MD  Discharge Diagnosis:   Diabetic ketoacidoses in the setting of Diabetes mellitus type 1, uncontrolled   Atypical chest pain   Hypocalcemia   Discharge Medications:   Medication List    STOP taking these medications        GOODY HEADACHE PO      TAKE these medications        albuterol 108 (90 BASE) MCG/ACT inhaler  Commonly known as:  PROVENTIL HFA;VENTOLIN HFA  Inhale 2 puffs into the lungs every 6 (six) hours as needed for wheezing or shortness of breath.     aspirin 81 MG EC tablet  Take 1 tablet (81 mg total) by mouth daily.     ibuprofen 800 MG tablet  Commonly known as:  ADVIL,MOTRIN  Take 1 tablet (800 mg total) by mouth every 8 (eight) hours as needed (knee pain).     insulin NPH-regular Human (70-30) 100 UNIT/ML injection  Commonly known as:  NOVOLIN 70/30  Inject 20 Units into the skin 2 (two) times daily with a meal.     onetouch ultrasoft lancets  Use as instructed     traZODone 50 MG tablet  Commonly known as:  DESYREL  Take 1 tablet (50 mg total) by mouth 2 (two) times daily.        Disposition and follow-up:   Ms.Kristine Allison was discharged from [facility] in Good condition.  At the hospital follow up visit please address:  1.  Diabetes:   Has she been compliant with twice daily Novolin 70/30?  Ideally, a basal/bolus regimen or insulin pump would be preferred in Type 1, however, she will need assistance in overcoming financial barriers.  2.  Labs / imaging needed at time of follow-up: none  3.  Pending labs/ test needing follow-up: PTH (evaluation of hypocalcemia)  Follow-up Appointments: Follow-up Information    Follow up with [facility]    .   Contact information:   [address] [place] [address] [phone]      Discharge Instructions: Discharge Instructions    Activity as tolerated - No restrictions    Complete by:  As directed      Call MD for:  difficulty breathing, headache or visual disturbances    Complete by:  As directed      Call MD for:  extreme fatigue    Complete by:  As directed      Call MD for:  persistant dizziness or light-headedness    Complete by:  As directed      Call MD for:  persistant nausea and vomiting    Complete by:  As directed      Call MD for:  temperature >100.4    Complete by:  As directed      Diet Carb Modified    Complete by:  As directed          You were hospitalized due to your diabetes. Diabetic ketoacidosis (DKA) occurs when your body's insulin levels get too low. It is important for you to continue to maintain a small about of insulin in your body at all times. This is called "basal insulin." In addition to this, you will need additional insulin during the day to help keep your blood sugar levels  under control.  We have given you insulin and hydration through your IV while you were in the hospital. It will be important for you to continue your insulin regimen as prescribed. We will schedule you with follow up next week where a physician will continue to monitor and improve your insulin regimen. Please keep a log of your blood sugar readings to take to your doctor's appointment.  When you return home, you will need to begin taking your insulin differently than before: 1) Take 20 units of Novalin 70/30 insulin two times every day before breakfast and dinner. 2) Check you blood sugar levels three times every day 1-2 hours after eating. If your blood sugar levels are elevated > 200, you can increase your next dose of insulin by 1 unit. Please give this change 2 days, before increasing your dose again. 3) Remember to continue to take your insulin every day, even if your blood sugar is <100,  you can drink some sugary juice or candy with your insulin to avoid bringing your sugars too low. 4) If your blood sugar level is < 70. You should eat something sugary and recheck your blood sugar in 1-2 hours before taking your insulin. Please contact your doctor to let them know.  Consultations:   Diabetes Coordinator, Care Management  Procedures Performed:  Dg Chest 2 View  05/03/2015   CLINICAL DATA:  Chest pain.  EXAM: CHEST  2 VIEW  COMPARISON:  12/21/2014 and 11/22/2011  FINDINGS: The heart size and mediastinal contours are within normal limits. Both lungs are clear. The visualized skeletal structures are unremarkable.  IMPRESSION: No active cardiopulmonary disease.   Electronically Signed   By: Francene Boyers M.D.   On: 05/03/2015 08:44   Admission HPI:  34 y/o Fwith PMH of t1DM with medication non compliance hx, depression, tobacco abuse, GERD. Presented today with complaints of chest pain that started yesterday at about 2pm, when she was sleeping, pain woke her up from sleep. She describes the pain as shooting and sharp, located on the left side of her chest. Rates pain as a 20/10. Pain lasted all day. Pain is aggravated by deep breathing. There was no associated SOB with Chest pain, but no diaphoresis, nausea or vomiting. She says pain went down her left arm causing tingling and made her chronic back pain feel worse. She endorses having similar chest pain about 8 months ago when she was admitted here, but per chart that was February 2015. She smokes- about 6-7 cigs per day, for the past 16 yrs.   She denies recent travel, hx of blood clots, denies use frequent use of NSAIDs, takes goody powders occasionally, denies dark stools.   She says she has had a poor appetite over the past 2 days, last meal was 2 days ago, she says just has not had an appetite, She denies vomiting, diarrhea or abdominal pain. She has been taking her 70/30- 20u twice a day, despite not eating- ( She was prescribed  this med for 3 times a day), and took her last dose this morning before coming to the ED.  She endorses been dizzy at baseline, and has to stand for a while after getting up from sitting position, so dizziness passes before she can move. She says she fell yesterday, bruising her knees, was out for about a second, and came to once she was on the floor. She has been able to bear weight on her legs without increased pain. She had no preceeding dizziness,  lightheadedness before she fell, which was 2 hrs after her chest pain started. She denies polyuria, dysuria, cough or SOB, no fever or chills.   In the Ed she was found to have an Anion gap of 25, Bicarb of 10, Venous blood gas with pH 7.149, and CBG of 294.  Hospital Course by problem list:  DKA Ms. Allison was admitted with complaints of atypical chest pain in the setting of underlying ketoacidosis and dehydration. On admission, she had low bicarb of 10 (which was resolving by discharge), anion gap of 25 (which was closed by discharge), urinalysis with glucose and ketones. Her admission blood glucose was 294. She was treated with IV fluids and insulin drip which improved her laboratory markers. It is likely that her low presenting BG was do to poor oral intake combined with improper insulin use over the last several days. This is her third hospital admission this year for DKA, and the diabetes educator was consulted to evaluate methods to improve compliance and blood sugar control.  At the time of discharge, her appetite was normal and she was feeling back to her baseline.  She will follow-up with her PCP next week.  If her DM remains difficult to control she may benefit from endocrinology referral.  Atypical Chest-pain She presented with complaints of atypical chest pain. She ruled out for ACS with two cycles of negative cardiac enzymes and two ECGs unremarkable for ishemia. She had a prior admission in 2015 with similar pain, at which time she  received an echo-cardiogram and stress-test without signs of ischemic damage or reduced cardiac function. On this admission, her symptoms quickly resolved with supportive therapy of IV fluids and treatment of her DKA. Her chest pain may be related to dehydration. We are confident that her chest-pain is non-ischemic, however, we have not elucidated a specific etiology. We recommend continued work up in the outpatient setting if symptoms recur.  Hypocalcemia Incidentally, she was found to have hypocalcemia with a total calcium of 7.4. This was interrogated with an albumin level which demonstrated slightly decreased albumin at 2.8. Correction for hypoalbuminemia still demonstrates low calcium levels  (~8.0). This value is of unclear clinical significance given her resolving metabolic acidosis which can decrease the calcium-albumin binding constant. As a result, we would expect this to artificially increase physiologic ionized-calcium measurement due to poor Ca-albumin binding, which may mask clinical manifestations of hypocalcemia. Regardless, on exam she denied trismus or Trousseau's signs.  Given her DM type1, hypocalcemia and low bloo pressures, we initiated a work-up for polyglandular autoimmune syndrome with an AM-cortisol which was unremarkable, and a PTH which was still pending at the time of discharge. We recommend follow up of PTH and continued monitoring of calcium in the outpatient setting.  Discharge Vitals:   BP 115/78 mmHg  Pulse 71  Temp(Src) 98.4 F (36.9 C) (Oral)  Resp 20  Ht 5\' 4"  (1.626 m)  Wt 57.607 kg (127 lb)  BMI 21.79 kg/m2  SpO2 99%  Discharge Labs:  Results for orders placed or performed during the hospital encounter of 05/03/15 (from the past 24 hour(s))  Glucose, capillary     Status: Abnormal   Collection Time: 05/04/15 11:42 AM  Result Value Ref Range   Glucose-Capillary 183 (H) 65 - 99 mg/dL  Glucose, capillary     Status: Abnormal   Collection Time: 05/04/15  4:46  PM  Result Value Ref Range   Glucose-Capillary 407 (H) 65 - 99 mg/dL  Glucose, capillary  Status: Abnormal   Collection Time: 05/04/15  7:44 PM  Result Value Ref Range   Glucose-Capillary 376 (H) 65 - 99 mg/dL  Glucose, capillary     Status: Abnormal   Collection Time: 05/04/15  9:58 PM  Result Value Ref Range   Glucose-Capillary 425 (H) 65 - 99 mg/dL   Comment 1 Notify RN   Glucose, capillary     Status: Abnormal   Collection Time: 05/04/15 11:30 PM  Result Value Ref Range   Glucose-Capillary 368 (H) 65 - 99 mg/dL  Cortisol-am, blood     Status: None   Collection Time: 05/05/15  7:57 AM  Result Value Ref Range   Cortisol - AM 8.5 6.7 - 22.6 ug/dL  Albumin     Status: Abnormal   Collection Time: 05/05/15  7:57 AM  Result Value Ref Range   Albumin 2.8 (L) 3.5 - 5.0 g/dL  Glucose, capillary     Status: Abnormal   Collection Time: 05/05/15  7:57 AM  Result Value Ref Range   Glucose-Capillary 120 (H) 65 - 99 mg/dL    Signed: Evelena Peat DO 05/05/2015, 11:07 AM    Services Ordered on Discharge: None needed Equipment Ordered on Discharge: None needed

## 2015-05-05 NOTE — Progress Notes (Signed)
Subjective:    Currently, the patient is feeling well and back to her baseline. Her appetite has returned to normal and she is agreeable to discharge with close follow up for management of her DM.  Interval Events: None   Objective:    Vital Signs:   Temp:  [97.6 F (36.4 C)-98.4 F (36.9 C)] 98.4 F (36.9 C) (08/13 0553) Pulse Rate:  [71-85] 71 (08/13 0553) Resp:  [18-20] 20 (08/13 0553) BP: (96-115)/(47-78) 115/78 mmHg (08/13 0553) SpO2:  [98 %-99 %] 99 % (08/13 0553)    24-hour weight change: Weight change:   Intake/Output:   Intake/Output Summary (Last 24 hours) at 05/05/15 0831 Last data filed at 05/05/15 0500  Gross per 24 hour  Intake    860 ml  Output   1200 ml  Net   -340 ml      Physical Exam: General: Vital signs reviewed and noted. Well-developed, well-nourished, in no acute distress; alert, appropriate and cooperative throughout examination.  Lungs:  Normal respiratory effort. Clear to auscultation BL without crackles or wheezes.  Heart: RRR. S1 and S2 normal without gallop, murmur, or rubs.  Abdomen:  BS normoactive. Soft, Nondistended, non-tender.  No masses or organomegaly.  Extremities: No pretibial edema. Bruising on the anterior aspect of knees bilaterally. TTP. Full ROM.     Labs:  Basic Metabolic Panel:  Recent Labs Lab 05/03/15 1154 05/03/15 1543 05/03/15 2034 05/03/15 2321 05/04/15 0315  NA 135 136 135 134* 134*  K 3.8 3.5 3.4* 3.8 3.7  CL 109 111 109 108 110  CO2 12* 16* 16* 17* 18*  GLUCOSE 231* 206* 179* 213* 319*  BUN <5*  CREATININE 1.16* 0.98 0.72 0.76 0.67  CALCIUM 7.5* 7.5* 7.6* 7.5* 7.4*   CBC:  Recent Labs Lab 05/03/15 0750  WBC 11.1*  NEUTROABS 7.9*  HGB 16.2*  HCT 47.4*  MCV 95.0  PLT 249    Cardiac Enzymes:  Recent Labs Lab 05/03/15 1543 05/03/15 2321 05/04/15 0315  TROPONINI <0.03 <0.03 <0.03    CBG:  Recent Labs Lab 05/04/15 1646 05/04/15 1944 05/04/15 2158 05/04/15 2330  05/05/15 0757  GLUCAP 407* 376* 425* 368* 120*    Imaging: Dg Chest 2 View  05/03/2015   CLINICAL DATA:  Chest pain.  EXAM: CHEST  2 VIEW  COMPARISON:  12/21/2014 and 11/22/2011  FINDINGS: The heart size and mediastinal contours are within normal limits. Both lungs are clear. The visualized skeletal structures are unremarkable.  IMPRESSION: No active cardiopulmonary disease.   Electronically Signed   By: Francene Boyers M.D.   On: 05/03/2015 08:44       Medications:    Infusions:    Scheduled Medications: . aspirin EC  81 mg Oral Daily  . enoxaparin (LOVENOX) injection  40 mg Subcutaneous Q24H  . insulin aspart  0-15 Units Subcutaneous TID WC  . insulin aspart  0-5 Units Subcutaneous QHS  . insulin aspart protamine- aspart  20 Units Subcutaneous BID WC  . sodium chloride  3 mL Intravenous Q12H    PRN Medications: albuterol, oxyCODONE-acetaminophen   Assessment/ Plan:    Pt is a 34 y.o. yo female with a PMHx of t1?DM (poor medication compliance), depression, and tobacco abuse  who was admitted on 05/03/2015 with symptoms of atypical chest pain, which was determined to be non-ischemic by serial CE and ECG. She was noted to be in DKA with an AG acidosis, hyperglycemia and dehydration at the time of presentation. Interventions  at this time will be focused on continued hydration and w/u of possible autoimmune etiologies for DKA.   DKA - Presented with low Bicarb (10, now resolving at 18), Anion gap (25, now closed at 6), UA with glc/ketones. BG was low (294) on admission, likely do to poor PO intake and ongoing insulin use prior to admit. 3rd hospital admission this year for DKA. Hx of non compliance per chart. - d/c tele - d/c IVF - plan to DC on 15u basal + 3u TID qAC + sensative SSI per DM coordinator recs - outpt followup for continued management and titration of insulins  Anion Gap metabolic acidosis - Resolved. 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 Baseline HCO3 appears to be 20-21  Hypocalcemia - Ca 7.5. No clear etiology. Denies trismus or Trousseau's sign. Will investigate polyglandular autoimmune syndrome, f/u PTH, AM cortisol, and albumin.  Diabetes - Home meds: Novalog 70/30 20u TID. Uncertain why patient is dosed TID, appreciate diabetes coordinator recs (see note from 05/04/2015). - insulin as above  Depression - she was prescribed Trazodone for sleep, she has not been taking this med. She reports difficulty sleeping and was given trazodone last night. This likely contributed to her hypotension. Will hold trazodone and plan to d/c at discharge - d/c trazodone due to hypotension, consider ambien for sleep at DC  Chest pain - Resolved. EKG and trops unremarkable. D-dimer neg. Admission in  Feb 2015 for atypical chest pain with negative ischemic w/u including stress and echo (EF 50-55%).  Fall - Likely due to dehydration. BP appears to be low- 90s- low 100s systolic.   FEN/GI -  - PO hydration - carb restrict diet   DVT PPX - low molecular weight heparin  CODE STATUS - Full  CONSULTS PLACED - None  DISPO - Discharge today.  Anticipated discharge in approximately 0 day(s).   The patient does have a current PCP (Janit Pagan, MD) and does need an [facility] hospital follow-up appointment after discharge.    Is the [facility] hospital follow-up appointment a one-time only appointment? no.  Does the patient have transportation limitations that hinder transportation to clinic appointments? no   SERVICE NEEDED AT DISCHARGE - TO BE DETERMINED DURING HOSPITAL COURSE         Y = Yes, Blank = No PT:   OT:   RN:   Equipment:   Other:      Length of Stay: 2 day(s)   This is a Psychologist, occupational Note.  The care of the patient was discussed with Dr. Andrey Campanile and the assessment and plan formulated with their assistance.  Please see their attached note for official documentation of the daily encounter.  Burna Cash, MS4 Pager: [phone] (7AM-5PM) 05/05/2015, 8:31 AM

## 2015-05-05 NOTE — Discharge Instructions (Addendum)
You were hospitalized due to your diabetes. Diabetic ketoacidosis (DKA) occurs when your body's insulin levels get too low. It is important for you to continue to maintain a small about of insulin in your body at all times. This is called "basal insulin." In addition to this, you will need additional insulin during the day to help keep your blood sugar levels under control.  We have given you insulin and hydration through your IV while you were in the hospital. It will be important for you to continue your insulin regimen as prescribed. We will schedule you with follow up next week where a physician will continue to monitor and improve your insulin regimen. Please keep a log of your blood sugar readings to take to your doctor's appointment.  When you return home, you will need to begin taking your insulin differently than before: 1) Take 20 units of Novalin 70/30 insulin two times every day before breakfast and dinner. 2) Check you blood sugar levels three times every day 1-2 hours after eating. If your blood sugar levels are elevated > 200, you can increase your next dose of insulin by 1 unit. Please give this change 2 days, before increasing your dose again. 3) Remember to continue to take your insulin every day, even if your blood sugar is <100, you can drink some sugary juice or candy with your insulin to avoid bringing your sugars too low. 4) If your blood sugar level is < 70. You should eat something sugary and recheck your blood sugar in 1-2 hours before taking your insulin. Please contact your doctor to let them know.   Diabetic Ketoacidosis Diabetic ketoacidosis (DKA) is a life-threatening complication of type 1 diabetes. It must be quickly recognized and treated. Treatment requires hospitalization. CAUSES  When there is no insulin in the body, glucose (sugar) cannot be used, and the body breaks down fat for energy. When fat breaks down, acids (ketones) build up in the blood. Very high levels  of glucose and high levels of acids lead to severe loss of body fluids (dehydration) and other dangerous chemical changes. This stresses your vital organs and can cause coma or death. SIGNS AND SYMPTOMS   Tiredness (fatigue).  Weight loss.  Excessive thirst.  Ketones in your urine.  Light-headedness.  Fruity or sweet smelling breath.  Excessive urination.  Visual changes.  Confusion or irritability.  Nausea or vomiting.  Rapid breathing.  Stomachache or abdominal pain. DIAGNOSIS  Your health care provider will diagnose DKA based on your history, physical exam, and blood tests. The health care provider will check to see if you have another illness that caused you to go into DKA. Most of this will be done quickly in an emergency room. TREATMENT   Fluid replacement to correct dehydration.  Insulin.  Correction of electrolytes, such as potassium and sodium.  Antibiotic medicines. PREVENTION  Always take your insulin. Do not skip your insulin injections.  If you are sick, treat yourself quickly. Your body often needs more insulin to fight the illness.  Check your blood glucose regularly.  Check urine ketones if your blood glucose is greater than 240 milligrams per deciliter (mg/dL).  Do not use outdated (expired) insulin.  If your blood glucose is high, drink plenty of fluids. This helps flush out ketones. HOME CARE INSTRUCTIONS   If you are sick, follow the advice of your health care provider.  To prevent dehydration, drink enough water and fluids to keep your urine clear or pale yellow.  If you cannot eat, alternate between drinking fluids with sugar (soda, juices, flavored gelatin) and salty fluids (broth, bouillon).  If you can eat, follow your usual diet and drink sugar-free liquids (water, diet drinks).  Always take your usual dose of insulin. If you cannot eat or if your glucose is getting too low, call your health care provider for further  instructions.  Continue to monitor your blood or urine ketones every 3-4 hours around the clock. Set your alarm clock or have someone wake you up. If you are too sick, have someone test it for you.  Rest and avoid exercise. SEEK MEDICAL CARE IF:   You have a fever.  You have ketones in your urine, or your blood glucose is higher than a level your health care provider suggests. You may need extra insulin. Call your health care provider if you need advice on adjusting your insulin.  You cannot drink at least a tablespoon (15 mL) of fluid every 15-20 minutes.  You have been vomiting for more than 2 hours.  You have symptoms of DKA:  Fruity smelling breath.  Breathing faster or slower.  Becoming very sleepy. SEEK IMMEDIATE MEDICAL CARE IF:   You have signs of dehydration:  Decreased urination.  Increased thirst.  Dry skin and mouth.  Light-headedness.  Your blood glucose is very high (as advised by your health care provider) twice in a row.  You faint.  You have chest pain or trouble breathing.  You have a sudden, severe headache.  You have sudden weakness in one arm or one leg.  You have sudden trouble speaking or swallowing.  You have vomiting or diarrhea that is getting worse after 3 hours.  You have abdominal pain. MAKE SURE YOU:   Understand these instructions.  Will watch your condition.  Will get help right away if you are not doing well or get worse. Document Released: 09/05/2000 Document Revised: 09/13/2013 Document Reviewed: 03/14/2009 [facility] Patient Information 2015 [employer], [place]. This information is not intended to replace advice given to you by your health care provider. Make sure you discuss any questions you have with your health care provider.

## 2015-05-06 LAB — PARATHYROID HORMONE, INTACT (NO CA): PTH: 34 pg/mL (ref 15–65)

## 2015-07-25 ENCOUNTER — Inpatient Hospital Stay (HOSPITAL_COMMUNITY): Payer: Self-pay

## 2015-07-25 ENCOUNTER — Encounter (HOSPITAL_COMMUNITY): Payer: Self-pay | Admitting: Emergency Medicine

## 2015-07-25 ENCOUNTER — Inpatient Hospital Stay (HOSPITAL_COMMUNITY)
Admission: EM | Admit: 2015-07-25 | Discharge: 2015-07-27 | DRG: 638 | Disposition: A | Discharge status: Home / Self Care | Payer: Self-pay | Attending: Internal Medicine | Admitting: Internal Medicine

## 2015-07-25 DIAGNOSIS — F17210 Nicotine dependence, cigarettes, uncomplicated: Secondary | ICD-10-CM | POA: Diagnosis present

## 2015-07-25 DIAGNOSIS — Z79899 Other long term (current) drug therapy: Secondary | ICD-10-CM

## 2015-07-25 DIAGNOSIS — J45909 Unspecified asthma, uncomplicated: Secondary | ICD-10-CM | POA: Diagnosis present

## 2015-07-25 DIAGNOSIS — E1010 Type 1 diabetes mellitus with ketoacidosis without coma: Principal | ICD-10-CM | POA: Diagnosis present

## 2015-07-25 DIAGNOSIS — N390 Urinary tract infection, site not specified: Secondary | ICD-10-CM | POA: Diagnosis present

## 2015-07-25 DIAGNOSIS — N179 Acute kidney failure, unspecified: Secondary | ICD-10-CM | POA: Diagnosis present

## 2015-07-25 DIAGNOSIS — I959 Hypotension, unspecified: Secondary | ICD-10-CM | POA: Diagnosis present

## 2015-07-25 DIAGNOSIS — E860 Dehydration: Secondary | ICD-10-CM | POA: Diagnosis present

## 2015-07-25 DIAGNOSIS — F17200 Nicotine dependence, unspecified, uncomplicated: Secondary | ICD-10-CM | POA: Diagnosis present

## 2015-07-25 DIAGNOSIS — Z9114 Patient's other noncompliance with medication regimen: Secondary | ICD-10-CM

## 2015-07-25 DIAGNOSIS — Z7982 Long term (current) use of aspirin: Secondary | ICD-10-CM

## 2015-07-25 DIAGNOSIS — N3000 Acute cystitis without hematuria: Secondary | ICD-10-CM

## 2015-07-25 DIAGNOSIS — F419 Anxiety disorder, unspecified: Secondary | ICD-10-CM | POA: Diagnosis present

## 2015-07-25 DIAGNOSIS — Z794 Long term (current) use of insulin: Secondary | ICD-10-CM

## 2015-07-25 LAB — BASIC METABOLIC PANEL
ANION GAP: 25 — AB (ref 5–15)
ANION GAP: 15 (ref 5–15)
BUN: 26 mg/dL — AB (ref 6–20)
BUN: 19 mg/dL (ref 6–20)
CHLORIDE: 107 mmol/L (ref 101–111)
CHLORIDE: 110 mmol/L (ref 101–111)
CO2: 7 mmol/L — AB (ref 22–32)
CO2: 10 mmol/L — ABNORMAL LOW (ref 22–32)
Calcium: 7.9 mg/dL — ABNORMAL LOW (ref 8.9–10.3)
Calcium: 7.8 mg/dL — ABNORMAL LOW (ref 8.9–10.3)
Creatinine, Ser: 1.08 mg/dL — ABNORMAL HIGH (ref 0.44–1.00)
Creatinine, Ser: 0.94 mg/dL (ref 0.44–1.00)
GFR calc Af Amer: >60 mL/min (ref >60)
GFR calc Af Amer: >60 mL/min (ref >60)
GLUCOSE: 394 mg/dL — AB (ref 65–99)
GLUCOSE: 202 mg/dL — AB (ref 65–99)
POTASSIUM: 4.8 mmol/L (ref 3.5–5.1)
POTASSIUM: 4.8 mmol/L (ref 3.5–5.1)
Sodium: 139 mmol/L (ref 135–145)
Sodium: 135 mmol/L (ref 135–145)

## 2015-07-25 LAB — CBG MONITORING, ED
Glucose-Capillary: >600 mg/dL (ref 65–99)
Glucose-Capillary: 386 mg/dL — ABNORMAL HIGH (ref 65–99)

## 2015-07-25 LAB — GLUCOSE, CAPILLARY
GLUCOSE-CAPILLARY: 378 mg/dL — AB (ref 65–99)
Glucose-Capillary: 293 mg/dL — ABNORMAL HIGH (ref 65–99)

## 2015-07-25 LAB — BLOOD GAS, ARTERIAL
ACID-BASE DEFICIT: 26.8 mmol/L — AB (ref 0.0–2.0)
BICARBONATE: 3.7 meq/L — AB (ref 20.0–24.0)
Drawn by: 257701
FIO2: 0.21
O2 Saturation: 96.8 %
PATIENT TEMPERATURE: 98.6
PCO2 ART: 14.1 mmHg — AB (ref 35.0–45.0)
PO2 ART: 132 mmHg — AB (ref 80.0–100.0)
TCO2: 3.6 mmol/L (ref 0–100)
pH, Arterial: 7.044 — CL (ref 7.350–7.450)

## 2015-07-25 LAB — URINALYSIS, ROUTINE W REFLEX MICROSCOPIC
Bilirubin Urine: NEGATIVE
Glucose, UA: >1000 mg/dL — AB
Ketones, ur: >80 mg/dL — AB
Nitrite: NEGATIVE
Protein, ur: NEGATIVE mg/dL
Specific Gravity, Urine: 1.024 (ref 1.005–1.030)
Urobilinogen, UA: 0.2 mg/dL (ref 0.0–1.0)
pH: 5 (ref 5.0–8.0)

## 2015-07-25 LAB — COMPREHENSIVE METABOLIC PANEL
ALT: 24 U/L (ref 14–54)
AST: 23 U/L (ref 15–41)
Albumin: 4.3 g/dL (ref 3.5–5.0)
Alkaline Phosphatase: 105 U/L (ref 38–126)
BUN: 32 mg/dL — ABNORMAL HIGH (ref 6–20)
CO2: <7 mmol/L — ABNORMAL LOW (ref 22–32)
Calcium: 8.8 mg/dL — ABNORMAL LOW (ref 8.9–10.3)
Chloride: 91 mmol/L — ABNORMAL LOW (ref 101–111)
Creatinine, Ser: 1.49 mg/dL — ABNORMAL HIGH (ref 0.44–1.00)
GFR calc Af Amer: 53 mL/min — ABNORMAL LOW (ref >60)
GFR calc non Af Amer: 46 mL/min — ABNORMAL LOW (ref >60)
Glucose, Bld: 655 mg/dL (ref 65–99)
Potassium: 5.2 mmol/L — ABNORMAL HIGH (ref 3.5–5.1)
Sodium: 133 mmol/L — ABNORMAL LOW (ref 135–145)
Total Bilirubin: 1.4 mg/dL — ABNORMAL HIGH (ref 0.3–1.2)
Total Protein: 7.3 g/dL (ref 6.5–8.1)

## 2015-07-25 LAB — URINE MICROSCOPIC-ADD ON

## 2015-07-25 LAB — CBC
HEMATOCRIT: 47.7 % — AB (ref 36.0–46.0)
HEMOGLOBIN: 15.2 g/dL — AB (ref 12.0–15.0)
MCH: 32.5 pg (ref 26.0–34.0)
MCHC: 31.9 g/dL (ref 30.0–36.0)
MCV: 102.1 fL — AB (ref 78.0–100.0)
Platelets: 382 10*3/uL (ref 150–400)
RBC: 4.67 MIL/uL (ref 3.87–5.11)
RDW: 14.1 % (ref 11.5–15.5)
WBC: 23 10*3/uL — AB (ref 4.0–10.5)

## 2015-07-25 LAB — MRSA PCR SCREENING: MRSA BY PCR: NEGATIVE

## 2015-07-25 LAB — MAGNESIUM: MAGNESIUM: 2.1 mg/dL (ref 1.7–2.4)

## 2015-07-25 MED ORDER — ENOXAPARIN SODIUM 40 MG/0.4ML ~~LOC~~ SOLN
40.0000 mg | SUBCUTANEOUS | Status: DC
  Administered 2015-07-25 – 2015-07-26 (×2): 40 mg via SUBCUTANEOUS
  Filled 2015-07-25 (×2): qty 0.4

## 2015-07-25 MED ORDER — ONDANSETRON HCL 4 MG/2ML IJ SOLN: 4.0000 mg | Freq: Four times a day (QID) | INTRAMUSCULAR | Status: DC | PRN

## 2015-07-25 MED ORDER — DEXTROSE-NACL 5-0.45 % IV SOLN
INTRAVENOUS | Status: DC
  Administered 2015-07-25: 21:00:00 via INTRAVENOUS

## 2015-07-25 MED ORDER — ACETAMINOPHEN 650 MG RE SUPP: 650.0000 mg | Freq: Four times a day (QID) | RECTAL | Status: DC | PRN

## 2015-07-25 MED ORDER — ACETAMINOPHEN 325 MG PO TABS
650.0000 mg | ORAL_TABLET | Freq: Four times a day (QID) | ORAL | Status: DC | PRN
  Administered 2015-07-26: 650 mg via ORAL
  Filled 2015-07-25: qty 2

## 2015-07-25 MED ORDER — ONDANSETRON HCL 4 MG PO TABS: 4.0000 mg | ORAL_TABLET | Freq: Four times a day (QID) | ORAL | Status: DC | PRN

## 2015-07-25 MED ORDER — CETYLPYRIDINIUM CHLORIDE 0.05 % MT LIQD
7.0000 mL | Freq: Two times a day (BID) | OROMUCOSAL | Status: DC
  Administered 2015-07-26: 7 mL via OROMUCOSAL

## 2015-07-25 MED ORDER — MORPHINE SULFATE (PF) 4 MG/ML IV SOLN
4.0000 mg | INTRAVENOUS | Status: DC | PRN
  Administered 2015-07-25 (×2): 4 mg via INTRAVENOUS
  Filled 2015-07-25 (×2): qty 1

## 2015-07-25 MED ORDER — CHLORHEXIDINE GLUCONATE 0.12 % MT SOLN
15.0000 mL | Freq: Two times a day (BID) | OROMUCOSAL | Status: DC
  Administered 2015-07-26: 15 mL via OROMUCOSAL
  Filled 2015-07-25 (×2): qty 15

## 2015-07-25 MED ORDER — DEXTROSE 5 % IV SOLN
1.0000 g | Freq: Once | INTRAVENOUS | Status: AC
  Administered 2015-07-25: 1 g via INTRAVENOUS
  Filled 2015-07-25: qty 10

## 2015-07-25 MED ORDER — SODIUM CHLORIDE 0.9 % IV SOLN
INTRAVENOUS | Status: DC
  Administered 2015-07-25: 5.4 [IU]/h via INTRAVENOUS
  Filled 2015-07-25: qty 2.5

## 2015-07-25 MED ORDER — SODIUM CHLORIDE 0.9 % IV SOLN
INTRAVENOUS | Status: AC
  Administered 2015-07-25: 18:00:00 via INTRAVENOUS

## 2015-07-25 MED ORDER — SODIUM CHLORIDE 0.9 % IV SOLN: INTRAVENOUS | Status: DC

## 2015-07-25 MED ORDER — ALBUTEROL SULFATE (2.5 MG/3ML) 0.083% IN NEBU: 3.0000 mL | INHALATION_SOLUTION | Freq: Four times a day (QID) | RESPIRATORY_TRACT | Status: DC | PRN

## 2015-07-25 MED ORDER — DEXTROSE 5 % IV SOLN
1.0000 g | INTRAVENOUS | Status: DC
  Administered 2015-07-26: 1 g via INTRAVENOUS
  Filled 2015-07-25: qty 10

## 2015-07-25 MED ORDER — ONDANSETRON HCL 4 MG/2ML IJ SOLN
4.0000 mg | Freq: Once | INTRAMUSCULAR | Status: AC
Start: 2015-07-25 — End: 2015-07-25
  Administered 2015-07-25: 4 mg via INTRAVENOUS
  Filled 2015-07-25: qty 2

## 2015-07-25 MED ORDER — SODIUM CHLORIDE 0.9 % IV BOLUS (SEPSIS)
1000.0000 mL | Freq: Once | INTRAVENOUS | Status: AC
  Administered 2015-07-25: 1000 mL via INTRAVENOUS

## 2015-07-25 NOTE — ED Notes (Signed)
Pt transported to 1235 via stretcher, remains on monitor, insulin, NS bolus, & rocephin infusing.

## 2015-07-25 NOTE — ED Notes (Signed)
Bed: WA01 Expected date:  Expected time:  Means of arrival:  Comments: EMS- hyperglycemia 

## 2015-07-25 NOTE — ED Provider Notes (Signed)
CSN: [id number]     Arrival date & time 07/25/15  1408 History   First MD Initiated Contact with Patient 07/25/15 1432     Chief Complaint  Patient presents with  . Hyperglycemia    Level V caveat: Uncooperative   HPI She is a diabetic and EMS reports the patient is noncompliant with her medications.  Patient states she's been taking her insulin as she is supposed to.  She presents with elevated blood sugars and is complaining of back pain and polyuria without dysuria.  Denies vomiting.  Reports nausea.  Denies diarrhea. she would not provide any additional history at this time.     Past Medical History  Diagnosis Date  . Asthma   . Diabetes mellitus without complication (HCC)   . Headache(784.0)   . Anxiety    Past Surgical History  Procedure Laterality Date  . No past surgeries     Family History  Problem Relation Age of Onset  . Heart disease      No family history   Social History  Substance Use Topics  . Smoking status: Current Every Day Smoker -- 0.25 packs/day for 20 years    Types: Cigarettes  . Smokeless tobacco: Never Used  . Alcohol Use: No   OB History    No data available     Review of Systems  Unable to perform ROS: Other      Allergies  Latex  Home Medications   Prior to Admission medications   Medication Sig Start Date End Date Taking? Authorizing Provider  albuterol (PROVENTIL HFA;VENTOLIN HFA) 108 (90 BASE) MCG/ACT inhaler Inhale 2 puffs into the lungs every 6 (six) hours as needed for wheezing or shortness of breath. 02/11/15   Renee A Kuneff, DO  aspirin EC 81 MG EC tablet Take 1 tablet (81 mg total) by mouth daily. 11/20/13   Nani RavensAndrew M Wight, MD  ibuprofen (ADVIL,MOTRIN) 800 MG tablet Take 1 tablet (800 mg total) by mouth every 8 (eight) hours as needed (knee pain). 05/05/15   Yolanda MangesAlex M Wilson, DO  insulin NPH-regular Human (NOVOLIN 70/30) (70-30) 100 UNIT/ML injection Inject 20 Units into the skin 2 (two) times daily with a meal. 05/05/15   Yolanda MangesAlex M  Wilson, DO  Lancets [facility](ONETOUCH ULTRASOFT) lancets Use as instructed 09/08/14   Doreene ElandKehinde T Eniola, MD  traZODone (DESYREL) 50 MG tablet Take 1 tablet (50 mg total) by mouth 2 (two) times daily. Patient not taking: Reported on 02/09/2015 10/31/14   Doreene ElandKehinde T Eniola, MD   BP 133/69 mmHg  Pulse 128  Temp(Src) 97.3 F (36.3 C) (Rectal)  Resp 22  Ht 5\' 4"  (1.626 m)  Wt 125 lb (56.7 kg)  BMI 21.45 kg/m2  SpO2 100% Physical Exam  Constitutional: She is oriented to person, place, and time. She appears well-developed and well-nourished. No distress.  HENT:  Head: Normocephalic and atraumatic.  Dry mucous membranes  Eyes: EOM are normal.  Neck: Normal range of motion.  Cardiovascular: Normal heart sounds.   Tachycardia  Pulmonary/Chest: Effort normal and breath sounds normal.  Abdominal: Soft. She exhibits no distension. There is no tenderness.  Musculoskeletal: Normal range of motion.  Neurological: She is alert and oriented to person, place, and time.  Skin: Skin is warm and dry.  Psychiatric: She has a normal mood and affect. Judgment normal.  Nursing note and vitals reviewed.   ED Course  Procedures (including critical care time)  CRITICAL CARE Performed by: Lyanne CoAMPOS,Christyl Osentoski M Total critical care time: 35  minutes Critical care time was exclusive of separately billable procedures and treating other patients. Critical care was necessary to treat or prevent imminent or life-threatening deterioration. Critical care was time spent personally by me on the following activities: development of treatment plan with patient and/or surrogate as well as nursing, discussions with consultants, evaluation of patient's response to treatment, examination of patient, obtaining history from patient or surrogate, ordering and performing treatments and interventions, ordering and review of laboratory studies, ordering and review of radiographic studies, pulse oximetry and re-evaluation of patient's  condition.   Labs Review Labs Reviewed  CBC - Abnormal; Notable for the following:    WBC 23.0 (*)    Hemoglobin 15.2 (*)    HCT 47.7 (*)    MCV 102.1 (*)    All other components within normal limits  URINALYSIS, ROUTINE W REFLEX MICROSCOPIC (NOT AT [facility]) - Abnormal; Notable for the following:    APPearance CLOUDY (*)    Glucose, UA >1000 (*)    Hgb urine dipstick TRACE (*)    Ketones, ur >80 (*)    Leukocytes, UA MODERATE (*)    All other components within normal limits  COMPREHENSIVE METABOLIC PANEL - Abnormal; Notable for the following:    Sodium 133 (*)    Potassium 5.2 (*)    Chloride 91 (*)    CO2 <7 (*)    Glucose, Bld 655 (*)    BUN 32 (*)    Creatinine, Ser 1.49 (*)    Calcium 8.8 (*)    Total Bilirubin 1.4 (*)    GFR calc non Af Amer 46 (*)    GFR calc Af Amer 53 (*)    All other components within normal limits  URINE MICROSCOPIC-ADD ON - Abnormal; Notable for the following:    Squamous Epithelial / LPF FEW (*)    All other components within normal limits  BLOOD GAS, ARTERIAL - Abnormal; Notable for the following:    pH, Arterial 7.044 (*)    pCO2 arterial 14.1 (*)    pO2, Arterial 132 (*)    Bicarbonate 3.7 (*)    Acid-base deficit 26.8 (*)    All other components within normal limits  CBG MONITORING, ED - Abnormal; Notable for the following:    Glucose-Capillary >600 (*)    All other components within normal limits  URINE CULTURE    Imaging Review No results found. I have personally reviewed and evaluated these images and lab results as part of my medical decision-making.655    EKG Interpretation None      MDM   Final diagnoses:  Diabetic ketoacidosis without coma associated with type 1 diabetes mellitus (HCC)  Acute cystitis without hematuria    Patient presents with what appears to be diabetic ketoacidosis with a blood sugar of 655 and anion gap greater than 30.  PH is 7.04.  Patient be admitted to stepdown unit.  IV resuscitation with IV  fluids at this time.  Start on insulin drip.  2150 white blood cells.  Patient will be started on Rocephin for possible urinary tract infection.  Urine culture sent.  No respiratory symptoms    Azalia Bilis, MD 07/25/15 [phone]

## 2015-07-25 NOTE — ED Notes (Signed)
Pt placed on bedpan, linens changed d/t bedpan overflowing.

## 2015-07-25 NOTE — H&P (Addendum)
[facility] History and Physical  MARYKATHRYN CARBONI ZOX:[id number] DOB: [birth year] DOA: 07/25/2015  Referring physician: EDP PCP: Althia Forts, supposed to FU at [facility]  Chief Complaint: high blood sugars, sick  HPI: Kristine Allison is a 34 y.o. female with history of type 1 diabetes, history of noncompliance, recurrent admissions for diabetic ketoacidosis presents to the ER with the above complaints. She is supposed to be on insulin 70/30, 20 units 3 times a day, she reports being out of her medicines for the last 3-4 days, and was due to get paid today to by her insulin. In the interim she's been sick with nausea, vomiting, back pain, for 2-3 days. Her glucometer read as too high, subsequently EMS was called and she was brought to the emergency room, last blood glucose was 571. She was found to be in severe diabetic ketoacidosis pH of 7.04 PCO2 14, bicarbonate less than 7 sodium 133 and creatinine 1.49. WBC is elevated at 23K, denies any fevers or chills denies any cough congestion, no dysuria or urinary frequency. UA remarkable for 21-50 WBCs moderate leukocyte esterase     Review of Systems: Positives bolded Constitutional:  No weight loss, night sweats, Fevers, chills, fatigue.  HEENT:  No headaches, Difficulty swallowing,Tooth/dental problems,Sore throat,  No sneezing, itching, ear ache, nasal congestion, post nasal drip,  Cardio-vascular:  No chest pain, Orthopnea, PND, swelling in lower extremities, anasarca, dizziness, palpitations  GI:  No heartburn, indigestion, abdominal pain, nausea, vomiting, diarrhea, change in bowel habits, loss of appetite  Resp:  No shortness of breath with exertion or at rest. No excess mucus, no productive cough, No non-productive cough, No coughing up of blood.No change in color of mucus.No wheezing.No chest wall deformity  Skin:  no rash or lesions.  GU:  no dysuria, change in color of urine, no urgency or frequency. No  flank pain.  Musculoskeletal:  No joint pain or swelling. No decreased range of motion. No back pain.  Psych:  No change in mood or affect. No depression or anxiety. No memory loss.   Past Medical History  Diagnosis Date  . Asthma   . Diabetes mellitus without complication ([place])   . Headache(784.0)   . Anxiety    Past Surgical History  Procedure Laterality Date  . No past surgeries     Social History:  reports that she has been smoking Cigarettes.  She has a 5 pack-year smoking history. She has never used smokeless tobacco. She reports that she does not drink alcohol or use illicit drugs.  Allergies  Allergen Reactions  . Latex Rash    Family History  Problem Relation Age of Onset  . Heart disease      No family history    Prior to Admission medications   Medication Sig Start Date End Date Taking? Authorizing Provider  albuterol (PROVENTIL HFA;VENTOLIN HFA) 108 (90 BASE) MCG/ACT inhaler Inhale 2 puffs into the lungs every 6 (six) hours as needed for wheezing or shortness of breath. 02/11/15  Yes Renee A Kuneff, DO  insulin NPH-regular Human (NOVOLIN 70/30) (70-30) 100 UNIT/ML injection Inject 20 Units into the skin 2 (two) times daily with a meal. Patient taking differently: Inject 20 Units into the skin 3 (three) times daily.  05/05/15  Yes Francesca Oman, DO  aspirin EC 81 MG EC tablet Take 1 tablet (81 mg total) by mouth daily. Patient not taking: Reported on 07/25/2015 11/20/13   Leone Brand, MD  ibuprofen (ADVIL,MOTRIN) 800 MG tablet Take  1 tablet (800 mg total) by mouth every 8 (eight) hours as needed (knee pain). Patient not taking: Reported on 07/25/2015 05/05/15   Francesca Oman, DO  Lancets [facility] ULTRASOFT) lancets Use as instructed 09/08/14   Kinnie Feil, MD  traZODone (DESYREL) 50 MG tablet Take 1 tablet (50 mg total) by mouth 2 (two) times daily. Patient not taking: Reported on 02/09/2015 10/31/14   Kinnie Feil, MD   Physical Exam: Filed Vitals:    07/25/15 1615 07/25/15 1630 07/25/15 1640 07/25/15 1645  BP: 126/62 127/64 111/62 102/61  Pulse:  126 125 126  Temp:      TempSrc:      Resp: _0 Height:      Weight:      SpO2:  99% 100% 100%    Wt Readings from Last 3 Encounters:  07/25/15 56.7 kg (125 lb)  05/03/15 57.607 kg (127 lb)  02/10/15 57.607 kg (127 lb)    General:  Tired, ill-appearing, no distress, oriented to self place and person Eyes: PERRL, normal lids, irises & conjunctiva ENT: Dry cracked tongue and lips Neck: no LAD, masses or thyromegaly Cardiovascular: RRR, no m/r/g. No LE edema, tachycardic Telemetry: Sinus tachycardia Respiratory: CTA bilaterally, no w/r/r. Normal respiratory effort. Abdomen: soft, ntnd Skin: no rash or induration seen on limited exam Musculoskeletal: grossly normal tone BUE/BLE Psychiatric: grossly normal mood and affect, speech fluent and appropriate Neurologic: grossly non-focal.          Labs on Admission:  Basic Metabolic Panel:  Recent Labs Lab 07/25/15 1445  NA 133*  K 5.2*  CL 91*  CO2 <7*  GLUCOSE 655*  BUN 32*  CREATININE 1.49*  CALCIUM 8.8*   Liver Function Tests:  Recent Labs Lab 07/25/15 1445  AST 23  ALT 24  ALKPHOS 105  BILITOT 1.4*  PROT 7.3  ALBUMIN 4.3   No results for input(s): LIPASE, AMYLASE in the last 168 hours. No results for input(s): AMMONIA in the last 168 hours. CBC:  Recent Labs Lab 07/25/15 1445  WBC 23.0*  HGB 15.2*  HCT 47.7*  MCV 102.1*  PLT 382   Cardiac Enzymes: No results for input(s): CKTOTAL, CKMB, CKMBINDEX, TROPONINI in the last 168 hours.  BNP (last 3 results) No results for input(s): BNP in the last 8760 hours.  ProBNP (last 3 results) No results for input(s): PROBNP in the last 8760 hours.  CBG:  Recent Labs Lab 07/25/15 1421 07/25/15 1639  GLUCAP >600* 386*    Radiological Exams on Admission: Dg Chest Portable 1 View  07/25/2015  CLINICAL DATA:  Hyperglycemia, back pain, polyuria,  nausea, diabetic, noncompliant with medications, asthma, smoker EXAM: PORTABLE CHEST 1 VIEW COMPARISON:  Portable exam 1613 hours compared 05/03/2015 FINDINGS: Normal heart size, mediastinal contours, and pulmonary vascularity. Lungs clear. No pleural effusion or pneumothorax. Numerous EKG leads project over chest. No acute bony abnormalities. IMPRESSION: No acute abnormalities. Electronically Signed   By: Lavonia Dana M.D.   On: 07/25/2015 16:41    EKG: Independently reviewed. Sinus tachycardia no acute ST-T wave ch in the usual anges  Assessment/Plan  1 Severe diabetic ketoacidosis -Admit to stepdown unit, mentation appropriate at this time -Started IV insulin and normal saline, Glucomander protocol -Check B met every 4 hours and magnesium level -Follow-up hemoglobin A1c -Diabetes coordinator consult.   2. AKi -due to dehydration, volume depletion from DKA -Hydrate, monitor  3. Early COPD/ Tobacco use disorder -Stable, nebs when necessary  4. Leukocytosis -  Suspect reactive and partly due to hemoconcentration -UA abnormal will start empiric ceftriaxone follow-up urine cultures -hydrate, monitor  DVT proph: lovenox  Code Status: Full Code DVT Prophylaxis: lovenox Family Communication: none at bedside Disposition Plan: admit to SDU  Time spent: 35mn  Filmore Molyneux [facility] Pager [phone]

## 2015-07-25 NOTE — ED Notes (Signed)
Radiology currently with pt

## 2015-07-25 NOTE — ED Notes (Signed)
Per EMS pt complaint of hyperglycemia; read high; given 500 ml bolus in route; last CBG with EMS 571. Symptoms of back pain and dry mouth. Family reports pt noncompliant with medications.

## 2015-07-26 LAB — BASIC METABOLIC PANEL
Anion gap: 9 (ref 5–15)
Anion gap: 6 (ref 5–15)
Anion gap: 8 (ref 5–15)
Anion gap: 7 (ref 5–15)
BUN: 15 mg/dL (ref 6–20)
BUN: 13 mg/dL (ref 6–20)
BUN: 10 mg/dL (ref 6–20)
BUN: 10 mg/dL (ref 6–20)
CHLORIDE: 109 mmol/L (ref 101–111)
CHLORIDE: 109 mmol/L (ref 101–111)
CO2: 11 mmol/L — ABNORMAL LOW (ref 22–32)
CO2: 18 mmol/L — ABNORMAL LOW (ref 22–32)
CO2: 16 mmol/L — ABNORMAL LOW (ref 22–32)
CO2: 16 mmol/L — AB (ref 22–32)
CREATININE: 0.72 mg/dL (ref 0.44–1.00)
CREATININE: 0.64 mg/dL (ref 0.44–1.00)
CREATININE: 0.55 mg/dL (ref 0.44–1.00)
CREATININE: 0.63 mg/dL (ref 0.44–1.00)
Calcium: 7.4 mg/dL — ABNORMAL LOW (ref 8.9–10.3)
Calcium: 7.3 mg/dL — ABNORMAL LOW (ref 8.9–10.3)
Calcium: 7.2 mg/dL — ABNORMAL LOW (ref 8.9–10.3)
Calcium: 7.5 mg/dL — ABNORMAL LOW (ref 8.9–10.3)
Chloride: 112 mmol/L — ABNORMAL HIGH (ref 101–111)
Chloride: 109 mmol/L (ref 101–111)
GFR calc Af Amer: >60 mL/min (ref >60)
GFR calc Af Amer: >60 mL/min (ref >60)
GFR calc Af Amer: >60 mL/min (ref >60)
GFR calc non Af Amer: >60 mL/min (ref >60)
GFR calc non Af Amer: >60 mL/min (ref >60)
GLUCOSE: 152 mg/dL — AB (ref 65–99)
GLUCOSE: 113 mg/dL — AB (ref 65–99)
GLUCOSE: 109 mg/dL — AB (ref 65–99)
Glucose, Bld: 82 mg/dL (ref 65–99)
POTASSIUM: 3.4 mmol/L — AB (ref 3.5–5.1)
POTASSIUM: 4.2 mmol/L (ref 3.5–5.1)
Potassium: 4.5 mmol/L (ref 3.5–5.1)
Potassium: 3.9 mmol/L (ref 3.5–5.1)
SODIUM: 132 mmol/L — AB (ref 135–145)
SODIUM: 133 mmol/L — AB (ref 135–145)
SODIUM: 133 mmol/L — AB (ref 135–145)
SODIUM: 132 mmol/L — AB (ref 135–145)

## 2015-07-26 LAB — GLUCOSE, CAPILLARY
GLUCOSE-CAPILLARY: 119 mg/dL — AB (ref 65–99)
GLUCOSE-CAPILLARY: 122 mg/dL — AB (ref 65–99)
GLUCOSE-CAPILLARY: 126 mg/dL — AB (ref 65–99)
GLUCOSE-CAPILLARY: 145 mg/dL — AB (ref 65–99)
GLUCOSE-CAPILLARY: 171 mg/dL — AB (ref 65–99)
GLUCOSE-CAPILLARY: 176 mg/dL — AB (ref 65–99)
GLUCOSE-CAPILLARY: 182 mg/dL — AB (ref 65–99)
GLUCOSE-CAPILLARY: 282 mg/dL — AB (ref 65–99)
GLUCOSE-CAPILLARY: 98 mg/dL (ref 65–99)
Glucose-Capillary: 107 mg/dL — ABNORMAL HIGH (ref 65–99)
Glucose-Capillary: 108 mg/dL — ABNORMAL HIGH (ref 65–99)
Glucose-Capillary: 123 mg/dL — ABNORMAL HIGH (ref 65–99)
Glucose-Capillary: 136 mg/dL — ABNORMAL HIGH (ref 65–99)
Glucose-Capillary: 146 mg/dL — ABNORMAL HIGH (ref 65–99)
Glucose-Capillary: 237 mg/dL — ABNORMAL HIGH (ref 65–99)
Glucose-Capillary: 166 mg/dL — ABNORMAL HIGH (ref 65–99)
Glucose-Capillary: 106 mg/dL — ABNORMAL HIGH (ref 65–99)

## 2015-07-26 LAB — CBC
HCT: 35.8 % — ABNORMAL LOW (ref 36.0–46.0)
Hemoglobin: 11.8 g/dL — ABNORMAL LOW (ref 12.0–15.0)
MCH: 31.9 pg (ref 26.0–34.0)
MCHC: 33 g/dL (ref 30.0–36.0)
MCV: 96.8 fL (ref 78.0–100.0)
PLATELETS: 219 10*3/uL (ref 150–400)
RBC: 3.7 MIL/uL — ABNORMAL LOW (ref 3.87–5.11)
RDW: 13.8 % (ref 11.5–15.5)
WBC: 9.9 10*3/uL (ref 4.0–10.5)

## 2015-07-26 LAB — HEMOGLOBIN A1C
Hgb A1c MFr Bld: 12.6 % — ABNORMAL HIGH (ref 4.8–5.6)
MEAN PLASMA GLUCOSE: 315 mg/dL

## 2015-07-26 MED ORDER — MORPHINE SULFATE (PF) 2 MG/ML IV SOLN
2.0000 mg | INTRAVENOUS | Status: DC | PRN
  Administered 2015-07-26 – 2015-07-27 (×3): 2 mg via INTRAVENOUS
  Filled 2015-07-26 (×3): qty 1

## 2015-07-26 MED ORDER — INSULIN ASPART 100 UNIT/ML ~~LOC~~ SOLN
0.0000 [IU] | Freq: Three times a day (TID) | SUBCUTANEOUS | Status: DC
  Administered 2015-07-26 – 2015-07-27 (×2): 2 [IU] via SUBCUTANEOUS

## 2015-07-26 MED ORDER — SODIUM CHLORIDE 0.9 % IV BOLUS (SEPSIS)
1000.0000 mL | Freq: Once | INTRAVENOUS | Status: AC
  Administered 2015-07-26: 1000 mL via INTRAVENOUS

## 2015-07-26 MED ORDER — KETOROLAC TROMETHAMINE 30 MG/ML IJ SOLN
30.0000 mg | Freq: Once | INTRAMUSCULAR | Status: AC
  Administered 2015-07-26: 30 mg via INTRAVENOUS
  Filled 2015-07-26: qty 1

## 2015-07-26 MED ORDER — SALINE SPRAY 0.65 % NA SOLN
1.0000 | NASAL | Status: DC | PRN
  Administered 2015-07-26: 1 via NASAL
  Filled 2015-07-26: qty 44

## 2015-07-26 MED ORDER — SODIUM CHLORIDE 0.9 % IV BOLUS (SEPSIS)
500.0000 mL | Freq: Once | INTRAVENOUS | Status: AC
  Administered 2015-07-26: 500 mL via INTRAVENOUS

## 2015-07-26 MED ORDER — SODIUM CHLORIDE 0.9 % IV SOLN
INTRAVENOUS | Status: AC
  Administered 2015-07-26: 17:00:00 via INTRAVENOUS

## 2015-07-26 MED ORDER — INSULIN ASPART PROT & ASPART (70-30 MIX) 100 UNIT/ML ~~LOC~~ SUSP
22.0000 [IU] | Freq: Two times a day (BID) | SUBCUTANEOUS | Status: DC
  Administered 2015-07-26 – 2015-07-27 (×3): 22 [IU] via SUBCUTANEOUS
  Filled 2015-07-26: qty 10

## 2015-07-26 NOTE — Progress Notes (Addendum)
[facility] PROGRESS NOTE  Kristine Allison ZHG:[id number] DOB: 30-Aug-1981 DOA: 07/25/2015 PCP: Andrena Mews, MD  Length of stay: 1   Assessment/Plan: Active Problems:   Tobacco use disorder   Noncompliance with medication regimen   Acute renal failure syndrome ([place])   DKA (diabetic ketoacidoses) ([place])   Diabetic ketoacidosis without coma associated with type 1 diabetes mellitus (HCC)     1 Severe diabetic ketoacidosis , precipitating cause noncompliance vs UTI Continue stepdown unit, mentation appropriate at this time, transfer to MedSurg tomorrow Off IV insulin since 4 AM , (MD was not called and notified  ) started Novolin 70/30, 22 units twice a day with sliding scale insulin -Check B met every 4 hours and magnesium level Hemoglobin A1c 12.6, unchanged from 02/09/15 -Diabetes coordinator consult.   2. AKi, baseline 0.6, presented with a creatinine of 1.49 -due to dehydration, volume depletion from DKA Back to baseline today  3. Early COPD/ Tobacco use disorder -Stable, nebs when necessary  4. Leukocytosis secondary to UTI -Suspect reactive and partly due to hemoconcentration -UA abnormal will continue ceftriaxone follow-up urine cultures -hydrate, monitor  5.hypotension doubt sepsis , previous notes reflect baseline BP in the 90;'s  DVT prophylaxsis  Lovenox  Code Status:      Code Status Orders        Start     Ordered   07/25/15 1734  Full code   Continuous     07/25/15 1733     Family Communication: family updated about patient's clinical progress Disposition Plan:  Continue stepdown, anticipate transfer to telemetry tomorrow, may transfer tonight if a bed is needed in step down   Brief narrative: 34 y.o. female with history of type 1 diabetes, history of noncompliance, recurrent admissions for diabetic ketoacidosis presents to the ER with the above complaints. She is supposed to be on insulin 70/30, 20 units 3 times a day, she  reports being out of her medicines for the last 3-4 days, and was due to get paid today to by her insulin. In the interim she's been sick with nausea, vomiting, back pain, for 2-3 days. Her glucometer read as too high, subsequently EMS was called and she was brought to the emergency room, last blood glucose was 571. She was found to be in severe diabetic ketoacidosis pH of 7.04 PCO2 14, bicarbonate less than 7 sodium 133 and creatinine 1.49. WBC is elevated at 23K, denies any fevers or chills denies any cough congestion, no dysuria or urinary frequency. UA remarkable for 21-50 WBCs moderate leukocyte esterase   Consultants:  None  Procedures:  None  Antibiotics: Anti-infectives    Start     Dose/Rate Route Frequency Ordered Stop   07/26/15 1000  cefTRIAXone (ROCEPHIN) 1 g in dextrose 5 % 50 mL IVPB     1 g 100 mL/hr over 30 Minutes Intravenous Every 24 hours 07/25/15 1626     07/25/15 1600  cefTRIAXone (ROCEPHIN) 1 g in dextrose 5 % 50 mL IVPB     1 g 100 mL/hr over 30 Minutes Intravenous  Once 07/25/15 1546 07/25/15 1721         HPI/Subjective: Hypotensive this morning, received multiple fluid boluses according to RN the patient's insulin drip was discontinued at 4 AM. Patient did not receive any long-acting insulin prior to that.  Objective: Filed Vitals:   07/26/15 0404 07/26/15 0518 07/26/15 0600 07/26/15 0800  BP: 90/46  90/38   Pulse: 93  87  Temp:  98.7 F (37.1 C)  98.4 F (36.9 C)  TempSrc:  Oral  Oral  Resp: 13  14   Height:      Weight:      SpO2: 100%  96%     Intake/Output Summary (Last 24 hours) at 07/26/15 1012 Last data filed at 07/26/15 0700  Gross per 24 hour  Intake 3635.78 ml  Output      0 ml  Net 3635.78 ml    Exam:  General: No acute respiratory distress Lungs: Clear to auscultation bilaterally without wheezes or crackles Cardiovascular: Regular rate and rhythm without murmur gallop or rub normal S1 and S2 Abdomen: Nontender,  nondistended, soft, bowel sounds positive, no rebound, no ascites, no appreciable mass Extremities: No significant cyanosis, clubbing, or edema bilateral lower extremities     Data Review   Micro Results Recent Results (from the past 240 hour(s))  Urine culture     Status: None (Preliminary result)   Collection Time: 07/25/15  4:03 PM  Result Value Ref Range Status   Specimen Description URINE, CLEAN CATCH  Final   Special Requests NONE  Final   Culture   Final    NO GROWTH < 12 HOURS Performed at [facility]    Report Status PENDING  Incomplete  MRSA PCR Screening     Status: None   Collection Time: 07/25/15  5:26 PM  Result Value Ref Range Status   MRSA by PCR NEGATIVE NEGATIVE Final    Comment:        The GeneXpert MRSA Assay (FDA approved for NASAL specimens only), is one component of a comprehensive MRSA colonization surveillance program. It is not intended to diagnose MRSA infection nor to guide or monitor treatment for MRSA infections.     Radiology Reports Dg Chest Portable 1 View  07/25/2015  CLINICAL DATA:  Hyperglycemia, back pain, polyuria, nausea, diabetic, noncompliant with medications, asthma, smoker EXAM: PORTABLE CHEST 1 VIEW COMPARISON:  Portable exam 1613 hours compared 05/03/2015 FINDINGS: Normal heart size, mediastinal contours, and pulmonary vascularity. Lungs clear. No pleural effusion or pneumothorax. Numerous EKG leads project over chest. No acute bony abnormalities. IMPRESSION: No acute abnormalities. Electronically Signed   By: Lavonia Dana M.D.   On: 07/25/2015 16:41     CBC  Recent Labs Lab 07/25/15 1445 07/26/15 0520  WBC 23.0* 9.9  HGB 15.2* 11.8*  HCT 47.7* 35.8*  PLT 382 219  MCV 102.1* 96.8  MCH 32.5 31.9  MCHC 31.9 33.0  RDW 14.1 13.8    Chemistries   Recent Labs Lab 07/25/15 1445 07/25/15 1815 07/25/15 2100 07/26/15 0115 07/26/15 0520  NA 133* 139 135 132* 133*  K 5.2* 4.8 4.8 4.5 3.9  CL 91* 107 110 112*  109  CO2 <7* 7* 10* 11* 18*  GLUCOSE 655* 394* 202* 152* 113*  BUN 32* 26* '19 15 13  ' CREATININE 1.49* 1.08* 0.94 0.72 0.64  CALCIUM 8.8* 7.9* 7.8* 7.4* 7.3*  MG  --  2.1  --   --   --   AST 23  --   --   --   --   ALT 24  --   --   --   --   ALKPHOS 105  --   --   --   --   BILITOT 1.4*  --   --   --   --    ------------------------------------------------------------------------------------------------------------------ estimated creatinine clearance is 87.2 mL/min (by C-G formula based on Cr of  0.64). ------------------------------------------------------------------------------------------------------------------  Recent Labs  07/25/15 1445  HGBA1C 12.6*   ------------------------------------------------------------------------------------------------------------------ No results for input(s): CHOL, HDL, LDLCALC, TRIG, CHOLHDL, LDLDIRECT in the last 72 hours. ------------------------------------------------------------------------------------------------------------------ No results for input(s): TSH, T4TOTAL, T3FREE, THYROIDAB in the last 72 hours.  Invalid input(s): FREET3 ------------------------------------------------------------------------------------------------------------------ No results for input(s): VITAMINB12, FOLATE, FERRITIN, TIBC, IRON, RETICCTPCT in the last 72 hours.  Coagulation profile No results for input(s): INR, PROTIME in the last 168 hours.  No results for input(s): DDIMER in the last 72 hours.  Cardiac Enzymes No results for input(s): CKMB, TROPONINI, MYOGLOBIN in the last 168 hours.  Invalid input(s): CK ------------------------------------------------------------------------------------------------------------------ Invalid input(s): POCBNP   CBG:  Recent Labs Lab 07/26/15 0252 07/26/15 0350 07/26/15 0447 07/26/15 0600 07/26/15 0803  GLUCAP 123* 107* 108* 119* 126*       Studies: Dg Chest Portable 1 View  07/25/2015  CLINICAL  DATA:  Hyperglycemia, back pain, polyuria, nausea, diabetic, noncompliant with medications, asthma, smoker EXAM: PORTABLE CHEST 1 VIEW COMPARISON:  Portable exam 1613 hours compared 05/03/2015 FINDINGS: Normal heart size, mediastinal contours, and pulmonary vascularity. Lungs clear. No pleural effusion or pneumothorax. Numerous EKG leads project over chest. No acute bony abnormalities. IMPRESSION: No acute abnormalities. Electronically Signed   By: Lavonia Dana M.D.   On: 07/25/2015 16:41      Lab Results  Component Value Date   HGBA1C 12.6* 07/25/2015   HGBA1C 12.6* 02/09/2015   HGBA1C 12.7 11/03/2014   Lab Results  Component Value Date   CREATININE 0.64 07/26/2015       Scheduled Meds: . antiseptic oral rinse  7 mL Mouth Rinse q12n4p  . antiseptic oral rinse  7 mL Mouth Rinse q12n4p  . cefTRIAXone (ROCEPHIN)  IV  1 g Intravenous Q24H  . chlorhexidine  15 mL Mouth Rinse BID  . enoxaparin (LOVENOX) injection  40 mg Subcutaneous Q24H  . insulin aspart  0-9 Units Subcutaneous TID WC  . insulin aspart protamine- aspart  22 Units Subcutaneous BID WC  . sodium chloride  500 mL Intravenous Once   Continuous Infusions: . sodium chloride    . dextrose 5 % and 0.45% NaCl 125 mL/hr at 07/26/15 0456  . insulin (NOVOLIN-R) infusion Stopped (07/26/15 0452)    Active Problems:   Tobacco use disorder   Noncompliance with medication regimen   Acute renal failure syndrome (HCC)   DKA (diabetic ketoacidoses) ([employer])   Diabetic ketoacidosis without coma associated with type 1 diabetes mellitus ([place])    Time spent: 45 minutes   [facility] Pager [phone]. If 7PM-7AM, please contact night-coverage at [email or web address], password [facility] 07/26/2015, 10:12 AM  LOS: 1 day

## 2015-07-26 NOTE — Progress Notes (Signed)
Inpatient Diabetes Program Recommendations  AACE/ADA: New Consensus Statement on Inpatient Glycemic Control (2015)  Target Ranges:  Prepandial:   less than 140 mg/dL      Peak postprandial:   less than 180 mg/dL (1-2 hours)      Critically ill patients:  140 - 180 mg/dL   Review of Glycemic Control  Diabetes history: DM1 Outpatient Diabetes medications: Novolin 70/30 20 units tidwc Current orders for Inpatient glycemic control: IV insulin per GlucoStabilizer d/ced @ 0400. Started 70/30 22 units bid at 1014.  Results for Kristine Allison, Kristine Allison (MRN [id number]) as of 07/26/2015 11:27  Ref. Range 07/26/2015 02:52 07/26/2015 03:50 07/26/2015 04:47 07/26/2015 06:00 07/26/2015 08:03  Glucose-Capillary Latest Ref Range: 65-99 mg/dL 123 (H) 107 (H) 108 (H) 119 (H) 126 (H)  Results for Kristine Allison, Kristine Allison (MRN [id number]) as of 07/26/2015 11:27  Ref. Range 07/26/2015 09:47  Sodium Latest Ref Range: 135-145 mmol/L 133 (L)  Potassium Latest Ref Range: 3.5-5.1 mmol/L 3.4 (L)  Chloride Latest Ref Range: 101-111 mmol/L 109  CO2 Latest Ref Range: 22-32 mmol/L 16 (L)  BUN Latest Ref Range: 6-20 mg/dL 10  Creatinine Latest Ref Range: 0.44-1.00 mg/dL 0.55  Calcium Latest Ref Range: 8.9-10.3 mg/dL 7.2 (L)  EGFR (Non-African Amer.) Latest Ref Range: >60 mL/min >60  EGFR (African American) Latest Ref Range: >60 mL/min >60  Glucose Latest Ref Range: 65-99 mg/dL 109 (H)  Anion gap Latest Ref Range: 5-15  8  Results for Kristine Allison, Kristine Allison (MRN [id number]) as of 07/26/2015 11:27  Ref. Range 07/25/2015 14:45  Hemoglobin A1C Latest Ref Range: 4.8-5.6 % 12.6 (H)   34 y.o. female with history of type 1 diabetes, history of noncompliance, recurrent admissions for diabetic ketoacidosis presents to the ER with the above complaints. Spoke with patient and she reported being out of insulin for past 3-4 days d/t no money. States glucose meter is broken and she has not been checking blood sugars. Three admissions in past  6 months d/t DKA, non-compliance with insulin. Has not f/u'ed with PCP as ordered in discharge summary. Pt very sleepy and did not want to talk. Writer discussed importance of taking insulin daily and checking blood sugars. Also discussed f/u with PCP. Pt appeared uninterested in conversation regarding diabetes. May benefit from psych consult for continued self-neglect or depression.  Inpatient Diabetes Program Recommendations:    Add CHO mod medium diet. Add Novolog HS correction. AG closed although CO2 - 16. Watch.  Needs care management for f/u appt [facility] and [facility]. Will need glucose meter prescription along with lancets and strips.  Will continue to follow. Thank you. Lorenda Peck, RD, LDN, CDE Inpatient Diabetes Coordinator [phone]

## 2015-07-27 DIAGNOSIS — F17200 Nicotine dependence, unspecified, uncomplicated: Secondary | ICD-10-CM

## 2015-07-27 DIAGNOSIS — N179 Acute kidney failure, unspecified: Secondary | ICD-10-CM

## 2015-07-27 DIAGNOSIS — N3000 Acute cystitis without hematuria: Secondary | ICD-10-CM

## 2015-07-27 DIAGNOSIS — Z9114 Patient's other noncompliance with medication regimen: Secondary | ICD-10-CM

## 2015-07-27 DIAGNOSIS — E1010 Type 1 diabetes mellitus with ketoacidosis without coma: Principal | ICD-10-CM

## 2015-07-27 LAB — COMPREHENSIVE METABOLIC PANEL
ALK PHOS: 57 U/L (ref 38–126)
ALT: 25 U/L (ref 14–54)
AST: 36 U/L (ref 15–41)
Albumin: 2.8 g/dL — ABNORMAL LOW (ref 3.5–5.0)
Anion gap: 7 (ref 5–15)
BILIRUBIN TOTAL: 0.6 mg/dL (ref 0.3–1.2)
BUN: 8 mg/dL (ref 6–20)
CALCIUM: 7.8 mg/dL — AB (ref 8.9–10.3)
CO2: 19 mmol/L — ABNORMAL LOW (ref 22–32)
Chloride: 108 mmol/L (ref 101–111)
Creatinine, Ser: 0.61 mg/dL (ref 0.44–1.00)
GFR calc Af Amer: >60 mL/min (ref >60)
Glucose, Bld: 96 mg/dL (ref 65–99)
POTASSIUM: 3.4 mmol/L — AB (ref 3.5–5.1)
Sodium: 134 mmol/L — ABNORMAL LOW (ref 135–145)
TOTAL PROTEIN: 4.8 g/dL — AB (ref 6.5–8.1)

## 2015-07-27 LAB — BLOOD GAS, ARTERIAL
ACID-BASE DEFICIT: 10.6 mmol/L — AB (ref 0.0–2.0)
ACID-BASE DEFICIT: 8.5 mmol/L — AB (ref 0.0–2.0)
Bicarbonate: 14.2 mEq/L — ABNORMAL LOW (ref 20.0–24.0)
Bicarbonate: 15 mEq/L — ABNORMAL LOW (ref 20.0–24.0)
DRAWN BY: 307971
DRAWN BY: 307971
FIO2: 0.21
FIO2: 0.21
O2 SAT: 97.9 %
O2 Saturation: 98.5 %
PATIENT TEMPERATURE: 98.6
PATIENT TEMPERATURE: 98.6
PCO2 ART: 29.1 mmHg — AB (ref 35.0–45.0)
TCO2: 13.3 mmol/L (ref 0–100)
TCO2: 13.9 mmol/L (ref 0–100)
pCO2 arterial: 26.2 mmHg — ABNORMAL LOW (ref 35.0–45.0)
pH, Arterial: 7.309 — ABNORMAL LOW (ref 7.350–7.450)
pH, Arterial: 7.378 (ref 7.350–7.450)
pO2, Arterial: 94.7 mmHg (ref 80.0–100.0)
pO2, Arterial: 106 mmHg — ABNORMAL HIGH (ref 80.0–100.0)

## 2015-07-27 LAB — CBC
HEMATOCRIT: 35 % — AB (ref 36.0–46.0)
Hemoglobin: 11.8 g/dL — ABNORMAL LOW (ref 12.0–15.0)
MCH: 31.6 pg (ref 26.0–34.0)
MCHC: 33.7 g/dL (ref 30.0–36.0)
MCV: 93.8 fL (ref 78.0–100.0)
PLATELETS: 160 10*3/uL (ref 150–400)
RBC: 3.73 MIL/uL — ABNORMAL LOW (ref 3.87–5.11)
RDW: 13.7 % (ref 11.5–15.5)
WBC: 4.1 10*3/uL (ref 4.0–10.5)

## 2015-07-27 LAB — URINE CULTURE: Culture: >=100000

## 2015-07-27 LAB — GLUCOSE, CAPILLARY: GLUCOSE-CAPILLARY: 155 mg/dL — AB (ref 65–99)

## 2015-07-27 MED ORDER — GLUCOSE BLOOD VI STRP: ORAL_STRIP | Status: DC

## 2015-07-27 MED ORDER — ALBUTEROL SULFATE (2.5 MG/3ML) 0.083% IN NEBU: 3.0000 mL | INHALATION_SOLUTION | Freq: Four times a day (QID) | RESPIRATORY_TRACT | Status: DC | PRN

## 2015-07-27 MED ORDER — INSULIN NPH ISOPHANE & REGULAR (70-30) 100 UNIT/ML ~~LOC~~ SUSP: 20.0000 [IU] | Freq: Two times a day (BID) | SUBCUTANEOUS | Status: DC

## 2015-07-27 MED ORDER — BLOOD GLUCOSE METER KIT
PACK | Status: DC
Start: 2015-07-27 — End: 2015-10-02

## 2015-07-27 MED ORDER — ACETAMINOPHEN 325 MG PO TABS: 650.0000 mg | ORAL_TABLET | Freq: Four times a day (QID) | ORAL | Status: DC | PRN

## 2015-07-27 MED ORDER — LEVOFLOXACIN 500 MG PO TABS: 500.0000 mg | ORAL_TABLET | Freq: Every day | ORAL | Status: DC

## 2015-07-27 MED ORDER — ALBUTEROL SULFATE HFA 108 (90 BASE) MCG/ACT IN AERS: 2.0000 | INHALATION_SPRAY | Freq: Four times a day (QID) | RESPIRATORY_TRACT | Status: DC | PRN

## 2015-07-27 MED ORDER — TRAMADOL HCL 50 MG PO TABS: 50.0000 mg | ORAL_TABLET | Freq: Four times a day (QID) | ORAL | Status: DC | PRN

## 2015-07-27 MED ORDER — FREESTYLE SYSTEM KIT: 1.0000 | PACK | Status: DC | PRN

## 2015-07-27 MED ORDER — ONETOUCH ULTRASOFT LANCETS MISC: Status: DC

## 2015-07-27 NOTE — Discharge Summary (Signed)
Physician Discharge Summary  Kristine Allison ZOX:[id number] DOB: Feb 12, 1981 DOA: 07/25/2015  PCP: Janit Pagan, MD  Admit date: 07/25/2015 Discharge date: 07/27/2015  Time spent: >35 minutes  Recommendations for Outpatient Follow-up:  PCP in 1 week  Discharge Diagnoses:  Active Problems:   Tobacco use disorder   Noncompliance with medication regimen   Acute renal failure syndrome (HCC)   DKA (diabetic ketoacidoses) (HCC)   Diabetic ketoacidosis without coma associated with type 1 diabetes mellitus (HCC)   Discharge Condition: srable   Diet recommendation: DM  Filed Weights   07/25/15 1423  Weight: 56.7 kg (125 lb)    History of present illness:  34 y.o. female with history of type 1 diabetes, history of noncompliance, recurrent admissions for diabetic ketoacidosis presents to the ER with hyperglycemia, nausea, vomiting.  She is supposed to be on insulin 70/30, 20 units 3 times a day, she reports being out of her medicines for the last 3-4 days, and was due to get paid today to by her insulin. she's been sick with nausea, vomiting, back pain, for 2-3 days.  Hospital Course:   1. DKA. Resolved. precipitating cause noncompliance. DKA _>ressolved on IV insulin, then transitioned to SQ insulin regimen 70/30. Glucose if well controlled. Patient is tolerating diet well  2. DM uncontrolled with DKA due to non compliance. ha1c-12.6.  -resumed SQ insulin regimen (currently, stable on 70/30insulin 20 units BID). Reemphasized medication compliance. case management for medication assistance 3. AKi, baseline 0.6, presented with a creatinine of 1.49 likely due to dehydration, volume depletion from DKA  Back to baseline today  4. Early COPD/ Tobacco use disorder  -Stable, nebs when necessary. counseled to stop smoking   5. Leukocytosis secondary to UTI. Leukocytosis->resolved. Patient is afebrile.  Transitioned to oral antibiotic to complete the treatment regimen. Pend urine culture.  Recommended to f/u in 3-5 days with PCP   Planned for discharge home today    Procedures:  none (i.e. Studies not automatically included, echos, thoracentesis, etc; not x-rays)  Consultations:  none  Discharge Exam: Filed Vitals:   07/27/15 0600  BP: 107/61  Pulse: 75  Temp:   Resp: 12    General: alert. Oriented no distress  Cardiovascular: s1,s2 rrr Respiratory: CTA BL  Discharge Instructions  Discharge Instructions    Diet - low sodium heart healthy    Complete by:  As directed      Discharge instructions    Complete by:  As directed   Please follow up with Primary care doctor in 1 week     Increase activity slowly    Complete by:  As directed             Medication List    STOP taking these medications        ibuprofen 800 MG tablet  Commonly known as:  ADVIL,MOTRIN     traZODone 50 MG tablet  Commonly known as:  DESYREL      TAKE these medications        acetaminophen 325 MG tablet  Commonly known as:  TYLENOL  Take 2 tablets (650 mg total) by mouth every 6 (six) hours as needed for mild pain (or Fever >/= 101).     albuterol 108 (90 BASE) MCG/ACT inhaler  Commonly known as:  PROVENTIL HFA;VENTOLIN HFA  Inhale 2 puffs into the lungs every 6 (six) hours as needed for wheezing or shortness of breath.     aspirin 81 MG EC tablet  Take 1 tablet (81  mg total) by mouth daily.     insulin NPH-regular Human (70-30) 100 UNIT/ML injection  Commonly known as:  NOVOLIN 70/30  Inject 20 Units into the skin 2 (two) times daily with a meal.     levofloxacin 500 MG tablet  Commonly known as:  LEVAQUIN  Take 1 tablet (500 mg total) by mouth daily.     onetouch ultrasoft lancets  Use as instructed       Allergies  Allergen Reactions  . Latex Rash       Follow-up Information    Follow up with Janit PaganENIOLA, KEHINDE, MD. Schedule an appointment as soon as possible for a visit in 1 week.   Specialty:  Family Medicine   Contact information:   [address] [place] [address] [phone]        The results of significant diagnostics from this hospitalization (including imaging, microbiology, ancillary and laboratory) are listed below for reference.    Significant Diagnostic Studies: Dg Chest Portable 1 View  07/25/2015  CLINICAL DATA:  Hyperglycemia, back pain, polyuria, nausea, diabetic, noncompliant with medications, asthma, smoker EXAM: PORTABLE CHEST 1 VIEW COMPARISON:  Portable exam 1613 hours compared 05/03/2015 FINDINGS: Normal heart size, mediastinal contours, and pulmonary vascularity. Lungs clear. No pleural effusion or pneumothorax. Numerous EKG leads project over chest. No acute bony abnormalities. IMPRESSION: No acute abnormalities. Electronically Signed   By: Ulyses SouthwardMark  Boles M.D.   On: 07/25/2015 16:41    Microbiology: Recent Results (from the past 240 hour(s))  Urine culture     Status: None (Preliminary result)   Collection Time: 07/25/15  4:03 PM  Result Value Ref Range Status   Specimen Description URINE, CLEAN CATCH  Final   Special Requests NONE  Final   Culture   Final    CULTURE REINCUBATED FOR BETTER GROWTH [facility]    Report Status PENDING  Incomplete  MRSA PCR Screening     Status: None   Collection Time: 07/25/15  5:26 PM  Result Value Ref Range Status   MRSA by PCR NEGATIVE NEGATIVE Final    Comment:        The GeneXpert MRSA Assay (FDA approved for NASAL specimens only), is one component of a comprehensive MRSA colonization surveillance program. It is not intended to diagnose MRSA infection nor to guide or monitor treatment for MRSA infections.      Labs: Basic Metabolic Panel:  Recent Labs Lab 07/25/15 1815  07/26/15 0115 07/26/15 0520 07/26/15 0947 07/26/15 1314 07/27/15 0312  NA 139  < > 132* 133* 133* 132* 134*  K 4.8  < > 4.5 3.9 3.4* 4.2 3.4*  CL 107  < > 112* 109 109 109 108  CO2 7*  < > 11* 18* 16* 16* 19*  GLUCOSE 394*  < > 152* 113* 109*  82 96  BUN 26*  < > 15 13 10 10 8   CREATININE 1.08*  < > 0.72 0.64 0.55 0.63 0.61  CALCIUM 7.9*  < > 7.4* 7.3* 7.2* 7.5* 7.8*  MG 2.1  --   --   --   --   --   --   < > = values in this interval not displayed. Liver Function Tests:  Recent Labs Lab 07/25/15 1445 07/27/15 0312  AST 23 36  ALT 24 25  ALKPHOS 105 57  BILITOT 1.4* 0.6  PROT 7.3 4.8*  ALBUMIN 4.3 2.8*   No results for input(s): LIPASE, AMYLASE in the last 168  hours. No results for input(s): AMMONIA in the last 168 hours. CBC:  Recent Labs Lab 07/25/15 1445 07/26/15 0520 07/27/15 0312  WBC 23.0* 9.9 4.1  HGB 15.2* 11.8* 11.8*  HCT 47.7* 35.8* 35.0*  MCV 102.1* 96.8 93.8  PLT 382 219 160   Cardiac Enzymes: No results for input(s): CKTOTAL, CKMB, CKMBINDEX, TROPONINI in the last 168 hours. BNP: BNP (last 3 results) No results for input(s): BNP in the last 8760 hours.  ProBNP (last 3 results) No results for input(s): PROBNP in the last 8760 hours.  CBG:  Recent Labs Lab 07/26/15 0803 07/26/15 1155 07/26/15 1359 07/26/15 1657 07/26/15 2204  GLUCAP 126* 98 145* 166* 106*       Signed:  Westlynn Fifer N  [facility] 07/27/2015, 8:17 AM

## 2015-07-27 NOTE — Progress Notes (Signed)
[id number]/Rhonda Davis,RN,BSN,CCM: Gave information to patient on how to obtain free glucose meter and is already patient at the [facility].

## 2015-09-24 ENCOUNTER — Encounter (HOSPITAL_COMMUNITY): Payer: Self-pay

## 2015-09-24 ENCOUNTER — Inpatient Hospital Stay (HOSPITAL_COMMUNITY)
Admission: EM | Admit: 2015-09-24 | Discharge: 2015-09-25 | DRG: 638 | Discharge status: Against Medical Advice | Payer: Self-pay | Attending: Family Medicine | Admitting: Family Medicine

## 2015-09-24 DIAGNOSIS — F17210 Nicotine dependence, cigarettes, uncomplicated: Secondary | ICD-10-CM | POA: Diagnosis present

## 2015-09-24 DIAGNOSIS — Z9119 Patient's noncompliance with other medical treatment and regimen: Secondary | ICD-10-CM

## 2015-09-24 DIAGNOSIS — Z91040 Latex allergy status: Secondary | ICD-10-CM

## 2015-09-24 DIAGNOSIS — E860 Dehydration: Secondary | ICD-10-CM | POA: Diagnosis present

## 2015-09-24 DIAGNOSIS — Z7982 Long term (current) use of aspirin: Secondary | ICD-10-CM

## 2015-09-24 DIAGNOSIS — R0602 Shortness of breath: Secondary | ICD-10-CM | POA: Diagnosis present

## 2015-09-24 DIAGNOSIS — I252 Old myocardial infarction: Secondary | ICD-10-CM

## 2015-09-24 DIAGNOSIS — E1010 Type 1 diabetes mellitus with ketoacidosis without coma: Principal | ICD-10-CM | POA: Diagnosis present

## 2015-09-24 DIAGNOSIS — N179 Acute kidney failure, unspecified: Secondary | ICD-10-CM | POA: Diagnosis present

## 2015-09-24 LAB — URINE MICROSCOPIC-ADD ON

## 2015-09-24 LAB — URINALYSIS, ROUTINE W REFLEX MICROSCOPIC
Bilirubin Urine: NEGATIVE
Hgb urine dipstick: NEGATIVE
Ketones, ur: >80 mg/dL — AB
LEUKOCYTES UA: NEGATIVE
NITRITE: NEGATIVE
PH: 5 (ref 5.0–8.0)
PROTEIN: NEGATIVE mg/dL
Specific Gravity, Urine: 1.024 (ref 1.005–1.030)

## 2015-09-24 LAB — CBG MONITORING, ED: Glucose-Capillary: 389 mg/dL — ABNORMAL HIGH (ref 65–99)

## 2015-09-24 MED ORDER — SODIUM CHLORIDE 0.9 % IV BOLUS (SEPSIS)
1000.0000 mL | Freq: Once | INTRAVENOUS | Status: AC
  Administered 2015-09-25: 1000 mL via INTRAVENOUS

## 2015-09-24 NOTE — ED Notes (Signed)
CBG 389, RN-Melanie aware

## 2015-09-24 NOTE — ED Provider Notes (Signed)
CSN: [id number]     Arrival date & time 09/24/15  2247 History  By signing my name below, I, Evelene Croon, attest that this documentation has been prepared under the direction and in the presence of Julianne Rice, MD . Electronically Signed: Evelene Croon, Scribe. 09/25/2015. 12:28 AM.  Chief Complaint  Patient presents with  . Hyperglycemia   The history is provided by the patient. No language interpreter was used.   HPI Comments:  Kristine Allison is a 35 y.o. female with a history of DM, who presents to the Emergency Department via EMS complaining of hyperglycemia this evening. She notes her blood sugar often ranges from 245-300 but was 509 this evening. She has been complaint with her DM meds. She reports associated nausea and occasional episodes of vomiting for ~ 3-4 days, decreased appetite, HA and back pain. She notes her niece was sick with similar symptoms ~ 1 week ago. She denies swelling/pain in her BLE, frequent urination, dysuria, and diarrhea. No alleviating factors noted.  Past Medical History  Diagnosis Date  . Asthma   . Diabetes mellitus without complication ([place])   . Headache(784.0)   . Anxiety    Past Surgical History  Procedure Laterality Date  . No past surgeries     Family History  Problem Relation Age of Onset  . Heart disease      No family history   Social History  Substance Use Topics  . Smoking status: Current Every Day Smoker -- 0.25 packs/day for 20 years    Types: Cigarettes  . Smokeless tobacco: Never Used  . Alcohol Use: No   OB History    No data available     Review of Systems  Constitutional: Positive for appetite change and fatigue. Negative for fever and chills.       +Hyperglycemia  HENT: Negative for congestion, sinus pressure and sore throat.   Respiratory: Negative for cough and shortness of breath.   Cardiovascular: Negative for leg swelling.  Gastrointestinal: Positive for nausea and vomiting. Negative for abdominal pain  and diarrhea.  Genitourinary: Negative for dysuria and frequency.  Musculoskeletal: Positive for myalgias and back pain. Negative for neck pain.  Skin: Negative for rash and wound.  Neurological: Positive for headaches. Negative for syncope, weakness, light-headedness and numbness.  All other systems reviewed and are negative.  Allergies  Latex  Home Medications   Prior to Admission medications   Medication Sig Start Date End Date Taking? Authorizing Provider  aspirin EC 81 MG EC tablet Take 1 tablet (81 mg total) by mouth daily. 11/20/13  Yes Leone Brand, MD  insulin NPH-regular Human (NOVOLIN 70/30) (70-30) 100 UNIT/ML injection Inject 20 Units into the skin 2 (two) times daily with a meal. 07/27/15  Yes Kinnie Feil, MD  acetaminophen (TYLENOL) 325 MG tablet Take 2 tablets (650 mg total) by mouth every 6 (six) hours as needed for mild pain (or Fever >/= 101). Patient not taking: Reported on 09/24/2015 07/27/15   Kinnie Feil, MD  albuterol (PROVENTIL HFA;VENTOLIN HFA) 108 (90 BASE) MCG/ACT inhaler Inhale 2 puffs into the lungs every 6 (six) hours as needed for wheezing or shortness of breath. Patient not taking: Reported on 09/24/2015 07/27/15   Kinnie Feil, MD  blood glucose meter kit and supplies Dispense based on patient and insurance preference. Use up to four times daily as directed. (FOR ICD-9 250.00, 250.01). 07/27/15   Kinnie Feil, MD  glucose blood test strip Use as instructed  07/27/15   Kinnie Feil, MD  glucose monitoring kit (FREESTYLE) monitoring kit 1 each by Does not apply route as needed for other. 07/27/15   Kinnie Feil, MD  Lancets (ONETOUCH ULTRASOFT) lancets Use as instructed 07/27/15   Kinnie Feil, MD  levofloxacin (LEVAQUIN) 500 MG tablet Take 1 tablet (500 mg total) by mouth daily. Patient not taking: Reported on 09/24/2015 07/27/15   Kinnie Feil, MD  traMADol (ULTRAM) 50 MG tablet Take 1 tablet (50 mg total) by mouth every 6 (six) hours as  needed. Patient not taking: Reported on 09/24/2015 07/27/15   Kinnie Feil, MD   BP 93/68 mmHg  Pulse 91  Temp(Src) 98.6 F (37 C) (Oral)  Resp 18  Ht '5\' 4"'  (1.626 m)  SpO2 100% Physical Exam  Constitutional: She is oriented to person, place, and time. She appears well-developed and well-nourished. No distress.  Dry appearing  HENT:  Head: Normocephalic and atraumatic.  Mouth/Throat: Oropharynx is clear and moist. No oropharyngeal exudate.  Eyes: EOM are normal. Pupils are equal, round, and reactive to light.  Neck: Normal range of motion. Neck supple. No JVD present.  Cardiovascular: Normal rate and regular rhythm.  Exam reveals no gallop and no friction rub.   No murmur heard. Pulmonary/Chest: Effort normal and breath sounds normal. No stridor. No respiratory distress. She has no wheezes. She has no rales. She exhibits no tenderness.  Abdominal: Soft. Bowel sounds are normal. She exhibits no distension and no mass. There is no tenderness. There is no rebound and no guarding.  Musculoskeletal: Normal range of motion. She exhibits no edema or tenderness.  No calf swelling or tenderness. Distal pulses intact.  Lymphadenopathy:    She has no cervical adenopathy.  Neurological: She is alert and oriented to person, place, and time.  5/5 motor in all extremities. Sensation is fully intact.  Skin: Skin is warm and dry. No rash noted. No erythema.  Psychiatric: She has a normal mood and affect. Her behavior is normal.  Nursing note and vitals reviewed.   ED Course  Procedures   DIAGNOSTIC STUDIES:  Oxygen Saturation is 100% on RA, normal by my interpretation.    COORDINATION OF CARE:  11:57 PM Discussed treatment plan with pt at bedside and pt agreed to plan.  Labs Review Labs Reviewed  BASIC METABOLIC PANEL - Abnormal; Notable for the following:    Sodium 132 (*)    Chloride 99 (*)    CO2 10 (*)    Glucose, Bld 367 (*)    BUN 24 (*)    Creatinine, Ser 1.37 (*)     Calcium 8.8 (*)    GFR calc non Af Amer 50 (*)    GFR calc Af Amer 58 (*)    Anion gap 23 (*)    All other components within normal limits  CBC - Abnormal; Notable for the following:    Hemoglobin 16.1 (*)    All other components within normal limits  URINALYSIS, ROUTINE W REFLEX MICROSCOPIC (NOT AT [facility]) - Abnormal; Notable for the following:    Color, Urine STRAW (*)    Glucose, UA >1000 (*)    Ketones, ur >80 (*)    All other components within normal limits  URINE MICROSCOPIC-ADD ON - Abnormal; Notable for the following:    Squamous Epithelial / LPF 0-5 (*)    Bacteria, UA MANY (*)    Casts HYALINE CASTS (*)    All other components within normal limits  HEPATIC FUNCTION PANEL - Abnormal; Notable for the following:    Total Bilirubin 2.3 (*)    Indirect Bilirubin 2.1 (*)    All other components within normal limits  CBG MONITORING, ED - Abnormal; Notable for the following:    Glucose-Capillary 389 (*)    All other components within normal limits  CBG MONITORING, ED - Abnormal; Notable for the following:    Glucose-Capillary 254 (*)    All other components within normal limits  CBG MONITORING, ED - Abnormal; Notable for the following:    Glucose-Capillary 221 (*)    All other components within normal limits  LIPASE, BLOOD  BASIC METABOLIC PANEL  BASIC METABOLIC PANEL  BASIC METABOLIC PANEL  BASIC METABOLIC PANEL  BASIC METABOLIC PANEL  MAGNESIUM  BETA-HYDROXYBUTYRIC ACID  I-STAT BETA HCG BLOOD, ED ([facility], [facility], [facility] ONLY)    Imaging Review Dg Chest 2 View  09/25/2015  CLINICAL DATA:  Thoracic pain.  Hyperglycemia.  Nausea and vomiting. EXAM: CHEST  2 VIEW COMPARISON:  07/25/2015 FINDINGS: Borderline hyperinflation and central bronchitic change. The cardiomediastinal contours are normal. Pulmonary vasculature is normal. No consolidation, pleural effusion, or pneumothorax. No acute osseous abnormalities are seen. IMPRESSION: Borderline hyperinflation and minimal central bronchitic  change. Electronically Signed   By: Jeb Levering M.D.   On: 09/25/2015 01:24   I have personally reviewed and evaluated these images and lab results as part of my medical decision-making.   EKG Interpretation None      MDM   Final diagnoses:  Diabetic ketoacidosis without coma associated with type 1 diabetes mellitus ([place])  Dehydration    I personally performed the services described in this documentation, which was scribed in my presence. The recorded information has been reviewed and is accurate.   Patient is dry appearing. Abdominal exam is benign. She has a normal neurologic exam. Ketonuria and metabolic acidosis with anion gap. We'll treat for DKA. Given IV fluids and started on insulin drip in the emergency department. We'll discuss with family medicine service as far as admission.   discussed with resident will admit to Dr. Erin Hearing to stepdown unit.  Julianne Rice, MD 09/25/15 [phone]

## 2015-09-24 NOTE — ED Notes (Signed)
PER EMS: pt from home with c/o hyperglycemia. She checked her blood sugar and it was 507 and so she took 25 units of Novolin. EMS adm 500 cc of normal saline, last CBG was 407. Pt A&Ox4 and reports headache/nausea and emesis. BP: 122/77, HR-97, RR-16, O2-97% RA.

## 2015-09-25 ENCOUNTER — Emergency Department (HOSPITAL_COMMUNITY): Payer: Self-pay

## 2015-09-25 DIAGNOSIS — E1010 Type 1 diabetes mellitus with ketoacidosis without coma: Principal | ICD-10-CM

## 2015-09-25 DIAGNOSIS — K219 Gastro-esophageal reflux disease without esophagitis: Secondary | ICD-10-CM

## 2015-09-25 DIAGNOSIS — E860 Dehydration: Secondary | ICD-10-CM | POA: Insufficient documentation

## 2015-09-25 LAB — BASIC METABOLIC PANEL
ANION GAP: 8 (ref 5–15)
Anion gap: 14 (ref 5–15)
Anion gap: 23 — ABNORMAL HIGH (ref 5–15)
Anion gap: 7 (ref 5–15)
BUN: 11 mg/dL (ref 6–20)
BUN: 13 mg/dL (ref 6–20)
BUN: 18 mg/dL (ref 6–20)
BUN: 24 mg/dL — ABNORMAL HIGH (ref 6–20)
CALCIUM: 8 mg/dL — AB (ref 8.9–10.3)
CALCIUM: 8.8 mg/dL — AB (ref 8.9–10.3)
CHLORIDE: 108 mmol/L (ref 101–111)
CO2: 10 mmol/L — ABNORMAL LOW (ref 22–32)
CO2: 14 mmol/L — ABNORMAL LOW (ref 22–32)
CO2: 18 mmol/L — AB (ref 22–32)
CO2: 20 mmol/L — ABNORMAL LOW (ref 22–32)
CREATININE: 1.37 mg/dL — AB (ref 0.44–1.00)
Calcium: 7.5 mg/dL — ABNORMAL LOW (ref 8.9–10.3)
Calcium: 7.9 mg/dL — ABNORMAL LOW (ref 8.9–10.3)
Chloride: 103 mmol/L (ref 101–111)
Chloride: 107 mmol/L (ref 101–111)
Chloride: 99 mmol/L — ABNORMAL LOW (ref 101–111)
Creatinine, Ser: 1.03 mg/dL — ABNORMAL HIGH (ref 0.44–1.00)
Creatinine, Ser: 0.66 mg/dL (ref 0.44–1.00)
Creatinine, Ser: 0.85 mg/dL (ref 0.44–1.00)
GFR calc Af Amer: >60 mL/min (ref >60)
GFR calc Af Amer: >60 mL/min (ref >60)
GFR calc Af Amer: >60 mL/min (ref >60)
GFR calc non Af Amer: >60 mL/min (ref >60)
GFR calc non Af Amer: >60 mL/min (ref >60)
GFR, EST AFRICAN AMERICAN: 58 mL/min — AB (ref >60)
GFR, EST NON AFRICAN AMERICAN: 50 mL/min — AB (ref >60)
GLUCOSE: 304 mg/dL — AB (ref 65–99)
GLUCOSE: 176 mg/dL — AB (ref 65–99)
GLUCOSE: 250 mg/dL — AB (ref 65–99)
Glucose, Bld: 367 mg/dL — ABNORMAL HIGH (ref 65–99)
POTASSIUM: 3.8 mmol/L (ref 3.5–5.1)
POTASSIUM: 3.3 mmol/L — AB (ref 3.5–5.1)
Potassium: 3.5 mmol/L (ref 3.5–5.1)
Potassium: 4.5 mmol/L (ref 3.5–5.1)
SODIUM: 131 mmol/L — AB (ref 135–145)
SODIUM: 134 mmol/L — AB (ref 135–145)
SODIUM: 132 mmol/L — AB (ref 135–145)
Sodium: 134 mmol/L — ABNORMAL LOW (ref 135–145)

## 2015-09-25 LAB — GLUCOSE, CAPILLARY
GLUCOSE-CAPILLARY: 167 mg/dL — AB (ref 65–99)
GLUCOSE-CAPILLARY: 106 mg/dL — AB (ref 65–99)
Glucose-Capillary: 154 mg/dL — ABNORMAL HIGH (ref 65–99)
Glucose-Capillary: 228 mg/dL — ABNORMAL HIGH (ref 65–99)
Glucose-Capillary: 269 mg/dL — ABNORMAL HIGH (ref 65–99)
Glucose-Capillary: 196 mg/dL — ABNORMAL HIGH (ref 65–99)
Glucose-Capillary: 103 mg/dL — ABNORMAL HIGH (ref 65–99)
Glucose-Capillary: 102 mg/dL — ABNORMAL HIGH (ref 65–99)

## 2015-09-25 LAB — CBG MONITORING, ED
GLUCOSE-CAPILLARY: 221 mg/dL — AB (ref 65–99)
GLUCOSE-CAPILLARY: 309 mg/dL — AB (ref 65–99)
GLUCOSE-CAPILLARY: 240 mg/dL — AB (ref 65–99)
Glucose-Capillary: 254 mg/dL — ABNORMAL HIGH (ref 65–99)
Glucose-Capillary: 212 mg/dL — ABNORMAL HIGH (ref 65–99)

## 2015-09-25 LAB — HEPATIC FUNCTION PANEL
ALK PHOS: 101 U/L (ref 38–126)
ALT: 16 U/L (ref 14–54)
AST: 15 U/L (ref 15–41)
Albumin: 3.8 g/dL (ref 3.5–5.0)
BILIRUBIN DIRECT: 0.2 mg/dL (ref 0.1–0.5)
BILIRUBIN TOTAL: 2.3 mg/dL — AB (ref 0.3–1.2)
Indirect Bilirubin: 2.1 mg/dL — ABNORMAL HIGH (ref 0.3–0.9)
TOTAL PROTEIN: 7.3 g/dL (ref 6.5–8.1)

## 2015-09-25 LAB — CBC
HCT: 46 % (ref 36.0–46.0)
Hemoglobin: 16.1 g/dL — ABNORMAL HIGH (ref 12.0–15.0)
MCH: 32.7 pg (ref 26.0–34.0)
MCHC: 35 g/dL (ref 30.0–36.0)
MCV: 93.5 fL (ref 78.0–100.0)
PLATELETS: 287 10*3/uL (ref 150–400)
RBC: 4.92 MIL/uL (ref 3.87–5.11)
RDW: 13.2 % (ref 11.5–15.5)
WBC: 8.5 10*3/uL (ref 4.0–10.5)

## 2015-09-25 LAB — I-STAT BETA HCG BLOOD, ED (MC, WL, AP ONLY)

## 2015-09-25 LAB — BETA-HYDROXYBUTYRIC ACID: Beta-Hydroxybutyric Acid: 3.98 mmol/L — ABNORMAL HIGH (ref 0.05–0.27)

## 2015-09-25 LAB — MAGNESIUM: Magnesium: 1.6 mg/dL — ABNORMAL LOW (ref 1.7–2.4)

## 2015-09-25 LAB — LIPASE, BLOOD: Lipase: 20 U/L (ref 11–51)

## 2015-09-25 MED ORDER — DEXTROSE-NACL 5-0.45 % IV SOLN
INTRAVENOUS | Status: DC
  Administered 2015-09-25: 05:00:00 via INTRAVENOUS

## 2015-09-25 MED ORDER — SODIUM CHLORIDE 0.9 % IV BOLUS (SEPSIS)
1000.0000 mL | Freq: Once | INTRAVENOUS | Status: AC
  Administered 2015-09-25: 1000 mL via INTRAVENOUS

## 2015-09-25 MED ORDER — IBUPROFEN 600 MG PO TABS
600.0000 mg | ORAL_TABLET | Freq: Once | ORAL | Status: AC
  Administered 2015-09-25: 600 mg via ORAL
  Filled 2015-09-25: qty 1

## 2015-09-25 MED ORDER — INSULIN ASPART 100 UNIT/ML ~~LOC~~ SOLN: 0.0000 [IU] | Freq: Three times a day (TID) | SUBCUTANEOUS | Status: DC

## 2015-09-25 MED ORDER — SODIUM CHLORIDE 0.9 % IV SOLN: INTRAVENOUS | Status: DC

## 2015-09-25 MED ORDER — SODIUM CHLORIDE 0.9 % IV SOLN
INTRAVENOUS | Status: DC
  Administered 2015-09-25: 1.6 [IU]/h via INTRAVENOUS
  Filled 2015-09-25: qty 2.5

## 2015-09-25 MED ORDER — INSULIN REGULAR BOLUS VIA INFUSION
0.0000 [IU] | Freq: Three times a day (TID) | INTRAVENOUS | Status: DC
  Filled 2015-09-25: qty 10

## 2015-09-25 MED ORDER — POTASSIUM CHLORIDE 10 MEQ/100ML IV SOLN
10.0000 meq | INTRAVENOUS | Status: DC
  Administered 2015-09-25: 10 meq via INTRAVENOUS
  Filled 2015-09-25: qty 100

## 2015-09-25 MED ORDER — SODIUM CHLORIDE 0.9 % IV SOLN
INTRAVENOUS | Status: AC
  Administered 2015-09-25: 05:00:00 via INTRAVENOUS

## 2015-09-25 MED ORDER — INSULIN ASPART 100 UNIT/ML ~~LOC~~ SOLN: 0.0000 [IU] | Freq: Every day | SUBCUTANEOUS | Status: DC

## 2015-09-25 MED ORDER — SODIUM CHLORIDE 0.9 % IV SOLN
INTRAVENOUS | Status: DC
  Filled 2015-09-25: qty 2.5

## 2015-09-25 MED ORDER — ENOXAPARIN SODIUM 40 MG/0.4ML ~~LOC~~ SOLN
40.0000 mg | SUBCUTANEOUS | Status: DC
  Filled 2015-09-25 (×3): qty 0.4

## 2015-09-25 MED ORDER — DEXTROSE-NACL 5-0.45 % IV SOLN
INTRAVENOUS | Status: DC
  Administered 2015-09-25: 03:00:00 via INTRAVENOUS

## 2015-09-25 MED ORDER — DEXTROSE 50 % IV SOLN: 25.0000 mL | INTRAVENOUS | Status: DC | PRN

## 2015-09-25 MED ORDER — INSULIN GLARGINE 100 UNIT/ML ~~LOC~~ SOLN
10.0000 [IU] | Freq: Every day | SUBCUTANEOUS | Status: DC
  Administered 2015-09-25: 10 [IU] via SUBCUTANEOUS
  Filled 2015-09-25: qty 0.1

## 2015-09-25 MED ORDER — POTASSIUM CHLORIDE 10 MEQ/100ML IV SOLN
10.0000 meq | INTRAVENOUS | Status: AC
  Administered 2015-09-25: 10 meq via INTRAVENOUS
  Filled 2015-09-25: qty 100

## 2015-09-25 MED ORDER — ACETAMINOPHEN 325 MG PO TABS
325.0000 mg | ORAL_TABLET | Freq: Once | ORAL | Status: AC
  Administered 2015-09-25: 325 mg via ORAL
  Filled 2015-09-25: qty 1

## 2015-09-25 NOTE — ED Notes (Signed)
CBG 221 

## 2015-09-25 NOTE — Progress Notes (Signed)
Patient and her female companion were asking for methat would put her into sleep,they were mad becouse they been asking that kind of medicines last night.Patient just arrived at 6 in the morning from E.D.Nurse apologized and explained and tell them that we will let the doctor aware.Female companion said ''that's all you said here,call the doctor,if you are not able to give him something ,ill get something for her,she doesn't have any single minute of sleeping since we come on this hospital"
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
## 2015-09-25 NOTE — ED Notes (Signed)
Attempted report 

## 2015-09-25 NOTE — Progress Notes (Signed)
Patient signed AMA.M.D notified.Discharged on stable condition.

## 2015-09-25 NOTE — Progress Notes (Signed)
Called by [employer],all leads were off.Checked and found patient removed her telemetry and started to wear her street clothes.R.N asked why? Patient i .m going homeNurse explained to her that she is still on glucomander.Patient said ''sooner  and you have been checking my blood every 30 minutes.Nurse corrected her by saying ''every hour.Will made M.D.aware.

## 2015-09-25 NOTE — ED Notes (Signed)
Spoke to  Dr.Schmitz regarding bed placement, pt is stable for a telemetry bed; bed request changed

## 2015-09-25 NOTE — Progress Notes (Signed)
Four successive blood sugar reading met within the target area.Will discontinue from [place].

## 2015-09-25 NOTE — Discharge Summary (Signed)
[facility] Discharge Summary  Patient name: Kristine Allison Medical record number: [id number] Date of birth: Feb 19, 1981 Age: 35 y.o. Gender: female Date of Admission: 09/24/2015  Date of Discharge: 09/25/2015 (left AMA) Admitting Physician: Lind Covert, MD  Primary Care Provider: Andrena Mews, MD Consultants: non  Indication for Hospitalization: DKA  Discharge Diagnoses/Problem List:  Active Problems:   DKA (diabetic ketoacidoses) [facility])  Disposition: Patient left AMA  Discharge Condition: stable. Left AMA.    Discharge Exam: please see morning progress note  Brief Hospital Course:   Kristine Allison is a 35 y.o. female that presented with DKA. PMH is significant for type 1 DM and tobacco abuse  On admission, patient had an anion gap of 23.  Electrolytes and vitals were stable. On exam, she appeared mildly dehydrated.  She was placed on IVFs and an insulin drip.  Her electrolytes and anion gap were monitored.  About 1 hour into her bridge with sub-q insulin, patient decided that she wanted to leave AMA.  Patient was informed of the risks of leaving against medical advise and was advised to stay for further observation and management.  She declined.  Her anion gap had been closed x2 and she was bridged with sub-q insulin before she left AMA.    Issues for Follow Up:  1. Blood glucose, medication compliance  Significant Procedures: none  Significant Labs and Imaging:   Recent Labs Lab 09/24/15 2351  WBC 8.5  HGB 16.1*  HCT 46.0  PLT 287    Recent Labs Lab 09/24/15 2351 09/25/15 0403 09/25/15 0725 09/25/15 1101  NA 132* 131* 134* 134*  K 4.5 3.8 3.5 3.3*  CL 99* 103 107 108  CO2 10* 14* 20* 18*  GLUCOSE 367* 304* 176* 250*  BUN 24* '18 13 11  ' CREATININE 1.37* 1.03* 0.66 0.85  CALCIUM 8.8* 8.0* 7.5* 7.9*  MG  --  1.6*  --   --   ALKPHOS 101  --   --   --   AST 15  --   --   --   ALT 16  --   --   --   ALBUMIN  3.8  --   --   --    Dg Chest 2 View  09/25/2015  CLINICAL DATA:  Thoracic pain.  Hyperglycemia.  Nausea and vomiting. EXAM: CHEST  2 VIEW COMPARISON:  07/25/2015 FINDINGS: Borderline hyperinflation and central bronchitic change. The cardiomediastinal contours are normal. Pulmonary vasculature is normal. No consolidation, pleural effusion, or pneumothorax. No acute osseous abnormalities are seen. IMPRESSION: Borderline hyperinflation and minimal central bronchitic change. Electronically Signed   By: Jeb Levering M.D.   On: 09/25/2015 01:24   Results/Tests Pending at Time of Discharge: none  Discharge Medications:    Medication List    ASK your doctor about these medications        acetaminophen 325 MG tablet  Commonly known as:  TYLENOL  Take 2 tablets (650 mg total) by mouth every 6 (six) hours as needed for mild pain (or Fever >/= 101).     albuterol 108 (90 Base) MCG/ACT inhaler  Commonly known as:  PROVENTIL HFA;VENTOLIN HFA  Inhale 2 puffs into the lungs every 6 (six) hours as needed for wheezing or shortness of breath.     aspirin 81 MG EC tablet  Take 1 tablet (81 mg total) by mouth daily.     blood glucose meter kit and supplies  Dispense based on patient and  insurance preference. Use up to four times daily as directed. (FOR ICD-9 250.00, 250.01).     glucose blood test strip  Use as instructed     glucose monitoring kit monitoring kit  1 each by Does not apply route as needed for other.     insulin NPH-regular Human (70-30) 100 UNIT/ML injection  Commonly known as:  NOVOLIN 70/30  Inject 20 Units into the skin 2 (two) times daily with a meal.     levofloxacin 500 MG tablet  Commonly known as:  LEVAQUIN  Take 1 tablet (500 mg total) by mouth daily.     onetouch ultrasoft lancets  Use as instructed     traMADol 50 MG tablet  Commonly known as:  ULTRAM  Take 1 tablet (50 mg total) by mouth every 6 (six) hours as needed.        Discharge Instructions: Please  refer to Patient Instructions section of EMR for full details.  Patient was counseled important signs and symptoms that should prompt return to medical care, changes in medications, dietary instructions, activity restrictions, and follow up appointments.   Follow-Up Appointments:  Left AMA before could be scheduled.  Janora Norlander, DO 09/25/2015, 5:01 PM PGY-2, [place]

## 2015-09-25 NOTE — ED Notes (Signed)
Pt requesting medication to help her sleep, also c/o headache unrelieved with Tylenol. MD paged

## 2015-09-25 NOTE — Progress Notes (Signed)
Nurse saw patient drinks from [employer] cup brought in by her female companion,remionded patient that she is NPO.Both of them reasoning out that she is allowed to drink.Patient also reminded to keep her left arm semi-straight so that insulin and i.v. fluid would continue infusing without interruption.Patient answer irritably that i could not help it".Offered to have new i.v site,she refused.

## 2015-09-25 NOTE — Progress Notes (Signed)
Text paged md

## 2015-09-25 NOTE — Progress Notes (Addendum)
Inpatient Diabetes Program Recommendations  AACE/ADA: New Consensus Statement on Inpatient Glycemic Control (2015)  Target Ranges:  Prepandial:   less than 140 mg/dL      Peak postprandial:   less than 180 mg/dL (1-2 hours)      Critically ill patients:  140 - 180 mg/dL    Results for Kristine Allison, Kristine Allison (MRN [id number]) as of 09/25/2015 10:37  Ref. Range 09/24/2015 23:51  Sodium Latest Ref Range: 135-145 mmol/L 132 (L)  Potassium Latest Ref Range: 3.5-5.1 mmol/L 4.5  Chloride Latest Ref Range: 101-111 mmol/L 99 (L)  CO2 Latest Ref Range: 22-32 mmol/L 10 (L)  BUN Latest Ref Range: 6-20 mg/dL 24 (H)  Creatinine Latest Ref Range: 0.44-1.00 mg/dL 1.37 (H)  Calcium Latest Ref Range: 8.9-10.3 mg/dL 8.8 (L)  EGFR (Non-African Amer.) Latest Ref Range: >60 mL/min 50 (L)  EGFR (African American) Latest Ref Range: >60 mL/min 58 (L)  Glucose Latest Ref Range: 65-99 mg/dL 367 (H)  Anion gap Latest Ref Range: 5-15  23 (H)    Admit with: DKA, Has been ill at home for 4 days  History: DM  Home DM Meds: 70/30 insulin- 20 units bidwc  Current Insulin Orders: IV Insulin drip       Lantus 10 units daily       Novolog Sensitive SSI (0-9 units) TID AC + HS     -Note BMET shows improvement this AM- CO2 up to 20 and Anion Gap down to 7.  -Note SQ Lantus and Novolog orders placed this AM for transition off IV insulin drip when glucose levels have stabilized.  -DM Coordinator spoke with this patient extensively on 07/26/15 about her home DM regimen.  Per notes, patient was very disinterested in her DM care at home.  -Spoke with RN Gwenlyn Perking about Lantus and Novolog orders.  Reminded RN to make sure to give Lantus 1-2 hours before IV insulin drip stopped (when MD gives OK to stop insulin drip).    --Will follow patient during hospitalization--  Wyn Quaker RN, MSN, CDE Diabetes Coordinator Inpatient Glycemic Control Team Team Pager: [phone] (8a-5p)

## 2015-09-25 NOTE — Progress Notes (Signed)
Utilization review completed. Jamea Robicheaux, RN, BSN. 

## 2015-09-25 NOTE — Progress Notes (Addendum)
FPTS Interim Progress Note  S: Patient reports that she "feels fine but is tired because she can't get sleep in this place".  Denies concerns at this time.  She takes 25 units of insulin THREE TIMES A DAY at home.  O: BP 96/50 mmHg  Pulse 89  Temp(Src) 98.5 F (36.9 C) (Oral)  Resp 16  Ht 5\' 4"  (1.626 m)  Wt 115 lb 8 oz (52.39 kg)  BMI 19.82 kg/m2  SpO2 100%  Gen: sleeping in bed, boyfriend at bedside Cardio: regular rate noted on tele Psych: mood irate, speech normal CBG (last 3)   Recent Labs  09/25/15 0743 09/25/15 0855 09/25/15 1000  GLUCAP 154* 228* 269*    A/P: Kristine Allison is a 35 y.o. female here for DKA.  BG now <200 and AG closed x2. - Bridge with long acting insulin, SSI sens ordered - Continue to monitor BG - Will adjust insulin as able. - Will space BMP to twice a day x2, then daily. - Continue fluids.  Raliegh IpAshly M Myishia Kasik, DO 09/25/2015, 10:34 AM PGY-2, [facility] pager [phone]

## 2015-09-25 NOTE — H&P (Signed)
[facility] Admission History and Physical Service Pager: [phone]  Patient name: Kristine Allison Medical record number: [id number] Date of birth: [birth year] Age: 35 y.o. Gender: female  Primary Care Provider: Andrena Mews, MD Consultants: none Code Status: Full   Chief Complaint: hyperglycemia   Assessment and Plan: Kristine Allison is a 35 y.o. female presenting with DKA. PMH is significant for type 1 DM and tobacco abuse.   Diabetic ketoacidosis secondary to poorly controlled diabetes: History of noncompliance and reports having limited by mouth intake over the past 4 days. Reports taking her insulin today. She denies any chest pain but does endorse shortness of breath that has resolved. Denies any fevers or other infectious causes. Having normal mentation but with ketones in the urine and elevated anion gap to 23. - Admitted to step down, Dr. Erin Hearing attending - Continue insulin drip per protocol - VS per floor  - transition off insulin drip once gap has closed  - BMP Q4   Acute kidney injury: Creatinine with a baseline roughly between 0.7 0.8 and presents today with a creatinine of 1.37. Most likely related to limited by mouth intake and dehydration. - Replenish with fluids  Tobacco dependence: Chest x-ray showing borderline hyperinflation which would suggest beginnings of COPD. - Continue counseling  FEN/GI: Fluids for insulin drip protocol, nothing by mouth Prophylaxis: Lovenox  Disposition: Admitted to stepdown for diabetic ketoacidosis  History of Present Illness:  Kristine Allison is a 35 y.o. female presenting with DKA. She has been feeling ill for the past 4 days. Having some nausea and only occasional vomiting. Denies any fevers or chills during this time. Has had sick contacts. She has had limited PO intake during this time as she has not felt like eating.  She has a history of non compliance with her medication with  several admissions for DKA.  She reports that this feels similar to prior episodes. She denies any chest pain, shortness of breath, abdominal pain or dizziness.  She is having some urine output and normal bowel movements.  She took her insulin today once EMS arrived at her house. She was having some shortness of breath today as to what prompted her to seek immediate care. The dyspnea resolved once oxygen was applied by EMS and she has not had any further symptoms.   Review Of Systems: Per HPI with the following additions: See history of present illness Otherwise the remainder of the systems were negative.  Patient Active Problem List   Diagnosis Date Noted  . Dehydration   . Esophageal reflux   . Diabetic ketoacidosis without coma associated with type 1 diabetes mellitus ([place])   . DKA (diabetic ketoacidoses) ([place]) 05/03/2015  . Yeast infection 02/09/2015  . Acute renal failure syndrome ([place])   . Tachycardia 11/03/2014  . Insomnia 09/08/2014  . Dizziness 09/08/2014  . Migraine, unspecified 12/14/2013  . Elevated troponin I level 11/19/2013  . Acute myocardial infarction, subendocardial infarction, initial episode of care ([place]) 11/18/2013  . Noncompliance with medication regimen 11/15/2013  . Leg pain 09/08/2013  . Loss of weight 09/08/2013  . Depression 09/08/2013  . Hypotension, unspecified 09/08/2013  . Tobacco use disorder 08/01/2013  . Diabetes mellitus type 1, uncontrolled ([place]) 06/24/2012  . GERD (gastroesophageal reflux disease) 06/17/2012    Past Medical History: Past Medical History  Diagnosis Date  . Asthma   . Diabetes mellitus without complication ([place])   . Headache(784.0)   . Anxiety     Past  Surgical History: Past Surgical History  Procedure Laterality Date  . No past surgeries      Social History: Social History  Substance Use Topics  . Smoking status: Current Every Day Smoker -- 0.25 packs/day for 20 years    Types: Cigarettes  . Smokeless tobacco:  Never Used  . Alcohol Use: No   Additional social history: Tobacco abuse, no alcohol or illicit drug use  Please also refer to relevant sections of EMR.  Family History: Family History  Problem Relation Age of Onset  . Heart disease      No family history    Allergies and Medications: Allergies  Allergen Reactions  . Latex Rash   No current facility-administered medications on file prior to encounter.   Current Outpatient Prescriptions on File Prior to Encounter  Medication Sig Dispense Refill  . aspirin EC 81 MG EC tablet Take 1 tablet (81 mg total) by mouth daily. 30 tablet 11  . insulin NPH-regular Human (NOVOLIN 70/30) (70-30) 100 UNIT/ML injection Inject 20 Units into the skin 2 (two) times daily with a meal. 10 mL 2  . acetaminophen (TYLENOL) 325 MG tablet Take 2 tablets (650 mg total) by mouth every 6 (six) hours as needed for mild pain (or Fever >/= 101). (Patient not taking: Reported on 09/24/2015)    . albuterol (PROVENTIL HFA;VENTOLIN HFA) 108 (90 BASE) MCG/ACT inhaler Inhale 2 puffs into the lungs every 6 (six) hours as needed for wheezing or shortness of breath. (Patient not taking: Reported on 09/24/2015) 1 Inhaler 2  . blood glucose meter kit and supplies Dispense based on patient and insurance preference. Use up to four times daily as directed. (FOR ICD-9 250.00, 250.01). 1 each 0  . glucose blood test strip Use as instructed 100 each 12  . glucose monitoring kit (FREESTYLE) monitoring kit 1 each by Does not apply route as needed for other. 1 each 1  . Lancets (ONETOUCH ULTRASOFT) lancets Use as instructed 100 each 12  . levofloxacin (LEVAQUIN) 500 MG tablet Take 1 tablet (500 mg total) by mouth daily. (Patient not taking: Reported on 09/24/2015) 5 tablet 0  . traMADol (ULTRAM) 50 MG tablet Take 1 tablet (50 mg total) by mouth every 6 (six) hours as needed. (Patient not taking: Reported on 09/24/2015) 12 tablet 0    Objective: BP 93/68 mmHg  Pulse 91  Temp(Src) 98.6 F  (37 C) (Oral)  Resp 18  Ht _0  (1.626 m)  SpO2 100% Exam: General: Well-appearing, no acute distress Eyes: Clear conjunctiva ENTM: Tacky mucous membranes Neck: Supple Cardiovascular: Regular rate and rhythm, S1-S2, no murmurs, rubs, gallops Respiratory: Clear to auscultation bilaterally, normal effort, no wheezing or crackles Abdomen: Soft, nontender, nondistended, positive bowel sounds, no rebound or guarding MSK: Moves all freely Skin: No rashes Neuro: Alert and oriented Psych: No affect  Labs and Imaging: CBC BMET   Recent Labs Lab 09/24/15 2351  WBC 8.5  HGB 16.1*  HCT 46.0  PLT 287    Recent Labs Lab 09/24/15 2351  NA 132*  K 4.5  CL 99*  CO2 10*  BUN 24*  CREATININE 1.37*  GLUCOSE 367*  CALCIUM 8.8*     Urinalysis    Component Value Date/Time   COLORURINE STRAW* 09/24/2015 2306   APPEARANCEUR CLEAR 09/24/2015 2306   LABSPEC 1.024 09/24/2015 2306   PHURINE 5.0 09/24/2015 2306   GLUCOSEU >1000* 09/24/2015 2306   HGBUR NEGATIVE 09/24/2015 2306   BILIRUBINUR NEGATIVE 09/24/2015 2306   KETONESUR >80*  09/24/2015 2306   PROTEINUR NEGATIVE 09/24/2015 2306   UROBILINOGEN 0.2 07/25/2015 1418   NITRITE NEGATIVE 09/24/2015 2306   LEUKOCYTESUR NEGATIVE 09/24/2015 2306   Chest x-ray showing borderline hyperinflation   Rosemarie Ax, MD 09/25/2015, 3:08 AM PGY-3, [facility] pager: [phone], text pages welcome

## 2015-10-02 ENCOUNTER — Emergency Department: Payer: Self-pay

## 2015-10-02 ENCOUNTER — Other Ambulatory Visit: Payer: Self-pay

## 2015-10-02 ENCOUNTER — Encounter: Payer: Self-pay | Admitting: *Deleted

## 2015-10-02 ENCOUNTER — Inpatient Hospital Stay
Admission: EM | Admit: 2015-10-02 | Discharge: 2015-10-03 | DRG: 638 | Disposition: A | Discharge status: Home / Self Care | Payer: Self-pay | Attending: Internal Medicine | Admitting: Internal Medicine

## 2015-10-02 DIAGNOSIS — E871 Hypo-osmolality and hyponatremia: Secondary | ICD-10-CM | POA: Diagnosis present

## 2015-10-02 DIAGNOSIS — F419 Anxiety disorder, unspecified: Secondary | ICD-10-CM | POA: Diagnosis present

## 2015-10-02 DIAGNOSIS — J45909 Unspecified asthma, uncomplicated: Secondary | ICD-10-CM | POA: Diagnosis present

## 2015-10-02 DIAGNOSIS — E860 Dehydration: Secondary | ICD-10-CM | POA: Diagnosis present

## 2015-10-02 DIAGNOSIS — D72829 Elevated white blood cell count, unspecified: Secondary | ICD-10-CM | POA: Diagnosis present

## 2015-10-02 DIAGNOSIS — F17210 Nicotine dependence, cigarettes, uncomplicated: Secondary | ICD-10-CM | POA: Diagnosis present

## 2015-10-02 DIAGNOSIS — N289 Disorder of kidney and ureter, unspecified: Secondary | ICD-10-CM

## 2015-10-02 DIAGNOSIS — E1010 Type 1 diabetes mellitus with ketoacidosis without coma: Principal | ICD-10-CM | POA: Diagnosis present

## 2015-10-02 DIAGNOSIS — Z7982 Long term (current) use of aspirin: Secondary | ICD-10-CM

## 2015-10-02 DIAGNOSIS — R05 Cough: Secondary | ICD-10-CM

## 2015-10-02 DIAGNOSIS — I959 Hypotension, unspecified: Secondary | ICD-10-CM | POA: Diagnosis present

## 2015-10-02 DIAGNOSIS — Z794 Long term (current) use of insulin: Secondary | ICD-10-CM

## 2015-10-02 DIAGNOSIS — N179 Acute kidney failure, unspecified: Secondary | ICD-10-CM | POA: Diagnosis present

## 2015-10-02 DIAGNOSIS — Z79899 Other long term (current) drug therapy: Secondary | ICD-10-CM

## 2015-10-02 DIAGNOSIS — R059 Cough, unspecified: Secondary | ICD-10-CM

## 2015-10-02 DIAGNOSIS — Z9114 Patient's other noncompliance with medication regimen: Secondary | ICD-10-CM

## 2015-10-02 LAB — BLOOD GAS, VENOUS
ACID-BASE DEFICIT: 21.8 mmol/L — AB (ref 0.0–2.0)
Bicarbonate: 7.1 mEq/L — ABNORMAL LOW (ref 21.0–28.0)
PATIENT TEMPERATURE: 37
PCO2 VEN: 25 mmHg — AB (ref 44.0–60.0)
PH VEN: 7.06 — AB (ref 7.320–7.430)

## 2015-10-02 LAB — COMPREHENSIVE METABOLIC PANEL
ALT: 18 U/L (ref 14–54)
ANION GAP: 38 — AB (ref 5–15)
AST: 16 U/L (ref 15–41)
Albumin: 4.2 g/dL (ref 3.5–5.0)
Alkaline Phosphatase: 94 U/L (ref 38–126)
BUN: 44 mg/dL — ABNORMAL HIGH (ref 6–20)
CHLORIDE: 80 mmol/L — AB (ref 101–111)
CO2: 7 mmol/L — AB (ref 22–32)
Calcium: 9.2 mg/dL (ref 8.9–10.3)
Creatinine, Ser: 2.25 mg/dL — ABNORMAL HIGH (ref 0.44–1.00)
GFR, EST AFRICAN AMERICAN: 32 mL/min — AB (ref >60)
GFR, EST NON AFRICAN AMERICAN: 27 mL/min — AB (ref >60)
Glucose, Bld: 1005 mg/dL (ref 65–99)
Potassium: 6.5 mmol/L — ABNORMAL HIGH (ref 3.5–5.1)
SODIUM: 125 mmol/L — AB (ref 135–145)
Total Bilirubin: 1.7 mg/dL — ABNORMAL HIGH (ref 0.3–1.2)
Total Protein: 7.4 g/dL (ref 6.5–8.1)

## 2015-10-02 LAB — URINALYSIS COMPLETE WITH MICROSCOPIC (ARMC ONLY)
BILIRUBIN URINE: NEGATIVE
Bacteria, UA: NONE SEEN
Glucose, UA: >500 mg/dL — AB
LEUKOCYTES UA: NEGATIVE
Nitrite: NEGATIVE
PH: 5 (ref 5.0–8.0)
Protein, ur: 100 mg/dL — AB
SPECIFIC GRAVITY, URINE: 1.016 (ref 1.005–1.030)

## 2015-10-02 LAB — GLUCOSE, CAPILLARY
GLUCOSE-CAPILLARY: 120 mg/dL — AB (ref 65–99)
GLUCOSE-CAPILLARY: 123 mg/dL — AB (ref 65–99)
GLUCOSE-CAPILLARY: 142 mg/dL — AB (ref 65–99)
GLUCOSE-CAPILLARY: 150 mg/dL — AB (ref 65–99)
GLUCOSE-CAPILLARY: 201 mg/dL — AB (ref 65–99)
GLUCOSE-CAPILLARY: 240 mg/dL — AB (ref 65–99)
GLUCOSE-CAPILLARY: 415 mg/dL — AB (ref 65–99)
Glucose-Capillary: >600 mg/dL (ref 65–99)
Glucose-Capillary: >600 mg/dL (ref 65–99)
Glucose-Capillary: >600 mg/dL (ref 65–99)
Glucose-Capillary: 311 mg/dL — ABNORMAL HIGH (ref 65–99)
Glucose-Capillary: 253 mg/dL — ABNORMAL HIGH (ref 65–99)
Glucose-Capillary: 170 mg/dL — ABNORMAL HIGH (ref 65–99)
Glucose-Capillary: 148 mg/dL — ABNORMAL HIGH (ref 65–99)
Glucose-Capillary: 211 mg/dL — ABNORMAL HIGH (ref 65–99)

## 2015-10-02 LAB — BASIC METABOLIC PANEL
ANION GAP: 14 (ref 5–15)
ANION GAP: 7 (ref 5–15)
BUN: 40 mg/dL — ABNORMAL HIGH (ref 6–20)
BUN: 27 mg/dL — ABNORMAL HIGH (ref 6–20)
CALCIUM: 8.2 mg/dL — AB (ref 8.9–10.3)
CALCIUM: 7 mg/dL — AB (ref 8.9–10.3)
CO2: 22 mmol/L (ref 22–32)
CO2: 20 mmol/L — ABNORMAL LOW (ref 22–32)
CREATININE: 0.74 mg/dL (ref 0.44–1.00)
Chloride: 92 mmol/L — ABNORMAL LOW (ref 101–111)
Chloride: 107 mmol/L (ref 101–111)
Creatinine, Ser: 1.17 mg/dL — ABNORMAL HIGH (ref 0.44–1.00)
GFR calc Af Amer: >60 mL/min (ref >60)
GFR calc non Af Amer: >60 mL/min (ref >60)
GLUCOSE: 224 mg/dL — AB (ref 65–99)
GLUCOSE: 144 mg/dL — AB (ref 65–99)
Potassium: 4.2 mmol/L (ref 3.5–5.1)
Potassium: 3.8 mmol/L (ref 3.5–5.1)
Sodium: 128 mmol/L — ABNORMAL LOW (ref 135–145)
Sodium: 134 mmol/L — ABNORMAL LOW (ref 135–145)

## 2015-10-02 LAB — CBC
HCT: 45.6 % (ref 35.0–47.0)
Hemoglobin: 13.7 g/dL (ref 12.0–16.0)
MCH: 31.3 pg (ref 26.0–34.0)
MCHC: 30 g/dL — AB (ref 32.0–36.0)
MCV: 104.6 fL — ABNORMAL HIGH (ref 80.0–100.0)
PLATELETS: 405 10*3/uL (ref 150–440)
RBC: 4.36 MIL/uL (ref 3.80–5.20)
RDW: 15.2 % — AB (ref 11.5–14.5)
WBC: 17.3 10*3/uL — ABNORMAL HIGH (ref 3.6–11.0)

## 2015-10-02 LAB — MRSA PCR SCREENING: MRSA by PCR: NEGATIVE

## 2015-10-02 MED ORDER — INSULIN ASPART 100 UNIT/ML ~~LOC~~ SOLN: 0.0000 [IU] | Freq: Every day | SUBCUTANEOUS | Status: DC

## 2015-10-02 MED ORDER — HEPARIN SODIUM (PORCINE) 5000 UNIT/ML IJ SOLN: 5000.0000 [IU] | Freq: Three times a day (TID) | INTRAMUSCULAR | Status: DC

## 2015-10-02 MED ORDER — ASPIRIN EC 81 MG PO TBEC
81.0000 mg | DELAYED_RELEASE_TABLET | Freq: Every day | ORAL | Status: DC
  Administered 2015-10-02 – 2015-10-03 (×2): 81 mg via ORAL
  Filled 2015-10-02 (×2): qty 1

## 2015-10-02 MED ORDER — SODIUM CHLORIDE 0.9 % IV SOLN
INTRAVENOUS | Status: DC
  Administered 2015-10-02: 23:00:00 via INTRAVENOUS

## 2015-10-02 MED ORDER — SODIUM CHLORIDE 0.9 % IV SOLN
INTRAVENOUS | Status: DC
  Filled 2015-10-02: qty 2.5

## 2015-10-02 MED ORDER — HEPARIN SODIUM (PORCINE) 5000 UNIT/ML IJ SOLN
5000.0000 [IU] | Freq: Three times a day (TID) | INTRAMUSCULAR | Status: DC
  Administered 2015-10-02 – 2015-10-03 (×3): 5000 [IU] via SUBCUTANEOUS
  Filled 2015-10-02 (×3): qty 1

## 2015-10-02 MED ORDER — SODIUM CHLORIDE 0.9 % IV SOLN
INTRAVENOUS | Status: DC
  Administered 2015-10-02: 10.7 [IU]/h via INTRAVENOUS
  Administered 2015-10-02: 16.2 [IU]/h via INTRAVENOUS
  Administered 2015-10-02: 10.8 [IU]/h via INTRAVENOUS
  Administered 2015-10-02: 2.5 [IU]/h via INTRAVENOUS
  Administered 2015-10-02: 5.4 [IU]/h via INTRAVENOUS
  Filled 2015-10-02 (×2): qty 2.5

## 2015-10-02 MED ORDER — TRAMADOL HCL 50 MG PO TABS
50.0000 mg | ORAL_TABLET | Freq: Once | ORAL | Status: AC
  Administered 2015-10-02: 50 mg via ORAL
  Filled 2015-10-02: qty 1

## 2015-10-02 MED ORDER — SODIUM CHLORIDE 0.9 % IV BOLUS (SEPSIS)
1000.0000 mL | Freq: Once | INTRAVENOUS | Status: AC
  Administered 2015-10-02: 1000 mL via INTRAVENOUS

## 2015-10-02 MED ORDER — INSULIN GLARGINE 100 UNIT/ML ~~LOC~~ SOLN
20.0000 [IU] | SUBCUTANEOUS | Status: DC
  Administered 2015-10-02: 20 [IU] via SUBCUTANEOUS
  Filled 2015-10-02 (×2): qty 0.2

## 2015-10-02 MED ORDER — INSULIN ASPART 100 UNIT/ML ~~LOC~~ SOLN
0.0000 [IU] | Freq: Three times a day (TID) | SUBCUTANEOUS | Status: DC
  Administered 2015-10-03 (×2): 11 [IU] via SUBCUTANEOUS
  Administered 2015-10-03: 2 [IU] via SUBCUTANEOUS
  Filled 2015-10-02 (×2): qty 11
  Filled 2015-10-02: qty 2

## 2015-10-02 MED ORDER — SODIUM CHLORIDE 0.9 % IV SOLN
INTRAVENOUS | Status: DC
  Administered 2015-10-02: 14:00:00 via INTRAVENOUS

## 2015-10-02 MED ORDER — ACETAMINOPHEN 325 MG PO TABS
650.0000 mg | ORAL_TABLET | Freq: Once | ORAL | Status: AC
  Administered 2015-10-02: 650 mg via ORAL

## 2015-10-02 MED ORDER — ACETAMINOPHEN 325 MG PO TABS
650.0000 mg | ORAL_TABLET | Freq: Four times a day (QID) | ORAL | Status: DC | PRN
  Administered 2015-10-02 (×2): 650 mg via ORAL
  Filled 2015-10-02 (×2): qty 2

## 2015-10-02 MED ORDER — ACETAMINOPHEN 325 MG PO TABS
ORAL_TABLET | ORAL | Status: AC
  Administered 2015-10-02: 650 mg via ORAL
  Filled 2015-10-02: qty 2

## 2015-10-02 MED ORDER — INSULIN ASPART 100 UNIT/ML ~~LOC~~ SOLN: 0.0000 [IU] | Freq: Three times a day (TID) | SUBCUTANEOUS | Status: DC

## 2015-10-02 MED ORDER — IBUPROFEN 400 MG PO TABS
400.0000 mg | ORAL_TABLET | Freq: Four times a day (QID) | ORAL | Status: DC | PRN
  Administered 2015-10-02 – 2015-10-03 (×2): 400 mg via ORAL
  Filled 2015-10-02 (×2): qty 1

## 2015-10-02 MED ORDER — DEXTROSE-NACL 5-0.45 % IV SOLN
INTRAVENOUS | Status: DC
  Administered 2015-10-02: 15:00:00 via INTRAVENOUS

## 2015-10-02 NOTE — Progress Notes (Signed)
RN spoke with E Link RN about patient's blood pressure post 2 L normal saline fluid boluses (patient also got another 1 L bolus this morning in the ED). BP still with SBP in 80s and MAPs in 50s. Cuff in good position. Patient alert and oriented but sleeping between care. Patient does report feeling light headed. Patient has voided twice since arrival and was steady on feet for [facility]. RN also mentioned to E Link RN about patient's lab values, with WBC in ED of 17.3, no fevers in ICU, and urinalysis results. E Link RN to speak with Glade NurseE Link MD.

## 2015-10-02 NOTE — Progress Notes (Signed)
RN spoke with E Link RN again who passed on MD message. No new orders at this time for pain medication. RN to continue with prn tyelnol. Patient currently sleeping again, RN will get patient warm blanket for back and neck.

## 2015-10-02 NOTE — ED Notes (Signed)
MD notified of critical glucose of 1005

## 2015-10-02 NOTE — Progress Notes (Signed)
Spoke with Glade NurseE Link MD, Dr. Dema SeverinMungal, about patient's basic metabolic panel results. CO2 22, Anion Gap 14, and potassium 4.2. MD acknowledged, no new orders. DKA Protocal Phase 1 to continue as anion gap 14 (goal 8-12).

## 2015-10-02 NOTE — Care Management (Signed)
[facility]  She was admitted for DKA and left AMA.  [facility] to ICU on insulin drip for DKA. Her glucose was > 1000.  CM to speak with patient as there is no payor.

## 2015-10-02 NOTE — Progress Notes (Signed)
Spoke to Altria GroupE Link RN who will pass along message to Glade NurseE Link MD. Patient complaining of 8/10 neck pain after moving around [facility]. Patient states she fell at home prior to admission and has had neck and back pain since. Earlier back pain appeared to be relieved by tylenol (patient fell back asleep), but too soon for another administration. Patient also has low blood pressure and while alert and oriented, has MAPs in 50s and SBPs in 80 to low 90s. Second 1 L bolus of NS currently infusing. Patient has been sleeping between care primarily.

## 2015-10-02 NOTE — Progress Notes (Signed)
Dr.Willis informed of 1930 lab results gap of 7 and Co2 of 20, orders placed to transition pt off insulin drip placed.  Lantus given at 2119. Also informed MD of pt c/o pain, new order for IBU placed.

## 2015-10-02 NOTE — ED Notes (Signed)
Pt resting in bed, pt anxious and irritable, complaining of back pain, wanting water, asking for warm blankets, and stating "hurry up", pt given water, blankets and MD notified of back pain, TV turned on for pt and lights dimmed, will continue to update pt on plan of care and monitor closely

## 2015-10-02 NOTE — H&P (Signed)
[facility] [place] [facility]   PATIENT NAME: Kristine Allison    MR#:  [id number]  DATE OF BIRTH:  1981-01-10  DATE OF ADMISSION:  10/02/2015  PRIMARY CARE PHYSICIAN: Janit Pagan, MD   REQUESTING/REFERRING PHYSICIAN: Paduchowski  CHIEF COMPLAINT:   Chief Complaint  Patient presents with  . Hyperglycemia    HISTORY OF PRESENT ILLNESS: Kristine Allison  is a 35 y.o. female with a known history of DM,recent and multiple admissions in [place] for DKA- for last 2 days- did not take insulin, as she ran out of it, started having Nausea and vomiting. Noted in ER -with DKA. Denies any other complains.  PAST MEDICAL HISTORY:   Past Medical History  Diagnosis Date  . Asthma   . Diabetes mellitus without complication (HCC)   . Headache(784.0)   . Anxiety     PAST SURGICAL HISTORY: Past Surgical History  Procedure Laterality Date  . No past surgeries      SOCIAL HISTORY:  Social History  Substance Use Topics  . Smoking status: Current Every Day Smoker -- 0.25 packs/day for 20 years    Types: Cigarettes  . Smokeless tobacco: Never Used  . Alcohol Use: No    FAMILY HISTORY:  Family History  Problem Relation Age of Onset  . Heart disease      No family history    DRUG ALLERGIES:  Allergies  Allergen Reactions  . Latex Rash    REVIEW OF SYSTEMS:   CONSTITUTIONAL: No fever, fatigue or weakness.  EYES: No blurred or double vision.  EARS, NOSE, AND THROAT: No tinnitus or ear pain.  RESPIRATORY: No cough, shortness of breath, wheezing or hemoptysis.  CARDIOVASCULAR: No chest pain, orthopnea, edema.  GASTROINTESTINAL: positive for nausea, vomiting, no diarrhea, mild abdominal pain.  GENITOURINARY: No dysuria, hematuria.  ENDOCRINE: No polyuria, nocturia,  HEMATOLOGY: No anemia, easy bruising or bleeding SKIN: No rash or lesion. MUSCULOSKELETAL: No joint pain or arthritis.   NEUROLOGIC: No tingling, numbness, weakness.   PSYCHIATRY: No anxiety or depression.   MEDICATIONS AT HOME:  Prior to Admission medications   Medication Sig Start Date End Date Taking? Authorizing Provider  aspirin EC 81 MG EC tablet Take 1 tablet (81 mg total) by mouth daily. 11/20/13  Yes Nani Ravens, MD  insulin NPH-regular Human (NOVOLIN 70/30) (70-30) 100 UNIT/ML injection Inject 20 Units into the skin 2 (two) times daily with a meal. Patient taking differently: Inject 25 Units into the skin 3 (three) times daily with meals.  07/27/15  Yes Esperanza Sheets, MD  acetaminophen (TYLENOL) 325 MG tablet Take 2 tablets (650 mg total) by mouth every 6 (six) hours as needed for mild pain (or Fever >/= 101). Patient not taking: Reported on 09/24/2015 07/27/15   Esperanza Sheets, MD  albuterol (PROVENTIL HFA;VENTOLIN HFA) 108 (90 BASE) MCG/ACT inhaler Inhale 2 puffs into the lungs every 6 (six) hours as needed for wheezing or shortness of breath. Patient not taking: Reported on 09/24/2015 07/27/15   Esperanza Sheets, MD  levofloxacin (LEVAQUIN) 500 MG tablet Take 1 tablet (500 mg total) by mouth daily. Patient not taking: Reported on 09/24/2015 07/27/15   Esperanza Sheets, MD  traMADol (ULTRAM) 50 MG tablet Take 1 tablet (50 mg total) by mouth every 6 (six) hours as needed. Patient not taking: Reported on 09/24/2015 07/27/15   Esperanza Sheets, MD      PHYSICAL EXAMINATION:   VITAL SIGNS: Blood pressure 83/49,  pulse 105, temperature 98.7 F (37.1 C), temperature source Oral, resp. rate 11, height 5\' 4"  (1.626 m), weight 54.432 kg (120 lb), SpO2 100 %.  GENERAL:  35 y.o.-year-old patient lying in the bed with acute distress due to nausea and vomit.  EYES: Pupils equal, round, reactive to light and accommodation. No scleral icterus. Extraocular muscles intact.  HEENT: Head atraumatic, normocephalic. Oropharynx and nasopharynx clear.  NECK:  Supple, no jugular venous distention. No thyroid enlargement, no tenderness.  LUNGS: Normal breath sounds  bilaterally, no wheezing, rales,rhonchi or crepitation. No use of accessory muscles of respiration.  CARDIOVASCULAR: S1, S2 normal. No murmurs, rubs, or gallops.  ABDOMEN: Soft, nontender, nondistended. Bowel sounds present. No organomegaly or mass.  EXTREMITIES: No pedal edema, cyanosis, or clubbing.  NEUROLOGIC: Cranial nerves II through XII are intact. Muscle strength 5/5 in all extremities. Sensation intact. Gait not checked.  PSYCHIATRIC: The patient is alert and oriented x 3.  SKIN: No obvious rash, lesion, or ulcer.   LABORATORY PANEL:   CBC  Recent Labs Lab 10/02/15 0719  WBC 17.3*  HGB 13.7  HCT 45.6  PLT 405  MCV 104.6*  MCH 31.3  MCHC 30.0*  RDW 15.2*   ------------------------------------------------------------------------------------------------------------------  Chemistries   Recent Labs Lab 10/02/15 0719 10/02/15 1431 10/02/15 1934  NA 125* 128* 134*  K 6.5* 4.2 3.8  CL 80* 92* 107  CO2 7* 22 20*  GLUCOSE 1005* 224* 144*  BUN 44* 40* 27*  CREATININE 2.25* 1.17* 0.74  CALCIUM 9.2 8.2* 7.0*  AST 16  --   --   ALT 18  --   --   ALKPHOS 94  --   --   BILITOT 1.7*  --   --    ------------------------------------------------------------------------------------------------------------------ estimated creatinine clearance is 85.9 mL/min (by C-G formula based on Cr of 0.74). ------------------------------------------------------------------------------------------------------------------ No results for input(s): TSH, T4TOTAL, T3FREE, THYROIDAB in the last 72 hours.  Invalid input(s): FREET3   Coagulation profile No results for input(s): INR, PROTIME in the last 168 hours. ------------------------------------------------------------------------------------------------------------------- No results for input(s): DDIMER in the last 72  hours. -------------------------------------------------------------------------------------------------------------------  Cardiac Enzymes No results for input(s): CKMB, TROPONINI, MYOGLOBIN in the last 168 hours.  Invalid input(s): CK ------------------------------------------------------------------------------------------------------------------ Invalid input(s): POCBNP  ---------------------------------------------------------------------------------------------------------------  Urinalysis    Component Value Date/Time   COLORURINE YELLOW* 10/02/2015 0850   APPEARANCEUR HAZY* 10/02/2015 0850   LABSPEC 1.016 10/02/2015 0850   PHURINE 5.0 10/02/2015 0850   GLUCOSEU >500* 10/02/2015 0850   HGBUR 1+* 10/02/2015 0850   BILIRUBINUR NEGATIVE 10/02/2015 0850   KETONESUR 2+* 10/02/2015 0850   PROTEINUR 100* 10/02/2015 0850   UROBILINOGEN 0.2 07/25/2015 1418   NITRITE NEGATIVE 10/02/2015 0850   LEUKOCYTESUR NEGATIVE 10/02/2015 0850     RADIOLOGY: Dg Chest 2 View  10/02/2015  CLINICAL DATA:  Hyperglycemia and cough EXAM: CHEST - 2 VIEW COMPARISON:  None. FINDINGS: The heart size and mediastinal contours are within normal limits. Both lungs are clear. The visualized skeletal structures are unremarkable. IMPRESSION: No active disease. Electronically Signed   By: Alcide CleverMark  Lukens M.D.   On: 10/02/2015 09:09    EKG: Orders placed or performed during the hospital encounter of 10/02/15  . ED EKG  . ED EKG    IMPRESSION AND PLAN:  * DKA   Admit to ICU- Insulin drip DKA protocol.   Monitor Frequent BMP.   IV fluids.  * Hypotension   Likely dehydration   IV fluid bolus given by ER>   WIll continue IV  fluids and monitor.  * ARF   Due ot dehydration. IV fluids, Monitor.  * Hyponatremia   Pseudo- due to hyperglycemia   Monitor after IV fluids.  * Leukocytosis   Due to DKA likely   Check UA and Xray chest  * smoking   Councelled for 4 min to quit smoking  All the records  are reviewed and case discussed with ED provider. Management plans discussed with the patient, family and they are in agreement.  CODE STATUS:    Code Status Orders        Start     Ordered   10/02/15 1356  Full code   Continuous     10/02/15 1355    Code Status History    Date Active Date Inactive Code Status Order ID Comments User Context   09/25/2015  3:23 AM 09/25/2015  8:20 PM Full Code [id number]  Myra Rude, MD ED   07/25/2015  5:33 PM 07/27/2015  3:25 PM Full Code [id number]  Zannie Cove, MD Inpatient   05/03/2015  3:22 PM 05/05/2015  4:34 PM Full Code [id number]  Onnie Boer, MD ED   02/09/2015 10:56 PM 02/11/2015  4:41 PM Full Code [id number]  Raliegh Ip, DO Inpatient   12/21/2014  8:43 AM 12/23/2014  8:53 PM Full Code [id number]  Bernadene Person, NP ED   08/20/2014  5:43 AM 08/22/2014  7:03 PM Full Code [id number]  Oralia Manis, MD Inpatient   12/02/2013  2:10 AM 12/03/2013  5:00 PM Full Code [id number]  Leona Singleton, MD Inpatient   11/15/2013  3:32 PM 11/20/2013  4:31 PM Full Code [id number]  Jeanella Craze, NP ED   07/26/2013 10:59 PM 07/29/2013  6:30 PM Full Code [id number]  Charm Rings, MD Inpatient   06/05/2013 11:38 PM 06/05/2013 11:38 PM Full Code [id number]  Glori Luis, MD Inpatient   06/05/2013 11:38 PM 06/07/2013  6:22 PM Full Code [id number]  Glori Luis, MD Inpatient       TOTAL TIME TAKING CARE OF THIS PATIENT: 50 critical care minutes.    Altamese Dilling M.D on 10/02/2015   Between 7am to 6pm - Pager - [phone]  After 6pm go to [email or web address] - password [email or web address] [facility]  [facility]  Office  [phone]  CC: Primary care physician; Janit Pagan, MD   Note: This dictation was prepared with Dragon dictation along with smaller phrase technology. Any transcriptional errors that result from this process are unintentional.

## 2015-10-02 NOTE — Progress Notes (Signed)
Spoke to Dr. Belia HemanKasa in person about patient's blood pressure (pt asleep, BP 76/34 MAP 46). MD ordered two one liter boluses of normal saline.

## 2015-10-02 NOTE — ED Provider Notes (Addendum)
[facility] Emergency Department Provider Note  Time seen: 7:30 AM  I have reviewed the triage vital signs and the nursing notes.   HISTORY  Chief Complaint Hyperglycemia    HPI Kristine Allison is a 35 y.o. female with a past medical history of diabetes who presents the emergency department elevated blood sugars. According to the patient she ran out of insulin 2 days ago, has had blood sugars in the 600 range since. Today the patient states she nearly passed out due to lightheadedness so she came to the emergency department. Patient states she feels very thirsty and weak. Denies any pain, does state she feels nauseated.     Past Medical History  Diagnosis Date  . Asthma   . Diabetes mellitus without complication ([place])   . Headache(784.0)   . Anxiety     Patient Active Problem List   Diagnosis Date Noted  . Dehydration   . Esophageal reflux   . Diabetic ketoacidosis without coma associated with type 1 diabetes mellitus ([place])   . DKA (diabetic ketoacidoses) ([place]) 05/03/2015  . Yeast infection 02/09/2015  . Acute renal failure syndrome ([place])   . Tachycardia 11/03/2014  . Insomnia 09/08/2014  . Dizziness 09/08/2014  . Migraine, unspecified 12/14/2013  . Elevated troponin I level 11/19/2013  . Acute myocardial infarction, subendocardial infarction, initial episode of care ([place]) 11/18/2013  . Noncompliance with medication regimen 11/15/2013  . Leg pain 09/08/2013  . Loss of weight 09/08/2013  . Depression 09/08/2013  . Hypotension, unspecified 09/08/2013  . Tobacco use disorder 08/01/2013  . Diabetes mellitus type 1, uncontrolled ([place]) 06/24/2012  . GERD (gastroesophageal reflux disease) 06/17/2012    Past Surgical History  Procedure Laterality Date  . No past surgeries      Current Outpatient Rx  Name  Route  Sig  Dispense  Refill  . acetaminophen (TYLENOL) 325 MG tablet   Oral   Take 2 tablets (650 mg total) by mouth every 6 (six)  hours as needed for mild pain (or Fever >/= 101). Patient not taking: Reported on 09/24/2015         . albuterol (PROVENTIL HFA;VENTOLIN HFA) 108 (90 BASE) MCG/ACT inhaler   Inhalation   Inhale 2 puffs into the lungs every 6 (six) hours as needed for wheezing or shortness of breath. Patient not taking: Reported on 09/24/2015   1 Inhaler   2   . aspirin EC 81 MG EC tablet   Oral   Take 1 tablet (81 mg total) by mouth daily.   30 tablet   11   . blood glucose meter kit and supplies      Dispense based on patient and insurance preference. Use up to four times daily as directed. (FOR ICD-9 250.00, 250.01).   1 each   0   . glucose blood test strip      Use as instructed   100 each   12   . glucose monitoring kit (FREESTYLE) monitoring kit   Does not apply   1 each by Does not apply route as needed for other.   1 each   1   . insulin NPH-regular Human (NOVOLIN 70/30) (70-30) 100 UNIT/ML injection   Subcutaneous   Inject 20 Units into the skin 2 (two) times daily with a meal.   10 mL   2   . Lancets (ONETOUCH ULTRASOFT) lancets      Use as instructed   100 each   12   .  levofloxacin (LEVAQUIN) 500 MG tablet   Oral   Take 1 tablet (500 mg total) by mouth daily. Patient not taking: Reported on 09/24/2015   5 tablet   0   . traMADol (ULTRAM) 50 MG tablet   Oral   Take 1 tablet (50 mg total) by mouth every 6 (six) hours as needed. Patient not taking: Reported on 09/24/2015   12 tablet   0     Allergies Latex  Family History  Problem Relation Age of Onset  . Heart disease      No family history    Social History Social History  Substance Use Topics  . Smoking status: Current Every Day Smoker -- 0.25 packs/day for 20 years    Types: Cigarettes  . Smokeless tobacco: Never Used  . Alcohol Use: No    Review of Systems Constitutional: Negative for fever. Cardiovascular: Negative for chest pain. Respiratory: Negative for shortness of  breath. Gastrointestinal: Negative for abdominal pain and positive for nausea. Negative vomiting or diarrhea. Genitourinary: Negative for dysuria. Neurological: Negative for headache 10-point ROS otherwise negative.  ____________________________________________   PHYSICAL EXAM:  VITAL SIGNS: ED Triage Vitals  Enc Vitals Group     BP 10/02/15 0715 82/39 mmHg     Pulse Rate 10/02/15 0715 128     Resp 10/02/15 0715 18     Temp 10/02/15 0715 98.9 F (37.2 C)     Temp Source 10/02/15 0715 Oral     SpO2 10/02/15 0715 98 %     Weight 10/02/15 0715 120 lb (54.432 kg)     Height 10/02/15 0715 _0  (1.626 m)     Head Cir --      Peak Flow --      Pain Score 10/02/15 0716 8     Pain Loc --      Pain Edu? --      Excl. in [place]? --     Constitutional: Alert and oriented. Well appearing and in no distress. Eyes: Normal exam ENT   Head: Normocephalic and atraumatic.   Mouth/Throat: Extremely dry mucous membranes. Cardiovascular: Regular rhythm, rate around 120 bpm. Respiratory: Normal respiratory effort without tachypnea nor retractions. Breath sounds are clear and equal bilaterally. No wheezes/rales/rhonchi. Gastrointestinal: Soft and nontender. No distention.   Musculoskeletal: Nontender with normal range of motion in all extremities.  Neurologic:  Normal speech and language. No gross focal neurologic deficits Skin:  Skin is warm, dry and intact.  Psychiatric: Mood and affect are normal.  ____________________________________________    EKG  EKG reviewed and interpreted by myself shows sinus tachycardia 122 bpm, narrow QRS, normal axis, nonspecific ST changes.   INITIAL IMPRESSION / ASSESSMENT AND PLAN / ED COURSE  Pertinent labs & imaging results that were available during my care of the patient were reviewed by me and considered in my medical decision making (see chart for details).  Patient presents with elevated blood glucose. Has been out of insulin for 2 days. On  examination the patient looks extremely dry, with an elevated heart rate, and decreased blood pressure. We'll begin IV hydration, send labs. We will hold off on insulin administration until her potassium has resulted. Given the clinical findings, high likelihood of DKA.  Blood glucose greater than 1000. Potassium of 6.5. Creatinine of 2.2 consistent with acute renal insufficiency. We will start patient on insulin drip and admitted to the intensive care unit. I discussed splint care with the patient is agreeable.  CRITICAL CARE Performed by: Harvest Dark  Total critical care time: 30 minutes  Critical care time was exclusive of separately billable procedures and treating other patients.  Critical care was necessary to treat or prevent imminent or life-threatening deterioration.  Critical care was time spent personally by me on the following activities: development of treatment plan with patient and/or surrogate as well as nursing, discussions with consultants, evaluation of patient's response to treatment, examination of patient, obtaining history from patient or surrogate, ordering and performing treatments and interventions, ordering and review of laboratory studies, ordering and review of radiographic studies, pulse oximetry and re-evaluation of patient's condition.   ____________________________________________   FINAL CLINICAL IMPRESSION(S) / ED DIAGNOSES  Hyperglycemia Diabetic ketoacidosis  Harvest Dark, MD 10/02/15 4290  Harvest Dark, MD 10/02/15 [phone]

## 2015-10-02 NOTE — ED Notes (Signed)
Pt arrives via EMS with hyperglycemia, pt states she normally takes 25 units TID of 70/30 but has been out since yesterday, states she does not have a meter a home but just guessess her sugar, pt states syncopal episode today and nausea and vomiting

## 2015-10-02 NOTE — ED Notes (Signed)
Pt given warm blanket and ice water, pt taken to xray

## 2015-10-03 LAB — BASIC METABOLIC PANEL
Anion gap: 9 (ref 5–15)
BUN: 19 mg/dL (ref 6–20)
CALCIUM: 7.5 mg/dL — AB (ref 8.9–10.3)
CO2: 20 mmol/L — AB (ref 22–32)
CREATININE: 0.68 mg/dL (ref 0.44–1.00)
Chloride: 101 mmol/L (ref 101–111)
GFR calc non Af Amer: >60 mL/min (ref >60)
Glucose, Bld: 325 mg/dL — ABNORMAL HIGH (ref 65–99)
Potassium: 4.2 mmol/L (ref 3.5–5.1)
Sodium: 130 mmol/L — ABNORMAL LOW (ref 135–145)

## 2015-10-03 LAB — CBC
HCT: 32.5 % — ABNORMAL LOW (ref 35.0–47.0)
Hemoglobin: 11.1 g/dL — ABNORMAL LOW (ref 12.0–16.0)
MCH: 32.4 pg (ref 26.0–34.0)
MCHC: 34 g/dL (ref 32.0–36.0)
MCV: 95.2 fL (ref 80.0–100.0)
PLATELETS: 257 10*3/uL (ref 150–440)
RBC: 3.42 MIL/uL — AB (ref 3.80–5.20)
RDW: 13.9 % (ref 11.5–14.5)
WBC: 8.9 10*3/uL (ref 3.6–11.0)

## 2015-10-03 LAB — GLUCOSE, CAPILLARY
Glucose-Capillary: 323 mg/dL — ABNORMAL HIGH (ref 65–99)
Glucose-Capillary: 324 mg/dL — ABNORMAL HIGH (ref 65–99)

## 2015-10-03 MED ORDER — ACETAMINOPHEN 325 MG PO TABS
650.0000 mg | ORAL_TABLET | Freq: Four times a day (QID) | ORAL | Status: DC | PRN
  Administered 2015-10-03: 650 mg via ORAL
  Filled 2015-10-03: qty 2

## 2015-10-03 MED ORDER — TRAMADOL HCL 50 MG PO TABS
50.0000 mg | ORAL_TABLET | Freq: Four times a day (QID) | ORAL | Status: DC | PRN
  Administered 2015-10-03: 50 mg via ORAL
  Filled 2015-10-03: qty 1

## 2015-10-03 MED ORDER — LIVING WELL WITH DIABETES BOOK
Freq: Once | Status: AC
  Administered 2015-10-03: 12:00:00
  Filled 2015-10-03: qty 1

## 2015-10-03 NOTE — Progress Notes (Signed)
Patient discharged. Care Manger, Nann RN, got patient glucometer prior to discharge. Diabetes Coordinator visited for refresher education on type 1 diabetes and diabetic ketoacidosis prevention. Patient already has follow up appointment in place for next week with primary care physcian and states that she has her insulin prescription but was unable to go pick it up due to the weather. Patient states she will pick it up today. IV's removed. Discharge teaching done. RN got patient copy of Living Well With Diabetes and reviewed with patient. RN also reviewed education from Exit Care with patient on type 1 diabetes (How to Avoid Diabetes Problems, Type 1 Diabetes Mellitus Adult, Tips for Eating Away from Home If You Have Diabetes, Diabetes and Exercise, Diabetes and Sick Day Management, Diabetic Ketoacidosis, and Diabetes Mellitus and Food). RN also reinforced importance of checking capillary blood glucose and keeping a log of CBGs and improving blood sugar management to prevent health problems. Patient's questions answered. Awaiting patient's ride before patient rolls out of unit with volunteer.

## 2015-10-03 NOTE — Discharge Instructions (Signed)
Regular blood sugar check and follow ups with PMD.

## 2015-10-03 NOTE — Care Management (Signed)
Spoke with patient about previous admission at [facility] in  which she left AMA.  She says "I just don't like those people."  Patient works at [employer] and is followed by the [facility] in [place].  She denies having any issues obtaining her inuslin.  Discussed the hazards and long term effects of chronically elevated blood sugars.  she does not have a glucose meter so provided her one. She declines multiple times additional diabetes resources.

## 2015-10-03 NOTE — Progress Notes (Signed)
Anberlin Jearld AdjutantSimmons Cohn was admitted to [facility] ICU on 10/02/2015 and was not discharged until 10/03/2015.  Jamie KatoSarah E [facility] Number [phone] ICU [phone]

## 2015-10-03 NOTE — Discharge Summary (Signed)
[facility] - [place] [facility]   PATIENT NAME: Kristine Allison    MR#:  [id number]  DATE OF BIRTH:  1981/02/23  DATE OF ADMISSION:  10/02/2015 ADMITTING PHYSICIAN: Altamese Dilling, MD  DATE OF DISCHARGE: 10/03/2015  PRIMARY CARE PHYSICIAN: Janit Pagan, MD    ADMISSION DIAGNOSIS:  Cough [R05] Acute renal insufficiency [N28.9] Diabetic ketoacidosis without coma associated with type 1 diabetes mellitus (HCC) [E10.10]  DISCHARGE DIAGNOSIS:  Active Problems:   DKA (diabetic ketoacidoses) (HCC)   SECONDARY DIAGNOSIS:   Past Medical History  Diagnosis Date  . Asthma   . Diabetes mellitus without complication (HCC)   . Headache(784.0)   . Anxiety     HOSPITAL COURSE:   * DKA  Admitted to ICU- Insulin drip DKA protocol.  Monitor Frequent BMP.  IV fluids.   Improved, drip stopped, and tolerating diet, blood sugar is under control.   She is non compliant with insulin , as she ran out of medicine and due to weather could not pick her prescriptions from pharmacy.   I asked her if any financial help needed or she need help to set up appoitment with PMD or get prescriptions, she said- she does not need any help with those.   Will discharge home today.  * Hypotension  Likely dehydration due to DKA  IV fluid bolus given by ER>  continue IV fluids and monitor.   BP stable for last 10-12 hrs, after giving 3-4 ltr boluses.  * ARF  Due ot dehydration. IV fluids, Monitor.   Improved.  * Hyponatremia  Pseudo- due to hyperglycemia  Monitor after IV fluids.  * Leukocytosis  Due to DKA likely  Check UA and Xray chest  * smoking  Councelled for 4 min to quit smoking DISCHARGE CONDITIONS:   Stable.  CONSULTS OBTAINED:     DRUG ALLERGIES:   Allergies  Allergen Reactions  . Latex Rash    DISCHARGE MEDICATIONS:   Current Discharge Medication List    CONTINUE these medications which have NOT CHANGED   Details   aspirin EC 81 MG EC tablet Take 1 tablet (81 mg total) by mouth daily. Qty: 30 tablet, Refills: 11    insulin NPH-regular Human (NOVOLIN 70/30) (70-30) 100 UNIT/ML injection Inject 20 Units into the skin 2 (two) times daily with a meal. Qty: 10 mL, Refills: 2      STOP taking these medications     acetaminophen (TYLENOL) 325 MG tablet      albuterol (PROVENTIL HFA;VENTOLIN HFA) 108 (90 BASE) MCG/ACT inhaler      levofloxacin (LEVAQUIN) 500 MG tablet      traMADol (ULTRAM) 50 MG tablet          DISCHARGE INSTRUCTIONS:   Follow with PMD.  If you experience worsening of your admission symptoms, develop shortness of breath, life threatening emergency, suicidal or homicidal thoughts you must seek medical attention immediately by calling 911 or calling your MD immediately  if symptoms less severe.  You Must read complete instructions/literature along with all the possible adverse reactions/side effects for all the Medicines you take and that have been prescribed to you. Take any new Medicines after you have completely understood and accept all the possible adverse reactions/side effects.   Please note  You were cared for by a hospitalist during your hospital stay. If you have any questions about your discharge medications or the care you received while you were in the hospital after you are discharged, you can  call the unit and asked to speak with the hospitalist on call if the hospitalist that took care of you is not available. Once you are discharged, your primary care physician will handle any further medical issues. Please note that NO REFILLS for any discharge medications will be authorized once you are discharged, as it is imperative that you return to your primary care physician (or establish a relationship with a primary care physician if you do not have one) for your aftercare needs so that they can reassess your need for medications and monitor your lab values.    Today    CHIEF COMPLAINT:   Chief Complaint  Patient presents with  . Hyperglycemia    HISTORY OF PRESENT ILLNESS:  Kristine Allison  is a 35 y.o. female with a known history of DM,recent and multiple admissions in [place] for DKA- for last 2 days- did not take insulin, as she ran out of it, started having Nausea and vomiting. Noted in ER -with DKA. Denies any other complains.   VITAL SIGNS:  Blood pressure 98/67, pulse 92, temperature 98.3 F (36.8 C), temperature source Oral, resp. rate 12, height 5\' 4"  (1.626 m), weight 54.432 kg (120 lb), SpO2 99 %.  I/O:   Intake/Output Summary (Last 24 hours) at 10/03/15 1121 Last data filed at 10/03/15 1111  Gross per 24 hour  Intake 4883.79 ml  Output   3225 ml  Net 1658.79 ml    PHYSICAL EXAMINATION:  GENERAL:  35 y.o.-year-old patient lying in the bed with no acute distress.  EYES: Pupils equal, round, reactive to light and accommodation. No scleral icterus. Extraocular muscles intact.  HEENT: Head atraumatic, normocephalic. Oropharynx and nasopharynx clear.  NECK:  Supple, no jugular venous distention. No thyroid enlargement, no tenderness.  LUNGS: Normal breath sounds bilaterally, no wheezing, rales,rhonchi or crepitation. No use of accessory muscles of respiration.  CARDIOVASCULAR: S1, S2 normal. No murmurs, rubs, or gallops.  ABDOMEN: Soft, non-tender, non-distended. Bowel sounds present. No organomegaly or mass.  EXTREMITIES: No pedal edema, cyanosis, or clubbing.  NEUROLOGIC: Cranial nerves II through XII are intact. Muscle strength 5/5 in all extremities. Sensation intact. Gait not checked.  PSYCHIATRIC: The patient is alert and oriented x 3.  SKIN: No obvious rash, lesion, or ulcer.   DATA REVIEW:   CBC  Recent Labs Lab 10/03/15 0504  WBC 8.9  HGB 11.1*  HCT 32.5*  PLT 257    Chemistries   Recent Labs Lab 10/02/15 0719  10/03/15 0504  NA 125*  < > 130*  K 6.5*  < > 4.2  CL 80*  < > 101  CO2 7*  < > 20*   GLUCOSE 1005*  < > 325*  BUN 44*  < > 19  CREATININE 2.25*  < > 0.68  CALCIUM 9.2  < > 7.5*  AST 16  --   --   ALT 18  --   --   ALKPHOS 94  --   --   BILITOT 1.7*  --   --   < > = values in this interval not displayed.  Cardiac Enzymes No results for input(s): TROPONINI in the last 168 hours.  Microbiology Results  Results for orders placed or performed during the hospital encounter of 10/02/15  MRSA PCR Screening     Status: None   Collection Time: 10/02/15  2:15 PM  Result Value Ref Range Status   MRSA by PCR NEGATIVE NEGATIVE Final    Comment:  The GeneXpert MRSA Assay (FDA approved for NASAL specimens only), is one component of a comprehensive MRSA colonization surveillance program. It is not intended to diagnose MRSA infection nor to guide or monitor treatment for MRSA infections.     RADIOLOGY:  Dg Chest 2 View  10/02/2015  CLINICAL DATA:  Hyperglycemia and cough EXAM: CHEST - 2 VIEW COMPARISON:  None. FINDINGS: The heart size and mediastinal contours are within normal limits. Both lungs are clear. The visualized skeletal structures are unremarkable. IMPRESSION: No active disease. Electronically Signed   By: Alcide Clever M.D.   On: 10/02/2015 09:09     Management plans discussed with the patient, family and they are in agreement.  CODE STATUS:     Code Status Orders        Start     Ordered   10/02/15 1356  Full code   Continuous     10/02/15 1355    Code Status History    Date Active Date Inactive Code Status Order ID Comments User Context   09/25/2015  3:23 AM 09/25/2015  8:20 PM Full Code [id number]  Myra Rude, MD ED   07/25/2015  5:33 PM 07/27/2015  3:25 PM Full Code [id number]  Zannie Cove, MD Inpatient   05/03/2015  3:22 PM 05/05/2015  4:34 PM Full Code [id number]  Onnie Boer, MD ED   02/09/2015 10:56 PM 02/11/2015  4:41 PM Full Code [id number]  Raliegh Ip, DO Inpatient   12/21/2014  8:43 AM 12/23/2014  8:53 PM Full Code  [id number]  Bernadene Person, NP ED   08/20/2014  5:43 AM 08/22/2014  7:03 PM Full Code [id number]  Oralia Manis, MD Inpatient   12/02/2013  2:10 AM 12/03/2013  5:00 PM Full Code [id number]  Leona Singleton, MD Inpatient   11/15/2013  3:32 PM 11/20/2013  4:31 PM Full Code [id number]  Jeanella Craze, NP ED   07/26/2013 10:59 PM 07/29/2013  6:30 PM Full Code [id number]  Charm Rings, MD Inpatient   06/05/2013 11:38 PM 06/05/2013 11:38 PM Full Code [id number]  Glori Luis, MD Inpatient   06/05/2013 11:38 PM 06/07/2013  6:22 PM Full Code [id number]  Glori Luis, MD Inpatient      TOTAL TIME TAKING CARE OF THIS PATIENT: 35 minutes.    Altamese Dilling M.D on 10/03/2015 at 11:21 AM  Between 7am to 6pm - Pager - [phone]  After 6pm go to [email or web address] - password [email or web address] [facility]  [place] [facility]  Office  [phone]  CC: Primary care physician; Janit Pagan, MD   Note: This dictation was prepared with Dragon dictation along with smaller phrase technology. Any transcriptional errors that result from this process are unintentional.

## 2015-10-03 NOTE — Progress Notes (Addendum)
Inpatient Diabetes Program Recommendations  AACE/ADA: New Consensus Statement on Inpatient Glycemic Control (2015)  Target Ranges:  Prepandial:   less than 140 mg/dL      Peak postprandial:   less than 180 mg/dL (1-2 hours)      Critically ill patients:  140 - 180 mg/dL  Results for Kristine Allison, Kristine Allison (MRN [id number]) as of 10/03/2015 08:41  Ref. Range 10/02/2015 20:02 10/02/2015 21:15 10/02/2015 22:13 10/02/2015 23:56 10/03/2015 07:29  Glucose-Capillary Latest Ref Range: 65-99 mg/dL 952211 (H) 841240 (H) 324201 (H) 150 (H) 323 (H)   Results for Kristine Allison, Kristine Allison (MRN [id number]) as of 10/03/2015 08:41  Ref. Range 07/25/2015 14:45  Hemoglobin A1C Latest Ref Range: 4.8-5.6 % 12.6 (H)   Review of Glycemic Control  Diabetes history: DM1 Outpatient Diabetes medications: 70/30 20 units BID (prescribed but home med list has note that patient states she is taking 25 units TID) Current orders for Inpatient glycemic control: Lantus 20 units Q24H, Novolog 0-15 units TID with meals, Novolog 0-5 units HS  Inpatient Diabetes Program Recommendations: Insulin - Basal: Please consider increasing Lantus to 25 units QHS. Correction (SSI): Patient has type 1 diabetes and is sensitive to insulin. Please consider decreasing Novolog correction to sensitive scale. Insulin - Meal Coverage: Since patient has type 1 diabetes she does not make any insulin and will need insulin for carbohydrate coverage. Please consider ordering Novolog 4 units TID with meals for meal coverage. HgbA1C: Last A1C was 12.6% on 07/25/15 indicating very poor outpatient glycemic control.  10/03/15@11 :55-Patient is well known to inpatient diabetes team and has had 4 admission in the last 6 months for DKA and been seen by our team multiple times. Spoke with patient about diabetes and home regimen for diabetes control. Patient did not make eye contact with me and seemed very disinterested in talking with me. Patient would answer questions when asked but  contributed nothing more to the conversation. Patient reports that she was diagnosed with Type 1 diabetes 2 years ago and is [facility] for diabetes management. Patient states she takes Novolin 70/30 25 units TID with meals as an outpatient for diabetes control. Patient reports that she has not experienced any episodes of hypoglycemia in quite a while. Explained concern about the frequency and dose of 70/30 she was taking because 70/30 should be taken twice a day with breakfast and supper usually and if she was taking that dose TID that is a lot of insulin for a Type 1 of her size. Inquired about when she last went to the clinic for follow up and she states "it has been awhile". Inquired about next appointment and patient states [facility]. Inquired about why she ran out of insulin and patient stated "I could not go pick it up." Asked patient if she would be able to go to [facility] when she leaves to get her Novolin 70/30 and she stated "Yes. I can go get it now." Asked patient if she was able to afford to purchase her Novolin 70/30 [facility] for $25 per vial and she stated "Yes".  Asked patient if she has ever applied for medication assistance and she stated that she filled out paperwork for the [facility] about one month ago and has no heard anything back yet. Encouraged patient to call the [facility] and inquire about status of [employer] application. Spoke frankly with patient about the damage hyperglycemia  causes inside blood vessels which leads to common complications seen with uncontrolled diabetes. Discussed A1C results (12.2% on 07/25/15) and explained that her current A1C indicates an average glucose of 303 mg/dl. Stressed importance of checking CBGs and maintaining good CBG control to prevent long-term and short-term complications. Patient states that she is not checking her glucose and she needs a glucometer.  CM is working to see if they can provide her with a glucometer and testing supplies. Informed patient that if CM can not provide her with a glucometer, she needs to get a prescription for a glucometer and testing supplies which she can take to the [facility] pharmacy (if she is being followed by the clinic and has an upcoming appointment there) and they will provide her with a glucometer and testing supplies. Explained to the patient that she was young and she needed to take diabetes serious to improve her health and prevent complications we discussed. Patient verbalized understanding of information discussed and she states that she has no further questions at this time related to diabetes.   Thanks, Orlando Penner, RN, MSN, CDE Diabetes Coordinator Inpatient Diabetes Program [phone] (Team Pager from 8am to 5pm) [phone] ([facility] office) [phone] [facility] office) [phone] [facility] office)

## 2015-10-03 NOTE — Progress Notes (Signed)
eLink Physician-Brief Progress Note Patient Name: Kristine ElliotMargie E Kristine Allison Kristine Allison DOB: [birth year] MRN: [id number]   Date of Service  10/03/2015  HPI/Events of Note  C/O pain  eICU Interventions  Restart home dose of tramadol     Intervention Category Minor Interventions: Routine modifications to care plan (e.g. PRN medications for pain, fever)  Kristine Kristine Allison 10/03/2015, 5:55 AM

## 2015-10-25 ENCOUNTER — Encounter: Payer: Self-pay | Admitting: Family Medicine

## 2015-10-25 ENCOUNTER — Telehealth: Payer: Self-pay | Admitting: *Deleted

## 2015-10-25 NOTE — Telephone Encounter (Signed)
mychart message sent to patient. Jazmin Hartsell,CMA  

## 2015-10-25 NOTE — Progress Notes (Signed)
Patient ID: Kristine Allison, female   DOB: [birth year], 35 y.o.   MRN: [id number] [facility] team staff,  Please contact patient to schedule hospital follow up appointment with me.   Thank you.

## 2015-10-25 NOTE — Progress Notes (Signed)
Home number listed in chart was incorrect, so I have removed it.  I did leave a message on her mother's phone asking that patient contact us at the office.  Will also try sending a mychart message. Jazmin Hartsell,CMA

## 2017-01-20 ENCOUNTER — Encounter: Payer: Self-pay | Admitting: Medical Oncology

## 2017-01-20 ENCOUNTER — Emergency Department
Admission: EM | Admit: 2017-01-20 | Discharge: 2017-01-20 | Disposition: A | Discharge status: Home / Self Care | Payer: Self-pay | Attending: Emergency Medicine | Admitting: Emergency Medicine

## 2017-01-20 DIAGNOSIS — J45909 Unspecified asthma, uncomplicated: Secondary | ICD-10-CM | POA: Insufficient documentation

## 2017-01-20 DIAGNOSIS — Z7982 Long term (current) use of aspirin: Secondary | ICD-10-CM | POA: Insufficient documentation

## 2017-01-20 DIAGNOSIS — F17210 Nicotine dependence, cigarettes, uncomplicated: Secondary | ICD-10-CM | POA: Insufficient documentation

## 2017-01-20 DIAGNOSIS — H169 Unspecified keratitis: Secondary | ICD-10-CM | POA: Insufficient documentation

## 2017-01-20 DIAGNOSIS — Z794 Long term (current) use of insulin: Secondary | ICD-10-CM | POA: Insufficient documentation

## 2017-01-20 DIAGNOSIS — E109 Type 1 diabetes mellitus without complications: Secondary | ICD-10-CM | POA: Insufficient documentation

## 2017-01-20 MED ORDER — CIPROFLOXACIN HCL 0.3 % OP SOLN
2.0000 [drp] | Freq: Once | OPHTHALMIC | Status: AC
  Administered 2017-01-20: 2 [drp] via OPHTHALMIC
  Filled 2017-01-20: qty 2.5

## 2017-01-20 MED ORDER — TETRACAINE HCL 0.5 % OP SOLN
2.0000 [drp] | Freq: Once | OPHTHALMIC | Status: AC
  Administered 2017-01-20: 2 [drp] via OPHTHALMIC
  Filled 2017-01-20: qty 2

## 2017-01-20 MED ORDER — FLUORESCEIN SODIUM 0.6 MG OP STRP
1.0000 | ORAL_STRIP | Freq: Once | OPHTHALMIC | Status: DC
  Filled 2017-01-20: qty 1

## 2017-01-20 NOTE — ED Provider Notes (Signed)
[facility] Emergency Department Provider Note  ____________________________________________  Time seen: Approximately 9:41 AM  I have reviewed the triage vital signs and the nursing notes.   HISTORY  Chief Complaint Eye Problem    HPI Kristine Allison is a 36 y.o. female that presents to the emergency department with right eye pain, redness, photophobia for one day. Patient states that eye started to get red near her nose yesterday morning and has slowly gotten worse. She states that it feels like something is in her eye. She is having a lot of crusting and drainage from eye.She has a headache. She has never had problems with this eye before. She wears contacts and takes them out every other night. She has not worn her contacts since yesterday. Patient has diabetes. She denies fever, shortness of breath, chest pain, nausea, vomiting, abdominal pain.   Past Medical History:  Diagnosis Date  . Anxiety   . Asthma   . Diabetes mellitus without complication (HCC)   . XBJYNWGN(562.1)     Patient Active Problem List   Diagnosis Date Noted  . Dehydration   . Esophageal reflux   . Diabetic ketoacidosis without coma associated with type 1 diabetes mellitus (HCC)   . DKA (diabetic ketoacidoses) (HCC) 05/03/2015  . Yeast infection 02/09/2015  . Acute renal failure syndrome (HCC)   . Tachycardia 11/03/2014  . Insomnia 09/08/2014  . Dizziness 09/08/2014  . Migraine, unspecified 12/14/2013  . Elevated troponin I level 11/19/2013  . Acute myocardial infarction, subendocardial infarction, initial episode of care (HCC) 11/18/2013  . Noncompliance with medication regimen 11/15/2013  . Leg pain 09/08/2013  . Loss of weight 09/08/2013  . Depression 09/08/2013  . Hypotension, unspecified 09/08/2013  . Tobacco use disorder 08/01/2013  . Diabetes mellitus type 1, uncontrolled (HCC) 06/24/2012  . GERD (gastroesophageal reflux disease) 06/17/2012    Past  Surgical History:  Procedure Laterality Date  . NO PAST SURGERIES      Prior to Admission medications   Medication Sig Start Date End Date Taking? Authorizing Provider  aspirin EC 81 MG EC tablet Take 1 tablet (81 mg total) by mouth daily. 11/20/13   Nani Ravens, MD  insulin NPH-regular Human (NOVOLIN 70/30) (70-30) 100 UNIT/ML injection Inject 20 Units into the skin 2 (two) times daily with a meal. Patient taking differently: Inject 25 Units into the skin 3 (three) times daily with meals.  07/27/15   Esperanza Sheets, MD    Allergies Latex  Family History  Problem Relation Age of Onset  . Heart disease      No family history    Social History Social History  Substance Use Topics  . Smoking status: Current Every Day Smoker    Packs/day: 0.25    Years: 20.00    Types: Cigarettes  . Smokeless tobacco: Never Used  . Alcohol use No     Review of Systems  Constitutional: No fever/chills ENT: No upper respiratory complaints. Cardiovascular: No chest pain. Respiratory:  No SOB. Gastrointestinal: No abdominal pain.  No nausea, no vomiting.  Skin: Negative for rash, abrasions, lacerations, ecchymosis. Neurological: Negative for numbness or tingling   ____________________________________________   PHYSICAL EXAM:  VITAL SIGNS: ED Triage Vitals  Enc Vitals Group     BP 01/20/17 0918 122/83     Pulse Rate 01/20/17 0918 80     Resp 01/20/17 0918 18     Temp 01/20/17 0918 98.2 F (36.8 C)     Temp Source  01/20/17 0918 Oral     SpO2 01/20/17 0918 99 %     Weight 01/20/17 0916 120 lb (54.4 kg)     Height 01/20/17 0916  (1.626 m)     Head Circumference --      Peak Flow --      Pain Score 01/20/17 0916 10     Pain Loc --      Pain Edu? --      Excl. in GC? --      Constitutional: Alert and oriented. Well appearing and in no acute distress. Eyes: Conjunctivae are injected. PERRL. EOMI. Tenderness to palpation around eye. 2 mm defect over pupil visualized on  fluorescence stain. Tonometer pressures 18 and 19 Head: Atraumatic. ENT:      Ears:      Nose: No congestion/rhinnorhea.      Mouth/Throat: Mucous membranes are moist.  Neck: No stridor.  Cardiovascular: Normal rate, regular rhythm.  Good peripheral circulation. Respiratory: Normal respiratory effort without tachypnea or retractions. Lungs CTAB. Good air entry to the bases with no decreased or absent breath sounds. Musculoskeletal: Full range of motion to all extremities. No gross deformities appreciated. Neurologic:  Normal speech and language. No gross focal neurologic deficits are appreciated.  Skin:  Skin is warm, dry and intact. No rash noted.   ____________________________________________   LABS (all labs ordered are listed, but only abnormal results are displayed)  Labs Reviewed - No data to display ____________________________________________  EKG   ____________________________________________  RADIOLOGY  No results found.  ____________________________________________    PROCEDURES  Procedure(s) performed:     Visual Acuity  Right Eye Distance: 20/200 patient states she wears contacts and their at home Left Eye Distance: 20/100 Bilateral Distance: 20/200  Right Eye Near:   Left Eye Near:    Bilateral Near:      Procedures  Medications  fluorescein ophthalmic strip 1 strip (not administered)  tetracaine (PONTOCAINE) 0.5 % ophthalmic solution 2 drop (2 drops Right Eye Given 01/20/17 0951)  ciprofloxacin (CILOXAN) 0.3 % ophthalmic solution 2 drop (2 drops Right Eye Given 01/20/17 1054)     ____________________________________________   INITIAL IMPRESSION / ASSESSMENT AND PLAN / ED COURSE  Pertinent labs & imaging results that were available during my care of the patient were reviewed by me and considered in my medical decision making (see chart for details).  Review of the [place] CSRS was performed in accordance of the [facility] prior to dispensing any  controlled drugs.     Patient's diagnosis is consistent with Keratitis. Patient presented to the emergency department with right eye pain, redness, photophobia for one day. Visual acuity in right eye is 20/200 and 20/100 in left eye. Patient has epithelial defect visualized on fluorescein stain. Tonometer pressures within normal limits. Patient wears contacts. Dr. Delton Prairie was consulted and recommended that patient be seen today by ophthalmology. Patient has an appointment scheduled with Lockie Mola at 2 PM today. Patient agrees to go to the appointment. Ciprofloxacin drops were placed in right eye and patient was given the bottle to take home until appointment.  Patient is given ED precautions to return to the ED for any worsening or new symptoms.     ____________________________________________  FINAL CLINICAL IMPRESSION(S) / ED DIAGNOSES  Final diagnoses:  Keratitis      NEW MEDICATIONS STARTED DURING THIS VISIT:  Discharge Medication List as of 01/20/2017 10:44 AM          This chart was dictated using voice recognition  software/Dragon. Despite best efforts to proofread, errors can occur which can change the meaning. Any change was purely unintentional.    Enid Derry, PA-C 01/20/17 1112    Rockne Menghini, MD 01/20/17 [phone]

## 2017-01-20 NOTE — ED Notes (Signed)
See triage note.  States she woke up with pain to right eye yesterday  States she removed her contact and washed out her eye  States pain conts this am  Denies any injury

## 2017-01-20 NOTE — ED Triage Notes (Signed)
Pt reports that she has been having irritation to rt eye since yesterday.

## 2017-03-22 ENCOUNTER — Inpatient Hospital Stay
Admission: EM | Admit: 2017-03-22 | Discharge: 2017-03-24 | DRG: 637 | Disposition: A | Discharge status: Home / Self Care | Payer: Self-pay | Attending: Internal Medicine | Admitting: Internal Medicine

## 2017-03-22 ENCOUNTER — Emergency Department: Payer: Self-pay

## 2017-03-22 DIAGNOSIS — F419 Anxiety disorder, unspecified: Secondary | ICD-10-CM | POA: Diagnosis present

## 2017-03-22 DIAGNOSIS — G9341 Metabolic encephalopathy: Secondary | ICD-10-CM | POA: Diagnosis present

## 2017-03-22 DIAGNOSIS — F17210 Nicotine dependence, cigarettes, uncomplicated: Secondary | ICD-10-CM | POA: Diagnosis present

## 2017-03-22 DIAGNOSIS — R778 Other specified abnormalities of plasma proteins: Secondary | ICD-10-CM

## 2017-03-22 DIAGNOSIS — I248 Other forms of acute ischemic heart disease: Secondary | ICD-10-CM | POA: Diagnosis present

## 2017-03-22 DIAGNOSIS — I959 Hypotension, unspecified: Secondary | ICD-10-CM | POA: Diagnosis present

## 2017-03-22 DIAGNOSIS — E875 Hyperkalemia: Secondary | ICD-10-CM | POA: Diagnosis present

## 2017-03-22 DIAGNOSIS — E860 Dehydration: Secondary | ICD-10-CM | POA: Diagnosis present

## 2017-03-22 DIAGNOSIS — R000 Tachycardia, unspecified: Secondary | ICD-10-CM | POA: Diagnosis present

## 2017-03-22 DIAGNOSIS — Z91040 Latex allergy status: Secondary | ICD-10-CM

## 2017-03-22 DIAGNOSIS — Z7982 Long term (current) use of aspirin: Secondary | ICD-10-CM

## 2017-03-22 DIAGNOSIS — R7989 Other specified abnormal findings of blood chemistry: Secondary | ICD-10-CM

## 2017-03-22 DIAGNOSIS — N170 Acute kidney failure with tubular necrosis: Secondary | ICD-10-CM | POA: Diagnosis present

## 2017-03-22 DIAGNOSIS — E1010 Type 1 diabetes mellitus with ketoacidosis without coma: Principal | ICD-10-CM | POA: Diagnosis present

## 2017-03-22 DIAGNOSIS — E10649 Type 1 diabetes mellitus with hypoglycemia without coma: Secondary | ICD-10-CM | POA: Diagnosis present

## 2017-03-22 DIAGNOSIS — Z9114 Patient's other noncompliance with medication regimen: Secondary | ICD-10-CM

## 2017-03-22 DIAGNOSIS — J45909 Unspecified asthma, uncomplicated: Secondary | ICD-10-CM | POA: Diagnosis present

## 2017-03-22 DIAGNOSIS — T68XXXA Hypothermia, initial encounter: Secondary | ICD-10-CM | POA: Diagnosis present

## 2017-03-22 LAB — URINALYSIS, COMPLETE (UACMP) WITH MICROSCOPIC
Bilirubin Urine: NEGATIVE
Ketones, ur: 80 mg/dL — AB
LEUKOCYTES UA: NEGATIVE
NITRITE: NEGATIVE
PH: 5 (ref 5.0–8.0)
PROTEIN: 30 mg/dL — AB
Specific Gravity, Urine: 1.016 (ref 1.005–1.030)

## 2017-03-22 LAB — BASIC METABOLIC PANEL
ANION GAP: 19 — AB (ref 5–15)
BUN: 42 mg/dL — AB (ref 6–20)
CO2: 11 mmol/L — AB (ref 22–32)
CREATININE: 1.62 mg/dL — AB (ref 0.44–1.00)
Calcium: 7.1 mg/dL — ABNORMAL LOW (ref 8.9–10.3)
Chloride: 111 mmol/L (ref 101–111)
GFR calc Af Amer: 47 mL/min — ABNORMAL LOW (ref >60)
GFR, EST NON AFRICAN AMERICAN: 41 mL/min — AB (ref >60)
GLUCOSE: 397 mg/dL — AB (ref 65–99)
Potassium: 3.8 mmol/L (ref 3.5–5.1)
Sodium: 141 mmol/L (ref 135–145)

## 2017-03-22 LAB — COMPREHENSIVE METABOLIC PANEL
ALBUMIN: 3.5 g/dL (ref 3.5–5.0)
ALK PHOS: 118 U/L (ref 38–126)
ALT: 15 U/L (ref 14–54)
AST: 19 U/L (ref 15–41)
BILIRUBIN TOTAL: 2.2 mg/dL — AB (ref 0.3–1.2)
BUN: 53 mg/dL — AB (ref 6–20)
CALCIUM: 7.7 mg/dL — AB (ref 8.9–10.3)
CO2: <7 mmol/L — ABNORMAL LOW (ref 22–32)
Chloride: 87 mmol/L — ABNORMAL LOW (ref 101–111)
Creatinine, Ser: 2.91 mg/dL — ABNORMAL HIGH (ref 0.44–1.00)
GFR calc Af Amer: 23 mL/min — ABNORMAL LOW (ref >60)
GFR calc non Af Amer: 20 mL/min — ABNORMAL LOW (ref >60)
Glucose, Bld: 1162 mg/dL (ref 65–99)
Potassium: 6.1 mmol/L — ABNORMAL HIGH (ref 3.5–5.1)
Sodium: 130 mmol/L — ABNORMAL LOW (ref 135–145)
TOTAL PROTEIN: 6.1 g/dL — AB (ref 6.5–8.1)

## 2017-03-22 LAB — GLUCOSE, CAPILLARY
GLUCOSE-CAPILLARY: 539 mg/dL — AB (ref 65–99)
GLUCOSE-CAPILLARY: 421 mg/dL — AB (ref 65–99)
Glucose-Capillary: >600 mg/dL (ref 65–99)
Glucose-Capillary: >600 mg/dL (ref 65–99)
Glucose-Capillary: >600 mg/dL (ref 65–99)
Glucose-Capillary: 446 mg/dL — ABNORMAL HIGH (ref 65–99)

## 2017-03-22 LAB — CBC WITH DIFFERENTIAL/PLATELET
BASOS PCT: 0 %
Basophils Absolute: 0.1 10*3/uL (ref 0–0.1)
EOS ABS: 0.1 10*3/uL (ref 0–0.7)
Eosinophils Relative: 0 %
HEMATOCRIT: 45.5 % (ref 35.0–47.0)
Hemoglobin: 13.4 g/dL (ref 12.0–16.0)
LYMPHS ABS: 4.1 10*3/uL — AB (ref 1.0–3.6)
Lymphocytes Relative: 16 %
MCH: 31.5 pg (ref 26.0–34.0)
MCHC: 29.5 g/dL — ABNORMAL LOW (ref 32.0–36.0)
MCV: 106.8 fL — ABNORMAL HIGH (ref 80.0–100.0)
MONO ABS: 2 10*3/uL — AB (ref 0.2–0.9)
MONOS PCT: 8 %
Neutro Abs: 18.9 10*3/uL — ABNORMAL HIGH (ref 1.4–6.5)
Neutrophils Relative %: 76 %
Platelets: 311 10*3/uL (ref 150–440)
RBC: 4.26 MIL/uL (ref 3.80–5.20)
RDW: 14.2 % (ref 11.5–14.5)
WBC: 25.1 10*3/uL — ABNORMAL HIGH (ref 3.6–11.0)

## 2017-03-22 LAB — BLOOD GAS, VENOUS
FIO2: 0.21
Patient temperature: 37
pCO2, Ven: 19 mmHg — CL (ref 44.0–60.0)
pH, Ven: <6.9 — CL (ref 7.250–7.430)
pO2, Ven: 71 mmHg — ABNORMAL HIGH (ref 32.0–45.0)

## 2017-03-22 LAB — TSH: TSH: 0.528 u[IU]/mL (ref 0.350–4.500)

## 2017-03-22 LAB — MRSA PCR SCREENING: MRSA by PCR: NEGATIVE

## 2017-03-22 LAB — TROPONIN I: Troponin I: 0.04 ng/mL (ref <0.03)

## 2017-03-22 LAB — LACTIC ACID, PLASMA
Lactic Acid, Venous: 4.3 mmol/L (ref 0.5–1.9)
Lactic Acid, Venous: 1 mmol/L (ref 0.5–1.9)

## 2017-03-22 MED ORDER — SODIUM BICARBONATE 8.4 % IV SOLN
INTRAVENOUS | Status: AC
  Filled 2017-03-22: qty 50

## 2017-03-22 MED ORDER — SODIUM CHLORIDE 0.9 % IV BOLUS (SEPSIS)
1000.0000 mL | Freq: Once | INTRAVENOUS | Status: AC
  Administered 2017-03-22: 1000 mL via INTRAVENOUS

## 2017-03-22 MED ORDER — HEPARIN SODIUM (PORCINE) 5000 UNIT/ML IJ SOLN
5000.0000 [IU] | Freq: Three times a day (TID) | INTRAMUSCULAR | Status: DC
  Administered 2017-03-22 – 2017-03-24 (×5): 5000 [IU] via SUBCUTANEOUS
  Filled 2017-03-22 (×5): qty 1

## 2017-03-22 MED ORDER — DEXTROSE 5 % IV SOLN
1.0000 g | INTRAVENOUS | Status: DC
  Administered 2017-03-23: 1 g via INTRAVENOUS
  Filled 2017-03-22 (×2): qty 10

## 2017-03-22 MED ORDER — SODIUM CHLORIDE 0.9 % IV BOLUS (SEPSIS)
500.0000 mL | Freq: Once | INTRAVENOUS | Status: AC
  Administered 2017-03-22: 500 mL via INTRAVENOUS

## 2017-03-22 MED ORDER — ACETAMINOPHEN 650 MG RE SUPP: 650.0000 mg | Freq: Four times a day (QID) | RECTAL | Status: DC | PRN

## 2017-03-22 MED ORDER — ACETAMINOPHEN 325 MG PO TABS
650.0000 mg | ORAL_TABLET | Freq: Four times a day (QID) | ORAL | Status: DC | PRN
  Administered 2017-03-23: 650 mg via ORAL
  Filled 2017-03-22: qty 2

## 2017-03-22 MED ORDER — STERILE WATER FOR INJECTION IV SOLN
INTRAVENOUS | Status: DC
  Administered 2017-03-22 – 2017-03-23 (×2): via INTRAVENOUS
  Filled 2017-03-22 (×6): qty 9.71

## 2017-03-22 MED ORDER — INSULIN ASPART 100 UNIT/ML ~~LOC~~ SOLN
10.0000 [IU] | Freq: Once | SUBCUTANEOUS | Status: AC
  Administered 2017-03-22: 10 [IU] via INTRAVENOUS
  Filled 2017-03-22: qty 1

## 2017-03-22 MED ORDER — SODIUM CHLORIDE 0.9 % IV SOLN
INTRAVENOUS | Status: DC
  Administered 2017-03-22: 5.4 [IU]/h via INTRAVENOUS
  Administered 2017-03-23: 10.9 [IU]/h via INTRAVENOUS
  Filled 2017-03-22 (×2): qty 1

## 2017-03-22 MED ORDER — SODIUM CHLORIDE 0.9 % IV SOLN
INTRAVENOUS | Status: DC
  Administered 2017-03-22: 22:00:00 via INTRAVENOUS

## 2017-03-22 MED ORDER — ONDANSETRON HCL 4 MG/2ML IJ SOLN
4.0000 mg | Freq: Four times a day (QID) | INTRAMUSCULAR | Status: DC | PRN
  Administered 2017-03-23: 19:00:00 4 mg via INTRAVENOUS
  Filled 2017-03-22: qty 2

## 2017-03-22 MED ORDER — ONDANSETRON HCL 4 MG PO TABS: 4.0000 mg | ORAL_TABLET | Freq: Four times a day (QID) | ORAL | Status: DC | PRN

## 2017-03-22 MED ORDER — SODIUM BICARBONATE 8.4 % IV SOLN
50.0000 meq | Freq: Once | INTRAVENOUS | Status: AC
  Administered 2017-03-22: 50 meq via INTRAVENOUS

## 2017-03-22 MED ORDER — SODIUM CHLORIDE 0.9 % IV BOLUS (SEPSIS)
250.0000 mL | Freq: Once | INTRAVENOUS | Status: AC
  Administered 2017-03-22: 250 mL via INTRAVENOUS

## 2017-03-22 MED ORDER — PIPERACILLIN-TAZOBACTAM 3.375 G IVPB 30 MIN
3.3750 g | Freq: Once | INTRAVENOUS | Status: AC
  Administered 2017-03-22: 3.375 g via INTRAVENOUS
  Filled 2017-03-22: qty 50

## 2017-03-22 MED ORDER — VANCOMYCIN HCL IN DEXTROSE 1-5 GM/200ML-% IV SOLN
1000.0000 mg | Freq: Once | INTRAVENOUS | Status: AC
  Administered 2017-03-22: 1000 mg via INTRAVENOUS
  Filled 2017-03-22: qty 200

## 2017-03-22 MED ORDER — ONDANSETRON HCL 4 MG/2ML IJ SOLN
4.0000 mg | Freq: Once | INTRAMUSCULAR | Status: AC
Start: 2017-03-22 — End: 2017-03-22
  Administered 2017-03-22: 4 mg via INTRAVENOUS
  Filled 2017-03-22: qty 2

## 2017-03-22 MED ORDER — SODIUM BICARBONATE 8.4 % IV SOLN
50.0000 meq | Freq: Once | INTRAVENOUS | Status: AC
  Administered 2017-03-22: 50 meq via INTRAVENOUS
  Filled 2017-03-22: qty 50

## 2017-03-22 MED ORDER — LORAZEPAM 2 MG/ML IJ SOLN
0.5000 mg | Freq: Once | INTRAMUSCULAR | Status: AC
  Administered 2017-03-22: 0.5 mg via INTRAVENOUS
  Filled 2017-03-22: qty 1

## 2017-03-22 NOTE — Progress Notes (Signed)
ANTIBIOTIC CONSULT NOTE - INITIAL  Pharmacy Consult for Ceftriaxone  Indication: UTI  Allergies  Allergen Reactions  . Latex Rash    Patient Measurements: Height: 5\' 4"  (162.6 cm) Weight: 109 lb 3.2 oz (49.5 kg) IBW/kg (Calculated) : 54.7 Adjusted Body Weight:   Vital Signs: Temp: 94.2 F (34.6 C) (07/01 1915) Temp Source: Rectal (07/01 1822) BP: 116/65 (07/01 1915) Pulse Rate: 122 (07/01 1915) Intake/Output from previous day: No intake/output data recorded. Intake/Output from this shift: Total I/O In: -  Out: 700 [Urine:700]  Labs:  Recent Labs  03/22/17 1720  WBC 25.1*  HGB 13.4  PLT 311  CREATININE 2.91*   Estimated Creatinine Clearance: 21.3 mL/min (A) (by C-G formula based on SCr of 2.91 mg/dL (H)). No results for input(s): VANCOTROUGH, VANCOPEAK, VANCORANDOM, GENTTROUGH, GENTPEAK, GENTRANDOM, TOBRATROUGH, TOBRAPEAK, TOBRARND, AMIKACINPEAK, AMIKACINTROU, AMIKACIN in the last 72 hours.   Microbiology: No results found for this or any previous visit (from the past 720 hour(s)).  Medical History: Past Medical History:  Diagnosis Date  . Anxiety   . Asthma   . Diabetes mellitus without complication (HCC)   . Headache(784.0)     Medications:  Scheduled:   Assessment: CrCl = 21.3 ml/min   Goal of Therapy:  resolution of infection  Plan:  Expected duration 7 days with resolution of temperature and/or normalization of WBC   Ceftriaxone 1 gm IV Q24H ordered to start on 7/1 @ 21:00.   Liberty Stead D 03/22/2017,7:57 PM

## 2017-03-22 NOTE — ED Provider Notes (Addendum)
[facility] Emergency Department Provider Note       Time seen: ----------------------------------------- 5:11 PM on 03/22/2017 -----------------------------------------   Level V caveat: History/ROS limited by altered mental status  I have reviewed the triage vital signs and the nursing notes.   HISTORY   Chief Complaint No chief complaint on file.    HPI Kristine Allison is a 36 y.o. female who presents to the ED for hyperglycemia and altered mental status. Reportedly she had recently had some vomiting and her blood sugars had previously been under control. No further information is available.    Past Medical History:  Diagnosis Date  . Anxiety   . Asthma   . Diabetes mellitus without complication (HCC)   . ZOXWRUEA(540.9)     Patient Active Problem List   Diagnosis Date Noted  . Dehydration   . Esophageal reflux   . Diabetic ketoacidosis without coma associated with type 1 diabetes mellitus (HCC)   . DKA (diabetic ketoacidoses) (HCC) 05/03/2015  . Yeast infection 02/09/2015  . Acute renal failure syndrome (HCC)   . Tachycardia 11/03/2014  . Insomnia 09/08/2014  . Dizziness 09/08/2014  . Migraine, unspecified 12/14/2013  . Elevated troponin I level 11/19/2013  . Acute myocardial infarction, subendocardial infarction, initial episode of care (HCC) 11/18/2013  . Noncompliance with medication regimen 11/15/2013  . Leg pain 09/08/2013  . Loss of weight 09/08/2013  . Depression 09/08/2013  . Hypotension, unspecified 09/08/2013  . Tobacco use disorder 08/01/2013  . Diabetes mellitus type 1, uncontrolled (HCC) 06/24/2012  . GERD (gastroesophageal reflux disease) 06/17/2012    Past Surgical History:  Procedure Laterality Date  . NO PAST SURGERIES      Allergies Latex  Social History Social History  Substance Use Topics  . Smoking status: Current Every Day Smoker    Packs/day: 0.25    Years: 20.00    Types: Cigarettes  .  Smokeless tobacco: Never Used  . Alcohol use No    Review of Systems Unknown at this time, patient altered with hyperglycemia  All systems negative/normal/unremarkable except as stated in the HPI  ____________________________________________   PHYSICAL EXAM:  VITAL SIGNS: ED Triage Vitals  Enc Vitals Group     BP      Pulse      Resp      Temp      Temp src      SpO2      Weight      Height      Head Circumference      Peak Flow      Pain Score      Pain Loc      Pain Edu?      Excl. in GC?     Constitutional: Lethargic and disoriented, moderate distress. Eyes: Conjunctivae are normal.  ENT   Head: Normocephalic and atraumatic.   Nose: No congestion/rhinnorhea.   Mouth/Throat: Mucous membranes are exceedingly dry and the perioral area with dry and fissured lips   Neck: No stridor. Cardiovascular: Normal rate, regular rhythm. No murmurs, rubs, or gallops. Respiratory: Tachypnea with clear breath sounds. Small respirations are noted Gastrointestinal: Soft and nontender. Normal bowel sounds Musculoskeletal: Nontender with normal range of motion in extremities. No lower extremity tenderness nor edema. Neurologic: Slurred speech, patient with altered mental status. Patient intermittently follows commands. Skin:  There is dry skin is noted Psychiatric: Patient is somewhat agitated ____________________________________________  EKG: Interpreted by me.Sinus tachycardia with rate of 1 13 bpm, normal QRS,  normal QT.  ____________________________________________  ED COURSE:  Pertinent labs & imaging results that were available during my care of the patient were reviewed by me and considered in my medical decision making (see chart for details). Patient presents for Hyperglycemia and altered mental status, we will assess with labs and imaging as indicated. Patient appears to be in DKA, we have started aggressive fluid resuscitation with pressure bags of normal  saline, insulin drip and she will likely need sodium bicarbonate. Patient is critically ill and will require ICU admission.   Procedures ____________________________________________   LABS (pertinent positives/negatives)  Labs Reviewed  LACTIC ACID, PLASMA - Abnormal; Notable for the following:       Result Value   Lactic Acid, Venous 4.3 (*)    All other components within normal limits  CBC WITH DIFFERENTIAL/PLATELET - Abnormal; Notable for the following:    WBC 25.1 (*)    MCV 106.8 (*)    MCHC 29.5 (*)    Neutro Abs 18.9 (*)    Lymphs Abs 4.1 (*)    Monocytes Absolute 2.0 (*)    All other components within normal limits  BLOOD GAS, VENOUS - Abnormal; Notable for the following:    pH, Ven <6.900 (*)    pCO2, Ven 19 (*)    pO2, Ven 71.0 (*)    All other components within normal limits  GLUCOSE, CAPILLARY - Abnormal; Notable for the following:    Glucose-Capillary >600 (*)    All other components within normal limits  GLUCOSE, CAPILLARY - Abnormal; Notable for the following:    Glucose-Capillary >600 (*)    All other components within normal limits  CULTURE, BLOOD (ROUTINE X 2)  CULTURE, BLOOD (ROUTINE X 2)  URINE CULTURE  LACTIC ACID, PLASMA  COMPREHENSIVE METABOLIC PANEL  TROPONIN I  URINALYSIS, COMPLETE (UACMP) WITH MICROSCOPIC   CRITICAL CARE Performed by: Emily Filbert   Total critical care time: 30 minutes  Critical care time was exclusive of separately billable procedures and treating other patients.  Critical care was necessary to treat or prevent imminent or life-threatening deterioration.  Critical care was time spent personally by me on the following activities: development of treatment plan with patient and/or surrogate as well as nursing, discussions with consultants, evaluation of patient's response to treatment, examination of patient, obtaining history from patient or surrogate, ordering and performing treatments and interventions, ordering and  review of laboratory studies, ordering and review of radiographic studies, pulse oximetry and re-evaluation of patient's condition.  RADIOLOGY Images were viewed by me  Chest x-ray is unremarkable  ____________________________________________  FINAL ASSESSMENT AND PLAN  Diabetic ketoacidosis, Hypothermia, acute renal failure, hyperkalemia, lactic acidosis  Plan: Patient's labs and imaging were dictated above. Patient had presented for altered mental status with hyperglycemia and likely severe DKA. Possible sepsis as well. We have started her on saline infusions via pressure bag and initiated sepsis protocol with cultures and broad-spectrum antibiotics. She is currently on the bear hugger as well due to hypothermia. Reviewing her records this is the 14th time she has been admitted to the hospital in diabetic ketoacidosis. She remains in critical condition at this time.   Emily Filbert, MD   Note: This note was generated in part or whole with voice recognition software. Voice recognition is usually quite accurate but there are transcription errors that can and very often do occur. I apologize for any typographical errors that were not detected and corrected.     Emily Filbert, MD 03/22/17  [id number]    Emily FilbertWilliams, Hogan Hoobler E, MD 03/22/17 Ayesha [id number]    Emily FilbertWilliams, Ojani Berenson E, MD 03/22/17 [phone]

## 2017-03-22 NOTE — Progress Notes (Signed)
Sent message to pharm to send bicarb and rocephin ABX. Pt continue on insulin gtt per DKA protocol. More responsive. Asked for water but had to use mouth swab since pt is NPO. Lips dried and cracked on arrival.

## 2017-03-22 NOTE — ED Triage Notes (Signed)
Pt came to ED via EMS with hyperglycemia, blood sugar read high on the meter (>600). Pt follows some commands, moaning in pain. Dry cracked lips. HR 114. Bp 94/54. Per family, pt has been sick for past 3 days. Did not take any insulin today.

## 2017-03-22 NOTE — ED Notes (Signed)
Critical vbg results reported to MD Mayford KnifeWilliams

## 2017-03-22 NOTE — H&P (Addendum)
[facility] [place] at [facility]   PATIENT NAME: Kristine Allison    MR#:  [id number]  DATE OF BIRTH:  03-12-1981  DATE OF ADMISSION:  03/22/2017  PRIMARY CARE PHYSICIAN: Doreene Eland, MD   REQUESTING/REFERRING PHYSICIAN: Dr. Daryel November  CHIEF COMPLAINT:   Chief Complaint  Patient presents with  . Hyperglycemia    HISTORY OF PRESENT ILLNESS:  Kristine Allison  is a 36 y.o. female with a known history of Type 1 diabetes mellitus with prior admissions for DKA, asthma comes to the hospital secondary to elevated sugars and lethargy at home. Patient is still lethargic, arousable and following some simple commands. Most of the history is obtained from calling her mother and also the ER physician. Patient had history of noncompliance with her insulin and prior ER visits for elevated sugars in the past. Talking to her mother, patient has been taking her insulin like she is supposed to. She says her illness started about 2 days ago when she was very weak, unable to keep any fluids down. She's been so dehydrated today that she was barely arousable and so EMS was called. Her blood sugars are in the 1100's here, with significant metabolic acidosis, hyperkalemia and acute renal failure. She is appearing critically ill at this time. Received 3 fluid boluses already and started on insulin drip and bicarbonate push given. Patient is being admitted to ICU.  PAST MEDICAL HISTORY:   Past Medical History:  Diagnosis Date  . Anxiety   . Asthma   . Diabetes mellitus without complication (HCC)   . Headache(784.0)     PAST SURGICAL HISTORY:   Past Surgical History:  Procedure Laterality Date  . NO PAST SURGERIES      SOCIAL HISTORY:   Social History  Substance Use Topics  . Smoking status: Current Every Day Smoker    Packs/day: 0.25    Years: 20.00    Types: Cigarettes  . Smokeless tobacco: Never Used  . Alcohol use No    FAMILY HISTORY:   Family  History  Problem Relation Age of Onset  . Heart disease Unknown        No family history    DRUG ALLERGIES:   Allergies  Allergen Reactions  . Latex Rash    REVIEW OF SYSTEMS:   Review of Systems  Unable to perform ROS: Critical illness    MEDICATIONS AT HOME:   Prior to Admission medications   Medication Sig Start Date End Date Taking? Authorizing Provider  aspirin EC 81 MG EC tablet Take 1 tablet (81 mg total) by mouth daily. 11/20/13   Nani Ravens, MD  insulin NPH-regular Human (NOVOLIN 70/30) (70-30) 100 UNIT/ML injection Inject 20 Units into the skin 2 (two) times daily with a meal. Patient taking differently: Inject 25 Units into the skin 3 (three) times daily with meals.  07/27/15   Esperanza Sheets, MD      VITAL SIGNS:  Blood pressure 116/65, pulse (!) 122, temperature (!) 94.2 F (34.6 C), resp. rate 20, height 5\' 4"  (1.626 m), weight 49.5 kg (109 lb 3.2 oz), SpO2 100 %.  PHYSICAL EXAMINATION:   Physical Exam  GENERAL:  36 y.o.-year-old patient lying in the bed, lethargic and critically ill appearing.  EYES: Pupils equal, round, reactive to light and accommodation. No scleral icterus. Extraocular muscles intact.  HEENT: Head atraumatic, normocephalic. nasopharynx clear. , Oropharynx with extremely dry mucous membranes and tongue. Tongue piercing is present. Lips have  dry flakes NECK:  Supple, no jugular venous distention. No thyroid enlargement, no tenderness.  LUNGS: Normal breath sounds bilaterally, tachypneic. no wheezing, rales,rhonchi or crepitation. No use of accessory muscles of respiration. Decreased bibasilar breath sounds CARDIOVASCULAR: S1, S2 normal. Rapid and regular heart rate. No murmurs, rubs, or gallops.  ABDOMEN: Soft, nontender, nondistended. Bowel sounds present. No organomegaly or mass.  EXTREMITIES: No pedal edema, cyanosis, or clubbing.  NEUROLOGIC: Lethargic, arousable at this time. Following some simple commands. Unable to answer  questions appropriately.  PSYCHIATRIC: The patient is lethargic and arousable at this time.  SKIN: No obvious rash, lesion, or ulcer.   LABORATORY PANEL:   CBC  Recent Labs Lab 03/22/17 1720  WBC 25.1*  HGB 13.4  HCT 45.5  PLT 311   ------------------------------------------------------------------------------------------------------------------  Chemistries   Recent Labs Lab 03/22/17 1720  NA 130*  K 6.1*  CL 87*  CO2 <7*  GLUCOSE 1,162*  BUN 53*  CREATININE 2.91*  CALCIUM 7.7*  AST 19  ALT 15  ALKPHOS 118  BILITOT 2.2*   ------------------------------------------------------------------------------------------------------------------  Cardiac Enzymes  Recent Labs Lab 03/22/17 1720  TROPONINI 0.04*   ------------------------------------------------------------------------------------------------------------------  RADIOLOGY:  Dg Chest Port 1 View  Result Date: 03/22/2017 CLINICAL DATA:  36 year old female with fever and decreased responsiveness. EXAM: PORTABLE CHEST 1 VIEW COMPARISON:  10/02/2015. FINDINGS: Portable AP upright view at 1729 hours. Lung volumes are stable and within normal limits. Mediastinal contours remain normal. Allowing for portable technique the lungs are clear. No acute osseous abnormality identified. IMPRESSION: Negative.  No acute cardiopulmonary abnormality. Electronically Signed   By: Odessa FlemingH  Hall M.D.   On: 03/22/2017 17:50    EKG:   Orders placed or performed during the hospital encounter of 03/22/17  . ED EKG 12-Lead  . ED EKG 12-Lead  . EKG 12-Lead  . EKG 12-Lead    IMPRESSION AND PLAN:   Kristine Allison  is a 36 y.o. female with a known history of Type 1 diabetes mellitus with prior admissions for DKA, asthma comes to the hospital secondary to elevated sugars and lethargy at home.  #1 DKA- started on insulin drip. Admit to ICU. Critically ill appearing at this time. -Repeat BMP every 4-6 hours until anion gap  closes. -Follow up A1c  #2 severe metabolic acidosis-secondary to ketoacidosis. Received bicarbonate push and starting on bicarbonate drip -Recheck labs in 1-2 hours and adjust the fluids.  #3 hyperkalemia-elevated potassium from underlying acidosis. Received sodium bicarbonate push. Sugars are being corrected with insulin and IV fluids. -Repeat labs are pending  #4 acute renal failure- ATN. IV fluids, correct acidosis and monitor. -Avoid nephrotoxins  #5 leukocytosis- patient is hypothermic, has leukocytosis. Likely from her acidosis and stress margination. -Chest x-ray with no evidence of infection. Urine analysis has only rare bacteria. For now continue Rocephin. Follow up cultures and discontinue antibiotics if negative. -Follow up labs again in a.m.  #6 elevated troponin- likely demand ischemia. Recycle troponins and monitor on telemetry  #7  metabolic encephalopathy-secondary to acidosis and acute renal failure and metabolic derangements. -No focal deficits at this time. When arousable, following simple commands and able to move all extremities. -With correction of her metabolic issues, but still mental status is not normal then we will get a CT head  #8 DVT prophylaxis-on subcutaneous heparin until renal function improves     All the records are reviewed and case discussed with ED provider. Management plans discussed with the patient, family and they are in agreement.  CODE  STATUS: Full Code  TOTAL CRITICAL CARE TIME SPENT IN TAKING CARE OF THIS PATIENT: 55 minutes.    Ethon Wymer M.D on 03/22/2017 at 7:32 PM  Between 7am to 6pm - Pager - [phone]  After 6pm go to [email or web address] - Social research officer, government  [facility]  Office  [phone]  CC: Primary care physician; Doreene Eland, MD

## 2017-03-22 NOTE — ED Notes (Signed)
Bair hugger applied.

## 2017-03-22 NOTE — ED Notes (Signed)
No zosyn or vanc in pyxis, called pharmacy.

## 2017-03-22 NOTE — ED Notes (Signed)
Pt more arrousable, answering some more questions.

## 2017-03-23 LAB — GLUCOSE, CAPILLARY
GLUCOSE-CAPILLARY: 451 mg/dL — AB (ref 65–99)
GLUCOSE-CAPILLARY: 306 mg/dL — AB (ref 65–99)
GLUCOSE-CAPILLARY: 184 mg/dL — AB (ref 65–99)
GLUCOSE-CAPILLARY: 155 mg/dL — AB (ref 65–99)
GLUCOSE-CAPILLARY: 105 mg/dL — AB (ref 65–99)
GLUCOSE-CAPILLARY: 113 mg/dL — AB (ref 65–99)
GLUCOSE-CAPILLARY: 129 mg/dL — AB (ref 65–99)
GLUCOSE-CAPILLARY: 52 mg/dL — AB (ref 65–99)
GLUCOSE-CAPILLARY: 97 mg/dL (ref 65–99)
Glucose-Capillary: 111 mg/dL — ABNORMAL HIGH (ref 65–99)
Glucose-Capillary: 117 mg/dL — ABNORMAL HIGH (ref 65–99)
Glucose-Capillary: 125 mg/dL — ABNORMAL HIGH (ref 65–99)
Glucose-Capillary: 138 mg/dL — ABNORMAL HIGH (ref 65–99)
Glucose-Capillary: 146 mg/dL — ABNORMAL HIGH (ref 65–99)
Glucose-Capillary: 153 mg/dL — ABNORMAL HIGH (ref 65–99)
Glucose-Capillary: 241 mg/dL — ABNORMAL HIGH (ref 65–99)
Glucose-Capillary: 255 mg/dL — ABNORMAL HIGH (ref 65–99)
Glucose-Capillary: 81 mg/dL (ref 65–99)

## 2017-03-23 LAB — BASIC METABOLIC PANEL
ANION GAP: 13 (ref 5–15)
Anion gap: 7 (ref 5–15)
BUN: 36 mg/dL — ABNORMAL HIGH (ref 6–20)
BUN: 34 mg/dL — AB (ref 6–20)
CALCIUM: 7.6 mg/dL — AB (ref 8.9–10.3)
CO2: 17 mmol/L — AB (ref 22–32)
CO2: 22 mmol/L (ref 22–32)
CREATININE: 1.39 mg/dL — AB (ref 0.44–1.00)
CREATININE: 1.1 mg/dL — AB (ref 0.44–1.00)
Calcium: 7.4 mg/dL — ABNORMAL LOW (ref 8.9–10.3)
Chloride: 113 mmol/L — ABNORMAL HIGH (ref 101–111)
Chloride: 115 mmol/L — ABNORMAL HIGH (ref 101–111)
GFR calc Af Amer: 57 mL/min — ABNORMAL LOW (ref >60)
GFR calc Af Amer: >60 mL/min (ref >60)
GFR, EST NON AFRICAN AMERICAN: 49 mL/min — AB (ref >60)
GLUCOSE: 227 mg/dL — AB (ref 65–99)
GLUCOSE: 168 mg/dL — AB (ref 65–99)
Potassium: 3.8 mmol/L (ref 3.5–5.1)
Potassium: 3.6 mmol/L (ref 3.5–5.1)
Sodium: 143 mmol/L (ref 135–145)
Sodium: 144 mmol/L (ref 135–145)

## 2017-03-23 LAB — CBC
HCT: 34.2 % — ABNORMAL LOW (ref 35.0–47.0)
Hemoglobin: 11.9 g/dL — ABNORMAL LOW (ref 12.0–16.0)
MCH: 31.3 pg (ref 26.0–34.0)
MCHC: 34.7 g/dL (ref 32.0–36.0)
MCV: 90.3 fL (ref 80.0–100.0)
Platelets: 209 10*3/uL (ref 150–440)
RBC: 3.79 MIL/uL — ABNORMAL LOW (ref 3.80–5.20)
RDW: 13.1 % (ref 11.5–14.5)
WBC: 12.2 10*3/uL — ABNORMAL HIGH (ref 3.6–11.0)

## 2017-03-23 LAB — PROCALCITONIN: PROCALCITONIN: 2.54 ng/mL

## 2017-03-23 LAB — PHOSPHORUS

## 2017-03-23 LAB — MAGNESIUM: Magnesium: 1.6 mg/dL — ABNORMAL LOW (ref 1.7–2.4)

## 2017-03-23 MED ORDER — METOCLOPRAMIDE HCL 5 MG/ML IJ SOLN
5.0000 mg | Freq: Once | INTRAMUSCULAR | Status: AC
  Administered 2017-03-23: 5 mg via INTRAVENOUS
  Filled 2017-03-23: qty 2

## 2017-03-23 MED ORDER — INSULIN ASPART 100 UNIT/ML ~~LOC~~ SOLN
3.0000 [IU] | Freq: Three times a day (TID) | SUBCUTANEOUS | Status: DC
  Administered 2017-03-24 (×2): 3 [IU] via SUBCUTANEOUS
  Filled 2017-03-23 (×2): qty 1

## 2017-03-23 MED ORDER — ZOLPIDEM TARTRATE 5 MG PO TABS: 5.0000 mg | ORAL_TABLET | Freq: Every evening | ORAL | Status: DC | PRN

## 2017-03-23 MED ORDER — INSULIN DETEMIR 100 UNIT/ML ~~LOC~~ SOLN: 10.0000 [IU] | Freq: Every day | SUBCUTANEOUS | Status: DC

## 2017-03-23 MED ORDER — TRAMADOL HCL 50 MG PO TABS
50.0000 mg | ORAL_TABLET | Freq: Four times a day (QID) | ORAL | Status: DC | PRN
  Administered 2017-03-23 – 2017-03-24 (×2): 50 mg via ORAL
  Filled 2017-03-23 (×3): qty 1

## 2017-03-23 MED ORDER — MAGNESIUM SULFATE IN D5W 1-5 GM/100ML-% IV SOLN
1.0000 g | Freq: Once | INTRAVENOUS | Status: AC
  Administered 2017-03-23: 1 g via INTRAVENOUS
  Filled 2017-03-23: qty 100

## 2017-03-23 MED ORDER — INSULIN ASPART 100 UNIT/ML ~~LOC~~ SOLN
0.0000 [IU] | Freq: Three times a day (TID) | SUBCUTANEOUS | Status: DC
  Administered 2017-03-24: 5 [IU] via SUBCUTANEOUS
  Administered 2017-03-24: 09:00:00 8 [IU] via SUBCUTANEOUS
  Filled 2017-03-23 (×2): qty 1

## 2017-03-23 MED ORDER — INSULIN DETEMIR 100 UNIT/ML ~~LOC~~ SOLN
10.0000 [IU] | Freq: Every day | SUBCUTANEOUS | Status: DC
  Administered 2017-03-23: 10 [IU] via SUBCUTANEOUS
  Filled 2017-03-23 (×3): qty 0.1

## 2017-03-23 MED ORDER — DEXTROSE-NACL 5-0.45 % IV SOLN
INTRAVENOUS | Status: DC
  Administered 2017-03-23: 01:00:00 via INTRAVENOUS

## 2017-03-23 MED ORDER — SODIUM PHOSPHATES 45 MMOLE/15ML IV SOLN
30.0000 mmol | Freq: Once | INTRAVENOUS | Status: AC
  Administered 2017-03-23: 30 mmol via INTRAVENOUS
  Filled 2017-03-23: qty 10

## 2017-03-23 MED ORDER — SODIUM CHLORIDE 0.9 % IV BOLUS (SEPSIS)
1000.0000 mL | INTRAVENOUS | Status: AC
  Administered 2017-03-23 (×2): 1000 mL via INTRAVENOUS

## 2017-03-23 MED ORDER — INSULIN ASPART 100 UNIT/ML ~~LOC~~ SOLN: 0.0000 [IU] | Freq: Every day | SUBCUTANEOUS | Status: DC

## 2017-03-23 NOTE — Progress Notes (Signed)
Blood glucose rechecked after giving orange juice and is 97.  Patient will get dinner tray.  Offered patient a snack and she refused at this time.

## 2017-03-23 NOTE — Consult Note (Signed)
PULMONARY / CRITICAL CARE MEDICINE   Name: Kristine Allison MRN: [id number] DOB: [birth year]    ADMISSION DATE:  03/22/2017   CONSULTATION DATE:  03/22/2017  REFERRING MD: Nemiah Commander  Reason for consultation: ICU management of DKA  CHIEF COMPLAINT:  Hypoglycemia  HISTORY OF PRESENT ILLNESS:   This is a 36 year old type I diabetic who presented to the ED via EMS with complaints of hypoglycemia and nausea and vomiting. History is obtained from ED records and partially from patient as patient is somnolent and mostly answers to yes no questions. Per ED records, patient's blood sugar was reading high at home and she reported that she had not been feeling well for the past 3 days and not been taking her insulin hence EMS was called. At the ED, patient was tachycardic, diminished responsiveness and hypotensive. She is being admitted to the ICU for severe DKA management  PAST MEDICAL HISTORY :  She  has a past medical history of Anxiety; Asthma; Diabetes mellitus without complication (HCC); and Headache(784.0).  PAST SURGICAL HISTORY: She  has a past surgical history that includes No past surgeries.  Allergies  Allergen Reactions  . Latex Rash    No current facility-administered medications on file prior to encounter.    Current Outpatient Prescriptions on File Prior to Encounter  Medication Sig  . aspirin EC 81 MG EC tablet Take 1 tablet (81 mg total) by mouth daily.  . insulin NPH-regular Human (NOVOLIN 70/30) (70-30) 100 UNIT/ML injection Inject 20 Units into the skin 2 (two) times daily with a meal. (Patient taking differently: Inject 25 Units into the skin 3 (three) times daily with meals. )    FAMILY HISTORY:  Her @FAMSTP (<SUBSCRIPT> error)@  SOCIAL HISTORY: She  reports that she has been smoking Cigarettes.  She has a 5.00 pack-year smoking history. She has never used smokeless tobacco. She reports that she does not drink alcohol or use drugs.  REVIEW OF SYSTEMS:   Unable  to obtain due to patient's diminished mental status  SUBJECTIVE:    VITAL SIGNS: BP (!) 86/51   Pulse (!) 113   Temp 99.9 F (37.7 C)   Resp 19   Ht 5\' 4"  (1.626 m)   Wt 112 lb 7 oz (51 kg)   SpO2 97%   BMI 19.30 kg/m   HEMODYNAMICS:    VENTILATOR SETTINGS:    INTAKE / OUTPUT: No intake/output data recorded.  PHYSICAL EXAMINATION: General:  Acutely ill-looking Neuro:  Somnolent, withdraws to pain, will follow some basic commands, moves all extremities HEENT:  PERRLA, no JVD, trachea midline Cardiovascular:  Tachycardic, S1, S2 audible, no murmur, regurg or gallop, +2 pulses bilaterally, no edema Lungs: Moderate increase in work of breathing, lungs are clear to auscultation bilaterally Abdomen: Nondistended, diffuse pain with light palpation Musculoskeletal:  No deformities, positive range of motion in upper and lower extremities Skin: Warm and dry  LABS:  BMET  Recent Labs Lab 03/22/17 1720 03/22/17 2219 03/23/17 0126  NA 130* 141 143  K 6.1* 3.8 3.8  CL 87* 111 113*  CO2 <7* 11* 17*  BUN 53* 42* 36*  CREATININE 2.91* 1.62* 1.39*  GLUCOSE 1,162* 397* 227*    Electrolytes  Recent Labs Lab 03/22/17 1720 03/22/17 2219 03/23/17 0126  CALCIUM 7.7* 7.1* 7.4*    CBC  Recent Labs Lab 03/22/17 1720  WBC 25.1*  HGB 13.4  HCT 45.5  PLT 311    Coag's No results for input(s): APTT, INR in the last 168  hours.  Sepsis Markers  Recent Labs Lab 03/22/17 1720 03/22/17 2219  LATICACIDVEN 4.3* 1.0    ABG No results for input(s): PHART, PCO2ART, PO2ART in the last 168 hours.  Liver Enzymes  Recent Labs Lab 03/22/17 1720  AST 19  ALT 15  ALKPHOS 118  BILITOT 2.2*  ALBUMIN 3.5    Cardiac Enzymes  Recent Labs Lab 03/22/17 1720  TROPONINI 0.04*    Glucose  Recent Labs Lab 03/22/17 2115 03/22/17 2219 03/22/17 2306 03/23/17 0003 03/23/17 0104 03/23/17 0217  GLUCAP 446* 421* 306* 255* 241* 184*    Imaging Dg Chest Port 1  View  Result Date: 03/22/2017 CLINICAL DATA:  36 year old female with fever and decreased responsiveness. EXAM: PORTABLE CHEST 1 VIEW COMPARISON:  10/02/2015. FINDINGS: Portable AP upright view at 1729 hours. Lung volumes are stable and within normal limits. Mediastinal contours remain normal. Allowing for portable technique the lungs are clear. No acute osseous abnormality identified. IMPRESSION: Negative.  No acute cardiopulmonary abnormality. Electronically Signed   By: Odessa FlemingH  Hall M.D.   On: 03/22/2017 17:50     STUDIES:  None  CULTURES: None  ANTIBIOTICS: Vancomycin Zosyn  SIGNIFICANT EVENTS: 70-09-2016: Admitted with DKA  LINES/TUBES: Peripheral IVs Foley  DISCUSSION: 36 year old Caucasian female presenting with DKA secondary to nonadherence  ASSESSMENT Diabetic ketoacidosis  Uncontrolled type 1 diabetes secondary to nonadherence. Questionable UTI History of asthma. History of anxiety. History of headache   PLAN IV insulin per DKA protocol IV fluids When necessary Zofran and Reglan for nausea and vomiting Sodium bicarbonate when necessary to maintain pH greater than 7.2 Continue current antibiotics Trend pro-calcitonin and if negative, discontinue antibiotics. Social service consult and diabetes care management consult for self-care abilities Monitor and replace electrolytes  FAMILY  - Updates: No family at bedside. We'll updated when available  - Inter-disciplinary family meet or Palliative Care meeting due by:  day 7    Magdalene S. [facility] ANP-BC Pulmonary and Critical Care Medicine [facility] Pager [phone] or [phone]  03/23/2017, 2:29 AM   Billy Fischeravid Adilen Pavelko, MD PCCM service Mobile [phone] Pager [phone] 03/23/2017 1:56 PM

## 2017-03-23 NOTE — Progress Notes (Signed)
Spoke with NP earlier that anion gap and CO2 closed. Wanted to have four consecutive CBG under 180. Pt has had 4 under 180, last one 105 with insulin gtt rate at 2.0. NP will put in transition orders.

## 2017-03-23 NOTE — Progress Notes (Signed)
[facility] called to inquire about low BP. Informed that IVF bolus in progress.

## 2017-03-23 NOTE — Progress Notes (Signed)
Report called to Malka, RN on 1C.  

## 2017-03-23 NOTE — Care Management (Signed)
RNCM attempted twice to meet with patient regarding discharge planning needs. Medicaid/financial team was at bedside the first time however I learned that [facility] is from [facility]. This RNCM left information regarding [facility] and [facility] along with [facility] [place]. I understood during our brief conversation that patient does work. I returned to bedside and patient was soundly sleeping. RNCM to continue to follow as patient "ran out of insulin" and she does not have health insurance. Referrals sent to both [facility].

## 2017-03-23 NOTE — Progress Notes (Signed)
Blood glucose 52, orange juice given.  Will recheck blood sugar.

## 2017-03-23 NOTE — Progress Notes (Addendum)
Inpatient Diabetes Program Recommendations  AACE/ADA: New Consensus Statement on Inpatient Glycemic Control (2015)  Target Ranges:  Prepandial:   less than 140 mg/dL      Peak postprandial:   less than 180 mg/dL (1-2 hours)      Critically ill patients:  140 - 180 mg/dL   Lab Results  Component Value Date   GLUCAP 117 (H) 03/23/2017   HGBA1C 12.6 (H) 07/25/2015    Review of Glycemic Control  Results for Crista ElliotSIMMONS COHN, Davon E (MRN [id number]) as of 03/23/2017 07:48  Ref. Range 03/23/2017 03:29 03/23/2017 04:43 03/23/2017 05:46 03/23/2017 06:46 03/23/2017 07:29  Glucose-Capillary Latest Ref Range: 65 - 99 mg/dL 409155 (H) 811153 (H) 914125 (H) 105 (H) 117 (H)   Results for Crista ElliotSIMMONS COHN, Murel E (MRN [id number]) as of 03/23/2017 07:48  Ref. Range 03/23/2017 03:50  Anion gap Latest Ref Range: 5 - 15  7   Results for Crista ElliotSIMMONS COHN, Darcy E (MRN [id number]) as of 03/23/2017 07:48  Ref. Range 03/23/2017 03:50  CO2 Latest Ref Range: 22 - 32 mmol/L 22    Diabetes history: Type 1- A1C pending Outpatient Diabetes medications: [facility] Reli-on NPH 70/30 12 units tid (as reported by patient) Current orders for Inpatient glycemic control: Levemir 10 units qhs, Novolog 3 units tid with meals, Novolog 0-9 units tid, Novolog 0-5 units qhs  Inpatient Diabetes Program Recommendations: Anion gap closed CO2 22, transition orders written.    Please give Levemir 10 units now and d/c insulin drip in 2 hours.  At the time the insulin drip is stopped, please give correction insulin 0-9 units per CBG at the time the drip is stopped.    Based on my conversation with the patient, [facility] per bottle.  She did not have the money (she gets paid today) to get more insulin and therefore she  ran out of insulin. She tells me she had only been out of insulin for 1 day.   Discussed being proactive and as soon as she opens her last bottle of insulin, to go and get her next bottle so she won't run out again in  the future.   Based on her home doses of insulin, it is likely that she will need Levemir 10 units bid while inpatient. (NPH 70/30 12 units tid = 25 units basal and 11 units rapid acting/day)   Susette RacerJulie Corrinne Benegas, RN, BA, MHA, CDE Diabetes Coordinator Inpatient Diabetes Program  [phone] (Team Pager) [phone] [facility] Office) 03/23/2017 7:55 AM

## 2017-03-23 NOTE — Progress Notes (Signed)
Called NP for order for low bp. 2 L IVF bolus ordered. 1st L infusing,

## 2017-03-23 NOTE — Progress Notes (Signed)
Pt had her 2 L lVF bolus. Informed NP that phosphorus less than 1 and Mg 1.6. Will order phosphorous and Mg.

## 2017-03-23 NOTE — Progress Notes (Signed)
[facility] - [place] at [facility]   PATIENT NAME: Kristine LuriaMargie Simmons Cohn    MR#:  [id number]  DATE OF BIRTH:  [birth year]  SUBJECTIVE:  CHIEF COMPLAINT:   Chief Complaint  Patient presents with  . Hyperglycemia   More awake than yesterday. Seems tired. Able to tell me her history and DM meds. Afebrile  REVIEW OF SYSTEMS:    Review of Systems  Constitutional: Positive for malaise/fatigue. Negative for chills and fever.  HENT: Negative for sore throat.   Eyes: Negative for blurred vision, double vision and pain.  Respiratory: Negative for cough, hemoptysis, shortness of breath and wheezing.   Cardiovascular: Negative for chest pain, palpitations, orthopnea and leg swelling.  Gastrointestinal: Negative for abdominal pain, constipation, diarrhea, heartburn, nausea and vomiting.  Genitourinary: Negative for dysuria and hematuria.  Musculoskeletal: Negative for back pain and joint pain.  Skin: Negative for rash.  Neurological: Positive for weakness. Negative for sensory change, speech change, focal weakness and headaches.  Endo/Heme/Allergies: Does not bruise/bleed easily.  Psychiatric/Behavioral: Negative for depression. The patient is not nervous/anxious.     DRUG ALLERGIES:   Allergies  Allergen Reactions  . Latex Rash    VITALS:  Blood pressure 90/63, pulse 88, temperature (!) 100.4 F (38 C), resp. rate 18, height 5\' 4"  (1.626 m), weight 51 kg (112 lb 7 oz), SpO2 99 %.  PHYSICAL EXAMINATION:   Physical Exam  GENERAL:  36 y.o.-year-old patient lying in the bed with no acute distress.  EYES: Pupils equal, round, reactive to light and accommodation. No scleral icterus. Extraocular muscles intact.  HEENT: Head atraumatic, normocephalic. Oropharynx and nasopharynx clear.  NECK:  Supple, no jugular venous distention. No thyroid enlargement, no tenderness.  LUNGS: Normal breath sounds bilaterally, no wheezing, rales, rhonchi. No use of accessory muscles of  respiration.  CARDIOVASCULAR: S1, S2 normal. No murmurs, rubs, or gallops.  ABDOMEN: Soft, nontender, nondistended. Bowel sounds present. No organomegaly or mass.  EXTREMITIES: No cyanosis, clubbing or edema b/l.    NEUROLOGIC: Cranial nerves II through XII are intact. No focal Motor or sensory deficits b/l.   PSYCHIATRIC: The patient is  oriented x 3.  SKIN: No obvious rash, lesion, or ulcer.   LABORATORY PANEL:   CBC  Recent Labs Lab 03/23/17 0350  WBC 12.2*  HGB 11.9*  HCT 34.2*  PLT 209   ------------------------------------------------------------------------------------------------------------------ Chemistries   Recent Labs Lab 03/22/17 1720  03/23/17 0350  NA 130*  < > 144  K 6.1*  < > 3.6  CL 87*  < > 115*  CO2 <7*  < > 22  GLUCOSE 1,162*  < > 168*  BUN 53*  < > 34*  CREATININE 2.91*  < > 1.10*  CALCIUM 7.7*  < > 7.6*  MG  --   --  1.6*  AST 19  --   --   ALT 15  --   --   ALKPHOS 118  --   --   BILITOT 2.2*  --   --   < > = values in this interval not displayed. ------------------------------------------------------------------------------------------------------------------  Cardiac Enzymes  Recent Labs Lab 03/22/17 1720  TROPONINI 0.04*   ------------------------------------------------------------------------------------------------------------------  RADIOLOGY:  Dg Chest Port 1 View  Result Date: 03/22/2017 CLINICAL DATA:  36 year old female with fever and decreased responsiveness. EXAM: PORTABLE CHEST 1 VIEW COMPARISON:  10/02/2015. FINDINGS: Portable AP upright view at 1729 hours. Lung volumes are stable and within normal limits. Mediastinal contours remain normal. Allowing for portable technique the lungs  are clear. No acute osseous abnormality identified. IMPRESSION: Negative.  No acute cardiopulmonary abnormality. Electronically Signed   By: Odessa Fleming M.D.   On: 03/22/2017 17:50     ASSESSMENT AND PLAN:   * DKA with uncontrolled DM DKA has  resolved. Transition to Levemir and sliding scale from insulin drip. Start diabetic diet. Continue IV fluids.  * Acute kidney injury due to severe dehydration and DKA. Improved with IV fluids. Monitor input and output closely.  * Leukocytosis likely due to DKA and stress reaction. Improved with IV fluids. No antibiotics needed. Cultures pending.  * Acute encephalopathy due to severe metabolic acidosis and acute kidney injury is improving.  * Hyperkalemia has resolved  * DVT prophylaxis. On heparin.  All the records are reviewed and case discussed with Care Management/Social Workerr. Management plans discussed with the patient, family and they are in agreement.  CODE STATUS: FULL CODE  DVT Prophylaxis: SCDs  TOTAL TIME TAKING CARE OF THIS PATIENT: 35 minutes.   POSSIBLE D/C IN 1-2 DAYS, DEPENDING ON CLINICAL CONDITION.  Milagros Loll R M.D on 03/23/2017 at 10:34 AM  Between 7am to 6pm - Pager - [phone]  After 6pm go to [email or web address] - password [email or web address] [facility]  [facility]  Office  [phone]  CC: Primary care physician; Doreene Eland, MD  Note: This dictation was prepared with Dragon dictation along with smaller phrase technology. Any transcriptional errors that result from this process are unintentional.

## 2017-03-23 NOTE — Progress Notes (Signed)
Pt is now off insulin gtt I do not see evidence of active infection DKA due to "running out of meds" DC'd ceftriaxone   Transfer to med-surg and PCCM will sign off  Billy Fischeravid Simonds, MD PCCM service Mobile [phone] Pager [phone] 03/23/2017 1:58 PM

## 2017-03-23 NOTE — Progress Notes (Signed)
Pt transferred around 1740 from ICU. Pt a&ox4. Lethargic, flat affect.Poor appetite. C/o nausea once, zofran given. No emesis. C/o chronic back pain, assisted pt repositioned with pillows and emotional support, but no improvement. Called, hospitalist, Dr Nemiah CommanderKalisetti, tramadol ordered. Notified Yakana, night RN to give pain med.

## 2017-03-24 LAB — GLUCOSE, CAPILLARY
GLUCOSE-CAPILLARY: 292 mg/dL — AB (ref 65–99)
GLUCOSE-CAPILLARY: 238 mg/dL — AB (ref 65–99)

## 2017-03-24 LAB — HIV ANTIBODY (ROUTINE TESTING W REFLEX): HIV Screen 4th Generation wRfx: NONREACTIVE

## 2017-03-24 LAB — HEMOGLOBIN A1C
Hgb A1c MFr Bld: 13.8 % — ABNORMAL HIGH (ref 4.8–5.6)
Mean Plasma Glucose: 349 mg/dL

## 2017-03-24 LAB — URINE CULTURE: Culture: 60000 — AB

## 2017-03-24 LAB — PHOSPHORUS: Phosphorus: 2.6 mg/dL (ref 2.5–4.6)

## 2017-03-24 MED ORDER — ADULT MULTIVITAMIN W/MINERALS CH: 1.0000 | ORAL_TABLET | Freq: Every day | ORAL | Status: DC

## 2017-03-24 MED ORDER — INSULIN DETEMIR 100 UNIT/ML ~~LOC~~ SOLN
8.0000 [IU] | Freq: Two times a day (BID) | SUBCUTANEOUS | Status: DC
  Administered 2017-03-24: 8 [IU] via SUBCUTANEOUS
  Filled 2017-03-24 (×3): qty 0.08

## 2017-03-24 MED ORDER — MENTHOL 3 MG MT LOZG
1.0000 | LOZENGE | OROMUCOSAL | Status: DC | PRN
  Filled 2017-03-24: qty 9

## 2017-03-24 NOTE — Care Management (Signed)
Discharge to home today per Dr. Elpidio AnisSudini, lives with mother, Larene BeachVickie, [phone]). Works at [employer].  [facility] Lum BabeEniola at [facility] [place]. 11/11/12.  Gwenette GreetBrenda S Jameya Pontiff RN MSN CCM Care Management [phone]

## 2017-03-24 NOTE — Progress Notes (Signed)
Met with patient. We discussed again the importance of being pro-active and always having insulin at home.  Patient also confides that she has been re-using syringes.  She currently has no more insulin syringes at home- her mother picked up insulin yesterday. She has been encouraged to purchase more insulin syringes and to NEVER re-use syringes.  Gentry Fitz, RN, BA, MHA, CDE Diabetes Coordinator Inpatient Diabetes Program  [phone] (Team Pager) [phone] ([place]) 03/24/2017 12:39 PM

## 2017-03-24 NOTE — Progress Notes (Signed)
[facility]         [place], [place]   03/24/2017  Patient: Kristine LuriaMargie Simmons Allison   Date of Birth:  1980/12/17  Date of admission:  03/22/2017  Date of Discharge  03/24/2017    To Whom it May Concern:   [name]  may return to work on 03/25/2017.  PHYSICAL ACTIVITY:  Full  If you have any questions or concerns, please don't hesitate to call.  Sincerely,   Milagros LollSudini, Kamilia Carollo R M.D Office : [phone]   .

## 2017-03-24 NOTE — Progress Notes (Signed)
Initial Nutrition Assessment  DOCUMENTATION CODES:   Not applicable  INTERVENTION:  Encouraged adequate intake of calories and protein at meals.  Encouraged intake of dietary sources of phosphorus (dairy, almonds, meat, legumes, whole grains). Phosphorus levels commonly decrease with correction of DKA. Should be transient and improve with improvement in PO intake. Will continue to monitor labs.  Recommend daily multivitamin with minerals.  NUTRITION DIAGNOSIS:   Inadequate oral intake related to poor appetite as evidenced by per patient/family report.  GOAL:   Patient will meet greater than or equal to 90% of their needs  MONITOR:   PO intake, Labs, Weight trends, I & O's  REASON FOR ASSESSMENT:   Diagnosis    ASSESSMENT:   36 year old female with PMHx of asthma, Type 1 DM, anxiety admitted with DKA, AKI due to severe dehydration and DKA.   Spoke with patient at bedside. She reports that her appetite was poor since 6/30 when she started not feeling well. She reports it is starting to improve today and she was able to finish all of her breakfast (eggs, bacon, orange juice). She reports she typically eats well. She works at [employer] and is able to snack on chicken and other foods during the day. Patient reports she is familiar with how to eat with diabetes and does not need any education. Per chart review her home insulin is Novolin 70/30, so she is not having to count carbohydrates to dose insulin. Just needs consistent intake of carbohydrates throughout the day. Patient reports she is unsure what her blood sugar typically is at home. Reports she had nausea yesterday, but denies any N/V, abdominal pain, constipation/diarrhea now.  Patient reports she does not weigh herself regularly, but she used to weigh 125 lbs. Pr chart patient was last 125 lbs in 2016, unsure how weight has changed this past year, but she has lost weight.  Meal Completion: 100% of breakfast this morning  (280 kcal, 16 grams of protein)  Medications reviewed and include: Novolog 0-15 units TID, Novolog 0-5 units QHS, Novolog 3 units TID, Levemir 8 units BID. Pt received sodium phosphate 30 mmol in D5 250 ml infusion yesterday once IV.  Labs reviewed: CBG 52-292 past 24 hrs. On 7/2 Phosphorus was <1. Pending repeat level today.  Nutrition-Focused physical exam completed. Findings are no fat depletion, no muscle depletion, and no edema. Patient with thin, sparse, patchy hair. She reports is has always been like that. May be related to deficiency of iron, zinc, biotin, or protein deficiency if no other medical causes. Patient complaining of muscle soreness/pain over entire body.   Patient does not meet criteria for malnutrition at this time.   Discussed with RN.  Diet Order:  Diet Carb Modified Fluid consistency: Thin; Room service appropriate? Yes  Skin:  Reviewed, no issues  Last BM:  03/24/2017 - type 7  Height:   Ht Readings from Last 1 Encounters:  03/22/17 5\' 4"  (1.626 m)    Weight:   Wt Readings from Last 1 Encounters:  03/22/17 112 lb 7 oz (51 kg)    Ideal Body Weight:  54.5 kg  BMI:  Body mass index is 19.3 kg/m.  Estimated Nutritional Needs:   Kcal:  1440-1675 (MSJ x 1.2-1.4)  Protein:  60-70 grams (1.2-1.4 grams/kg)  Fluid:  1.5-1.8 L/day (30-35 ml/kg)  EDUCATION NEEDS:   Education needs addressed  Helane RimaLeanne Soriya Worster, MS, RD, LDN Pager: [phone] After Hours Pager: [phone]

## 2017-03-24 NOTE — Progress Notes (Addendum)
Inpatient Diabetes Program Recommendations  AACE/ADA: New Consensus Statement on Inpatient Glycemic Control (2015)  Target Ranges:  Prepandial:   less than 140 mg/dL      Peak postprandial:   less than 180 mg/dL (1-2 hours)      Critically ill patients:  140 - 180 mg/dL   Lab Results  Component Value Date   GLUCAP 292 (H) 03/24/2017   HGBA1C 13.8 (H) 03/22/2017    Review of Glycemic Control  Results for Kristine Allison, Kristine Allison (MRN [id number]) as of 03/24/2017 08:41  Ref. Range 03/23/2017 16:39 03/23/2017 17:09 03/23/2017 18:00 03/23/2017 20:09 03/24/2017 07:45  Glucose-Capillary Latest Ref Range: 65 - 99 mg/dL 52 (L) 97 409111 (H) 81 811292 (H)   Diabetes history: Type 1- A1C 13.8% Outpatient Diabetes medications: [facility] Reli-on NPH 70/30 12 units tid (as reported by patient)  Current orders for Inpatient glycemic control: Levemir 10 units qhs , Novolog 3 units tid with meals, Novolog 0-15 units tid, Novolog 0-5 units qhs  Inpatient Diabetes Program Recommendations:  Patient did not have basal insulin ordered last night and no correction Novolog given at 2200.   Fasting CBG 292mg /dl.  Correction insulin parameters increased from 0-9 units  to 0-15 units therefore patient received 8 units of correction insulin this morning.   Consider changing basal Levemir insulin to 8 units bid.  (patient takes 25 units of basal insulin at home)  Consider decreasing Novolog correction to 0-9 units tid  Continue Novolog 0-5 units qhs.  Discussed with RN Meghan. Text paged MD at 8:54am   Susette RacerJulie Dreana Britz, RN, BA, MHA, CDE Diabetes Coordinator Inpatient Diabetes Program  [phone] (Team Pager) [phone] [facility] Office) 03/24/2017 8:50 AM

## 2017-03-24 NOTE — Discharge Instructions (Addendum)
Resume diet and activity as before  Check blood sugars 3 times before meals and at bedtime. Keep log and take your doctor's appointment.

## 2017-03-26 NOTE — Discharge Summary (Signed)
[facility] [place] [facility]   PATIENT NAME: Kristine Allison    MR#:  [id number]  DATE OF BIRTH:  [birth year]  DATE OF ADMISSION:  03/22/2017 ADMITTING PHYSICIAN: Enid Baas, MD  DATE OF DISCHARGE: 03/24/2017  3:35 PM  PRIMARY CARE PHYSICIAN: Doreene Eland, MD   ADMISSION DIAGNOSIS:  Elevated troponin I level [R74.8] Hypothermia, initial encounter [T68.XXXA] Diabetic ketoacidosis without coma associated with type 1 diabetes mellitus (HCC) [E10.10]  DISCHARGE DIAGNOSIS:  Active Problems:   DKA (diabetic ketoacidoses) (HCC)   SECONDARY DIAGNOSIS:   Past Medical History:  Diagnosis Date  . Anxiety   . Asthma   . Diabetes mellitus without complication (HCC)   . Headache(784.0)      ADMITTING HISTORY  HISTORY OF PRESENT ILLNESS:  Kristine Allison  is a 36 y.o. female with a known history of Type 1 diabetes mellitus with prior admissions for DKA, asthma comes to the hospital secondary to elevated sugars and lethargy at home. Patient is still lethargic, arousable and following some simple commands. Most of the history is obtained from calling her mother and also the ER physician. Patient had history of noncompliance with her insulin and prior ER visits for elevated sugars in the past. Talking to her mother, patient has been taking her insulin like she is supposed to. She says her illness started about 2 days ago when she was very weak, unable to keep any fluids down. She's been so dehydrated today that she was barely arousable and so EMS was called. Her blood sugars are in the 1100's here, with significant metabolic acidosis, hyperkalemia and acute renal failure. She is appearing critically ill at this time. Received 3 fluid boluses already and started on insulin drip and bicarbonate push given. Patient is being admitted to ICU.   HOSPITAL COURSE:   * DKA. This was due to missing insulin doses and noncompliance. Patient was treated in the ICU  with DKA protocol on IV insulin, IV fluids and frequent Accu-Cheks and BMP. Patient improved well. At the time of discharge patient was counseled to be compliant with insulin. Restart home dose of insulin 70/30 mix.  Acute kidney injury with hyperkalemia due to dehydration and DKA has resolved.  Acute metabolic encephalopathy due to severe metabolic acidosis and acute kidney injury has resolved   She did have leukocytosis on admission which resolved and is thought to be likely a stress reaction and hemoconcentration. Cultures negative.  She will follow-up with her primary care physician within a week. Discharged home in stable condition.  CONSULTS OBTAINED:    DRUG ALLERGIES:   Allergies  Allergen Reactions  . Latex Rash    DISCHARGE MEDICATIONS:   Discharge Medication List as of 03/24/2017  2:02 PM    CONTINUE these medications which have NOT CHANGED   Details  aspirin EC 81 MG EC tablet Take 1 tablet (81 mg total) by mouth daily., Starting 11/20/2013, Until Discontinued, Normal    insulin NPH-regular Human (NOVOLIN 70/30) (70-30) 100 UNIT/ML injection Inject 20 Units into the skin 2 (two) times daily with a meal., Starting 07/27/2015, Until Discontinued, Print        Today   VITAL SIGNS:  Blood pressure 114/71, pulse (!) 56, temperature 98.5 F (36.9 C), temperature source Oral, resp. rate 18, height 5\' 4"  (1.626 m), weight 51 kg (112 lb 7 oz), SpO2 97 %.  I/O:  No intake or output data in the 24 hours ending 03/26/17 1207  PHYSICAL EXAMINATION:  Physical Exam  GENERAL:  36 y.o.-year-old patient lying in the bed with no acute distress.  LUNGS: Normal breath sounds bilaterally, no wheezing, rales,rhonchi or crepitation. No use of accessory muscles of respiration.  CARDIOVASCULAR: S1, S2 normal. No murmurs, rubs, or gallops.  ABDOMEN: Soft, non-tender, non-distended. Bowel sounds present. No organomegaly or mass.  NEUROLOGIC: Moves all 4 extremities. PSYCHIATRIC: The  patient is alert and oriented x 3.  SKIN: No obvious rash, lesion, or ulcer.   DATA REVIEW:   CBC  Recent Labs Lab 03/23/17 0350  WBC 12.2*  HGB 11.9*  HCT 34.2*  PLT 209    Chemistries   Recent Labs Lab 03/22/17 1720  03/23/17 0350  NA 130*  < > 144  K 6.1*  < > 3.6  CL 87*  < > 115*  CO2 <7*  < > 22  GLUCOSE 1,162*  < > 168*  BUN 53*  < > 34*  CREATININE 2.91*  < > 1.10*  CALCIUM 7.7*  < > 7.6*  MG  --   --  1.6*  AST 19  --   --   ALT 15  --   --   ALKPHOS 118  --   --   BILITOT 2.2*  --   --   < > = values in this interval not displayed.  Cardiac Enzymes  Recent Labs Lab 03/22/17 1720  TROPONINI 0.04*    Microbiology Results  Results for orders placed or performed during the hospital encounter of 03/22/17  Urine culture     Status: Abnormal   Collection Time: 03/22/17  4:18 PM  Result Value Ref Range Status   Specimen Description URINE, RANDOM  Final   Special Requests NONE  Final   Culture (A)  Final    60,000 COLONIES/mL LACTOBACILLUS SPECIES Standardized susceptibility testing for this organism is not available. Performed at [facility], [address], [place], [place] [address]    Report Status 03/24/2017 FINAL  Final  Blood Culture (routine x 2)     Status: None (Preliminary result)   Collection Time: 03/22/17  5:20 PM  Result Value Ref Range Status   Specimen Description BLOOD LEFT ASSIST CONTROL  Final   Special Requests   Final    BOTTLES DRAWN AEROBIC AND ANAEROBIC Blood Culture results may not be optimal due to an excessive volume of blood received in culture bottles   Culture NO GROWTH 4 DAYS  Final   Report Status PENDING  Incomplete  Blood Culture (routine x 2)     Status: None (Preliminary result)   Collection Time: 03/22/17  5:20 PM  Result Value Ref Range Status   Specimen Description BLOOD RIGHT HAND  Final   Special Requests   Final    BOTTLES DRAWN AEROBIC AND ANAEROBIC Blood Culture results may not be optimal due  to an excessive volume of blood received in culture bottles   Culture NO GROWTH 4 DAYS  Final   Report Status PENDING  Incomplete  MRSA PCR Screening     Status: None   Collection Time: 03/22/17  9:45 PM  Result Value Ref Range Status   MRSA by PCR NEGATIVE NEGATIVE Final    Comment:        The GeneXpert MRSA Assay (FDA approved for NASAL specimens only), is one component of a comprehensive MRSA colonization surveillance program. It is not intended to diagnose MRSA infection nor to guide or monitor treatment for MRSA infections.  RADIOLOGY:  No results found.  Follow up with PCP in 1 week.  Management plans discussed with the patient, family and they are in agreement.  CODE STATUS:  Code Status History    Date Active Date Inactive Code Status Order ID Comments User Context   03/22/2017  9:40 PM 03/24/2017  6:42 PM Full Code [id number]  Enid BaasKalisetti, Radhika, MD Inpatient   10/02/2015  1:55 PM 10/03/2015  6:30 PM Full Code [id number]  Altamese DillingVachhani, Vaibhavkumar, MD Inpatient   09/25/2015  3:23 AM 09/25/2015  8:20 PM Full Code [id number]  Myra RudeSchmitz, Jeremy E, MD ED   07/25/2015  5:33 PM 07/27/2015  3:25 PM Full Code [id number]  Zannie CoveJoseph, Preetha, MD Inpatient   05/03/2015  3:22 PM 05/05/2015  4:34 PM Full Code [id number]  Onnie BoerEmokpae, Ejiroghene E, MD ED   02/09/2015 10:56 PM 02/11/2015  4:41 PM Full Code [id number]  Raliegh IpGottschalk, Ashly M, DO Inpatient   12/21/2014  8:43 AM 12/23/2014  8:53 PM Full Code [id number]  Bernadene PersonWhiteheart, Kathryn A, NP ED   08/20/2014  5:43 AM 08/22/2014  7:03 PM Full Code [id number]  Oralia ManisWillis, David, MD Inpatient   12/02/2013  2:10 AM 12/03/2013  5:00 PM Full Code [id number]  Leona Singletonhekkekandam, Maria T, MD Inpatient   11/15/2013  3:32 PM 11/20/2013  4:31 PM Full Code [id number]  Jeanella Crazellis, Brandi L, NP ED   07/26/2013 10:59 PM 07/29/2013  6:30 PM Full Code [id number]  Charm RingsHonig, Erin J, MD Inpatient   06/05/2013 11:38 PM 06/05/2013 11:38 PM Full Code [id number]  Glori LuisSonnenberg, Eric G, MD Inpatient   06/05/2013 11:38 PM 06/07/2013   6:22 PM Full Code [id number]  Glori LuisSonnenberg, Eric G, MD Inpatient      TOTAL TIME TAKING CARE OF THIS PATIENT ON DAY OF DISCHARGE: more than 30 minutes.   Milagros LollSudini, Shankar Silber R M.D on 03/26/2017 at 12:07 PM  Between 7am to 6pm - Pager - [phone]  After 6pm go to [email or web address] - password [email or web address] [facility]  [facility] [place] [facility]  Office  [phone]  CC: Primary care physician; Doreene ElandEniola, Kehinde T, MD  Note: This dictation was prepared with Dragon dictation along with smaller phrase technology. Any transcriptional errors that result from this process are unintentional.

## 2017-03-27 LAB — CULTURE, BLOOD (ROUTINE X 2)
CULTURE: NO GROWTH
Culture: NO GROWTH

## 2018-01-31 ENCOUNTER — Encounter: Payer: Self-pay | Admitting: Emergency Medicine

## 2018-01-31 ENCOUNTER — Emergency Department
Admission: EM | Admit: 2018-01-31 | Discharge: 2018-01-31 | Disposition: A | Discharge status: Home / Self Care | Payer: Self-pay | Attending: Student in an Organized Health Care Education/Training Program | Admitting: Student in an Organized Health Care Education/Training Program

## 2018-01-31 DIAGNOSIS — R000 Tachycardia, unspecified: Secondary | ICD-10-CM | POA: Insufficient documentation

## 2018-01-31 DIAGNOSIS — J45909 Unspecified asthma, uncomplicated: Secondary | ICD-10-CM | POA: Insufficient documentation

## 2018-01-31 DIAGNOSIS — E860 Dehydration: Secondary | ICD-10-CM | POA: Insufficient documentation

## 2018-01-31 DIAGNOSIS — F17210 Nicotine dependence, cigarettes, uncomplicated: Secondary | ICD-10-CM | POA: Insufficient documentation

## 2018-01-31 DIAGNOSIS — R110 Nausea: Secondary | ICD-10-CM

## 2018-01-31 DIAGNOSIS — Z91040 Latex allergy status: Secondary | ICD-10-CM | POA: Insufficient documentation

## 2018-01-31 DIAGNOSIS — Z7982 Long term (current) use of aspirin: Secondary | ICD-10-CM | POA: Insufficient documentation

## 2018-01-31 DIAGNOSIS — E1010 Type 1 diabetes mellitus with ketoacidosis without coma: Secondary | ICD-10-CM | POA: Insufficient documentation

## 2018-01-31 LAB — URINALYSIS, COMPLETE (UACMP) WITH MICROSCOPIC
Bilirubin Urine: NEGATIVE
GLUCOSE, UA: NEGATIVE mg/dL
KETONES UR: 20 mg/dL — AB
NITRITE: NEGATIVE
PH: 6 (ref 5.0–8.0)
Protein, ur: NEGATIVE mg/dL
SPECIFIC GRAVITY, URINE: 1.01 (ref 1.005–1.030)

## 2018-01-31 LAB — COMPREHENSIVE METABOLIC PANEL
ALBUMIN: 4.4 g/dL (ref 3.5–5.0)
ALK PHOS: 85 U/L (ref 38–126)
ALT: 14 U/L (ref 14–54)
ANION GAP: 14 (ref 5–15)
AST: 17 U/L (ref 15–41)
BUN: 25 mg/dL — AB (ref 6–20)
CALCIUM: 9.6 mg/dL (ref 8.9–10.3)
CO2: 22 mmol/L (ref 22–32)
CREATININE: 0.82 mg/dL (ref 0.44–1.00)
Chloride: 95 mmol/L — ABNORMAL LOW (ref 101–111)
GFR calc Af Amer: >60 mL/min (ref >60)
GFR calc non Af Amer: >60 mL/min (ref >60)
GLUCOSE: 138 mg/dL — AB (ref 65–99)
Potassium: 3.7 mmol/L (ref 3.5–5.1)
SODIUM: 131 mmol/L — AB (ref 135–145)
TOTAL PROTEIN: 8 g/dL (ref 6.5–8.1)
Total Bilirubin: 1.8 mg/dL — ABNORMAL HIGH (ref 0.3–1.2)

## 2018-01-31 LAB — CBC
HCT: 47.9 % — ABNORMAL HIGH (ref 35.0–47.0)
HEMOGLOBIN: 16.2 g/dL — AB (ref 12.0–16.0)
MCH: 30.9 pg (ref 26.0–34.0)
MCHC: 33.9 g/dL (ref 32.0–36.0)
MCV: 91.2 fL (ref 80.0–100.0)
PLATELETS: 266 10*3/uL (ref 150–440)
RBC: 5.25 MIL/uL — ABNORMAL HIGH (ref 3.80–5.20)
RDW: 13.7 % (ref 11.5–14.5)
WBC: 7.3 10*3/uL (ref 3.6–11.0)

## 2018-01-31 LAB — LIPASE, BLOOD: Lipase: 20 U/L (ref 11–51)

## 2018-01-31 LAB — GLUCOSE, CAPILLARY: GLUCOSE-CAPILLARY: 136 mg/dL — AB (ref 65–99)

## 2018-01-31 MED ORDER — PROMETHAZINE HCL 25 MG/ML IJ SOLN
12.5000 mg | Freq: Four times a day (QID) | INTRAMUSCULAR | Status: DC | PRN
Start: 2018-01-31 — End: 2018-01-31
  Administered 2018-01-31: 12.5 mg via INTRAVENOUS
  Filled 2018-01-31: qty 1

## 2018-01-31 MED ORDER — SODIUM CHLORIDE 0.9 % IV BOLUS
1000.0000 mL | Freq: Once | INTRAVENOUS | Status: AC
  Administered 2018-01-31: 1000 mL via INTRAVENOUS

## 2018-01-31 MED ORDER — PROMETHAZINE HCL 12.5 MG PO TABS: 12.5000 mg | ORAL_TABLET | Freq: Four times a day (QID) | ORAL | 0 refills | Status: DC | PRN

## 2018-01-31 NOTE — ED Triage Notes (Signed)
Patient reports her blood sugar has been high over the past couple of days, in the 400s.  Patient states her blood sugar is better today and she is feeling somewhat better but states, "I still feel like I kind of need fluids, like I'm dehydrated."  Patient reports drinking 3 liters of pedialyte over the past few days.

## 2018-01-31 NOTE — ED Provider Notes (Signed)
[facility] Emergency Department Provider Note    First MD Initiated Contact with Patient 01/31/18 1623     (approximate)  I have reviewed the triage vital signs and the nursing notes.   HISTORY  Chief Complaint Diabetes and Nausea    HPI Kristine Allison is a 36 y.o. female with a history of diabetes with previous admissions to the hospital for DKA presents to the ER for high blood sugars over the past few days with nausea no vomiting but feeling dehydrated.  States that she feels sluggish.  She is been able to eat and drink has been pushing Pedialyte the past few days and feels better today than she did the last 2 days.  She has been taking her insulin setting issues.  Denies any fevers.  No flank pain.  No chest pain or shortness of breath.  No abdominal pain.  No dysuria or increased urinary frequency.  Past Medical History:  Diagnosis Date  . Anxiety   . Asthma   . Diabetes mellitus without complication (HCC)   . Headache(784.0)    Family History  Problem Relation Age of Onset  . Heart disease Unknown        No family history   Past Surgical History:  Procedure Laterality Date  . NO PAST SURGERIES     Patient Active Problem List   Diagnosis Date Noted  . Dehydration   . Esophageal reflux   . Diabetic ketoacidosis without coma associated with type 1 diabetes mellitus (HCC)   . DKA (diabetic ketoacidoses) (HCC) 05/03/2015  . Yeast infection 02/09/2015  . Acute renal failure syndrome (HCC)   . Tachycardia 11/03/2014  . Insomnia 09/08/2014  . Dizziness 09/08/2014  . Migraine, unspecified 12/14/2013  . Elevated troponin I level 11/19/2013  . Acute myocardial infarction, subendocardial infarction, initial episode of care (HCC) 11/18/2013  . Noncompliance with medication regimen 11/15/2013  . Leg pain 09/08/2013  . Loss of weight 09/08/2013  . Depression 09/08/2013  . Hypotension, unspecified 09/08/2013  . Tobacco use disorder  08/01/2013  . Diabetes mellitus type 1, uncontrolled (HCC) 06/24/2012  . GERD (gastroesophageal reflux disease) 06/17/2012      Prior to Admission medications   Medication Sig Start Date End Date Taking? Authorizing Provider  aspirin EC 81 MG EC tablet Take 1 tablet (81 mg total) by mouth daily. 11/20/13   Nani Ravens, MD  insulin NPH-regular Human (NOVOLIN 70/30) (70-30) 100 UNIT/ML injection Inject 20 Units into the skin 2 (two) times daily with a meal. Patient taking differently: Inject 12 Units into the skin 3 (three) times daily with meals.  07/27/15   Esperanza Sheets, MD  promethazine (PHENERGAN) 12.5 MG tablet Take 1 tablet (12.5 mg total) by mouth every 6 (six) hours as needed for nausea or vomiting. 01/31/18   Willy Eddy, MD    Allergies Latex    Social History Social History   Tobacco Use  . Smoking status: Current Every Day Smoker    Packs/day: 0.25    Years: 20.00    Pack years: 5.00    Types: Cigarettes  . Smokeless tobacco: Never Used  Substance Use Topics  . Alcohol use: No    Alcohol/week: 0.0 oz  . Drug use: No    Review of Systems Patient denies headaches, rhinorrhea, blurry vision, numbness, shortness of breath, chest pain, edema, cough, abdominal pain, nausea, vomiting, diarrhea, dysuria, fevers, rashes or hallucinations unless otherwise stated above in HPI. ____________________________________________  PHYSICAL EXAM:  VITAL SIGNS: Vitals:   01/31/18 1532 01/31/18 1738  BP: 94/72   Pulse: (!) 126 79  Resp: 16 17  Temp: 98.6 F (37 C)   SpO2: 97% 97%    Constitutional: Alert and oriented. Well appearing and in no acute distress. Eyes: Conjunctivae are normal.  Head: Atraumatic. Nose: No congestion/rhinnorhea. Mouth/Throat: Mucous membranes are moist.   Neck: No stridor. Painless ROM.  Cardiovascular  Mild tachycardia rate, regular rhythm. Grossly normal heart sounds.  Good peripheral circulation. Respiratory: Normal respiratory  effort.  No retractions. Lungs CTAB. Gastrointestinal: Soft and nontender. No distention. No abdominal bruits. No CVA tenderness. Genitourinary: deferred Musculoskeletal: No lower extremity tenderness nor edema.  No joint effusions. Neurologic:  Normal speech and language. No gross focal neurologic deficits are appreciated. No facial droop Skin:  Skin is warm, dry and intact. No rash noted. Psychiatric: Mood and affect are normal. Speech and behavior are normal.  ____________________________________________   LABS (all labs ordered are listed, but only abnormal results are displayed)  Results for orders placed or performed during the hospital encounter of 01/31/18 (from the past 24 hour(s))  Glucose, capillary     Status: Abnormal   Collection Time: 01/31/18  3:43 PM  Result Value Ref Range   Glucose-Capillary 136 (H) 65 - 99 mg/dL  Lipase, blood     Status: None   Collection Time: 01/31/18  3:44 PM  Result Value Ref Range   Lipase 20 11 - 51 U/L  Comprehensive metabolic panel     Status: Abnormal   Collection Time: 01/31/18  3:44 PM  Result Value Ref Range   Sodium 131 (L) 135 - 145 mmol/L   Potassium 3.7 3.5 - 5.1 mmol/L   Chloride 95 (L) 101 - 111 mmol/L   CO2 22 22 - 32 mmol/L   Glucose, Bld 138 (H) 65 - 99 mg/dL   BUN 25 (H) 6 - 20 mg/dL   Creatinine, Ser 0.27 0.44 - 1.00 mg/dL   Calcium 9.6 8.9 - 25.3 mg/dL   Total Protein 8.0 6.5 - 8.1 g/dL   Albumin 4.4 3.5 - 5.0 g/dL   AST 17 15 - 41 U/L   ALT 14 14 - 54 U/L   Alkaline Phosphatase 85 38 - 126 U/L   Total Bilirubin 1.8 (H) 0.3 - 1.2 mg/dL   GFR calc non Af Amer >60 >60 mL/min   GFR calc Af Amer >60 >60 mL/min   Anion gap 14 5 - 15  CBC     Status: Abnormal   Collection Time: 01/31/18  3:44 PM  Result Value Ref Range   WBC 7.3 3.6 - 11.0 K/uL   RBC 5.25 (H) 3.80 - 5.20 MIL/uL   Hemoglobin 16.2 (H) 12.0 - 16.0 g/dL   HCT 66.4 (H) 40.3 - 47.4 %   MCV 91.2 80.0 - 100.0 fL   MCH 30.9 26.0 - 34.0 pg   MCHC 33.9  32.0 - 36.0 g/dL   RDW 25.9 56.3 - 87.5 %   Platelets 266 150 - 440 K/uL  Urinalysis, Complete w Microscopic     Status: Abnormal   Collection Time: 01/31/18  3:44 PM  Result Value Ref Range   Color, Urine YELLOW (A) YELLOW   APPearance HAZY (A) CLEAR   Specific Gravity, Urine 1.010 1.005 - 1.030   pH 6.0 5.0 - 8.0   Glucose, UA NEGATIVE NEGATIVE mg/dL   Hgb urine dipstick SMALL (A) NEGATIVE   Bilirubin Urine NEGATIVE NEGATIVE  Ketones, ur 20 (A) NEGATIVE mg/dL   Protein, ur NEGATIVE NEGATIVE mg/dL   Nitrite NEGATIVE NEGATIVE   Leukocytes, UA MODERATE (A) NEGATIVE   RBC / HPF 0-5 0 - 5 RBC/hpf   WBC, UA 6-10 0 - 5 WBC/hpf   Bacteria, UA FEW (A) NONE SEEN   Squamous Epithelial / LPF 11-20 0 - 5   Mucus PRESENT    Hyaline Casts, UA PRESENT    ____________________________________________  _____________________________________________   PROCEDURES  Procedure(s) performed:  Procedures    Critical Care performed: no ____________________________________________   INITIAL IMPRESSION / ASSESSMENT AND PLAN / ED COURSE  Pertinent labs & imaging results that were available during my care of the patient were reviewed by me and considered in my medical decision making (see chart for details).  DDX: dehydration, dka, uti, electrolyte abn, hyperglycemia, hhns  Kristine Allison is a 37 y.o. who presents to the ED with nausea and feeling of dehydration.  Patient with mild tachycardia.  Blood work sent for the above differential shows no evidence of DKA.  Abdominal exam is soft and benign.  Will give IV fluids and IV antiemetics and reassess.  Clinical Course as of Feb 01 1808  Wynelle Link Jan 31, 2018  1747 Symptoms improved.  Heart rate improved.  Patient tolerating oral hydration.  Will send urine for culture she is not having any symptoms of urinary tract infection and it is a dirty specimen.   [PR]  1806 Patient otherwise well-appearing in no acute distress.  Not consistent with  DKA she has normal anion gap.  Patient was able to tolerate PO and was able to ambulate with a steady gait.  Have discussed with the patient and available family all diagnostics and treatments performed thus far and all questions were answered to the best of my ability. The patient demonstrates understanding and agreement with plan.    [PR]    Clinical Course User Index [PR] Willy Eddy, MD     As part of my medical decision making, I reviewed the following data within the electronic MEDICAL RECORD NUMBER Nursing notes reviewed and incorporated, Labs reviewed, notes from prior ED visits and [place] Controlled Substance Database   ____________________________________________   FINAL CLINICAL IMPRESSION(S) / ED DIAGNOSES  Final diagnoses:  Nausea  Dehydration      NEW MEDICATIONS STARTED DURING THIS VISIT:  New Prescriptions   PROMETHAZINE (PHENERGAN) 12.5 MG TABLET    Take 1 tablet (12.5 mg total) by mouth every 6 (six) hours as needed for nausea or vomiting.     Note:  This document was prepared using Dragon voice recognition software and may include unintentional dictation errors.     Willy Eddy, MD 01/31/18 1810

## 2018-02-01 ENCOUNTER — Encounter: Payer: Self-pay | Admitting: Family Medicine

## 2018-02-01 NOTE — Progress Notes (Signed)
Patient had not been to the clinic in more than 3 yrs. A reached out to her via certified letter. I also called and left an HIPPA compliant message for her to call back.  If she does not schedule follow-up soon, we will take her off our patient panel.

## 2018-02-02 LAB — URINE CULTURE

## 2018-03-08 ENCOUNTER — Telehealth: Payer: Self-pay | Admitting: Family Medicine

## 2018-03-08 NOTE — Telephone Encounter (Signed)
I attempted to reach her again to schedule follow-up appointment. Message left to call back.   At this point, I will take her off our patients' panelist.

## 2018-03-24 ENCOUNTER — Inpatient Hospital Stay
Admission: EM | Admit: 2018-03-24 | Discharge: 2018-03-26 | DRG: 638 | Disposition: A | Discharge status: Home / Self Care | Payer: Self-pay | Attending: Internal Medicine | Admitting: Internal Medicine

## 2018-03-24 ENCOUNTER — Encounter: Payer: Self-pay | Admitting: Emergency Medicine

## 2018-03-24 ENCOUNTER — Other Ambulatory Visit: Payer: Self-pay

## 2018-03-24 ENCOUNTER — Emergency Department: Payer: Self-pay

## 2018-03-24 DIAGNOSIS — Z91040 Latex allergy status: Secondary | ICD-10-CM

## 2018-03-24 DIAGNOSIS — K148 Other diseases of tongue: Secondary | ICD-10-CM | POA: Diagnosis present

## 2018-03-24 DIAGNOSIS — R220 Localized swelling, mass and lump, head: Secondary | ICD-10-CM | POA: Diagnosis present

## 2018-03-24 DIAGNOSIS — E875 Hyperkalemia: Secondary | ICD-10-CM | POA: Diagnosis present

## 2018-03-24 DIAGNOSIS — E1010 Type 1 diabetes mellitus with ketoacidosis without coma: Principal | ICD-10-CM | POA: Diagnosis present

## 2018-03-24 DIAGNOSIS — N179 Acute kidney failure, unspecified: Secondary | ICD-10-CM | POA: Diagnosis present

## 2018-03-24 DIAGNOSIS — E874 Mixed disorder of acid-base balance: Secondary | ICD-10-CM | POA: Diagnosis present

## 2018-03-24 DIAGNOSIS — F17210 Nicotine dependence, cigarettes, uncomplicated: Secondary | ICD-10-CM | POA: Diagnosis present

## 2018-03-24 DIAGNOSIS — J45909 Unspecified asthma, uncomplicated: Secondary | ICD-10-CM | POA: Diagnosis present

## 2018-03-24 DIAGNOSIS — N390 Urinary tract infection, site not specified: Secondary | ICD-10-CM | POA: Diagnosis present

## 2018-03-24 DIAGNOSIS — E1011 Type 1 diabetes mellitus with ketoacidosis with coma: Secondary | ICD-10-CM

## 2018-03-24 LAB — COMPREHENSIVE METABOLIC PANEL
ALBUMIN: 4.6 g/dL (ref 3.5–5.0)
ALT: 18 U/L (ref 0–44)
AST: 22 U/L (ref 15–41)
Alkaline Phosphatase: 117 U/L (ref 38–126)
Anion gap: 33 — ABNORMAL HIGH (ref 5–15)
BUN: 33 mg/dL — AB (ref 6–20)
CO2: 9 mmol/L — AB (ref 22–32)
CREATININE: 1.3 mg/dL — AB (ref 0.44–1.00)
Calcium: 9.3 mg/dL (ref 8.9–10.3)
Chloride: 94 mmol/L — ABNORMAL LOW (ref 98–111)
GFR calc Af Amer: >60 mL/min (ref >60)
GFR, EST NON AFRICAN AMERICAN: 52 mL/min — AB (ref >60)
GLUCOSE: 558 mg/dL — AB (ref 70–99)
POTASSIUM: 5 mmol/L (ref 3.5–5.1)
Sodium: 136 mmol/L (ref 135–145)
Total Bilirubin: 2.7 mg/dL — ABNORMAL HIGH (ref 0.3–1.2)
Total Protein: 8.1 g/dL (ref 6.5–8.1)

## 2018-03-24 LAB — CBC WITH DIFFERENTIAL/PLATELET
BASOS ABS: 0.1 10*3/uL (ref 0–0.1)
BASOS PCT: 1 %
Eosinophils Absolute: 0 10*3/uL (ref 0–0.7)
Eosinophils Relative: 0 %
HCT: 49.1 % — ABNORMAL HIGH (ref 35.0–47.0)
Hemoglobin: 16.3 g/dL — ABNORMAL HIGH (ref 12.0–16.0)
LYMPHS PCT: 5 %
Lymphs Abs: 0.7 10*3/uL — ABNORMAL LOW (ref 1.0–3.6)
MCH: 32.1 pg (ref 26.0–34.0)
MCHC: 33.3 g/dL (ref 32.0–36.0)
MCV: 96.5 fL (ref 80.0–100.0)
Monocytes Absolute: 0.8 10*3/uL (ref 0.2–0.9)
Monocytes Relative: 5 %
NEUTROS ABS: 12.5 10*3/uL — AB (ref 1.4–6.5)
Neutrophils Relative %: 89 %
PLATELETS: 326 10*3/uL (ref 150–440)
RBC: 5.09 MIL/uL (ref 3.80–5.20)
RDW: 14.1 % (ref 11.5–14.5)
WBC: 14 10*3/uL — AB (ref 3.6–11.0)

## 2018-03-24 LAB — BLOOD GAS, VENOUS
PATIENT TEMPERATURE: 37
PO2 VEN: 49 mmHg — AB (ref 32.0–45.0)
pCO2, Ven: 28 mmHg — ABNORMAL LOW (ref 44.0–60.0)
pH, Ven: 7.03 — CL (ref 7.250–7.430)

## 2018-03-24 LAB — HEMOGLOBIN A1C
HEMOGLOBIN A1C: 12.6 % — AB (ref 4.8–5.6)
Mean Plasma Glucose: 314.92 mg/dL

## 2018-03-24 LAB — BETA-HYDROXYBUTYRIC ACID: Beta-Hydroxybutyric Acid: >8 mmol/L — ABNORMAL HIGH (ref 0.05–0.27)

## 2018-03-24 LAB — GLUCOSE, CAPILLARY
GLUCOSE-CAPILLARY: 549 mg/dL — AB (ref 70–99)
GLUCOSE-CAPILLARY: 425 mg/dL — AB (ref 70–99)
GLUCOSE-CAPILLARY: 278 mg/dL — AB (ref 70–99)
GLUCOSE-CAPILLARY: 201 mg/dL — AB (ref 70–99)
GLUCOSE-CAPILLARY: 168 mg/dL — AB (ref 70–99)
GLUCOSE-CAPILLARY: 173 mg/dL — AB (ref 70–99)
Glucose-Capillary: 319 mg/dL — ABNORMAL HIGH (ref 70–99)
Glucose-Capillary: 155 mg/dL — ABNORMAL HIGH (ref 70–99)
Glucose-Capillary: 164 mg/dL — ABNORMAL HIGH (ref 70–99)
Glucose-Capillary: 172 mg/dL — ABNORMAL HIGH (ref 70–99)

## 2018-03-24 LAB — BASIC METABOLIC PANEL
Anion gap: 15 (ref 5–15)
BUN: 27 mg/dL — ABNORMAL HIGH (ref 6–20)
CHLORIDE: 108 mmol/L (ref 98–111)
CO2: 16 mmol/L — AB (ref 22–32)
Calcium: 8.4 mg/dL — ABNORMAL LOW (ref 8.9–10.3)
Creatinine, Ser: 1.03 mg/dL — ABNORMAL HIGH (ref 0.44–1.00)
GFR calc Af Amer: >60 mL/min (ref >60)
GFR calc non Af Amer: >60 mL/min (ref >60)
Glucose, Bld: 204 mg/dL — ABNORMAL HIGH (ref 70–99)
POTASSIUM: 5.1 mmol/L (ref 3.5–5.1)
SODIUM: 139 mmol/L (ref 135–145)

## 2018-03-24 LAB — TROPONIN I

## 2018-03-24 LAB — MRSA PCR SCREENING: MRSA by PCR: NEGATIVE

## 2018-03-24 MED ORDER — SODIUM CHLORIDE 0.9 % IV SOLN: INTRAVENOUS | Status: DC

## 2018-03-24 MED ORDER — SODIUM CHLORIDE 0.9 % IV SOLN
INTRAVENOUS | Status: DC
  Administered 2018-03-24: 4.9 [IU]/h via INTRAVENOUS
  Filled 2018-03-24: qty 1

## 2018-03-24 MED ORDER — SODIUM CHLORIDE 0.9 % IV SOLN
Freq: Once | INTRAVENOUS | Status: AC
  Administered 2018-03-24: 13:00:00 via INTRAVENOUS

## 2018-03-24 MED ORDER — ONDANSETRON HCL 4 MG/2ML IJ SOLN
4.0000 mg | Freq: Once | INTRAMUSCULAR | Status: AC
  Administered 2018-03-24: 4 mg via INTRAVENOUS
  Filled 2018-03-24: qty 2

## 2018-03-24 MED ORDER — SODIUM CHLORIDE 0.9 % IV SOLN
Freq: Once | INTRAVENOUS | Status: AC
  Administered 2018-03-24: 15:00:00 via INTRAVENOUS

## 2018-03-24 MED ORDER — ONDANSETRON HCL 4 MG/2ML IJ SOLN
4.0000 mg | Freq: Four times a day (QID) | INTRAMUSCULAR | Status: DC | PRN
  Administered 2018-03-24 – 2018-03-25 (×2): 4 mg via INTRAVENOUS
  Filled 2018-03-24 (×2): qty 2

## 2018-03-24 MED ORDER — POTASSIUM CHLORIDE 10 MEQ/100ML IV SOLN
10.0000 meq | INTRAVENOUS | Status: AC
  Administered 2018-03-24 (×2): 10 meq via INTRAVENOUS
  Filled 2018-03-24 (×2): qty 100

## 2018-03-24 MED ORDER — DEXTROSE-NACL 5-0.45 % IV SOLN
INTRAVENOUS | Status: DC
  Administered 2018-03-24: 100 mL/h via INTRAVENOUS

## 2018-03-24 MED ORDER — INSULIN REGULAR HUMAN 100 UNIT/ML IJ SOLN: INTRAMUSCULAR | Status: DC

## 2018-03-24 MED ORDER — FAMOTIDINE IN NACL 20-0.9 MG/50ML-% IV SOLN
20.0000 mg | Freq: Two times a day (BID) | INTRAVENOUS | Status: DC
  Administered 2018-03-24 – 2018-03-26 (×4): 20 mg via INTRAVENOUS
  Filled 2018-03-24 (×4): qty 50

## 2018-03-24 MED ORDER — MORPHINE SULFATE (PF) 4 MG/ML IV SOLN
4.0000 mg | Freq: Once | INTRAVENOUS | Status: AC
  Administered 2018-03-24: 4 mg via INTRAVENOUS
  Filled 2018-03-24: qty 1

## 2018-03-24 NOTE — Care Management (Signed)
Message sent to [facility] and [facility] to see if patient ever got connected with them for medical help. A referral was made last year to both clinics.

## 2018-03-24 NOTE — ED Notes (Signed)
Date and time results received: 03/24/18 1350 (use smartphrase ".now" to insert current time)  Test: pH Critical Value: 7.03  Name of Provider Notified: Mayford KnifeWilliams  Orders Received? Or Actions Taken?: Orders Received - See Orders for details

## 2018-03-24 NOTE — Consult Note (Signed)
Reason for Consult:DKA Referring Physician: Dr. Lewis Moccasin Kristine Allison is an 37 y.o. female.  HPI: Mrs. Kristine Allison is a 37 year old female with a past medical history of type 1 diabetes, asthma, anxiety presented with several days of increasing weakness, nausea, vomiting, achiness,weakness, and emergency department was noted to have an elevated blood sugar greater than 549, white count of 14, venous pH is 7.03, anion gap of 33 with a CO2 of 9. She states that she has been taking her insulin and denies any recent infection. She denied any urinary tract symptoms or symptoms of bronchitis. Presently resting in the emergency department, is easily arousable, tachycardic  Past Medical History:  Diagnosis Date  . Anxiety   . Asthma   . Diabetes mellitus without complication ([place])   . YYTKPTWS(568.1)     Past Surgical History:  Procedure Laterality Date  . NO PAST SURGERIES      Family History  Problem Relation Age of Onset  . Heart disease Unknown        No family history    Social History:  reports that she has been smoking cigarettes.  She has a 5.00 pack-year smoking history. She has never used smokeless tobacco. She reports that she does not drink alcohol or use drugs.  Allergies:  Allergies  Allergen Reactions  . Latex Rash    Medications: I have reviewed the patient's current medications.  Results for orders placed or performed during the hospital encounter of 03/24/18 (from the past 48 hour(s))  CBC with Differential     Status: Abnormal   Collection Time: 03/24/18  1:15 PM  Result Value Ref Range   WBC 14.0 (H) 3.6 - 11.0 K/uL   RBC 5.09 3.80 - 5.20 MIL/uL   Hemoglobin 16.3 (H) 12.0 - 16.0 g/dL   HCT 49.1 (H) 35.0 - 47.0 %   MCV 96.5 80.0 - 100.0 fL   MCH 32.1 26.0 - 34.0 pg   MCHC 33.3 32.0 - 36.0 g/dL   RDW 14.1 11.5 - 14.5 %   Platelets 326 150 - 440 K/uL   Neutrophils Relative % 89 %   Neutro Abs 12.5 (H) 1.4 - 6.5 K/uL   Lymphocytes Relative 5 %   Lymphs  Abs 0.7 (L) 1.0 - 3.6 K/uL   Monocytes Relative 5 %   Monocytes Absolute 0.8 0.2 - 0.9 K/uL   Eosinophils Relative 0 %   Eosinophils Absolute 0.0 0 - 0.7 K/uL   Basophils Relative 1 %   Basophils Absolute 0.1 0 - 0.1 K/uL    Comment: Performed at [facility], [address] [place], [place] [address]  Comprehensive metabolic panel     Status: Abnormal   Collection Time: 03/24/18  1:15 PM  Result Value Ref Range   Sodium 136 135 - 145 mmol/L    Comment: REPEATED LYTES KLW   Potassium 5.0 3.5 - 5.1 mmol/L    Comment: HEMOLYSIS AT THIS LEVEL MAY AFFECT RESULT   Chloride 94 (L) 98 - 111 mmol/L    Comment: Please note change in reference range.   CO2 9 (L) 22 - 32 mmol/L   Glucose, Bld 558 (HH) 70 - 99 mg/dL    Comment: CRITICAL RESULT CALLED TO, READ BACK BY AND VERIFIED WITH CHELSEA  [facility] 03/24/18 1415 KLW Please note change in reference range.    BUN 33 (H) 6 - 20 mg/dL    Comment: Please note change in reference range.   Creatinine, Ser 1.30 (H) 0.44 -  1.00 mg/dL   Calcium 9.3 8.9 - 10.3 mg/dL   Total Protein 8.1 6.5 - 8.1 g/dL   Albumin 4.6 3.5 - 5.0 g/dL   AST 22 15 - 41 U/L    Comment: HEMOLYSIS AT THIS LEVEL MAY AFFECT RESULT   ALT 18 0 - 44 U/L    Comment: Please note change in reference range.   Alkaline Phosphatase 117 38 - 126 U/L   Total Bilirubin 2.7 (H) 0.3 - 1.2 mg/dL    Comment: HEMOLYSIS AT THIS LEVEL MAY AFFECT RESULT   GFR calc non Af Amer 52 (L) >60 mL/min   GFR calc Af Amer >60 >60 mL/min    Comment: (NOTE) The eGFR has been calculated using the CKD EPI equation. This calculation has not been validated in all clinical situations. eGFR's persistently <60 mL/min signify possible Chronic Kidney Disease.    Anion gap 33 (H) 5 - 15    Comment: Performed at [facility], [address] [place], [place] [address]  Troponin I     Status: None   Collection Time: 03/24/18  1:15 PM  Result Value Ref Range   Troponin I <0.03 <0.03 ng/mL     Comment: Performed at [facility], [address] [place], [place] [address]  Blood gas, venous     Status: Abnormal   Collection Time: 03/24/18  1:15 PM  Result Value Ref Range   pH, Ven 7.03 (LL) 7.250 - 7.430    Comment: CRITICAL RESULT CALLED TO, READ BACK BY AND VERIFIED WITH: SAMANTHA RN [id number] JGT    pCO2, Ven 28 (L) 44.0 - 60.0 mmHg   pO2, Ven 49.0 (H) 32.0 - 45.0 mmHg   Patient temperature 37.0    Collection site VENOUS    Sample type VENOUS     Comment: Performed at [facility], [address] [place], [place] [address]  Glucose, capillary     Status: Abnormal   Collection Time: 03/24/18  1:23 PM  Result Value Ref Range   Glucose-Capillary 549 (HH) 70 - 99 mg/dL   Comment 1 Notify RN    Comment 2 Document in Chart     Dg Chest 2 View  Result Date: 03/24/2018 CLINICAL DATA:  Chest pain.  Hyperglycemia EXAM: CHEST - 2 VIEW COMPARISON:  March 22, 2017 FINDINGS: Lungs are clear. Heart size and pulmonary vascularity are normal. No adenopathy. No evident bone lesions. IMPRESSION: No edema or consolidation. Electronically Signed   By: Lowella Grip III M.D.   On: 03/24/2018 13:09    ROS Blood pressure 123/80, pulse (!) 123, temperature 97.6 F (36.4 C), temperature source Oral, resp. rate 16, height '5\' 4"'  (1.626 m), weight 125 lb (56.7 kg), SpO2 97 %. Physical Exam   Patient is sleepy but arousable. Vital signs: Please see the above listed vital signs HEENT: No oral lesions appreciated, trachea is midline, flat neck veins, no accessory muscle utilization Cardiovascular: Regular rate and rhythm Pulmonary: Clear to auscultation Abdominal: Positive bowel sounds, soft exam Extremity: No clubbing cyanosis or edema noted Neurologic: No cranial nerve deficits, moves all extremities, awake alert and oriented  cutaneous: No rashes or lesions noted  Assessment/Plan:  DKA. Hyperglycemia with acidosis. Fluid resuscitation, insulin drip, placed on  protocol with close follow-up of electrolytes  Acid-based derangement. Anion gap of 33, delta gap of 21, calculated starting bicarbonate 30 consistent with an initial metabolic alkalosis and subsequent anion gap metabolic acidosis with superimposed respiratory acidosis consistent with triple-based disorder  Leukocytosis. White count 14.pending UA  History of asthma. No evidence of exacerbation at this time, patient is not wheezing  History of anxiety.  Duvall Comes 03/24/2018, 2:52 PM

## 2018-03-24 NOTE — ED Triage Notes (Signed)
Pt ems from home with blood sugar 500's at home, body aches, back pain. Pt with hr 141 in triage

## 2018-03-24 NOTE — ED Provider Notes (Addendum)
[facility] Emergency Department Provider Note       Time seen: ----------------------------------------- 12:57 PM on 03/24/2018 -----------------------------------------   I have reviewed the triage vital signs and the nursing notes.  HISTORY   Chief Complaint Hyperglycemia    HPI Kristine Allison is a 37 y.o. female with a history of anxiety, asthma, diabetes, headache who presents to the ED for hyperglycemia.  Patient arrives from home with a blood sugar in the 500s.  She complains of body aches and back pain in her mid back.  She denies fevers or chills, she presents tachycardic with a history of being in DKA.  Patient states she is taking her insulin as prescribed.  Past Medical History:  Diagnosis Date  . Anxiety   . Asthma   . Diabetes mellitus without complication (HCC)   . MWUXLKGM(010.2)     Patient Active Problem List   Diagnosis Date Noted  . Dehydration   . Esophageal reflux   . Diabetic ketoacidosis without coma associated with type 1 diabetes mellitus (HCC)   . DKA (diabetic ketoacidoses) (HCC) 05/03/2015  . Yeast infection 02/09/2015  . Acute renal failure syndrome (HCC)   . Tachycardia 11/03/2014  . Insomnia 09/08/2014  . Dizziness 09/08/2014  . Migraine, unspecified 12/14/2013  . Elevated troponin I level 11/19/2013  . Acute myocardial infarction, subendocardial infarction, initial episode of care (HCC) 11/18/2013  . Noncompliance with medication regimen 11/15/2013  . Leg pain 09/08/2013  . Loss of weight 09/08/2013  . Depression 09/08/2013  . Hypotension, unspecified 09/08/2013  . Tobacco use disorder 08/01/2013  . Diabetes mellitus type 1, uncontrolled (HCC) 06/24/2012  . GERD (gastroesophageal reflux disease) 06/17/2012    Past Surgical History:  Procedure Laterality Date  . NO PAST SURGERIES      Allergies Latex  Social History Social History   Tobacco Use  . Smoking status: Current Every Day Smoker    Packs/day: 0.25    Years: 20.00    Pack years: 5.00    Types: Cigarettes  . Smokeless tobacco: Never Used  Substance Use Topics  . Alcohol use: No    Alcohol/week: 0.0 oz  . Drug use: No   Review of Systems Constitutional: Negative for fever. Cardiovascular: Negative for chest pain. Respiratory: Negative for shortness of breath. Gastrointestinal: Negative for abdominal pain, positive for nausea and vomiting Musculoskeletal: Positive for back pain Skin: Negative for rash. Neurological: Positive for weakness  All systems negative/normal/unremarkable except as stated in the HPI  ____________________________________________   PHYSICAL EXAM:  VITAL SIGNS: ED Triage Vitals  Enc Vitals Group     BP 03/24/18 1255 114/85     Pulse Rate 03/24/18 1255 (!) 141     Resp --      Temp 03/24/18 1255 97.6 F (36.4 C)     Temp Source 03/24/18 1255 Oral     SpO2 03/24/18 1255 100 %     Weight 03/24/18 1256 125 lb (56.7 kg)     Height 03/24/18 1256 5\' 4"  (1.626 m)     Head Circumference --      Peak Flow --      Pain Score 03/24/18 1252 10     Pain Loc --      Pain Edu? --      Excl. in GC? --    Constitutional: Alert and oriented.  Mild to moderate distress Eyes: Conjunctivae are normal. Normal extraocular movements. ENT   Head: Normocephalic and atraumatic.   Nose: No congestion/rhinnorhea.  Mouth/Throat: Mucous membranes are moist.   Neck: No stridor. Cardiovascular: Rapid rate, regular rhythm. No murmurs, rubs, or gallops. Respiratory: Normal respiratory effort without tachypnea nor retractions. Breath sounds are clear and equal bilaterally. No wheezes/rales/rhonchi. Gastrointestinal: Soft and nontender. Normal bowel sounds Musculoskeletal: Nontender with normal range of motion in extremities. No lower extremity tenderness nor edema. Neurologic:  Normal speech and language. No gross focal neurologic deficits are appreciated.  Skin:  Skin is warm, dry and intact.  No rash noted. Psychiatric: Mood and affect are normal. Speech and behavior are normal.  ____________________________________________  EKG: Interpreted by me.  Sinus tachycardia with a rate of 134 bpm, normal PR interval, normal QRS, long QT  ____________________________________________  ED COURSE:  As part of my medical decision making, I reviewed the following data within the electronic MEDICAL RECORD NUMBER History obtained from family if available, nursing notes, old chart and ekg, as well as notes from prior ED visits. Patient presented for hyperglycemia and weakness likely in DKA, we will assess with labs and imaging as indicated at this time.   Procedures ____________________________________________   LABS (pertinent positives/negatives)  Labs Reviewed  CBC WITH DIFFERENTIAL/PLATELET - Abnormal; Notable for the following components:      Result Value   WBC 14.0 (*)    Hemoglobin 16.3 (*)    HCT 49.1 (*)    Neutro Abs 12.5 (*)    Lymphs Abs 0.7 (*)    All other components within normal limits  COMPREHENSIVE METABOLIC PANEL - Abnormal; Notable for the following components:   Chloride 94 (*)    CO2 9 (*)    Glucose, Bld 558 (*)    BUN 33 (*)    Creatinine, Ser 1.30 (*)    Total Bilirubin 2.7 (*)    GFR calc non Af Amer 52 (*)    Anion gap 33 (*)    All other components within normal limits  BLOOD GAS, VENOUS - Abnormal; Notable for the following components:   pH, Ven 7.03 (*)    pCO2, Ven 28 (*)    pO2, Ven 49.0 (*)    All other components within normal limits  GLUCOSE, CAPILLARY - Abnormal; Notable for the following components:   Glucose-Capillary 549 (*)    All other components within normal limits  TROPONIN I  URINALYSIS, COMPLETE (UACMP) WITH MICROSCOPIC  BETA-HYDROXYBUTYRIC ACID  CBG MONITORING, ED   CRITICAL CARE Performed by: Ulice Dash   Total critical care time: 30 minutes  Critical care time was exclusive of separately billable procedures and  treating other patients.  Critical care was necessary to treat or prevent imminent or life-threatening deterioration.  Critical care was time spent personally by me on the following activities: development of treatment plan with patient and/or surrogate as well as nursing, discussions with consultants, evaluation of patient's response to treatment, examination of patient, obtaining history from patient or surrogate, ordering and performing treatments and interventions, ordering and review of laboratory studies, ordering and review of radiographic studies, pulse oximetry and re-evaluation of patient's condition.  RADIOLOGY  Chest x-ray Is unremarkable ____________________________________________  DIFFERENTIAL DIAGNOSIS   DKA, hyperglycemic hyperosmolar, dehydration, electrolyte abnormality, occult infection  FINAL ASSESSMENT AND PLAN  Diabetic ketoacidosis   Plan: The patient had presented for hyperglycemia and myalgias. Patient's labs do indicate diabetic ketoacidosis for which she was started on IV fluid and insulin drip on arrival. Patient's imaging was unremarkable.  She does have history of DKA, it is unclear why she is DKA as  she says she has been compliant with her medications.  She will require likely ICU stepdown admission.   Ulice DashJohnathan E Maverik Foot, MD   Note: This note was generated in part or whole with voice recognition software. Voice recognition is usually quite accurate but there are transcription errors that can and very often do occur. I apologize for any typographical errors that were not detected and corrected.     Emily FilbertWilliams, Mariell Nester E, MD 03/24/18 1353    Emily FilbertWilliams, Valita Righter E, MD 03/24/18 1358    Emily FilbertWilliams, Murrel Freet E, MD 03/24/18 [id number]

## 2018-03-24 NOTE — H&P (Signed)
[facility] [place] at [facility]   PATIENT NAME: Kristine Allison    MR#:  [id number]  DATE OF BIRTH:  [birth year]  DATE OF ADMISSION:  03/24/2018  PRIMARY CARE PHYSICIAN: Patient, No Pcp Per   REQUESTING/REFERRING PHYSICIAN: Dr. Daryel NovemberJonathan Williams  CHIEF COMPLAINT:   Chief Complaint  Patient presents with  . Hyperglycemia    HISTORY OF PRESENT ILLNESS:  Kristine Allison  is a 37 y.o. female with a known history of diabetes type 1 came in to ER with hyperglycemia with blood sugar more than 500.  Patient told me that she has back pain, nausea that is going on since yesterday.  Patient takes insulin as prescribed.  Patient found to have severe DKA blood work with pH 7.03, bicarb of 9 with acute renal failure.  Meeting the patient to stepdown status for DKA requiring insulin drip and IV fluids.  PAST MEDICAL HISTORY:   Past Medical History:  Diagnosis Date  . Anxiety   . Asthma   . Diabetes mellitus without complication (HCC)   . Headache(784.0)     PAST SURGICAL HISTOIRY:   Past Surgical History:  Procedure Laterality Date  . NO PAST SURGERIES      SOCIAL HISTORY:   Social History   Tobacco Use  . Smoking status: Current Every Day Smoker    Packs/day: 0.25    Years: 20.00    Pack years: 5.00    Types: Cigarettes  . Smokeless tobacco: Never Used  Substance Use Topics  . Alcohol use: No    Alcohol/week: 0.0 oz    FAMILY HISTORY:   Family History  Problem Relation Age of Onset  . Heart disease Unknown        No family history    DRUG ALLERGIES:   Allergies  Allergen Reactions  . Latex Rash    REVIEW OF SYSTEMS:  CONSTITUTIONAL: No fever, complains of fatigue, generalized weakness.   EYES: No blurred or double vision.  EARS, NOSE, AND THROAT: No tinnitus or ear pain.  RESPIRATORY: No cough, shortness of breath, wheezing or hemoptysis.  CARDIOVASCULAR: No chest pain, orthopnea, edema.  GASTROINTESTINAL: Feels  nauseous. GENITOURINARY: No dysuria, hematuria.  ENDOCRINE: Denies polyuria, nocturia.  Complains of increased thirst.. HEMATOLOGY: No anemia, easy bruising or bleeding SKIN: No rash or lesion. MUSCULOSKELETAL: Complains of low back pain  nEUROLOGIC: No tingling, numbness, weakness.  PSYCHIATRY: No anxiety or depression.   MEDICATIONS AT HOME:   Prior to Admission medications   Medication Sig Start Date End Date Taking? Authorizing Provider  aspirin EC 81 MG EC tablet Take 1 tablet (81 mg total) by mouth daily. Patient not taking: Reported on 03/24/2018 11/20/13   Nani RavensWight, Andrew M, MD  insulin NPH-regular Human (NOVOLIN 70/30) (70-30) 100 UNIT/ML injection Inject 20 Units into the skin 2 (two) times daily with a meal. Patient not taking: Reported on 03/24/2018 07/27/15   Esperanza SheetsBuriev, Ulugbek N, MD  promethazine (PHENERGAN) 12.5 MG tablet Take 1 tablet (12.5 mg total) by mouth every 6 (six) hours as needed for nausea or vomiting. Patient not taking: Reported on 03/24/2018 01/31/18   Willy Eddyobinson, Patrick, MD      VITAL SIGNS:  Blood pressure 123/80, pulse (!) 123, temperature 97.6 F (36.4 C), temperature source Oral, resp. rate 16, height 5\' 4"  (1.626 m), weight 56.7 kg (125 lb), SpO2 97 %.  PHYSICAL EXAMINATION:  GENERAL:  37 y.o.-year-old patient lying in the bed w.  Patient appears clinically dry EYES: Pupils equal,  round, reactive to light and accommodation. No scleral icterus. Extraocular muscles intact.  HEENT: Head atraumatic, normocephalic. Oropharynx and nasopharynx clear.  NECK:  Supple, no jugular venous distention. No thyroid enlargement, no tenderness.  LUNGS: Normal breath sounds bilaterally, no wheezing, rales,rhonchi or crepitation. No use of accessory muscles of respiration.  CARDIOVASCULAR: S1, S2 tachycardic . No murmurs, rubs, or gallops.  ABDOMEN: Soft, nontender, nondistended. Bowel sounds present. No organomegaly or mass.  EXTREMITIES: No pedal edema, cyanosis, or clubbing.   NEUROLOGIC: Cranial nerves II through XII are intact. Muscle strength 5/5 in all extremities. Sensation intact. Gait not checked.  PSYCHIATRIC: The patient is alert and oriented x 3.  SKIN: No obvious rash, lesion, or ulcer.   LABORATORY PANEL:   CBC Recent Labs  Lab 03/24/18 1315  WBC 14.0*  HGB 16.3*  HCT 49.1*  PLT 326   ------------------------------------------------------------------------------------------------------------------  Chemistries  Recent Labs  Lab 03/24/18 1315  NA 136  K 5.0  CL 94*  CO2 9*  GLUCOSE 558*  BUN 33*  CREATININE 1.30*  CALCIUM 9.3  AST 22  ALT 18  ALKPHOS 117  BILITOT 2.7*   ------------------------------------------------------------------------------------------------------------------  Cardiac Enzymes Recent Labs  Lab 03/24/18 1315  TROPONINI <0.03   ------------------------------------------------------------------------------------------------------------------  RADIOLOGY:  Dg Chest 2 View  Result Date: 03/24/2018 CLINICAL DATA:  Chest pain.  Hyperglycemia EXAM: CHEST - 2 VIEW COMPARISON:  March 22, 2017 FINDINGS: Lungs are clear. Heart size and pulmonary vascularity are normal. No adenopathy. No evident bone lesions. IMPRESSION: No edema or consolidation. Electronically Signed   By: Bretta Bang III M.D.   On: 03/24/2018 13:09    EKG:   Orders placed or performed during the hospital encounter of 03/24/18  . ED EKG  . ED EKG  . EKG 12-Lead  . EKG 12-Lead    IMPRESSION AND PLAN:   37 year old female patient with diabetes type 1 on insulin comes in with nausea, vomiting, backache and found to have hypoglycemia, DKA.  Admit to intensive care unit stepdown status for IV insulin drip, IV fluids. 1.  DKA type 1 diabetes mellitus patient: Unclear reason why it got precipitated.  Continue with hydration, insulin drip.  Patient already receiving second liter of normal saline bolus, continue IV fluids, IV insulin drip. 2.   Acute renal failure, mild hyperkalemia secondary to DKA: Continue insulin drip, aggressive hydration. 3.  Nausea, generalized body aches likely secondary to DKA, continue IV Zofran, IV PPIs. 4.  History of asthma but no wheezing. 5.  Leukocytosis likely reactive.  Check UA for possible UTI.     All the records are reviewed and case discussed with ED provider. Management plans discussed with the patient, family and they are in agreement.  CODE STATUS: Full code TOTAL TIME TAKING CARE OF THIS PATIENT: .Critical care Katha Hamming M.D on 03/24/2018 at 2:42 PM  Between 7am to 6pm - Pager - [phone]  After 6pm go to [email or web address] - password [email or web address]  [facility]  Office  [phone]  CC: Primary care physician; Patient, No Pcp Per  Note: This dictation was prepared with Dragon dictation along with smaller phrase technology. Any transcriptional errors that result from this process are unintentional.

## 2018-03-24 NOTE — ED Notes (Signed)
Patient transported to X-ray 

## 2018-03-25 LAB — URINALYSIS, COMPLETE (UACMP) WITH MICROSCOPIC
Bilirubin Urine: NEGATIVE
GLUCOSE, UA: 50 mg/dL — AB
Hgb urine dipstick: NEGATIVE
KETONES UR: 80 mg/dL — AB
NITRITE: NEGATIVE
PH: 6 (ref 5.0–8.0)
Protein, ur: NEGATIVE mg/dL
SPECIFIC GRAVITY, URINE: 1.018 (ref 1.005–1.030)

## 2018-03-25 LAB — GLUCOSE, CAPILLARY
GLUCOSE-CAPILLARY: 112 mg/dL — AB (ref 70–99)
GLUCOSE-CAPILLARY: 138 mg/dL — AB (ref 70–99)
GLUCOSE-CAPILLARY: 149 mg/dL — AB (ref 70–99)
GLUCOSE-CAPILLARY: 187 mg/dL — AB (ref 70–99)
GLUCOSE-CAPILLARY: 241 mg/dL — AB (ref 70–99)
Glucose-Capillary: 171 mg/dL — ABNORMAL HIGH (ref 70–99)
Glucose-Capillary: 145 mg/dL — ABNORMAL HIGH (ref 70–99)
Glucose-Capillary: 138 mg/dL — ABNORMAL HIGH (ref 70–99)

## 2018-03-25 LAB — BASIC METABOLIC PANEL
Anion gap: 6 (ref 5–15)
BUN: 21 mg/dL — AB (ref 6–20)
CO2: 22 mmol/L (ref 22–32)
Calcium: 8.2 mg/dL — ABNORMAL LOW (ref 8.9–10.3)
Chloride: 107 mmol/L (ref 98–111)
Creatinine, Ser: 0.77 mg/dL (ref 0.44–1.00)
GFR calc Af Amer: >60 mL/min (ref >60)
GLUCOSE: 200 mg/dL — AB (ref 70–99)
Potassium: 4.3 mmol/L (ref 3.5–5.1)
SODIUM: 135 mmol/L (ref 135–145)

## 2018-03-25 MED ORDER — SODIUM CHLORIDE 0.9% FLUSH
3.0000 mL | Freq: Two times a day (BID) | INTRAVENOUS | Status: DC
  Administered 2018-03-25 – 2018-03-26 (×3): 3 mL via INTRAVENOUS

## 2018-03-25 MED ORDER — SODIUM CHLORIDE 0.9% FLUSH
3.0000 mL | INTRAVENOUS | Status: DC | PRN
  Administered 2018-03-25: 09:00:00 3 mL via INTRAVENOUS
  Filled 2018-03-25: qty 3

## 2018-03-25 MED ORDER — LIVING WELL WITH DIABETES BOOK
Freq: Once | Status: AC
  Administered 2018-03-25: 17:00:00
  Filled 2018-03-25 (×2): qty 1

## 2018-03-25 MED ORDER — SODIUM CHLORIDE 0.9 % IV SOLN
1.0000 g | INTRAVENOUS | Status: DC
  Administered 2018-03-26: 04:00:00 1 g via INTRAVENOUS
  Filled 2018-03-25 (×2): qty 10

## 2018-03-25 MED ORDER — INSULIN ASPART 100 UNIT/ML ~~LOC~~ SOLN
0.0000 [IU] | Freq: Every day | SUBCUTANEOUS | Status: DC
  Administered 2018-03-25: 21:00:00 2 [IU] via SUBCUTANEOUS
  Filled 2018-03-25: qty 1

## 2018-03-25 MED ORDER — INSULIN GLARGINE 100 UNIT/ML ~~LOC~~ SOLN
10.0000 [IU] | SUBCUTANEOUS | Status: DC
  Administered 2018-03-25 (×2): 10 [IU] via SUBCUTANEOUS
  Filled 2018-03-25 (×3): qty 0.1

## 2018-03-25 MED ORDER — INSULIN ASPART 100 UNIT/ML ~~LOC~~ SOLN
0.0000 [IU] | Freq: Three times a day (TID) | SUBCUTANEOUS | Status: DC
  Administered 2018-03-25 (×2): 1 [IU] via SUBCUTANEOUS
  Administered 2018-03-26: 08:00:00 5 [IU] via SUBCUTANEOUS
  Filled 2018-03-25 (×3): qty 1

## 2018-03-25 NOTE — Progress Notes (Signed)
Inpatient Diabetes Program Recommendations  AACE/ADA: New Consensus Statement on Inpatient Glycemic Control (2015)  Target Ranges:  Prepandial:   less than 140 mg/dL      Peak postprandial:   less than 180 mg/dL (1-2 hours)      Critically ill patients:  140 - 180 mg/dL   Lab Results  Component Value Date   GLUCAP 112 (H) 03/25/2018   HGBA1C 12.6 (H) 03/24/2018    Review of Glycemic Control  Per MD progress note, DKA d/t "running out of meds." Case manager involved.  Inpatient Diabetes Program Recommendations:     Add Novolog 3 units tidwc for meal coverage insulin if pt eats > 50% meal. Will likely need to increase Levemir to 10 units bid.  Will continue to follow.  Thank you. Ailene Ardshonda Matteus Mcnelly, RD, LDN, CDE Inpatient Diabetes Coordinator [phone]

## 2018-03-25 NOTE — Progress Notes (Signed)
Pt had nausea with 150 ml green liquid emesis this a.m with zofran IV effective. Has sipped on diet cola and diet ginger ale, ice chips. Refused meal trays. Has slept at long intervals; keeps eyes closed with limited interactions. Arouses easily. Urine cloudy/yellow/turbid with void x 1 thus far this shift.. Encouraged po's. Pt demonstrates limited interest in education at this time; will attempt later when pt more receptive. Denies pain. No observed visitors; not interested in phone calls with pt not answering phone.

## 2018-03-25 NOTE — Progress Notes (Signed)
[facility] - [place] at [facility]   PATIENT NAME: Kristine Allison    MR#:  [id number]  DATE OF BIRTH:  [birth year]  SUBJECTIVE: Patient admitted for DKA was on insulin drip and IV fluids, admitted to stepdown, patient anion gap closed and transferred out of ICU.  She still has nausea, poor p.o. intake.  CHIEF COMPLAINT:   Chief Complaint  Patient presents with  . Hyperglycemia    REVIEW OF SYSTEMS:    Review of Systems  Constitutional: Negative for chills and fever.  HENT: Negative for hearing loss.   Eyes: Negative for blurred vision, double vision and photophobia.  Respiratory: Negative for cough, hemoptysis and shortness of breath.   Cardiovascular: Negative for palpitations, orthopnea and leg swelling.  Gastrointestinal: Positive for nausea. Negative for abdominal pain, diarrhea and vomiting.  Genitourinary: Negative for dysuria and urgency.  Musculoskeletal: Positive for back pain and myalgias. Negative for neck pain.  Skin: Negative for rash.  Neurological: Negative for dizziness, focal weakness, seizures, weakness and headaches.  Psychiatric/Behavioral: Negative for memory loss. The patient does not have insomnia.     Nutrition: Tolerating the diet. Tolerating Diet: Tolerating PT:      DRUG ALLERGIES:   Allergies  Allergen Reactions  . Latex Rash    VITALS:  Blood pressure 108/73, pulse 88, temperature 98 F (36.7 C), temperature source Oral, resp. rate 20, height 5\' 4"  (1.626 m), weight 49.5 kg (109 lb 2 oz), SpO2 100 %.  PHYSICAL EXAMINATION:   Physical Exam  GENERAL:  37 y.o.-year-old patient lying in the bed with no acute distress.  EYES: Pupils equal, round, reactive to light and accommodation. No scleral icterus. Extraocular muscles intact.  HEENT: Head atraumatic, normocephalic. Oropharynx and nasopharynx clear.  NECK:  Supple, no jugular venous distention. No thyroid enlargement, no tenderness.  LUNGS: Normal breath  sounds bilaterally, no wheezing, rales,rhonchi or crepitation. No use of accessory muscles of respiration.  CARDIOVASCULAR: S1, S2 normal. No murmurs, rubs, or gallops.  ABDOMEN: Soft, nontender, nondistended. Bowel sounds present. No organomegaly or mass.  EXTREMITIES: No pedal edema, cyanosis, or clubbing.  NEUROLOGIC: Cranial nerves II through XII are intact. Muscle strength 5/5 in all extremities. Sensation intact. Gait not checked.  PSYCHIATRIC: The patient is alert and oriented x 3.  SKIN: No obvious rash, lesion, or ulcer.    LABORATORY PANEL:   CBC Recent Labs  Lab 03/24/18 1315  WBC 14.0*  HGB 16.3*  HCT 49.1*  PLT 326   ------------------------------------------------------------------------------------------------------------------  Chemistries  Recent Labs  Lab 03/24/18 1315  03/24/18 2334  NA 136   < > 135  K 5.0   < > 4.3  CL 94*   < > 107  CO2 9*   < > 22  GLUCOSE 558*   < > 200*  BUN 33*   < > 21*  CREATININE 1.30*   < > 0.77  CALCIUM 9.3   < > 8.2*  AST 22  --   --   ALT 18  --   --   ALKPHOS 117  --   --   BILITOT 2.7*  --   --    < > = values in this interval not displayed.   ------------------------------------------------------------------------------------------------------------------  Cardiac Enzymes Recent Labs  Lab 03/24/18 1315  TROPONINI <0.03   ------------------------------------------------------------------------------------------------------------------  RADIOLOGY:  Dg Chest 2 View  Result Date: 03/24/2018 CLINICAL DATA:  Chest pain.  Hyperglycemia EXAM: CHEST - 2 VIEW COMPARISON:  March 22, 2017 FINDINGS:  Lungs are clear. Heart size and pulmonary vascularity are normal. No adenopathy. No evident bone lesions. IMPRESSION: No edema or consolidation. Electronically Signed   By: Bretta BangWilliam  Woodruff III M.D.   On: 03/24/2018 13:09     ASSESSMENT AND PLAN:   Active Problems:   DKA, type 1 (HCC)   #1 DKA type 1 diabetes mellitus:  Status post insulin drip, IV fluids, transferred out of ICU, patient transitioned to Lantus, subcu insulin.  Patient still has nausea so continue IV fluids, IV nausea medicines, IV PPIs, seen by diabetes coordinator recommended to add NovoLog 3 units 3 times daily with meals, continue Levemir 10 units but diabetes coordinator recommended 10 units twice daily.  Patient Seen by case manager for [facility] for medication management.  #2 .possible UTI with elevated white count: Patient UA is ordered but not done yet.  Please do the UA as soon as possible so we can start antibiotic if there is infection.   All the records are reviewed and case discussed with Care Management/Social Workerr. Management plans discussed with the patient, family and they are in agreement.  CODE STATUS: Full code TOTAL TIME TAKING CARE OF THIS PATIENT: 35 minutes.   POSSIBLE D/C IN 1-2 DAYS, DEPENDING ON CLINICAL CONDITION.   Katha HammingSnehalatha Cloie Wooden M.D on 03/25/2018 at 11:27 AM  Between 7am to 6pm - Pager - [phone]  After 6pm go to [email or web address] - password [email or web address]  [facility]  Office  [phone]  CC: Primary care physician; Patient, No Pcp Per

## 2018-03-26 DIAGNOSIS — K148 Other diseases of tongue: Secondary | ICD-10-CM | POA: Diagnosis present

## 2018-03-26 LAB — BASIC METABOLIC PANEL
Anion gap: 14 (ref 5–15)
BUN: 19 mg/dL (ref 6–20)
CALCIUM: 8.2 mg/dL — AB (ref 8.9–10.3)
CO2: 20 mmol/L — ABNORMAL LOW (ref 22–32)
Chloride: 98 mmol/L (ref 98–111)
Creatinine, Ser: 0.86 mg/dL (ref 0.44–1.00)
GFR calc non Af Amer: >60 mL/min (ref >60)
Glucose, Bld: 546 mg/dL (ref 70–99)
Potassium: 3.6 mmol/L (ref 3.5–5.1)
Sodium: 132 mmol/L — ABNORMAL LOW (ref 135–145)

## 2018-03-26 LAB — GLUCOSE, CAPILLARY
GLUCOSE-CAPILLARY: 180 mg/dL — AB (ref 70–99)
Glucose-Capillary: 453 mg/dL — ABNORMAL HIGH (ref 70–99)
Glucose-Capillary: 78 mg/dL (ref 70–99)

## 2018-03-26 MED ORDER — INSULIN ASPART 100 UNIT/ML ~~LOC~~ SOLN
5.0000 [IU] | Freq: Once | SUBCUTANEOUS | Status: AC
  Administered 2018-03-26: 5 [IU] via SUBCUTANEOUS
  Filled 2018-03-26: qty 1

## 2018-03-26 MED ORDER — CIPROFLOXACIN HCL 500 MG PO TABS: 500.0000 mg | ORAL_TABLET | Freq: Two times a day (BID) | ORAL | 0 refills | Status: AC

## 2018-03-26 MED ORDER — INSULIN NPH ISOPHANE & REGULAR (70-30) 100 UNIT/ML ~~LOC~~ SUSP: SUBCUTANEOUS | 3 refills | Status: DC

## 2018-03-26 MED ORDER — INSULIN ASPART 100 UNIT/ML ~~LOC~~ SOLN
10.0000 [IU] | Freq: Once | SUBCUTANEOUS | Status: AC
  Administered 2018-03-26: 10 [IU] via SUBCUTANEOUS
  Filled 2018-03-26: qty 1

## 2018-03-26 MED ORDER — ACETAMINOPHEN 325 MG PO TABS
650.0000 mg | ORAL_TABLET | Freq: Four times a day (QID) | ORAL | Status: DC | PRN
  Filled 2018-03-26: qty 2

## 2018-03-26 NOTE — Progress Notes (Addendum)
Inpatient Diabetes Program Recommendations  AACE/ADA: New Consensus Statement on Inpatient Glycemic Control (2015)  Target Ranges:  Prepandial:   less than 140 mg/dL      Peak postprandial:   less than 180 mg/dL (1-2 hours)      Critically ill patients:  140 - 180 mg/dL   Lab Results  Component Value Date   GLUCAP 78 03/26/2018   HGBA1C 12.6 (H) 03/24/2018    Review of Glycemic ControlResults for Kristine Allison, Kristine Allison (MRN [id number]) as of 03/26/2018 12:33  Ref. Range 03/25/2018 16:28 03/25/2018 19:58 03/26/2018 07:56 03/26/2018 09:27 03/26/2018 11:57  Glucose-Capillary Latest Ref Range: 70 - 99 mg/dL 409138 (H) 811241 (H) 914453 (H) 180 (H) 78    Diabetes history: Type 1 DM  Outpatient Diabetes medications: Novolin 70/30 20 units bid- patient buys without rx from [facility] Current orders for Inpatient glycemic control:  Novolog sensitive tid with meals and HS, Lantus 10 units q 24 hours  Inpatient Diabetes Program Recommendations:    Spoke with patient regarding home medications.  She buys insulin from [employer] [facility] and keeps in her book bag.  She states that she was told that she did not have to refrigerate it.  I explained that she does not have to refrigerate, however it should always be at room temperature without extreme temperatures.  Patient states that sometimes when she takes her insulin, "it just doesn't wok".  After we discussed storage she states "well that explains why my insulin doesn't work".  I instructed her to dispose of insulin that has been left in the heat and to start with new bottle.  We also discussed that Novolin 70/30 can be dispensed in insulin pens now at close to the same price as the vial.  Patient was interested stating that her Mom uses an insulin pen. I briefly showed her how to place insulin pen needle, prime with 2 units, and administer insulin with pen.  She states that this may be easier.  She is monitoring "sometimes".  We discussed the use of correction insulin as well.  Patient  states that she has a scale that was ordered by doctor but she rarely uses it.  I reminded her that the short acting insulin Regular can also be [facility] for increased blood sugars along with 70/30. Patient verbalized understanding.  She states that she usually eats just once a day? She works at [employer].  They are no longer allowed to bring in bags.  I told her to get small cooler to bring into work for insulin to prevent overheating of insulin.   I discussed the importance of follow-up with PCP.  She has information [facility].  Discussed with MD as well.  She plans to d/c patient on the same amount of 70/30 as she was taking previously. MD also included option of insulin pen to potentially improve compliance of taking insulin.  Patient very eager to get out of hospital.  Thanks,  Beryl MeagerJenny Presley Summerlin, RN, BC-ADM Inpatient Diabetes Coordinator Pager [phone] (8a-5p)

## 2018-03-26 NOTE — Discharge Summary (Signed)
Kristine Allison, is a 37 y.o. female  DOB [birth year]  MRN [id number].  Admission date:  03/24/2018  Admitting Physician  Katha Hamming, MD  Discharge Date:  03/26/2018   Primary MD  Patient, No Pcp Per  Recommendations for primary care physician for things to follow:   Patient has no PCP, case manager gave [facility] application.  Patient usually feels insulin at [facility].  Strongly encourage the patient to follow-up at [facility] and medication management.   Admission Diagnosis  Diabetic ketoacidosis without coma associated with type 1 diabetes mellitus (HCC) [E10.10]   Discharge Diagnosis  Diabetic ketoacidosis without coma associated with type 1 diabetes mellitus (HCC) [E10.10]    Active Problems:   DKA (diabetic ketoacidoses) (HCC)   DKA, type 1 (HCC)   Edema of the tongue      Past Medical History:  Diagnosis Date  . Anxiety   . Asthma   . Diabetes mellitus without complication (HCC)   . [id number](478.2)     Past Surgical History:  Procedure Laterality Date  . NO PAST SURGERIES         History of present illness and  Hospital Course:     Kindly see H&P for history of present illness and admission details, please review complete Labs, Consult reports and Test reports for all details in brief  HPI  from the history and physical done on the day of admission 37 year old female patient with type 1 diabetes mellitus comes in because of generalized weakness with nausea, vomiting, back pain and found to have DKA.   Hospital Course   #1 diabetic ketoacidosis in a patient with type 1 diabetes mellitus: Patient found to have blood sugar more than 500, pH 7.03, patient found to have acute renal failure with metabolic acidosis and DKA, admitted to stepdown unit started on aggressive  hydration, insulin drip.  Patient anion gap was 33 when she came.  With fluids, insulin drip anion gap closed to 6.  Hemoglobin A1c is 12.6 indicating poorly controlled diabetes.  Diabetes coordinator, patient found to leave insulin in the car when she goes to work.  Explained that patient insulin needs to be restored least at room temperature if not in refrigerator.  With told her to discard all insulin that she has and we provided new prescription for page 70/ 30  20 units twice daily.  Patient not taking insulin as prescribed so told her she needs to take and follow-up at [facility].  Insulin doses have not been adjusted discussed with diabetic coordinator Jeannie. 2.  UTI: Urine cultures not available yet.  Patient UA showed bacteria, started on Rocephin, discharged home with Cipro.  500 twice daily for 5 days.   Discharge Condition: Condition stable   Follow UP      Discharge Instructions  and  Discharge Medications   Carb controlled diet, advised the patient to store insulin in refrigerator if not at least at room temperature and told her not to insulin her car.   Allergies as of 03/26/2018      Reactions   Latex Rash      Medication List    TAKE these medications   aspirin 81 MG EC tablet Take 1 tablet (81 mg total) by mouth daily.   ciprofloxacin 500 MG tablet Commonly known as:  CIPRO Take 1 tablet (500 mg total) by mouth 2 (two) times daily for 5 days.   insulin NPH-regular Human (70-30) 100 UNIT/ML injection Commonly known as:  NOVOLIN 70/30 Inject 20 Units into the skin 2 (two) times daily with a meal.   promethazine 12.5 MG tablet Commonly known as:  PHENERGAN Take 1 tablet (12.5 mg total) by mouth every 6 (six) hours as needed for nausea or vomiting.         Diet and Activity recommendation: See Discharge Instructions above   Consults obtained -intensivist, diabetes coordinator   Major procedures and Radiology Reports - PLEASE review detailed and  final reports for all details, in brief -      Dg Chest 2 View  Result Date: 03/24/2018 CLINICAL DATA:  Chest pain.  Hyperglycemia EXAM: CHEST - 2 VIEW COMPARISON:  March 22, 2017 FINDINGS: Lungs are clear. Heart size and pulmonary vascularity are normal. No adenopathy. No evident bone lesions. IMPRESSION: No edema or consolidation. Electronically Signed   By: Bretta BangWilliam  Woodruff III M.D.   On: 03/24/2018 13:09    Micro Results     Recent Results (from the past 240 hour(s))  MRSA PCR Screening     Status: None   Collection Time: 03/24/18  3:51 PM  Result Value Ref Range Status   MRSA by PCR NEGATIVE NEGATIVE Final    Comment:        The GeneXpert MRSA Assay (FDA approved for NASAL specimens only), is one component of a comprehensive MRSA colonization surveillance program. It is not intended to diagnose MRSA infection nor to guide or monitor treatment for MRSA infections. Performed at [facility], [address] [place], [place] [address]        Today   Subjective:   Kristine Allison today has no headache,no chest abdominal pain,no new weakness tingling or numbness, feels much better wants to go home today.   Objective:   Blood pressure (!) 102/45, pulse 80, temperature 98.6 F (37 C), temperature source Oral, resp. rate 18, height 5\' 4"  (1.626 m), weight 49.5 kg (109 lb 2 oz), SpO2 98 %.   Intake/Output Summary (Last 24 hours) at 03/26/2018 1029 Last data filed at 03/26/2018 0828 Gross per 24 hour  Intake 864 ml  Output 300 ml  Net 564 ml    Exam Awake Alert, Oriented x 3, No new F.N deficits, Normal affect Rogers.AT,PERRAL Supple Neck,No JVD, No cervical lymphadenopathy appriciated.  Symmetrical Chest wall movement, Good air movement bilaterally, CTAB RRR,No Gallops,Rubs or new Murmurs, No Parasternal Heave +ve B.Sounds, Abd Soft, Non tender, No organomegaly appriciated, No rebound -guarding or rigidity. No Cyanosis, Clubbing or edema, No new Rash or  bruise  Data Review   CBC w Diff:  Lab Results  Component Value Date   WBC 14.0 (H) 03/24/2018   HGB 16.3 (H) 03/24/2018   HCT 49.1 (H) 03/24/2018   PLT 326 03/24/2018   LYMPHOPCT 5 03/24/2018   MONOPCT 5 03/24/2018   EOSPCT 0 03/24/2018   BASOPCT 1 03/24/2018    CMP:  Lab Results  Component Value Date   NA 132 (L) 03/26/2018   K 3.6 03/26/2018   CL 98 03/26/2018   CO2 20 (L) 03/26/2018   BUN 19 03/26/2018   CREATININE 0.86 03/26/2018   CREATININE 0.68 09/08/2014   PROT 8.1 03/24/2018   ALBUMIN 4.6 03/24/2018   BILITOT 2.7 (H) 03/24/2018   ALKPHOS 117 03/24/2018   AST 22 03/24/2018   ALT 18 03/24/2018  .   Total Time in preparing paper work, data evaluation and todays exam - 35 minutes  Katha HammingSnehalatha Tarnisha Kachmar M.D on 03/26/2018 at 10:29 AM  Note: This dictation was prepared with Dragon dictation along with smaller phrase technology. Any transcriptional errors that result from this process are unintentional.

## 2018-03-26 NOTE — Discharge Instructions (Signed)
Blood Glucose Monitoring, Adult °Monitoring your blood sugar (glucose) helps you manage your diabetes. It also helps you and your health care provider determine how well your diabetes management plan is working. Blood glucose monitoring involves checking your blood glucose as often as directed, and keeping a record (log) of your results over time. °Why should I monitor my blood glucose? °Checking your blood glucose regularly can: °· Help you understand how food, exercise, illnesses, and medicines affect your blood glucose. °· Let you know what your blood glucose is at any time. You can quickly tell if you are having low blood glucose (hypoglycemia) or high blood glucose (hyperglycemia). °· Help you and your health care provider adjust your medicines as needed. ° °When should I check my blood glucose? °Follow instructions from your health care provider about how often to check your blood glucose. This may depend on: °· The type of diabetes you have. °· How well-controlled your diabetes is. °· Medicines you are taking. ° °If you have type 1 diabetes: °· Check your blood glucose at least 2 times a day. °· Also check your blood glucose: °? Before every insulin injection. °? Before and after exercise. °? Between meals. °? 2 hours after a meal. °? Occasionally between 2:00 a.m. and 3:00 a.m., as directed. °? Before potentially dangerous tasks, like driving or using heavy machinery. °? At bedtime. °· You may need to check your blood glucose more often, up to 6-10 times a day: °? If you use an insulin pump. °? If you need multiple daily injections (MDI). °? If your diabetes is not well-controlled. °? If you are ill. °? If you have a history of severe hypoglycemia. °? If you have a history of not knowing when your blood glucose is getting low (hypoglycemia unawareness). °If you have type 2 diabetes: °· If you take insulin or other diabetes medicines, check your blood glucose at least 2 times a day. °· If you are on intensive  insulin therapy, check your blood glucose at least 4 times a day. Occasionally, you may also need to check between 2:00 a.m. and 3:00 a.m., as directed. °· Also check your blood glucose: °? Before and after exercise. °? Before potentially dangerous tasks, like driving or using heavy machinery. °· You may need to check your blood glucose more often if: °? Your medicine is being adjusted. °? Your diabetes is not well-controlled. °? You are ill. °What is a blood glucose log? °· A blood glucose log is a record of your blood glucose readings. It helps you and your health care provider: °? Look for patterns in your blood glucose over time. °? Adjust your diabetes management plan as needed. °· Every time you check your blood glucose, write down your result and notes about things that may be affecting your blood glucose, such as your diet and exercise for the day. °· Most glucose meters store a record of glucose readings in the meter. Some meters allow you to download your records to a computer. °How do I check my blood glucose? °Follow these steps to get accurate readings of your blood glucose: °Supplies needed ° °· Blood glucose meter. °· Test strips for your meter. Each meter has its own strips. You must use the strips that come with your meter. °· A needle to prick your finger (lancet). Do not use lancets more than once. °· A device that holds the lancet (lancing device). °· A journal or log book to write down your results. °Procedure °·   Wash your hands with soap and water. °· Prick the side of your finger (not the tip) with the lancet. Use a different finger each time. °· Gently rub the finger until a small drop of blood appears. °· Follow instructions that come with your meter for inserting the test strip, applying blood to the strip, and using your blood glucose meter. °· Write down your result and any notes. °Alternative testing sites °· Some meters allow you to use areas of your body other than your finger  (alternative sites) to test your blood. °· If you think you may have hypoglycemia, or if you have hypoglycemia unawareness, do not use alternative sites. Use your finger instead. °· Alternative sites may not be as accurate as the fingers, because blood flow is slower in these areas. This means that the result you get may be delayed, and it may be different from the result that you would get from your finger. °· The most common alternative sites are: °? Forearm. °? Thigh. °? Palm of the hand. °Additional tips °· Always keep your supplies with you. °· If you have questions or need help, all blood glucose meters have a 24-hour “hotline” number that you can call. You may also contact your health care provider. °· After you use a few boxes of test strips, adjust (calibrate) your blood glucose meter by following instructions that came with your meter. °This information is not intended to replace advice given to you by your health care provider. Make sure you discuss any questions you have with your health care provider. °Document Released: 09/11/2003 Document Revised: 03/28/2016 Document Reviewed: 02/18/2016 °Elsevier Interactive Patient Education © 2017 [employer] ° ° °Coping With Diabetes °Diabetes (type 1 diabetes mellitus or type 2 diabetes mellitus) is a condition in which the body does not have enough of a hormone called insulin, or the body does not respond properly to insulin. Normally, insulin allows sugars (glucose) to enter cells in the body. The cells use glucose for energy. With diabetes, extra glucose builds up in the blood instead of going into cells, which results in high blood glucose (hyperglycemia). °How to manage lifestyle changes °Managing diabetes includes medical treatments as well as lifestyle changes. If diabetes is not managed well, serious physical and emotional complications can occur. Taking good care of yourself means that you are responsible for: °· Monitoring glucose regularly. °· Eating  a healthy diet. °· Exercising regularly. °· Meeting with health care providers. °· Taking medicines as directed. ° °Some people may feel a lot of stress about managing their diabetes. This is known as emotional distress, and it is very common. Living with diabetes can place you at risk for emotional distress, depression, or anxiety. These disorders can be confusing and can make diabetes management more difficult. °How to recognize stress °Emotional distress °Symptoms of emotional distress include: °· Anger about having a diagnosis of diabetes. °· Fear or frustration about your diagnosis and the changes you need to make to manage the condition. °· Being overly worried about the care that you need or the cost of the care you need. °· Feeling like you caused your condition by doing something wrong. °· Fear of unpredictable situations, like low or high blood glucose. °· Feeling judged by your health care providers. °· Feeling very alone with the disease. °· Getting too tired or "burned out" with the demands of daily care. ° °Depression °Having diabetes means that you are at a higher risk for depression. Having depression also means   that you are at a higher risk for diabetes. Your health care provider may test (screen) you for symptoms of depression. It is important to recognize depression symptoms and to start treatment for it soon after it is diagnosed. The following are some symptoms of depression: °· Loss of interest in things that you used to enjoy. °· Trouble sleeping, or often waking up early and not being able to get back to sleep. °· A change in appetite. °· Feeling tired most of the day. °· Feeling nervous and anxious. °· Feeling guilty and worrying that you are a burden to others. °· Feeling depressed more often than you do not feel that way. °· Thoughts of hurting yourself or feeling that you want to die. ° °If you have any of these symptoms for 2 weeks or longer, reach out to a health care provider. °Where  to find support °· Ask your health care provider to recommend a therapist who understands both depression and diabetes. °· Search for information and support from the American Diabetes Association: www.diabetes.org °· Find a certified diabetes educator and make an appointment through [employer] Association of Diabetes Educators: www.diabeteseducator.org °Follow these instructions at home: °Managing emotional distress °The following are some ways to manage emotional distress: °· Talk with your health care provider or certified diabetes educator. Consider working with a counselor or therapist. °· Learn as much as you can about diabetes and its treatment. Meet with a certified diabetes educator or take a class to learn how to manage your condition. °· Keep a journal of your thoughts and concerns. °· Accept that some things are out of your control. °· Talk with other people who have diabetes. It can help to talk with others about the emotional distress that you feel. °· Find ways to manage stress that work for you. These may include art or music therapy, exercise, meditation, and hobbies. °· Seek support from spiritual leaders, family, and friends. ° °General instructions °· Follow your diabetes management plan. °· Keep all follow-up visits as told by your health care provider. This is important. °Get help right away if: °· You have thoughts about hurting yourself or others. °If you ever feel like you may hurt yourself or others, or have thoughts about taking your own life, get help right away. You can go to your nearest emergency department or call: °· Your local emergency services (911 in the [place] °· A suicide crisis helpline, such as the National Suicide Prevention Lifeline at 1-800-273-8255. This is open 24 hours a day. ° °Summary °· Diabetes (type 1 diabetes mellitus or type 2 diabetes mellitus) is a condition in which the body does not have enough of a hormone called insulin, or the body does not respond properly  to insulin. °· Living with diabetes puts you at risk for medical issues, and it also puts you at risk for emotional issues such as emotional distress, depression, and anxiety. °· Recognizing the symptoms of emotional distress and depression may help you avoid problems with your diabetes control. It is important to start treatment for emotional distress and depression soon after they are diagnosed. °· Having diabetes means that you are at a higher risk for depression. Ask your health care provider to recommend a therapist who understands both depression and diabetes. °· If you experience symptoms of emotional distress or depression, it is important to discuss this with your health care provider, certified diabetes educator, or therapist. °This information is not intended to replace advice given to you by   your health care provider. Make sure you discuss any questions you have with your health care provider. °Document Released: 01/22/2017 Document Revised: 01/22/2017 Document Reviewed: 01/22/2017 °Elsevier Interactive Patient Education © 2018 [employer]vier Inc. ° °

## 2018-03-26 NOTE — Progress Notes (Signed)
CRITICAL VALUE ALERT  Critical Value: FSBS 453    Date & Time Notied:  7/5 @0816    Provider Notified: Luberta MutterKonidena  Orders Received/Actions taken: VO to Give 5 units of novolog plus sliding scale coverage of 5 units for total of 10 units novolog insulin.

## 2018-03-26 NOTE — Progress Notes (Signed)
Nsg Discharge Note  Admit Date:  03/24/2018 Discharge date: 03/26/2018   Crista ElliotMargie E Simmons Cohn to be D/C'd Home per MD order.  AVS completed.  Copy for chart, and copy for patient signed, and dated. Patient/caregiver able to verbalize understanding.  Discharge Medication: Allergies as of 03/26/2018      Reactions   Latex Rash      Medication List    TAKE these medications   aspirin 81 MG EC tablet Take 1 tablet (81 mg total) by mouth daily.   ciprofloxacin 500 MG tablet Commonly known as:  CIPRO Take 1 tablet (500 mg total) by mouth 2 (two) times daily for 5 days.   insulin NPH-regular Human (70-30) 100 UNIT/ML injection Commonly known as:  NOVOLIN 70/30 20 units subcu twice daily What changed:    how much to take  how to take this  when to take this  additional instructions   promethazine 12.5 MG tablet Commonly known as:  PHENERGAN Take 1 tablet (12.5 mg total) by mouth every 6 (six) hours as needed for nausea or vomiting.       Discharge Assessment: Vitals:   03/25/18 1958 03/26/18 0611  BP: 105/65 (!) 102/45  Pulse: 71 80  Resp: 18   Temp: 98.6 F (37 C)   SpO2: 98% 98%   Skin clean, dry and intact without evidence of skin break down, no evidence of skin tears noted. IV catheter discontinued intact. Site without signs and symptoms of complications - no redness or edema noted at insertion site, patient denies c/o pain - only slight tenderness at site.  Dressing with slight pressure applied.  D/c Instructions-Education: Discharge instructions given to patient/family with verbalized understanding. D/c education completed with patient/family including follow up instructions, medication list, d/c activities limitations if indicated, with other d/c instructions as indicated by MD - patient able to verbalize understanding, all questions fully answered. Patient instructed to return to ED, call 911, or call MD for any changes in condition.  Patient escorted via WC,  and D/C home via private auto.  Adair LaundryElizabeth A Latamara Melder, RN 03/26/2018 11:45 AM

## 2018-03-26 NOTE — Care Management Note (Signed)
Case Management Note  Patient Details  Name: Kristine ElliotMargie E Simmons Cohn MRN: [id number] Date of Birth: [birth year]  Subjective/Objective:                  Admitted to [facility] with diabetic ketoacidosis. Lives with mother, Glenetta HewVickie Sullivan [phone]). States the [facility] she [facility]. Does want to continue seeing this physician. Prescriptions are filled at "any [facility]."  Glucometer in the home. No insurance listed.  Family will transport Patient's telephone: [phone]  Action/Plan: [facility] application given 03/2017. Gave this information again.  Will sent message to [facility]   Expected Discharge Date:  03/26/18               Expected Discharge Plan:     In-House Referral:     Discharge planning Services     Post Acute Care Choice:    Choice offered to:     DME Arranged:    DME Agency:     HH Arranged:    HH Agency:     Status of Service:     If discussed at [employer] Meetings, dates discussed:    Additional Comments:  Gwenette GreetBrenda S Olukemi Panchal, RN MAN [facility] Care Management [phone] 03/26/2018, 8:29 AM

## 2018-03-26 NOTE — Progress Notes (Signed)
CRITICAL VALUE ALERT  Critical Value:  Glucose 546  Date & Time Notied:  03/26/2018 [id number]  Provider Notified: Barbaraann RondoPrasanna Sridharan, MD  Orders Received/Actions taken: Administer 10 units of novolog SQ and reassess.

## 2018-03-27 LAB — URINE CULTURE

## 2018-06-11 ENCOUNTER — Other Ambulatory Visit: Payer: Self-pay

## 2018-06-11 ENCOUNTER — Emergency Department: Payer: Self-pay

## 2018-06-11 ENCOUNTER — Emergency Department
Admission: EM | Admit: 2018-06-11 | Discharge: 2018-06-11 | Disposition: A | Discharge status: Home / Self Care | Payer: Self-pay | Attending: Emergency Medicine | Admitting: Emergency Medicine

## 2018-06-11 ENCOUNTER — Encounter: Payer: Self-pay | Admitting: Emergency Medicine

## 2018-06-11 DIAGNOSIS — N3090 Cystitis, unspecified without hematuria: Secondary | ICD-10-CM | POA: Insufficient documentation

## 2018-06-11 DIAGNOSIS — Z79899 Other long term (current) drug therapy: Secondary | ICD-10-CM | POA: Insufficient documentation

## 2018-06-11 DIAGNOSIS — Z794 Long term (current) use of insulin: Secondary | ICD-10-CM

## 2018-06-11 DIAGNOSIS — F17210 Nicotine dependence, cigarettes, uncomplicated: Secondary | ICD-10-CM | POA: Insufficient documentation

## 2018-06-11 DIAGNOSIS — J209 Acute bronchitis, unspecified: Secondary | ICD-10-CM | POA: Insufficient documentation

## 2018-06-11 DIAGNOSIS — R0981 Nasal congestion: Secondary | ICD-10-CM | POA: Insufficient documentation

## 2018-06-11 DIAGNOSIS — E109 Type 1 diabetes mellitus without complications: Secondary | ICD-10-CM | POA: Insufficient documentation

## 2018-06-11 DIAGNOSIS — E119 Type 2 diabetes mellitus without complications: Secondary | ICD-10-CM

## 2018-06-11 DIAGNOSIS — Z7982 Long term (current) use of aspirin: Secondary | ICD-10-CM | POA: Insufficient documentation

## 2018-06-11 LAB — URINALYSIS, COMPLETE (UACMP) WITH MICROSCOPIC
Bilirubin Urine: NEGATIVE
Glucose, UA: NEGATIVE mg/dL
Hgb urine dipstick: NEGATIVE
KETONES UR: 80 mg/dL — AB
Nitrite: NEGATIVE
PH: 5 (ref 5.0–8.0)
PROTEIN: 100 mg/dL — AB
SPECIFIC GRAVITY, URINE: 1.028 (ref 1.005–1.030)
Squamous Epithelial / LPF: >50 — ABNORMAL HIGH (ref 0–5)

## 2018-06-11 LAB — BASIC METABOLIC PANEL
ANION GAP: 13 (ref 5–15)
BUN: 23 mg/dL — AB (ref 6–20)
CO2: 25 mmol/L (ref 22–32)
CREATININE: 0.7 mg/dL (ref 0.44–1.00)
Calcium: 9.1 mg/dL (ref 8.9–10.3)
Chloride: 99 mmol/L (ref 98–111)
GFR calc Af Amer: >60 mL/min (ref >60)
GFR calc non Af Amer: >60 mL/min (ref >60)
Glucose, Bld: 65 mg/dL — ABNORMAL LOW (ref 70–99)
POTASSIUM: 4 mmol/L (ref 3.5–5.1)
Sodium: 137 mmol/L (ref 135–145)

## 2018-06-11 LAB — CBC WITH DIFFERENTIAL/PLATELET
Basophils Absolute: 0 10*3/uL (ref 0–0.1)
Basophils Relative: 1 %
EOS ABS: 0 10*3/uL (ref 0–0.7)
EOS PCT: 0 %
HCT: 42.8 % (ref 35.0–47.0)
HEMOGLOBIN: 14.8 g/dL (ref 12.0–16.0)
LYMPHS ABS: 0.8 10*3/uL — AB (ref 1.0–3.6)
LYMPHS PCT: 18 %
MCH: 32.1 pg (ref 26.0–34.0)
MCHC: 34.6 g/dL (ref 32.0–36.0)
MCV: 93 fL (ref 80.0–100.0)
MONOS PCT: 13 %
Monocytes Absolute: 0.6 10*3/uL (ref 0.2–0.9)
Neutro Abs: 2.9 10*3/uL (ref 1.4–6.5)
Neutrophils Relative %: 68 %
PLATELETS: 196 10*3/uL (ref 150–440)
RBC: 4.6 MIL/uL (ref 3.80–5.20)
RDW: 14.4 % (ref 11.5–14.5)
WBC: 4.3 10*3/uL (ref 3.6–11.0)

## 2018-06-11 LAB — POCT PREGNANCY, URINE: PREG TEST UR: NEGATIVE

## 2018-06-11 MED ORDER — AZITHROMYCIN 250 MG PO TABS: ORAL_TABLET | ORAL | 0 refills | Status: DC

## 2018-06-11 MED ORDER — KETOROLAC TROMETHAMINE 30 MG/ML IJ SOLN
15.0000 mg | INTRAMUSCULAR | Status: AC
  Administered 2018-06-11: 15 mg via INTRAVENOUS
  Filled 2018-06-11: qty 1

## 2018-06-11 MED ORDER — NAPROXEN 500 MG PO TABS: 500.0000 mg | ORAL_TABLET | Freq: Two times a day (BID) | ORAL | 0 refills | Status: DC

## 2018-06-11 MED ORDER — ONDANSETRON 4 MG PO TBDP: 4.0000 mg | ORAL_TABLET | Freq: Three times a day (TID) | ORAL | 0 refills | Status: DC | PRN

## 2018-06-11 MED ORDER — SODIUM CHLORIDE 0.9 % IV BOLUS
1000.0000 mL | Freq: Once | INTRAVENOUS | Status: AC
  Administered 2018-06-11: 1000 mL via INTRAVENOUS

## 2018-06-11 MED ORDER — AZITHROMYCIN 500 MG PO TABS
500.0000 mg | ORAL_TABLET | Freq: Once | ORAL | Status: AC
  Administered 2018-06-11: 500 mg via ORAL
  Filled 2018-06-11: qty 1

## 2018-06-11 MED ORDER — IPRATROPIUM-ALBUTEROL 0.5-2.5 (3) MG/3ML IN SOLN
3.0000 mL | Freq: Once | RESPIRATORY_TRACT | Status: AC
  Administered 2018-06-11: 3 mL via RESPIRATORY_TRACT
  Filled 2018-06-11: qty 3

## 2018-06-11 MED ORDER — SODIUM CHLORIDE 0.9 % IV SOLN
2.0000 g | Freq: Once | INTRAVENOUS | Status: AC
  Administered 2018-06-11: 2 g via INTRAVENOUS
  Filled 2018-06-11: qty 20

## 2018-06-11 MED ORDER — BENZONATATE 100 MG PO CAPS: 100.0000 mg | ORAL_CAPSULE | Freq: Three times a day (TID) | ORAL | 0 refills | Status: DC | PRN

## 2018-06-11 MED ORDER — ALBUTEROL SULFATE HFA 108 (90 BASE) MCG/ACT IN AERS: 2.0000 | INHALATION_SPRAY | RESPIRATORY_TRACT | 0 refills | Status: DC | PRN

## 2018-06-11 MED ORDER — ALBUTEROL SULFATE (2.5 MG/3ML) 0.083% IN NEBU
5.0000 mg | INHALATION_SOLUTION | Freq: Once | RESPIRATORY_TRACT | Status: AC
  Administered 2018-06-11: 5 mg via RESPIRATORY_TRACT

## 2018-06-11 MED ORDER — ALBUTEROL SULFATE (2.5 MG/3ML) 0.083% IN NEBU
5.0000 mg | INHALATION_SOLUTION | Freq: Once | RESPIRATORY_TRACT | Status: DC
  Filled 2018-06-11: qty 6

## 2018-06-11 NOTE — ED Provider Notes (Signed)
[facility] Emergency Department Provider Note  ____________________________________________  Time seen: Approximately 11:00 PM  I have reviewed the triage vital signs and the nursing notes.   HISTORY  Chief Complaint Cough and Nasal Congestion    HPI Colin InaMargie E Jearld AdjutantSimmons Cohn is a 37 y.o. female with a history of insulin controlled diabetes, asthma, anxiety who complains of nonproductive cough, congestion, shortness of breath for the past 4 days, gradual onset, constant.  Denies chest pain.  No aggravating or alleviating factors, mild to moderate intensity.  No fever or chills.  Daughter was sick with similar symptoms last week.      Past Medical History:  Diagnosis Date  . Anxiety   . Asthma   . Diabetes mellitus without complication (HCC)   . WUJWJXBJ(478.2Headache(784.0)      Patient Active Problem List   Diagnosis Date Noted  . Edema of the tongue 03/26/2018  . DKA, type 1 (HCC) 03/24/2018  . Dehydration   . Esophageal reflux   . Diabetic ketoacidosis without coma associated with type 1 diabetes mellitus (HCC)   . DKA (diabetic ketoacidoses) (HCC) 05/03/2015  . Yeast infection 02/09/2015  . Acute renal failure syndrome (HCC)   . Tachycardia 11/03/2014  . Insomnia 09/08/2014  . Dizziness 09/08/2014  . Migraine, unspecified 12/14/2013  . Elevated troponin I level 11/19/2013  . Acute myocardial infarction, subendocardial infarction, initial episode of care (HCC) 11/18/2013  . Noncompliance with medication regimen 11/15/2013  . Leg pain 09/08/2013  . Loss of weight 09/08/2013  . Depression 09/08/2013  . Hypotension, unspecified 09/08/2013  . Tobacco use disorder 08/01/2013  . Diabetes mellitus type 1, uncontrolled (HCC) 06/24/2012  . GERD (gastroesophageal reflux disease) 06/17/2012     Past Surgical History:  Procedure Laterality Date  . NO PAST SURGERIES       Prior to Admission medications   Medication Sig Start Date End Date Taking?  Authorizing Provider  albuterol (PROVENTIL HFA) 108 (90 Base) MCG/ACT inhaler Inhale 2 puffs into the lungs every 4 (four) hours as needed for wheezing or shortness of breath. 06/11/18   Sharman CheekStafford, Malajah Oceguera, MD  aspirin EC 81 MG EC tablet Take 1 tablet (81 mg total) by mouth daily. Patient not taking: Reported on 03/24/2018 11/20/13   Nani RavensWight, Andrew M, MD  azithromycin (ZITHROMAX Z-PAK) 250 MG tablet Take 2 tablets (500 mg) on  Day 1,  followed by 1 tablet (250 mg) once daily on Days 2 through 5. 06/11/18   Sharman CheekStafford, Jerlene Rockers, MD  benzonatate (TESSALON PERLES) 100 MG capsule Take 1 capsule (100 mg total) by mouth 3 (three) times daily as needed for cough. 06/11/18 06/11/19  Sharman CheekStafford, Jaymere Alen, MD  insulin NPH-regular Human (NOVOLIN 70/30) (70-30) 100 UNIT/ML injection 20 units subcu twice daily 03/26/18   Katha HammingKonidena, Snehalatha, MD  naproxen (NAPROSYN) 500 MG tablet Take 1 tablet (500 mg total) by mouth 2 (two) times daily with a meal. 06/11/18   Sharman CheekStafford, Eriberto Felch, MD  ondansetron (ZOFRAN ODT) 4 MG disintegrating tablet Take 1 tablet (4 mg total) by mouth every 8 (eight) hours as needed for nausea or vomiting. 06/11/18   Sharman CheekStafford, Jaliza Seifried, MD  promethazine (PHENERGAN) 12.5 MG tablet Take 1 tablet (12.5 mg total) by mouth every 6 (six) hours as needed for nausea or vomiting. Patient not taking: Reported on 03/24/2018 01/31/18   Willy Eddyobinson, Patrick, MD     Allergies Latex   Family History  Problem Relation Age of Onset  . Heart disease Unknown  No family history    Social History Social History   Tobacco Use  . Smoking status: Current Every Day Smoker    Packs/day: 0.25    Years: 20.00    Pack years: 5.00    Types: Cigarettes  . Smokeless tobacco: Never Used  Substance Use Topics  . Alcohol use: No    Alcohol/week: 0.0 standard drinks  . Drug use: No    Review of Systems  Constitutional:   No fever or chills.  ENT:   Positive sore throat. No rhinorrhea. Cardiovascular:   No chest pain or  syncope. Respiratory:   Positive shortness of breath and nonproductive cough. Gastrointestinal:   Negative for abdominal pain, vomiting and diarrhea.  Musculoskeletal:   Negative for focal pain or swelling All other systems reviewed and are negative except as documented above in ROS and HPI.  ____________________________________________   PHYSICAL EXAM:  VITAL SIGNS: ED Triage Vitals  Enc Vitals Group     BP 06/11/18 2003 106/66     Pulse Rate 06/11/18 2003 99     Resp 06/11/18 2003 18     Temp 06/11/18 2003 98.9 F (37.2 C)     Temp Source 06/11/18 2003 Oral     SpO2 06/11/18 2003 91 %     Weight 06/11/18 2006 121 lb (54.9 kg)     Height 06/11/18 2006 5\' 4"  (1.626 m)     Head Circumference --      Peak Flow --      Pain Score 06/11/18 2006 8     Pain Loc --      Pain Edu? --      Excl. in GC? --     Vital signs reviewed, nursing assessments reviewed.   Constitutional:   Alert and oriented. Non-toxic appearance. Eyes:   Conjunctivae are normal. EOMI. PERRL. ENT      Head:   Normocephalic and atraumatic.      Nose:   No congestion/rhinnorhea.       Mouth/Throat:   MMM, no pharyngeal erythema. No peritonsillar mass.       Neck:   No meningismus. Full ROM. Hematological/Lymphatic/Immunilogical:   No cervical lymphadenopathy. Cardiovascular:   RRR. Symmetric bilateral radial and DP pulses.  No murmurs. Cap refill less than 2 seconds. Respiratory:   Normal respiratory effort without tachypnea/retractions.  Diffuse expiratory wheezing, accentuated by FEV1 maneuver was slightly prolonged expiratory phase. Gastrointestinal:   Soft and nontender. Non distended. There is no CVA tenderness.  No rebound, rigidity, or guarding.  Musculoskeletal:   Normal range of motion in all extremities. No joint effusions.  No lower extremity tenderness.  No edema.  No evidence of DVT Neurologic:   Normal speech and language.  Motor grossly intact. No acute focal neurologic deficits are  appreciated.  Skin:    Skin is warm, dry and intact. No rash noted.  No petechiae, purpura, or bullae.  ____________________________________________    LABS (pertinent positives/negatives) (all labs ordered are listed, but only abnormal results are displayed) Labs Reviewed  URINALYSIS, COMPLETE (UACMP) WITH MICROSCOPIC - Abnormal; Notable for the following components:      Result Value   Color, Urine AMBER (*)    APPearance CLOUDY (*)    Ketones, ur 80 (*)    Protein, ur 100 (*)    Leukocytes, UA MODERATE (*)    Bacteria, UA MANY (*)    Squamous Epithelial / LPF >50 (*)    All other components within normal limits  BASIC METABOLIC  PANEL - Abnormal; Notable for the following components:   Glucose, Bld 65 (*)    BUN 23 (*)    All other components within normal limits  CBC WITH DIFFERENTIAL/PLATELET - Abnormal; Notable for the following components:   Lymphs Abs 0.8 (*)    All other components within normal limits  URINE CULTURE  POC URINE PREG, ED  POCT PREGNANCY, URINE  CBG MONITORING, ED   ____________________________________________   EKG    ____________________________________________    RADIOLOGY  Dg Chest 2 View  Result Date: 06/11/2018 CLINICAL DATA:  Cough with wheezing EXAM: CHEST - 2 VIEW COMPARISON:  03/24/2018 FINDINGS: Hyperinflation. No focal opacity or pleural effusion. Normal heart size. No pneumothorax. IMPRESSION: No active cardiopulmonary disease. Electronically Signed   By: Jasmine Pang M.D.   On: 06/11/2018 20:31    ____________________________________________   PROCEDURES Procedures  ____________________________________________  DIFFERENTIAL DIAGNOSIS   Pneumonia, acute bronchitis, DKA  CLINICAL IMPRESSION / ASSESSMENT AND PLAN / ED COURSE  Pertinent labs & imaging results that were available during my care of the patient were reviewed by me and considered in my medical decision making (see chart for details).    Patient presents  with shortness of breath and wheezing.  Chest x-ray is normal, but exam is consistent with acute bronchitis.  Urinalysis shows a urinary tract infection although asymptomatic according to the patient.  Also surgery large amount of ketones and with her insulin-dependent diabetes, worried that she could have acidosis.  Check labs which actually are normal.  Patient given ceftriaxone IV to treat the urinary tract infection and will be continued on azithromycin outpatient for the bronchitis.  I am avoiding steroid use due to her diabetes but will also continue her on NSAIDs and albuterol.  Doubt ACS PE dissection or pneumothorax.      ____________________________________________   FINAL CLINICAL IMPRESSION(S) / ED DIAGNOSES    Final diagnoses:  Acute bronchitis, unspecified organism  Cystitis  Type 2 diabetes mellitus without complication, with long-term current use of insulin [facility])     ED Discharge Orders         Ordered    azithromycin (ZITHROMAX Z-PAK) 250 MG tablet     06/11/18 2259    naproxen (NAPROSYN) 500 MG tablet  2 times daily with meals     06/11/18 2259    albuterol (PROVENTIL HFA) 108 (90 Base) MCG/ACT inhaler  Every 4 hours PRN     06/11/18 2259    benzonatate (TESSALON PERLES) 100 MG capsule  3 times daily PRN     06/11/18 2259    ondansetron (ZOFRAN ODT) 4 MG disintegrating tablet  Every 8 hours PRN     06/11/18 2259          Portions of this note were generated with dragon dictation software. Dictation errors may occur despite best attempts at proofreading.    Sharman Cheek, MD 06/11/18 2303

## 2018-06-11 NOTE — ED Triage Notes (Signed)
Patient ambulatory to triage with complaints of coughing and congestion over the last 4 days. Pt reports daughter had similar s/sx.  Pt reports OTC decongestants are ineffective, reports rattle in chest but no production.  Hx of asthma but no inhaler use for "years".  Pt reports smoking .25 ppd, drinking in triage.  C/O back pain with cough.

## 2018-06-11 NOTE — Discharge Instructions (Signed)
Your blood test today were normal.  Continue eating regularly and drinking fluids to stay hydrated and take medications as prescribed to control your symptoms and prevent worsening.

## 2018-06-11 NOTE — ED Notes (Signed)
Esign not working pt verbalize discharge instructions

## 2018-06-13 LAB — URINE CULTURE: Culture: NO GROWTH

## 2018-07-08 ENCOUNTER — Emergency Department
Admission: EM | Admit: 2018-07-08 | Discharge: 2018-07-08 | Disposition: A | Discharge status: Home / Self Care | Payer: Self-pay | Attending: Emergency Medicine | Admitting: Emergency Medicine

## 2018-07-08 ENCOUNTER — Other Ambulatory Visit: Payer: Self-pay

## 2018-07-08 DIAGNOSIS — Y9389 Activity, other specified: Secondary | ICD-10-CM | POA: Insufficient documentation

## 2018-07-08 DIAGNOSIS — F17210 Nicotine dependence, cigarettes, uncomplicated: Secondary | ICD-10-CM | POA: Insufficient documentation

## 2018-07-08 DIAGNOSIS — Y929 Unspecified place or not applicable: Secondary | ICD-10-CM | POA: Insufficient documentation

## 2018-07-08 DIAGNOSIS — J45909 Unspecified asthma, uncomplicated: Secondary | ICD-10-CM | POA: Insufficient documentation

## 2018-07-08 DIAGNOSIS — Z794 Long term (current) use of insulin: Secondary | ICD-10-CM | POA: Insufficient documentation

## 2018-07-08 DIAGNOSIS — I252 Old myocardial infarction: Secondary | ICD-10-CM | POA: Insufficient documentation

## 2018-07-08 DIAGNOSIS — S0502XA Injury of conjunctiva and corneal abrasion without foreign body, left eye, initial encounter: Secondary | ICD-10-CM | POA: Insufficient documentation

## 2018-07-08 DIAGNOSIS — H1089 Other conjunctivitis: Secondary | ICD-10-CM | POA: Insufficient documentation

## 2018-07-08 DIAGNOSIS — E109 Type 1 diabetes mellitus without complications: Secondary | ICD-10-CM | POA: Insufficient documentation

## 2018-07-08 DIAGNOSIS — H1032 Unspecified acute conjunctivitis, left eye: Secondary | ICD-10-CM

## 2018-07-08 DIAGNOSIS — F329 Major depressive disorder, single episode, unspecified: Secondary | ICD-10-CM | POA: Insufficient documentation

## 2018-07-08 DIAGNOSIS — Z79899 Other long term (current) drug therapy: Secondary | ICD-10-CM | POA: Insufficient documentation

## 2018-07-08 DIAGNOSIS — Z91040 Latex allergy status: Secondary | ICD-10-CM | POA: Insufficient documentation

## 2018-07-08 DIAGNOSIS — Y998 Other external cause status: Secondary | ICD-10-CM | POA: Insufficient documentation

## 2018-07-08 DIAGNOSIS — X58XXXA Exposure to other specified factors, initial encounter: Secondary | ICD-10-CM | POA: Insufficient documentation

## 2018-07-08 DIAGNOSIS — F419 Anxiety disorder, unspecified: Secondary | ICD-10-CM | POA: Insufficient documentation

## 2018-07-08 MED ORDER — FLUORESCEIN SODIUM 1 MG OP STRP
1.0000 | ORAL_STRIP | Freq: Once | OPHTHALMIC | Status: AC
  Administered 2018-07-08: 1 via OPHTHALMIC
  Filled 2018-07-08: qty 1

## 2018-07-08 MED ORDER — TETRACAINE HCL 0.5 % OP SOLN
2.0000 [drp] | Freq: Once | OPHTHALMIC | Status: AC
  Administered 2018-07-08: 2 [drp] via OPHTHALMIC
  Filled 2018-07-08: qty 4

## 2018-07-08 MED ORDER — CIPROFLOXACIN HCL 0.3 % OP SOLN
2.0000 [drp] | Freq: Once | OPHTHALMIC | Status: AC
  Administered 2018-07-08: 2 [drp] via OPHTHALMIC
  Filled 2018-07-08: qty 2.5

## 2018-07-08 MED ORDER — CIPROFLOXACIN HCL 0.3 % OP SOLN: 1.0000 [drp] | OPHTHALMIC | 0 refills | Status: AC

## 2018-07-08 NOTE — ED Provider Notes (Signed)
[facility] EMERGENCY DEPARTMENT Provider Note   CSN: [id number] Arrival date & time: 07/08/18  1949     History   Chief Complaint Chief Complaint  Patient presents with  . Conjunctivitis    HPI Kristine Allison Kristine Allison is a 37 y.o. female.  Presents to the emergency department for evaluation of left eye redness, drainage.  Symptoms are present since this morning.  When she woke up this morning she had some matting to the left eye.  Patient states couple days ago she had dirty water from dirty dishes in her left eye at work.  She denies any current vision changes, eye pain.  She feels as if she does have something under her eyelid.  No nausea vomiting or rashes.  HPI  Past Medical History:  Diagnosis Date  . Anxiety   . Asthma   . Diabetes mellitus without complication (HCC)   . [id number](308.6)     Patient Active Problem List   Diagnosis Date Noted  . Edema of the tongue 03/26/2018  . DKA, type 1 (HCC) 03/24/2018  . Dehydration   . Esophageal reflux   . Diabetic ketoacidosis without coma associated with type 1 diabetes mellitus (HCC)   . DKA (diabetic ketoacidoses) (HCC) 05/03/2015  . Yeast infection 02/09/2015  . Acute renal failure syndrome (HCC)   . Tachycardia 11/03/2014  . Insomnia 09/08/2014  . Dizziness 09/08/2014  . Migraine, unspecified 12/14/2013  . Elevated troponin I level 11/19/2013  . Acute myocardial infarction, subendocardial infarction, initial episode of care (HCC) 11/18/2013  . Noncompliance with medication regimen 11/15/2013  . Leg pain 09/08/2013  . Loss of weight 09/08/2013  . Depression 09/08/2013  . Hypotension, unspecified 09/08/2013  . Tobacco use disorder 08/01/2013  . Diabetes mellitus type 1, uncontrolled (HCC) 06/24/2012  . GERD (gastroesophageal reflux disease) 06/17/2012    Past Surgical History:  Procedure Laterality Date  . NO PAST SURGERIES       OB History   None      Home Medications    Prior  to Admission medications   Medication Sig Start Date End Date Taking? Authorizing Provider  albuterol (PROVENTIL HFA) 108 (90 Base) MCG/ACT inhaler Inhale 2 puffs into the lungs every 4 (four) hours as needed for wheezing or shortness of breath. 06/11/18   Sharman Cheek, MD  aspirin EC 81 MG EC tablet Take 1 tablet (81 mg total) by mouth daily. Patient not taking: Reported on 03/24/2018 11/20/13   Nani Ravens, MD  azithromycin (ZITHROMAX Z-PAK) 250 MG tablet Take 2 tablets (500 mg) on  Day 1,  followed by 1 tablet (250 mg) once daily on Days 2 through 5. 06/11/18   Sharman Cheek, MD  benzonatate (TESSALON PERLES) 100 MG capsule Take 1 capsule (100 mg total) by mouth 3 (three) times daily as needed for cough. 06/11/18 06/11/19  Sharman Cheek, MD  ciprofloxacin (CILOXAN) 0.3 % ophthalmic solution Place 1 drop into both eyes every 2 (two) hours for 5 days. Administer 1 drop, every 2 hours, while awake, for 2 days. Then 1 drop, every 4 hours, while awake, for the next 5 days. 07/08/18 07/13/18  Evon Slack, PA-C  insulin NPH-regular Human (NOVOLIN 70/30) (70-30) 100 UNIT/ML injection 20 units subcu twice daily 03/26/18   Katha Hamming, MD  naproxen (NAPROSYN) 500 MG tablet Take 1 tablet (500 mg total) by mouth 2 (two) times daily with a meal. 06/11/18   Sharman Cheek, MD  ondansetron (ZOFRAN ODT) 4 MG disintegrating  tablet Take 1 tablet (4 mg total) by mouth every 8 (eight) hours as needed for nausea or vomiting. 06/11/18   Sharman Cheek, MD  promethazine (PHENERGAN) 12.5 MG tablet Take 1 tablet (12.5 mg total) by mouth every 6 (six) hours as needed for nausea or vomiting. Patient not taking: Reported on 03/24/2018 01/31/18   Willy Eddy, MD    Family History Family History  Problem Relation Age of Onset  . Heart disease Unknown        No family history    Social History Social History   Tobacco Use  . Smoking status: Current Every Day Smoker    Packs/day: 0.25     Years: 20.00    Pack years: 5.00    Types: Cigarettes  . Smokeless tobacco: Never Used  Substance Use Topics  . Alcohol use: No    Alcohol/week: 0.0 standard drinks  . Drug use: No     Allergies   Latex   Review of Systems Review of Systems  Constitutional: Negative for fever.  Eyes: Positive for pain, discharge and redness. Negative for photophobia and visual disturbance.  Gastrointestinal: Negative for nausea and vomiting.  Skin: Negative for rash and wound.  Neurological: Negative for headaches.     Physical Exam Updated Vital Signs BP (!) 109/59 (BP Location: Left Arm)   Pulse (!) 104   Temp 98.3 F (36.8 C) (Oral)   Resp 20   Ht 5\' 4"  (1.626 m)   Wt 54.9 kg   LMP 10/23/2013   SpO2 95%   BMI 20.77 kg/m   Physical Exam  Constitutional: She is oriented to person, place, and time. She appears well-developed and well-nourished.  HENT:  Head: Normocephalic and atraumatic.  Eyes: Pupils are equal, round, and reactive to light. EOM and lids are normal. Lids are everted and swept, no foreign bodies found. Left eye exhibits no discharge and no exudate. No foreign body present in the left eye. Left conjunctiva is injected.  Slit lamp exam:      The left eye shows fluorescein uptake (Small pinpoint abrasion to the left central cornea). The left eye shows no corneal ulcer, no foreign body and no anterior chamber bulge.  Neck: Normal range of motion.  Cardiovascular: Normal rate.  Pulmonary/Chest: Effort normal. No respiratory distress.  Musculoskeletal: Normal range of motion.  Neurological: She is alert and oriented to person, place, and time.  Skin: Skin is warm. No rash noted.  Psychiatric: She has a normal mood and affect. Her behavior is normal. Thought content normal.  Nursing note and vitals reviewed.    ED Treatments / Results  Labs (all labs ordered are listed, but only abnormal results are displayed) Labs Reviewed - No data to  display  EKG None  Radiology No results found.  Procedures Procedures (including critical care time)  Medications Ordered in ED Medications  fluorescein ophthalmic strip 1 strip (has no administration in time range)  tetracaine (PONTOCAINE) 0.5 % ophthalmic solution 2 drop (has no administration in time range)  ciprofloxacin (CILOXAN) 0.3 % ophthalmic solution 2 drop (has no administration in time range)     Initial Impression / Assessment and Plan / ED Course  I have reviewed the triage vital signs and the nursing notes.  Pertinent labs & imaging results that were available during my care of the patient were reviewed by me and considered in my medical decision making (see chart for details).     37 year old female with small corneal abrasion to the  left eye with mild conjunctivitis.  She is started on Ciloxan eyedrops due to history of contact use.  She will avoid contact us for 7 days and after symptoms have resolved.  She understands signs symptoms return to ED for.  Final Clinical Impressions(s) / ED Diagnoses   Final diagnoses:  Corneal abrasion, left, initial encounter  Acute bacterial conjunctivitis of left eye    ED Discharge Orders         Ordered    ciprofloxacin (CILOXAN) 0.3 % ophthalmic solution  Every 2 hours     07/08/18 2119           Ronnette Juniper 07/08/18 2123    Myrna Blazer, MD 07/08/18 2248

## 2018-07-08 NOTE — Discharge Instructions (Addendum)
Please use eyedrops as prescribed.  If any increasing eye pain, vision changes return to the emergency department.  No improvement to 3 days follow-up with ophthalmologist.

## 2018-07-08 NOTE — ED Triage Notes (Signed)
Pt reports waking up with left eye redness and pain.  No known injury to left eye.  Left eye draining.  Pt alert.

## 2018-07-08 NOTE — ED Notes (Signed)
Fluorescein, tetracaine and ciloxan given to pa.

## 2018-08-07 ENCOUNTER — Inpatient Hospital Stay
Admission: EM | Admit: 2018-08-07 | Discharge: 2018-08-09 | DRG: 639 | Disposition: A | Discharge status: Home / Self Care | Payer: Self-pay | Attending: Internal Medicine | Admitting: Internal Medicine

## 2018-08-07 ENCOUNTER — Other Ambulatory Visit: Payer: Self-pay

## 2018-08-07 DIAGNOSIS — F17210 Nicotine dependence, cigarettes, uncomplicated: Secondary | ICD-10-CM | POA: Diagnosis present

## 2018-08-07 DIAGNOSIS — Z794 Long term (current) use of insulin: Secondary | ICD-10-CM

## 2018-08-07 DIAGNOSIS — E1110 Type 2 diabetes mellitus with ketoacidosis without coma: Secondary | ICD-10-CM | POA: Diagnosis present

## 2018-08-07 DIAGNOSIS — Z23 Encounter for immunization: Secondary | ICD-10-CM

## 2018-08-07 DIAGNOSIS — J45909 Unspecified asthma, uncomplicated: Secondary | ICD-10-CM | POA: Diagnosis present

## 2018-08-07 DIAGNOSIS — Z791 Long term (current) use of non-steroidal anti-inflammatories (NSAID): Secondary | ICD-10-CM

## 2018-08-07 DIAGNOSIS — Z66 Do not resuscitate: Secondary | ICD-10-CM | POA: Diagnosis present

## 2018-08-07 DIAGNOSIS — E1010 Type 1 diabetes mellitus with ketoacidosis without coma: Principal | ICD-10-CM | POA: Diagnosis present

## 2018-08-07 DIAGNOSIS — K219 Gastro-esophageal reflux disease without esophagitis: Secondary | ICD-10-CM | POA: Diagnosis present

## 2018-08-07 DIAGNOSIS — Z91040 Latex allergy status: Secondary | ICD-10-CM

## 2018-08-07 DIAGNOSIS — F419 Anxiety disorder, unspecified: Secondary | ICD-10-CM | POA: Diagnosis present

## 2018-08-07 LAB — BLOOD GAS, VENOUS
Acid-base deficit: 16.9 mmol/L — ABNORMAL HIGH (ref 0.0–2.0)
BICARBONATE: 8.6 mmol/L — AB (ref 20.0–28.0)
O2 Saturation: 96.9 %
PCO2 VEN: 21 mmHg — AB (ref 44.0–60.0)
PH VEN: 7.22 — AB (ref 7.250–7.430)
Patient temperature: 37
pO2, Ven: 107 mmHg — ABNORMAL HIGH (ref 32.0–45.0)

## 2018-08-07 LAB — GLUCOSE, CAPILLARY
GLUCOSE-CAPILLARY: 257 mg/dL — AB (ref 70–99)
Glucose-Capillary: 349 mg/dL — ABNORMAL HIGH (ref 70–99)
Glucose-Capillary: 258 mg/dL — ABNORMAL HIGH (ref 70–99)

## 2018-08-07 LAB — COMPREHENSIVE METABOLIC PANEL
ALT: 20 U/L (ref 0–44)
AST: 16 U/L (ref 15–41)
Albumin: 4.8 g/dL (ref 3.5–5.0)
Alkaline Phosphatase: 109 U/L (ref 38–126)
Anion gap: 26 — ABNORMAL HIGH (ref 5–15)
BUN: 27 mg/dL — AB (ref 6–20)
CALCIUM: 9.2 mg/dL (ref 8.9–10.3)
CO2: 13 mmol/L — ABNORMAL LOW (ref 22–32)
CREATININE: 1.28 mg/dL — AB (ref 0.44–1.00)
Chloride: 99 mmol/L (ref 98–111)
GFR calc Af Amer: >60 mL/min (ref >60)
GFR calc non Af Amer: 53 mL/min — ABNORMAL LOW (ref >60)
GLUCOSE: 399 mg/dL — AB (ref 70–99)
Potassium: 4.9 mmol/L (ref 3.5–5.1)
SODIUM: 138 mmol/L (ref 135–145)
Total Bilirubin: 1.7 mg/dL — ABNORMAL HIGH (ref 0.3–1.2)
Total Protein: 8.1 g/dL (ref 6.5–8.1)

## 2018-08-07 LAB — CBC WITH DIFFERENTIAL/PLATELET
ABS IMMATURE GRANULOCYTES: 0.04 10*3/uL (ref 0.00–0.07)
Basophils Absolute: 0 10*3/uL (ref 0.0–0.1)
Basophils Relative: 0 %
EOS ABS: 0 10*3/uL (ref 0.0–0.5)
Eosinophils Relative: 0 %
HCT: 47.3 % — ABNORMAL HIGH (ref 36.0–46.0)
Hemoglobin: 15.5 g/dL — ABNORMAL HIGH (ref 12.0–15.0)
Immature Granulocytes: 0 %
Lymphocytes Relative: 8 %
Lymphs Abs: 0.8 10*3/uL (ref 0.7–4.0)
MCH: 30.5 pg (ref 26.0–34.0)
MCHC: 32.8 g/dL (ref 30.0–36.0)
MCV: 93.1 fL (ref 80.0–100.0)
MONO ABS: 0.3 10*3/uL (ref 0.1–1.0)
Monocytes Relative: 3 %
NEUTROS ABS: 8.9 10*3/uL — AB (ref 1.7–7.7)
NEUTROS PCT: 89 %
PLATELETS: 331 10*3/uL (ref 150–400)
RBC: 5.08 MIL/uL (ref 3.87–5.11)
RDW: 13 % (ref 11.5–15.5)
WBC: 10 10*3/uL (ref 4.0–10.5)
nRBC: 0 % (ref 0.0–0.2)

## 2018-08-07 LAB — URINALYSIS, COMPLETE (UACMP) WITH MICROSCOPIC
BACTERIA UA: NONE SEEN
BILIRUBIN URINE: NEGATIVE
Glucose, UA: >=500 mg/dL — AB
Ketones, ur: 80 mg/dL — AB
Leukocytes, UA: NEGATIVE
Nitrite: NEGATIVE
Protein, ur: NEGATIVE mg/dL
Specific Gravity, Urine: 1.016 (ref 1.005–1.030)
pH: 5 (ref 5.0–8.0)

## 2018-08-07 LAB — LIPASE, BLOOD: Lipase: 48 U/L (ref 11–51)

## 2018-08-07 LAB — TROPONIN I: Troponin I: <0.03 ng/mL (ref <0.03)

## 2018-08-07 LAB — BETA-HYDROXYBUTYRIC ACID: Beta-Hydroxybutyric Acid: >8 mmol/L — ABNORMAL HIGH (ref 0.05–0.27)

## 2018-08-07 MED ORDER — INSULIN REGULAR BOLUS VIA INFUSION
0.0000 [IU] | Freq: Three times a day (TID) | INTRAVENOUS | Status: DC
  Filled 2018-08-07: qty 10

## 2018-08-07 MED ORDER — POTASSIUM CHLORIDE 10 MEQ/100ML IV SOLN
10.0000 meq | INTRAVENOUS | Status: AC
  Administered 2018-08-08: 10 meq via INTRAVENOUS
  Filled 2018-08-07 (×2): qty 100

## 2018-08-07 MED ORDER — ENOXAPARIN SODIUM 40 MG/0.4ML ~~LOC~~ SOLN
40.0000 mg | SUBCUTANEOUS | Status: DC
  Administered 2018-08-07 – 2018-08-08 (×2): 40 mg via SUBCUTANEOUS
  Filled 2018-08-07 (×2): qty 0.4

## 2018-08-07 MED ORDER — INSULIN REGULAR(HUMAN) IN NACL 100-0.9 UT/100ML-% IV SOLN
INTRAVENOUS | Status: DC
  Filled 2018-08-07: qty 100

## 2018-08-07 MED ORDER — DEXTROSE-NACL 5-0.45 % IV SOLN
INTRAVENOUS | Status: DC
  Administered 2018-08-07: 23:00:00 via INTRAVENOUS

## 2018-08-07 MED ORDER — SODIUM CHLORIDE 0.9 % IV SOLN: INTRAVENOUS | Status: DC

## 2018-08-07 MED ORDER — DEXTROSE-NACL 5-0.45 % IV SOLN
INTRAVENOUS | Status: DC
  Administered 2018-08-07 – 2018-08-08 (×2): via INTRAVENOUS

## 2018-08-07 MED ORDER — ASPIRIN EC 81 MG PO TBEC
81.0000 mg | DELAYED_RELEASE_TABLET | Freq: Every day | ORAL | Status: DC
  Administered 2018-08-09: 09:00:00 81 mg via ORAL
  Filled 2018-08-07: qty 1

## 2018-08-07 MED ORDER — SODIUM CHLORIDE 0.9 % IV SOLN: INTRAVENOUS | Status: AC

## 2018-08-07 MED ORDER — SODIUM CHLORIDE 0.45 % IV SOLN: INTRAVENOUS | Status: DC

## 2018-08-07 MED ORDER — INSULIN REGULAR(HUMAN) IN NACL 100-0.9 UT/100ML-% IV SOLN
INTRAVENOUS | Status: DC
  Administered 2018-08-07: 2 [IU]/h via INTRAVENOUS
  Filled 2018-08-07: qty 100

## 2018-08-07 MED ORDER — SODIUM CHLORIDE 0.9 % IV SOLN
Freq: Once | INTRAVENOUS | Status: AC
  Administered 2018-08-07: 21:00:00 via INTRAVENOUS

## 2018-08-07 MED ORDER — INSULIN ASPART 100 UNIT/ML ~~LOC~~ SOLN
5.0000 [IU] | Freq: Once | SUBCUTANEOUS | Status: AC
  Administered 2018-08-07: 5 [IU] via SUBCUTANEOUS
  Filled 2018-08-07: qty 1

## 2018-08-07 MED ORDER — INSULIN ASPART 100 UNIT/ML ~~LOC~~ SOLN: 10.0000 [IU] | Freq: Once | SUBCUTANEOUS | Status: DC

## 2018-08-07 MED ORDER — DEXTROSE 50 % IV SOLN
25.0000 mL | INTRAVENOUS | Status: DC | PRN
  Administered 2018-08-08 (×2): 25 mL via INTRAVENOUS
  Filled 2018-08-07: qty 50

## 2018-08-07 MED ORDER — INFLUENZA VAC SPLIT QUAD 0.5 ML IM SUSY: 0.5000 mL | PREFILLED_SYRINGE | INTRAMUSCULAR | Status: DC

## 2018-08-07 NOTE — ED Provider Notes (Signed)
[facility] Emergency Department Provider Note       Time seen: ----------------------------------------- 9:00 PM on 08/07/2018 -----------------------------------------   I have reviewed the triage vital signs and the nursing notes.  HISTORY   Chief Complaint Hyperglycemia    HPI Kristine Allison is a 37 y.o. female with a history of anxiety, asthma, diabetes, DKA who presents to the ED for hyperglycemia.  Patient had nausea with vomiting diarrhea all day today.  Patient reports she is a type I diabetic with a CBG of 427 in route by EMS.  She reports taking around 32 units of total insulin at home today trying to bring her sugar down.  Currently she is feeling somewhat better.  Past Medical History:  Diagnosis Date  . Anxiety   . Asthma   . Diabetes mellitus without complication (HCC)   . ZOXWRUEA(540.9)     Patient Active Problem List   Diagnosis Date Noted  . Edema of the tongue 03/26/2018  . DKA, type 1 (HCC) 03/24/2018  . Dehydration   . Esophageal reflux   . Diabetic ketoacidosis without coma associated with type 1 diabetes mellitus (HCC)   . DKA (diabetic ketoacidoses) (HCC) 05/03/2015  . Yeast infection 02/09/2015  . Acute renal failure syndrome (HCC)   . Tachycardia 11/03/2014  . Insomnia 09/08/2014  . Dizziness 09/08/2014  . Migraine, unspecified 12/14/2013  . Elevated troponin I level 11/19/2013  . Acute myocardial infarction, subendocardial infarction, initial episode of care (HCC) 11/18/2013  . Noncompliance with medication regimen 11/15/2013  . Leg pain 09/08/2013  . Loss of weight 09/08/2013  . Depression 09/08/2013  . Hypotension, unspecified 09/08/2013  . Tobacco use disorder 08/01/2013  . Diabetes mellitus type 1, uncontrolled (HCC) 06/24/2012  . GERD (gastroesophageal reflux disease) 06/17/2012    Past Surgical History:  Procedure Laterality Date  . NO PAST SURGERIES      Allergies Latex  Social  History Social History   Tobacco Use  . Smoking status: Current Every Day Smoker    Packs/day: 0.25    Years: 20.00    Pack years: 5.00    Types: Cigarettes  . Smokeless tobacco: Never Used  Substance Use Topics  . Alcohol use: No    Alcohol/week: 0.0 standard drinks  . Drug use: No   Review of Systems Constitutional: Negative for fever. Cardiovascular: Negative for chest pain. Respiratory: Negative for shortness of breath. Gastrointestinal: Negative for abdominal pain, positive for vomiting and diarrhea  Musculoskeletal: Negative for back pain. Skin: Negative for rash. Neurological: Negative for headaches, positive for weakness  All systems negative/normal/unremarkable except as stated in the HPI  ____________________________________________   PHYSICAL EXAM:  VITAL SIGNS: ED Triage Vitals  Enc Vitals Group     BP 08/07/18 2040 128/88     Pulse Rate 08/07/18 2040 (!) 115     Resp 08/07/18 2040 18     Temp 08/07/18 2040 98.2 F (36.8 C)     Temp Source 08/07/18 2040 Oral     SpO2 08/07/18 2040 97 %     Weight 08/07/18 2038 121 lb (54.9 kg)     Height 08/07/18 2038 5\' 4"  (1.626 m)     Head Circumference --      Peak Flow --      Pain Score 08/07/18 2038 8     Pain Loc --      Pain Edu? --      Excl. in GC? --    Constitutional: Alert and oriented.  Well appearing and in no distress. ENT   Head: Normocephalic and atraumatic.   Nose: No congestion/rhinnorhea.   Mouth/Throat: Mucous membranes are dry   Neck: No stridor. Cardiovascular: Normal rate, regular rhythm. No murmurs, rubs, or gallops. Respiratory: Normal respiratory effort without tachypnea nor retractions. Breath sounds are clear and equal bilaterally. No wheezes/rales/rhonchi. Gastrointestinal: Soft and nontender. Normal bowel sounds Musculoskeletal: Nontender with normal range of motion in extremities. No lower extremity tenderness nor edema. Neurologic:  Normal speech and language. No  gross focal neurologic deficits are appreciated.  Skin:  Skin is warm, dry and intact. No rash noted. Psychiatric: Mood and affect are normal. Speech and behavior are normal.  ____________________________________________  ED COURSE:  As part of my medical decision making, I reviewed the following data within the electronic MEDICAL RECORD NUMBER History obtained from family if available, nursing notes, old chart and ekg, as well as notes from prior ED visits. Patient presented for nausea with vomiting and diarrhea, we will assess with labs and imaging as indicated at this time.   Procedures ____________________________________________   LABS (pertinent positives/negatives)  Labs Reviewed  GLUCOSE, CAPILLARY - Abnormal; Notable for the following components:      Result Value   Glucose-Capillary 349 (*)    All other components within normal limits  CBC WITH DIFFERENTIAL/PLATELET - Abnormal; Notable for the following components:   Hemoglobin 15.5 (*)    HCT 47.3 (*)    Neutro Abs 8.9 (*)    All other components within normal limits  COMPREHENSIVE METABOLIC PANEL - Abnormal; Notable for the following components:   CO2 13 (*)    Glucose, Bld 399 (*)    BUN 27 (*)    Creatinine, Ser 1.28 (*)    Total Bilirubin 1.7 (*)    GFR calc non Af Amer 53 (*)    Anion gap 26 (*)    All other components within normal limits  LIPASE, BLOOD  TROPONIN I  URINALYSIS, COMPLETE (UACMP) WITH MICROSCOPIC  BLOOD GAS, VENOUS  BETA-HYDROXYBUTYRIC ACID  ____________________________________________  CRITICAL CARE Performed by: Ulice Dash   Total critical care time: 30 minutes  Critical care time was exclusive of separately billable procedures and treating other patients.  Critical care was necessary to treat or prevent imminent or life-threatening deterioration.  Critical care was time spent personally by me on the following activities: development of treatment plan with patient and/or  surrogate as well as nursing, discussions with consultants, evaluation of patient's response to treatment, examination of patient, obtaining history from patient or surrogate, ordering and performing treatments and interventions, ordering and review of laboratory studies, ordering and review of radiographic studies, pulse oximetry and re-evaluation of patient's condition.   DIFFERENTIAL DIAGNOSIS   Dehydration, electrolyte abnormality, DKA, renal insufficiency, occult infection  FINAL ASSESSMENT AND PLAN  DKA   Plan: The patient had presented for hyperglycemia as well as vomiting and diarrhea today. Patient's labs did reveal likely mild DKA with a bicarb of 13 and a gap of 26.  I will likely start an insulin drip on her as well as D5 half-normal saline.  We will discuss with the hospitalist for admission.   Ulice Dash, MD   Note: This note was generated in part or whole with voice recognition software. Voice recognition is usually quite accurate but there are transcription errors that can and very often do occur. I apologize for any typographical errors that were not detected and corrected.     Emily Filbert,  MD 08/07/18 2214

## 2018-08-07 NOTE — ED Triage Notes (Signed)
Pt arrives via [facility] with c/o N/V/D today. Pt is type one diabetic with CBG of 427 with EMS. EMS reports all other VS WDL. Pt reports taking 32 units total at home of insulin. Pt is in NAD at this time.

## 2018-08-07 NOTE — H&P (Signed)
[facility] - [place] at [facility]   PATIENT NAME: Kristine Allison    MR#:  [id number]  DATE OF BIRTH:  04-14-1981  DATE OF ADMISSION:  08/07/2018  PRIMARY CARE PHYSICIAN: Patient, No Pcp Per   REQUESTING/REFERRING PHYSICIAN: Mayford Knife, MD  CHIEF COMPLAINT:   Chief Complaint  Patient presents with  . Hyperglycemia    HISTORY OF PRESENT ILLNESS:  Kristine Allison  is a 37 y.o. female who presents with chief complaint as above.  Patient presents to the ED with elevated blood glucose throughout the day today.  She has a history of frequent admissions for DKA, and states that she felt like she is developing DKA again today.  Recently she has not had to be admitted for DKA, but she was having symptoms of dehydration and felt like she need to be evaluated.  She was found to be in DKA here in the ED.  She denies any recent infectious etiology, and endorses taking her medication as prescribed.  Hospitalist were called for admission  PAST MEDICAL HISTORY:   Past Medical History:  Diagnosis Date  . Anxiety   . Asthma   . Diabetes mellitus without complication (HCC)   . Headache(784.0)      PAST SURGICAL HISTORY:   Past Surgical History:  Procedure Laterality Date  . NO PAST SURGERIES       SOCIAL HISTORY:   Social History   Tobacco Use  . Smoking status: Current Every Day Smoker    Packs/day: 0.25    Years: 20.00    Pack years: 5.00    Types: Cigarettes  . Smokeless tobacco: Never Used  Substance Use Topics  . Alcohol use: No    Alcohol/week: 0.0 standard drinks     FAMILY HISTORY:   Family History  Problem Relation Age of Onset  . Heart disease Unknown        No family history    Family history reviewed and is noncontributory DRUG ALLERGIES:   Allergies  Allergen Reactions  . Latex Rash    MEDICATIONS AT HOME:   Prior to Admission medications   Medication Sig Start Date End Date Taking? Authorizing Provider   albuterol (PROVENTIL HFA) 108 (90 Base) MCG/ACT inhaler Inhale 2 puffs into the lungs every 4 (four) hours as needed for wheezing or shortness of breath. 06/11/18   Sharman Cheek, MD  aspirin EC 81 MG EC tablet Take 1 tablet (81 mg total) by mouth daily. Patient not taking: Reported on 03/24/2018 11/20/13   Nani Ravens, MD  azithromycin (ZITHROMAX Z-PAK) 250 MG tablet Take 2 tablets (500 mg) on  Day 1,  followed by 1 tablet (250 mg) once daily on Days 2 through 5. 06/11/18   Sharman Cheek, MD  benzonatate (TESSALON PERLES) 100 MG capsule Take 1 capsule (100 mg total) by mouth 3 (three) times daily as needed for cough. 06/11/18 06/11/19  Sharman Cheek, MD  insulin NPH-regular Human (NOVOLIN 70/30) (70-30) 100 UNIT/ML injection 20 units subcu twice daily 03/26/18   Katha Hamming, MD  naproxen (NAPROSYN) 500 MG tablet Take 1 tablet (500 mg total) by mouth 2 (two) times daily with a meal. 06/11/18   Sharman Cheek, MD  ondansetron (ZOFRAN ODT) 4 MG disintegrating tablet Take 1 tablet (4 mg total) by mouth every 8 (eight) hours as needed for nausea or vomiting. 06/11/18   Sharman Cheek, MD  promethazine (PHENERGAN) 12.5 MG tablet Take 1 tablet (12.5 mg total) by mouth every 6 (six)  hours as needed for nausea or vomiting. Patient not taking: Reported on 03/24/2018 01/31/18   Willy Eddy, MD    REVIEW OF SYSTEMS:  Review of Systems  Constitutional: Positive for malaise/fatigue. Negative for chills, fever and weight loss.  HENT: Negative for ear pain, hearing loss and tinnitus.   Eyes: Negative for blurred vision, double vision, pain and redness.  Respiratory: Negative for cough, hemoptysis and shortness of breath.   Cardiovascular: Negative for chest pain, palpitations, orthopnea and leg swelling.  Gastrointestinal: Negative for abdominal pain, constipation, diarrhea, nausea and vomiting.  Genitourinary: Negative for dysuria, frequency and hematuria.  Musculoskeletal: Negative  for back pain, joint pain and neck pain.  Skin:       No acne, rash, or lesions  Neurological: Negative for dizziness, tremors, focal weakness and weakness.  Endo/Heme/Allergies: Positive for polydipsia. Does not bruise/bleed easily.  Psychiatric/Behavioral: Negative for depression. The patient is not nervous/anxious and does not have insomnia.      VITAL SIGNS:   Vitals:   08/07/18 2038 08/07/18 2040 08/07/18 2100 08/07/18 2130  BP:  128/88 121/80 115/72  Pulse:  (!) 115 (!) 102 98  Resp:  18 12 19   Temp:  98.2 F (36.8 C)    TempSrc:  Oral    SpO2:  97% 99% 94%  Weight: 54.9 kg     Height: 5\' 4"  (1.626 m)      Wt Readings from Last 3 Encounters:  08/07/18 54.9 kg  07/08/18 54.9 kg  06/11/18 54.9 kg    PHYSICAL EXAMINATION:  Physical Exam  Vitals reviewed. Constitutional: She is oriented to person, place, and time. She appears well-developed and well-nourished. No distress.  HENT:  Head: Normocephalic and atraumatic.  Dry mucous membranes  Eyes: Pupils are equal, round, and reactive to light. Conjunctivae and EOM are normal. No scleral icterus.  Neck: Normal range of motion. Neck supple. No JVD present. No thyromegaly present.  Cardiovascular: Regular rhythm and intact distal pulses. Exam reveals no gallop and no friction rub.  No murmur heard. Borderline tachycardia  Respiratory: Effort normal and breath sounds normal. No respiratory distress. She has no wheezes. She has no rales.  GI: Soft. Bowel sounds are normal. She exhibits no distension. There is no tenderness.  Musculoskeletal: Normal range of motion. She exhibits no edema.  No arthritis, no gout  Lymphadenopathy:    She has no cervical adenopathy.  Neurological: She is alert and oriented to person, place, and time. No cranial nerve deficit.  No dysarthria, no aphasia  Skin: Skin is warm and dry. No rash noted. No erythema.  Psychiatric: She has a normal mood and affect. Her behavior is normal. Judgment and  thought content normal.    LABORATORY PANEL:   CBC Recent Labs  Lab 08/07/18 2046  WBC 10.0  HGB 15.5*  HCT 47.3*  PLT 331   ------------------------------------------------------------------------------------------------------------------  Chemistries  Recent Labs  Lab 08/07/18 2046  NA 138  K 4.9  CL 99  CO2 13*  GLUCOSE 399*  BUN 27*  CREATININE 1.28*  CALCIUM 9.2  AST 16  ALT 20  ALKPHOS 109  BILITOT 1.7*   ------------------------------------------------------------------------------------------------------------------  Cardiac Enzymes Recent Labs  Lab 08/07/18 2046  TROPONINI <0.03   ------------------------------------------------------------------------------------------------------------------  RADIOLOGY:  No results found.  EKG:   Orders placed or performed during the hospital encounter of 03/24/18  . ED EKG  . ED EKG  . EKG 12-Lead  . EKG 12-Lead  . EKG    IMPRESSION AND PLAN:  Principal Problem:   Diabetic ketoacidosis without coma associated with type 1 diabetes mellitus (HCC) -insulin drip, admit to stepdown with other supportive treatment including IV fluids and electrolyte management. Active Problems:   GERD (gastroesophageal reflux disease) -Home dose PPI   Anxiety -home dose anxiolytic  Chart review performed and case discussed with ED provider. Labs, imaging and/or ECG reviewed by provider and discussed with patient/family. Management plans discussed with the patient and/or family.  DVT PROPHYLAXIS: SubQ lovenox   GI PROPHYLAXIS:  PPI  ADMISSION STATUS: Inpatient     CODE STATUS: DNR Code Status History    Date Active Date Inactive Code Status Order ID Comments User Context   03/25/2018 1136 03/26/2018 1708 Full Code [id number]  Belva BertinHodge, Ellen W, RN Inpatient   03/22/2017 2140 03/24/2017 1842 Full Code [id number]  Enid BaasKalisetti, Radhika, MD Inpatient   10/02/2015 1355 10/03/2015 1830 Full Code [id number]  Altamese DillingVachhani, Vaibhavkumar, MD  Inpatient   09/25/2015 0323 09/25/2015 2020 Full Code [id number]  Myra RudeSchmitz, Jeremy E, MD ED   07/25/2015 1733 07/27/2015 1525 Full Code [id number]  Zannie CoveJoseph, Preetha, MD Inpatient   05/03/2015 1522 05/05/2015 1634 Full Code [id number]  Onnie BoerEmokpae, Ejiroghene E, MD ED   02/09/2015 2256 02/11/2015 1641 Full Code [id number]  Raliegh IpGottschalk, Ashly M, DO Inpatient   12/21/2014 0843 12/23/2014 2053 Full Code [id number]  Bernadene PersonWhiteheart, Kathryn A, NP ED   08/20/2014 0543 08/22/2014 1903 Full Code [id number]  Oralia ManisWillis, Eternity Dexter, MD Inpatient   12/02/2013 0210 12/03/2013 1700 Full Code [id number]  Leona Singletonhekkekandam, Maria T, MD Inpatient   11/15/2013 1532 11/20/2013 1631 Full Code [id number]  Jeanella Crazellis, Brandi L, NP ED   07/26/2013 2259 07/29/2013 1830 Full Code [id number]  Charm RingsHonig, Erin J, MD Inpatient   06/05/2013 2338 06/07/2013 1822 Full Code [id number]  Glori LuisSonnenberg, Eric G, MD Inpatient   06/05/2013 2338 06/05/2013 2338 Full Code [id number]  Glori LuisSonnenberg, Eric G, MD Inpatient      TOTAL TIME TAKING CARE OF THIS PATIENT: 45 minutes.   Lindsie Simar FIELDING 08/07/2018, 10:37 PM  [facility]  Office  [phone]  CC: Primary care physician; Patient, No Pcp Per  Note:  This document was prepared using Dragon voice recognition software and may include unintentional dictation errors.

## 2018-08-08 DIAGNOSIS — E1010 Type 1 diabetes mellitus with ketoacidosis without coma: Principal | ICD-10-CM

## 2018-08-08 LAB — BASIC METABOLIC PANEL
ANION GAP: 15 (ref 5–15)
ANION GAP: 7 (ref 5–15)
ANION GAP: 8 (ref 5–15)
Anion gap: 7 (ref 5–15)
Anion gap: 8 (ref 5–15)
BUN: 15 mg/dL (ref 6–20)
BUN: 15 mg/dL (ref 6–20)
BUN: 16 mg/dL (ref 6–20)
BUN: 19 mg/dL (ref 6–20)
BUN: 22 mg/dL — ABNORMAL HIGH (ref 6–20)
CALCIUM: 8.4 mg/dL — AB (ref 8.9–10.3)
CHLORIDE: 106 mmol/L (ref 98–111)
CHLORIDE: 103 mmol/L (ref 98–111)
CHLORIDE: 103 mmol/L (ref 98–111)
CHLORIDE: 103 mmol/L (ref 98–111)
CO2: 16 mmol/L — AB (ref 22–32)
CO2: 22 mmol/L (ref 22–32)
CO2: 23 mmol/L (ref 22–32)
CO2: 25 mmol/L (ref 22–32)
CO2: 25 mmol/L (ref 22–32)
CREATININE: 0.71 mg/dL (ref 0.44–1.00)
Calcium: 8.3 mg/dL — ABNORMAL LOW (ref 8.9–10.3)
Calcium: 8.4 mg/dL — ABNORMAL LOW (ref 8.9–10.3)
Calcium: 8.5 mg/dL — ABNORMAL LOW (ref 8.9–10.3)
Calcium: 8.7 mg/dL — ABNORMAL LOW (ref 8.9–10.3)
Chloride: 104 mmol/L (ref 98–111)
Creatinine, Ser: 0.69 mg/dL (ref 0.44–1.00)
Creatinine, Ser: 0.72 mg/dL (ref 0.44–1.00)
Creatinine, Ser: 0.76 mg/dL (ref 0.44–1.00)
Creatinine, Ser: 0.94 mg/dL (ref 0.44–1.00)
GFR calc Af Amer: >60 mL/min (ref >60)
GFR calc non Af Amer: >60 mL/min (ref >60)
GFR calc non Af Amer: >60 mL/min (ref >60)
GFR calc non Af Amer: >60 mL/min (ref >60)
GFR calc non Af Amer: >60 mL/min (ref >60)
GFR calc non Af Amer: >60 mL/min (ref >60)
GLUCOSE: 159 mg/dL — AB (ref 70–99)
Glucose, Bld: 165 mg/dL — ABNORMAL HIGH (ref 70–99)
Glucose, Bld: 242 mg/dL — ABNORMAL HIGH (ref 70–99)
Glucose, Bld: 77 mg/dL (ref 70–99)
Glucose, Bld: 90 mg/dL (ref 70–99)
POTASSIUM: 3.8 mmol/L (ref 3.5–5.1)
POTASSIUM: 4.6 mmol/L (ref 3.5–5.1)
POTASSIUM: 4.7 mmol/L (ref 3.5–5.1)
Potassium: 4 mmol/L (ref 3.5–5.1)
Potassium: 3.8 mmol/L (ref 3.5–5.1)
SODIUM: 135 mmol/L (ref 135–145)
SODIUM: 136 mmol/L (ref 135–145)
SODIUM: 134 mmol/L — AB (ref 135–145)
Sodium: 135 mmol/L (ref 135–145)
Sodium: 135 mmol/L (ref 135–145)

## 2018-08-08 LAB — GLUCOSE, CAPILLARY
GLUCOSE-CAPILLARY: 167 mg/dL — AB (ref 70–99)
GLUCOSE-CAPILLARY: 151 mg/dL — AB (ref 70–99)
GLUCOSE-CAPILLARY: 110 mg/dL — AB (ref 70–99)
GLUCOSE-CAPILLARY: 108 mg/dL — AB (ref 70–99)
GLUCOSE-CAPILLARY: 60 mg/dL — AB (ref 70–99)
GLUCOSE-CAPILLARY: 209 mg/dL — AB (ref 70–99)
GLUCOSE-CAPILLARY: 178 mg/dL — AB (ref 70–99)
GLUCOSE-CAPILLARY: 57 mg/dL — AB (ref 70–99)
GLUCOSE-CAPILLARY: 165 mg/dL — AB (ref 70–99)
Glucose-Capillary: 105 mg/dL — ABNORMAL HIGH (ref 70–99)
Glucose-Capillary: 138 mg/dL — ABNORMAL HIGH (ref 70–99)
Glucose-Capillary: 153 mg/dL — ABNORMAL HIGH (ref 70–99)
Glucose-Capillary: 180 mg/dL — ABNORMAL HIGH (ref 70–99)
Glucose-Capillary: 200 mg/dL — ABNORMAL HIGH (ref 70–99)
Glucose-Capillary: 38 mg/dL — CL (ref 70–99)
Glucose-Capillary: 60 mg/dL — ABNORMAL LOW (ref 70–99)
Glucose-Capillary: 79 mg/dL (ref 70–99)
Glucose-Capillary: 98 mg/dL (ref 70–99)

## 2018-08-08 LAB — CBC
HCT: 39.5 % (ref 36.0–46.0)
HEMOGLOBIN: 13.5 g/dL (ref 12.0–15.0)
MCH: 31.1 pg (ref 26.0–34.0)
MCHC: 34.2 g/dL (ref 30.0–36.0)
MCV: 91 fL (ref 80.0–100.0)
Platelets: 290 10*3/uL (ref 150–400)
RBC: 4.34 MIL/uL (ref 3.87–5.11)
RDW: 13 % (ref 11.5–15.5)
WBC: 9.3 10*3/uL (ref 4.0–10.5)
nRBC: 0 % (ref 0.0–0.2)

## 2018-08-08 LAB — MRSA PCR SCREENING: MRSA by PCR: NEGATIVE

## 2018-08-08 MED ORDER — INSULIN ASPART 100 UNIT/ML ~~LOC~~ SOLN: 0.0000 [IU] | Freq: Three times a day (TID) | SUBCUTANEOUS | Status: DC

## 2018-08-08 MED ORDER — ONDANSETRON HCL 4 MG/2ML IJ SOLN
4.0000 mg | Freq: Four times a day (QID) | INTRAMUSCULAR | Status: DC | PRN
  Administered 2018-08-08: 4 mg via INTRAVENOUS
  Filled 2018-08-08: qty 2

## 2018-08-08 MED ORDER — DEXTROSE 50 % IV SOLN
INTRAVENOUS | Status: AC
  Administered 2018-08-08: 25 mL via INTRAVENOUS
  Filled 2018-08-08: qty 50

## 2018-08-08 MED ORDER — INSULIN DETEMIR 100 UNIT/ML ~~LOC~~ SOLN
10.0000 [IU] | SUBCUTANEOUS | Status: DC
  Administered 2018-08-08 – 2018-08-09 (×2): 10 [IU] via SUBCUTANEOUS
  Filled 2018-08-08 (×4): qty 0.1

## 2018-08-08 MED ORDER — INFLUENZA VAC SPLIT QUAD 0.5 ML IM SUSY
0.5000 mL | PREFILLED_SYRINGE | INTRAMUSCULAR | Status: AC
  Administered 2018-08-09: 09:00:00 0.5 mL via INTRAMUSCULAR
  Filled 2018-08-08: qty 0.5

## 2018-08-08 MED ORDER — ONDANSETRON HCL 4 MG/2ML IJ SOLN: 4.0000 mg | Freq: Four times a day (QID) | INTRAMUSCULAR | Status: DC

## 2018-08-08 MED ORDER — SODIUM CHLORIDE 0.9 % IV SOLN: INTRAVENOUS | Status: DC

## 2018-08-08 MED ORDER — ACETAMINOPHEN 325 MG PO TABS
650.0000 mg | ORAL_TABLET | Freq: Four times a day (QID) | ORAL | Status: DC | PRN
  Administered 2018-08-08 (×2): 650 mg via ORAL
  Filled 2018-08-08 (×2): qty 2

## 2018-08-08 NOTE — Consult Note (Signed)
PULMONARY / CRITICAL CARE MEDICINE  Name: Kristine ElliotMargie E Simmons Cohn MRN: [id number] DOB: 1981-05-14    LOS: 1  Referring Provider: Dr. Anne HahnWillis Reason for Referral: DKA  HPI: 37 year old female with known type 1 diabetes who presented to the ED with nausea vomiting and found to be in DKA.  She is currently on an insulin drip. Past Medical History:  Diagnosis Date  . Anxiety   . Asthma   . Diabetes mellitus without complication (HCC)   . NWGNFAOZ(308.6Headache(784.0)    Past Surgical History:  Procedure Laterality Date  . NO PAST SURGERIES     Prior to Admission medications   Medication Sig Start Date End Date Taking? Authorizing Provider  amLODipine (NORVASC) 5 MG tablet Take 5 mg by mouth daily.   Yes [provider]  clopidogrel (PLAVIX) 75 MG tablet Take 75 mg by mouth daily.   Yes [provider]  donepezil (ARICEPT) 5 MG tablet Take 1 tablet (5 mg total) by mouth at bedtime. 05/17/18 06/26/18 Yes Sowles, Danna HeftyKrichna, MD  empagliflozin (JARDIANCE) 25 MG TABS tablet Take 25 mg by mouth daily.   Yes [provider]  glycopyrrolate (ROBINUL) 1 MG tablet Take 1 mg by mouth 2 (two) times daily.   Yes [provider]  insulin aspart (NOVOLOG FLEXPEN) 100 UNIT/ML FlexPen Inject 12 Units into the skin 2 (two) times daily.   Yes [provider]  insulin aspart (NOVOLOG) 100 UNIT/ML FlexPen Inject 18 Units into the skin daily. At 1700   Yes [provider]  Insulin Degludec-Liraglutide (XULTOPHY) 100-3.6 UNIT-MG/ML SOPN Inject 50 Units into the skin daily.   Yes [provider]  levETIRAcetam (KEPPRA) 500 MG tablet Take 500 mg by mouth 2 (two) times daily.   Yes [provider]  lipase/protease/amylase (CREON) 12000 units CPEP capsule Take 6,000 Units by mouth 3 (three) times daily before meals.   Yes [provider]  lipase/protease/amylase (CREON) 12000 units CPEP capsule Take 3,000 Units by mouth at bedtime. With snack   Yes  [provider]  lisinopril (PRINIVIL,ZESTRIL) 5 MG tablet Take 5 mg by mouth daily.   Yes [provider]  metoprolol succinate (TOPROL-XL) 25 MG 24 hr tablet Take 1 tablet (25 mg total) by mouth daily. 05/17/18  Yes Sowles, Danna HeftyKrichna, MD  rosuvastatin (CRESTOR) 40 MG tablet Take 1 tablet (40 mg total) by mouth daily. 05/17/18 06/26/18 Yes Alba CorySowles, Krichna, MD  aspirin EC 81 MG tablet Take 81 mg by mouth daily.    [provider]  famotidine (PEPCID) 20 MG tablet Take 1 tablet (20 mg total) by mouth 2 (two) times daily. 05/17/18 06/16/18  Alba CorySowles, Krichna, MD  gabapentin (NEURONTIN) 300 MG capsule Take 1 capsule (300 mg total) by mouth 2 (two) times daily. 05/17/18 06/16/18  Alba CorySowles, Krichna, MD  insulin glargine (LANTUS) 100 UNIT/ML injection Inject 0.1 mLs (10 Units total) into the skin daily. 05/17/18 06/16/18  Alba CorySowles, Krichna, MD  lacosamide 100 MG TABS Take 1 tablet (100 mg total) by mouth 2 (two) times daily. Patient not taking: Reported on 06/26/2018 10/02/17   Enedina FinnerPatel, Sona, MD  promethazine (PHENERGAN) 12.5 MG tablet Take 1 tablet (12.5 mg total) by mouth every 6 (six) hours as needed for nausea or vomiting. Patient not taking: Reported on 06/26/2018 07/21/17   Almond LintByerly, Faera, MD  sertraline (ZOLOFT) 25 MG tablet Take 1 tablet (25 mg total) by mouth daily. Patient not taking: Reported on 06/26/2018 05/17/18   Alba CorySowles, Krichna, MD   Allergies Allergies  Allergen Reactions  . Latex Rash    Family History Family History  Problem Relation Age of Onset  . Heart disease Unknown        No family history   Social History  reports that she has been smoking cigarettes. She has a 5.00 pack-year smoking history. She has never used smokeless tobacco. She reports that she does not drink alcohol or use drugs.  Review Of Systems: All systems reviewed.  Pertinent positives include abdominal cramping, nausea, vomiting, and generalized weakness.  All other systems are negative  VITAL  SIGNS: Dry BP 106/63 (BP Location: Right Arm)   Pulse 91   Temp 98.8 F (37.1 C) (Oral)   Resp 18   Ht 5\' 4"  (1.626 m)   Wt 46.5 kg   LMP 10/23/2013   SpO2 90%   BMI 17.60 kg/m   HEMODYNAMICS:    VENTILATOR SETTINGS:    INTAKE / OUTPUT: No intake/output data recorded.  PHYSICAL EXAMINATION: General: Acutely ill looking HEENT: PERRLA, trachea midline, no JVD Neuro: Alert and oriented x4, cranial nerves intact Cardiovascular: RRR, S1-S2, no murmur regurg or gallop, +2 pulses Lungs: Clear to auscultation bilaterally Abdomen: Normal bowel sounds, mild pain with gentle palpation Musculoskeletal: Positive range of motion Skin: Warm and dry  LABS:  BMET Recent Labs  Lab 08/07/18 2046 08/07/18 2351 08/08/18 0328  NA 138 135 135  K 4.9 4.7 4.6  CL 99 104 106  CO2 13* 16* 22  BUN 27* 22* 19  CREATININE 1.28* 0.94 0.76  GLUCOSE 399* 242* 165*    Electrolytes Recent Labs  Lab 08/07/18 2046 08/07/18 2351 08/08/18 0328  CALCIUM 9.2 8.3* 8.4*    CBC Recent Labs  Lab 08/07/18 2046 08/07/18 2351  WBC 10.0 9.3  HGB 15.5* 13.5  HCT 47.3* 39.5  PLT 331 290    Coag's No results for input(s): APTT, INR in the last 168 hours.  Sepsis Markers No results for input(s): LATICACIDVEN, PROCALCITON, O2SATVEN in the last 168 hours.  ABG No results for input(s): PHART, PCO2ART, PO2ART in the last 168 hours.  Liver Enzymes Recent Labs  Lab 08/07/18 2046  AST 16  ALT 20  ALKPHOS 109  BILITOT 1.7*  ALBUMIN 4.8    Cardiac Enzymes Recent Labs  Lab 08/07/18 2046  TROPONINI <0.03    Glucose Recent Labs  Lab 08/07/18 2331 08/08/18 0042 08/08/18 0149 08/08/18 0253 08/08/18 0401 08/08/18 0506  GLUCAP 257* 200* 167* 151* 153* 110*    Imaging No results found.  ASSESSMENT DKA Uncontrolled type 1 diabetes Nausea and vomiting  PLAN DKA protocol Antiemetics Monitor and correct electrolytes  Best Practice: Code Status: Full code Diet: Carb  modified GI prophylaxis: Not indicated VTE prophylaxis: SCDs  FAMILY  - Updates: No family at bedside.  Will update when available  Tobin Cadiente S. [facility] ANP-BC Pulmonary and Critical Care Medicine [facility] Pager [phone] or [phone]  NB: This document was prepared using Dragon voice recognition software and may include unintentional dictation errors.    08/08/2018, 6:08 AM

## 2018-08-08 NOTE — Progress Notes (Signed)
Pt complains of chronic back pain.  Tylenol given with some relief.  When asked what helps with pain at home she stated usually heating pad.  Offered to give a warm blanket to put behind pt but she declined.

## 2018-08-08 NOTE — Progress Notes (Signed)
[facility] -  at [facility]   PATIENT NAME: Kristine Allison    MR#:  [id number]  DATE OF BIRTH:  1981-07-27  SUBJECTIVE:  CHIEF COMPLAINT:   Chief Complaint  Patient presents with  . Hyperglycemia  Patient seen and evaluated today Decreased nausea and vomiting Generalized weakness No chest pain  REVIEW OF SYSTEMS:    ROS  CONSTITUTIONAL: No documented fever. Has fatigue, weakness. No weight gain, no weight loss.  EYES: No blurry or double vision.  ENT: No tinnitus. No postnasal drip. No redness of the oropharynx.  RESPIRATORY: No cough, no wheeze, no hemoptysis. No dyspnea.  CARDIOVASCULAR: No chest pain. No orthopnea. No palpitations. No syncope.  GASTROINTESTINAL: No nausea, no vomiting or diarrhea. No abdominal pain. No melena or hematochezia.  GENITOURINARY: No dysuria or hematuria.  ENDOCRINE: No polyuria or nocturia. No heat or cold intolerance.  HEMATOLOGY: No anemia. No bruising. No bleeding.  INTEGUMENTARY: No rashes. No lesions.  MUSCULOSKELETAL: No arthritis. No swelling. No gout.  NEUROLOGIC: No numbness, tingling, or ataxia. No seizure-type activity.  PSYCHIATRIC: No anxiety. No insomnia. No ADD.   DRUG ALLERGIES:   Allergies  Allergen Reactions  . Latex Rash    VITALS:  Blood pressure (!) 104/57, pulse 84, temperature 98.3 F (36.8 C), temperature source Axillary, resp. rate 15, height 5\' 4"  (1.626 m), weight 46.5 kg, last menstrual period 10/23/2013, SpO2 96 %.  PHYSICAL EXAMINATION:   Physical Exam  GENERAL:  37 y.o.-year-old patient lying in the bed with no acute distress.  EYES: Pupils equal, round, reactive to light and accommodation. No scleral icterus. Extraocular muscles intact.  HEENT: Head atraumatic, normocephalic. Oropharynx dry and nasopharynx clear.  NECK:  Supple, no jugular venous distention. No thyroid enlargement, no tenderness.  LUNGS: Normal breath sounds bilaterally, no wheezing, rales, rhonchi. No  use of accessory muscles of respiration.  CARDIOVASCULAR: S1, S2 normal. No murmurs, rubs, or gallops.  ABDOMEN: Soft, nontender, nondistended. Bowel sounds present. No organomegaly or mass.  EXTREMITIES: No cyanosis, clubbing or edema b/l.    NEUROLOGIC: Cranial nerves II through XII are intact. No focal Motor or sensory deficits b/l.   PSYCHIATRIC: The patient is alert and oriented x 3.  SKIN: No obvious rash, lesion, or ulcer.   LABORATORY PANEL:   CBC Recent Labs  Lab 08/07/18 2351  WBC 9.3  HGB 13.5  HCT 39.5  PLT 290   ------------------------------------------------------------------------------------------------------------------ Chemistries  Recent Labs  Lab 08/07/18 2046  08/08/18 1049  NA 138   < > 134*  K 4.9   < > 3.8  CL 99   < > 103  CO2 13*   < > 23  GLUCOSE 399*   < > 90  BUN 27*   < > 15  CREATININE 1.28*   < > 0.71  CALCIUM 9.2   < > 8.4*  AST 16  --   --   ALT 20  --   --   ALKPHOS 109  --   --   BILITOT 1.7*  --   --    < > = values in this interval not displayed.   ------------------------------------------------------------------------------------------------------------------  Cardiac Enzymes Recent Labs  Lab 08/07/18 2046  TROPONINI <0.03   ------------------------------------------------------------------------------------------------------------------  RADIOLOGY:  No results found.   ASSESSMENT AND PLAN:  37 year old female patient with history of diabetes mellitus insulin-dependent, bronchial asthma, anxiety disorder currently in stepdown unit for diabetic ketoacidosis  -Diabetic ketoacidosis Resolving IV insulin drip stopped Transition to subcu insulin,  diabetic diet IV fluids  -Transferred to medical floor once stable today  -GERD Continue PPI  -Anxiety disorder Continue anxiolytics  -DVT prophylaxis subcu Lovenox daily   All the records are reviewed and case discussed with Care Management/Social  Worker. Management plans discussed with the patient, family and they are in agreement.  CODE STATUS: Full code  DVT Prophylaxis: SCDs  TOTAL TIME TAKING CARE OF THIS PATIENT: 35 minutes.   POSSIBLE D/C IN 1 DAYS, DEPENDING ON CLINICAL CONDITION.  Ihor Austin M.D on 08/08/2018 at 11:22 AM  Between 7am to 6pm - Pager - [phone]  After 6pm go to [email or web address] - password [email or web address]  [facility] [place] [facility]  Office  [phone]  CC: Primary care physician; Patient, No Pcp Per  Note: This dictation was prepared with Dragon dictation along with smaller phrase technology. Any transcriptional errors that result from this process are unintentional.

## 2018-08-09 LAB — GLUCOSE, CAPILLARY
GLUCOSE-CAPILLARY: 202 mg/dL — AB (ref 70–99)
GLUCOSE-CAPILLARY: 147 mg/dL — AB (ref 70–99)
GLUCOSE-CAPILLARY: 138 mg/dL — AB (ref 70–99)
Glucose-Capillary: 174 mg/dL — ABNORMAL HIGH (ref 70–99)
Glucose-Capillary: 45 mg/dL — ABNORMAL LOW (ref 70–99)
Glucose-Capillary: 47 mg/dL — ABNORMAL LOW (ref 70–99)
Glucose-Capillary: 187 mg/dL — ABNORMAL HIGH (ref 70–99)
Glucose-Capillary: 218 mg/dL — ABNORMAL HIGH (ref 70–99)
Glucose-Capillary: 241 mg/dL — ABNORMAL HIGH (ref 70–99)

## 2018-08-09 LAB — BASIC METABOLIC PANEL
Anion gap: 5 (ref 5–15)
BUN: 14 mg/dL (ref 6–20)
CALCIUM: 8.3 mg/dL — AB (ref 8.9–10.3)
CO2: 28 mmol/L (ref 22–32)
CREATININE: 0.51 mg/dL (ref 0.44–1.00)
Chloride: 101 mmol/L (ref 98–111)
GFR calc non Af Amer: >60 mL/min (ref >60)
GLUCOSE: 215 mg/dL — AB (ref 70–99)
Potassium: 3.9 mmol/L (ref 3.5–5.1)
Sodium: 134 mmol/L — ABNORMAL LOW (ref 135–145)

## 2018-08-09 LAB — HIV ANTIBODY (ROUTINE TESTING W REFLEX): HIV Screen 4th Generation wRfx: NONREACTIVE

## 2018-08-09 MED ORDER — INSULIN ASPART 100 UNIT/ML ~~LOC~~ SOLN
0.0000 [IU] | SUBCUTANEOUS | Status: DC
  Administered 2018-08-09: 3 [IU] via SUBCUTANEOUS
  Filled 2018-08-09: qty 1

## 2018-08-09 NOTE — Progress Notes (Signed)
At 01:38, pt's blood sugar dropped to 47.  Repeat test resulted with a blood sugar of 45.  Pt stated that she did not "feel different".  Orange juice x2 was given as well as peanut butter graham crackers.  Blood sugar recheck is now at 174.

## 2018-08-09 NOTE — Care Management Note (Signed)
Case Management Note  Patient Details  Name: Kristine Allison MRN: [id number] Date of Birth: [birth year]  Subjective/Objective:   Admitted to [facility] with the diagnosis of Diabetic Ketoacidosis. Lives with parent. States she hasn't seen a physician in 2 years. States she does  her prescriptions filled at [facility]. [place]. 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 Takes care of all basic activities of daily living herself, drives. No falls Good appetite.  No medical equipment in the home. Telephone # [phone]                  Action/Plan: Discussed [facility] and Medication Management application (One was given 03/26/18).  Another application given. Will send referral to Smiley HousemanLorrie Carter at [facility].   Expected Discharge Date:                  Expected Discharge Plan:     In-House Referral:   yes  Discharge planning Services   yes  Post Acute Care Choice:    Choice offered to:     DME Arranged:    DME Agency:     HH Arranged:    HH Agency:     Status of Service:     If discussed at [employer] Length of Stay Meetings, dates discussed:    Additional Comments:  Gwenette GreetBrenda S Jeramyah Goodpasture, RN MSN CCM Care Management [phone] 08/09/2018, 9:42 AM

## 2018-08-09 NOTE — Progress Notes (Addendum)
Inpatient Diabetes Program Recommendations  AACE/ADA: New Consensus Statement on Inpatient Glycemic Control (2015)  Target Ranges:  Prepandial:   less than 140 mg/dL      Peak postprandial:   less than 180 mg/dL (1-2 hours)      Critically ill patients:  140 - 180 mg/dL   Results for VEANNA, DOWER (MRN [id number]) as of 08/09/2018 06:51  Ref. Range 08/08/2018 08:45 08/08/2018 09:49 08/08/2018 11:41 08/08/2018 12:13 08/08/2018 14:12 08/08/2018 16:10 08/08/2018 19:54 08/08/2018 20:33 08/08/2018 21:55  Glucose-Capillary Latest Ref Range: 70 - 99 mg/dL 180 (H)   10 units LEVEMIR given at 6:45am 138 (H) 38 (LL) 209 (H) 178 (H) 98 57 (L) 165 (H) 105 (H)   Results for KAYLIEGH, BOYERS (MRN [id number]) as of 08/09/2018 06:51  Ref. Range 08/09/2018 01:38 08/09/2018 01:40 08/09/2018 02:12 08/09/2018 04:06 08/09/2018 04:45 08/09/2018 06:10  Glucose-Capillary Latest Ref Range: 70 - 99 mg/dL 47 (L) 45 (L) 174 (H) 187 (H) 218 (H) 202 (H)  10 units LEVEMIR   Results for ANALLELY, ROSELL (MRN [id number]) as of 08/09/2018 06:51  Ref. Range 03/24/2018 13:15  Hemoglobin A1C Latest Ref Range: 4.8 - 5.6 % 12.6 (H)  (314 mg/dl)   Admit with: DKA  History: Type 1 DM  Home DM Meds: 70/30 Insulin: 12-20 units BID per SSI  Current Orders: Levemir 10 units Daily      Novolog Sensitive Correction Scale/ SSI (0-9 units) Q4 hours     DM Coordinator counseled patient on 03/26/2018 during last admission.  Had Hypoglycemia yesterday at 12pm and 8pm.  Hypoglycemic again this AM at 1:30am.  Likely Hypoglycemic from the Levemir as NO Novolog given to patient yesterday.     MD- Please consider the following in-hospital insulin adjustments:  1. Reduce Levemir to 7 units Daily (30% reduction)  2. Change Novolog SSI timing to TID AC + HS (currently ordered Q4H and pt has PO diet orders)  3. Check Current Hemoglobin A1c level--Last one in file was 12.6% back in July   Addendum  11:20am- Met with pt this AM to discuss home glucose control, home insulin regimen, etc.  Pt would barely make eye contact with me and told me she was very tired and that she has not been allowed to sleep for days due to constant interruptions.  Asked patient if she had any questions for me regarding her home DM regimen.  Pt stated she did not have any questions and that she just wanted to sleep.  Still takes 70/30 Insulin BID at home on a sliding scale.  States she has CBG meter at home along with supplies but would not quantify how often she checks.  It seemed as if patient had very little interest in speaking with me.  I asked patient about storage of her insulin and she told me she knows to keep it out of the car and inside the house.  Pt rolled over in bed with her back to me and I politely excused myself from her room.   --Will follow patient during hospitalization--  Wyn Quaker RN, MSN, CDE Diabetes Coordinator Inpatient Glycemic Control Team Team Pager: [phone] (8a-5p)

## 2018-08-09 NOTE — Progress Notes (Signed)
Report called to Bed Bath & BeyondKelly, rn.  Pt being transferred to room 109.  She is alert and oriented, no complaints of pain.  Blood sugar is at 187.

## 2018-08-09 NOTE — Progress Notes (Signed)
NT check blood sugar level: 216. Will check again in an hour per orders.

## 2018-08-09 NOTE — Progress Notes (Signed)
[facility] - [place] at [facility]    Stephaniemarie Jearld AdjutantSimmons Cohn was admitted to the Hospital on 08/07/2018 and Discharged  08/09/2018 and should be excused from work/school  for above  days starting 08/07/2018 , may return to work/school without any restrictions from 08/10/2018.  Call Ihor AustinPavan Emelina Hinch MD with questions.  Ihor AustinPavan Evelio Rueda M.D on 08/09/2018,at 4:36 PM  [facility] - [place] [facility]    Office  [phone]

## 2018-08-09 NOTE — Discharge Summary (Signed)
[facility] - [place] at [facility]   PATIENT NAME: Kristine Allison    MR#:  [id number]  DATE OF BIRTH:  01/16/1981  DATE OF ADMISSION:  08/07/2018 ADMITTING PHYSICIAN: Oralia Manis, MD  DATE OF DISCHARGE: 08/09/2018  PRIMARY CARE PHYSICIAN: Patient, No Pcp Per   ADMISSION DIAGNOSIS:  Diabetic ketoacidosis without coma associated with type 1 diabetes mellitus (HCC) [E10.10] GERD Anxiety disorder DISCHARGE DIAGNOSIS:  Principal Problem:   Diabetic ketoacidosis without coma associated with type 1 diabetes mellitus (HCC) Active Problems:   GERD (gastroesophageal reflux disease)   Anxiety   DKA (diabetic ketoacidoses) (HCC)   SECONDARY DIAGNOSIS:   Past Medical History:  Diagnosis Date  . Anxiety   . Asthma   . Diabetes mellitus without complication (HCC)   . NWGNFAOZ(308.6)      ADMITTING HISTORY Kristine Allison  is a 37 y.o. female who presents with chief complaint as above.  Patient presents to the ED with elevated blood glucose throughout the day today.  She has a history of frequent admissions for DKA, and states that she felt like she is developing DKA again today.  Recently she has not had to be admitted for DKA, but she was having symptoms of dehydration and felt like she need to be evaluated.  She was found to be in DKA here in the ED.  She denies any recent infectious etiology, and endorses taking her medication as prescribed.  Hospitalist were called for admission   HOSPITAL COURSE:  Patient was started on IV insulin drip and IV fluids and admitted to ICU.  Her blood sugars were controlled.  Diabetic ketoacidosis improved and she was weaned off insulin drip.  Patient was transferred to medical floor.  Patient blood sugars are better controlled.  Diabetic teaching given and patient will be discharged home with a appointment with [facility].  Advised compliance with medication and low-carb diet.  CONSULTS OBTAINED:    DRUG ALLERGIES:    Allergies  Allergen Reactions  . Latex Rash    DISCHARGE MEDICATIONS:   Allergies as of 08/09/2018      Reactions   Latex Rash      Medication List    TAKE these medications   insulin NPH-regular Human (70-30) 100 UNIT/ML injection 20 units subcu twice daily What changed:    how much to take  how to take this  when to take this  additional instructions       Today  Patient seen and evaluated today Tolerating diet well No vomitings and nausea No complaints VITAL SIGNS:  Blood pressure 116/73, pulse 60, temperature 97.9 F (36.6 C), temperature source Oral, resp. rate 15, height 5\' 4"  (1.626 m), weight 46.5 kg, last menstrual period 10/23/2013, SpO2 99 %.  I/O:    Intake/Output Summary (Last 24 hours) at 08/09/2018 1101 Last data filed at 08/09/2018 0937 Gross per 24 hour  Intake 120 ml  Output 725 ml  Net -605 ml    PHYSICAL EXAMINATION:  Physical Exam  GENERAL:  37 y.o.-year-old patient lying in the bed with no acute distress.  LUNGS: Normal breath sounds bilaterally, no wheezing, rales,rhonchi or crepitation. No use of accessory muscles of respiration.  CARDIOVASCULAR: S1, S2 normal. No murmurs, rubs, or gallops.  ABDOMEN: Soft, non-tender, non-distended. Bowel sounds present. No organomegaly or mass.  NEUROLOGIC: Moves all 4 extremities. PSYCHIATRIC: The patient is alert and oriented x 3.  SKIN: No obvious rash, lesion, or ulcer.   DATA REVIEW:   CBC  Recent Labs  Lab 08/07/18 2351  WBC 9.3  HGB 13.5  HCT 39.5  PLT 290    Chemistries  Recent Labs  Lab 08/07/18 2046  08/09/18 0354  NA 138   < > 134*  K 4.9   < > 3.9  CL 99   < > 101  CO2 13*   < > 28  GLUCOSE 399*   < > 215*  BUN 27*   < > 14  CREATININE 1.28*   < > 0.51  CALCIUM 9.2   < > 8.3*  AST 16  --   --   ALT 20  --   --   ALKPHOS 109  --   --   BILITOT 1.7*  --   --    < > = values in this interval not displayed.    Cardiac Enzymes Recent Labs  Lab  08/07/18 2046  TROPONINI <0.03    Microbiology Results  Results for orders placed or performed during the hospital encounter of 08/07/18  MRSA PCR Screening     Status: None   Collection Time: 08/07/18 11:32 PM  Result Value Ref Range Status   MRSA by PCR NEGATIVE NEGATIVE Final    Comment:        The GeneXpert MRSA Assay (FDA approved for NASAL specimens only), is one component of a comprehensive MRSA colonization surveillance program. It is not intended to diagnose MRSA infection nor to guide or monitor treatment for MRSA infections. Performed at [facility], [address] [place], [place] [address]     RADIOLOGY:  No results found.  Follow up with PCP in 1 week.  Management plans discussed with the patient, family and they are in agreement.  CODE STATUS: Full code    Code Status Orders  (From admission, onward)         Start     Ordered   08/07/18 2252  Full code  Continuous     08/07/18 2251        Code Status History    Date Active Date Inactive Code Status Order ID Comments User Context   03/25/2018 1136 03/26/2018 1708 Full Code [id number]  Belva BertinHodge, Ellen W, RN Inpatient   03/22/2017 2140 03/24/2017 1842 Full Code [id number]  Enid BaasKalisetti, Radhika, MD Inpatient   10/02/2015 1355 10/03/2015 1830 Full Code [id number]  Altamese DillingVachhani, Vaibhavkumar, MD Inpatient   09/25/2015 0323 09/25/2015 2020 Full Code [id number]  Myra RudeSchmitz, Jeremy E, MD ED   07/25/2015 1733 07/27/2015 1525 Full Code [id number]  Zannie CoveJoseph, Preetha, MD Inpatient   05/03/2015 1522 05/05/2015 1634 Full Code [id number]  Onnie BoerEmokpae, Ejiroghene E, MD ED   02/09/2015 2256 02/11/2015 1641 Full Code [id number]  Raliegh IpGottschalk, Ashly M, DO Inpatient   12/21/2014 0843 12/23/2014 2053 Full Code [id number]  Bernadene PersonWhiteheart, Kathryn A, NP ED   08/20/2014 0543 08/22/2014 1903 Full Code [id number]  Oralia ManisWillis, David, MD Inpatient   12/02/2013 0210 12/03/2013 1700 Full Code [id number]  Leona Singletonhekkekandam, Maria T, MD Inpatient   11/15/2013 1532 11/20/2013 1631 Full  Code [id number]  Jeanella Crazellis, Brandi L, NP ED   07/26/2013 2259 07/29/2013 1830 Full Code [id number]  Charm RingsHonig, Erin J, MD Inpatient   06/05/2013 2338 06/07/2013 1822 Full Code [id number]  Glori LuisSonnenberg, Eric G, MD Inpatient   06/05/2013 2338 06/05/2013 2338 Full Code [id number]  Glori LuisSonnenberg, Eric G, MD Inpatient      TOTAL TIME TAKING CARE OF THIS PATIENT ON DAY OF DISCHARGE: more than 33 minutes.   Ihor AustinPavan Pyreddy M.D on 08/09/2018 at  11:01 AM  Between 7am to 6pm - Pager - [phone]  After 6pm go to [email or web address] - password [email or web address]  [facility] [place] [facility]  Office  [phone]  CC: Primary care physician; Patient, No Pcp Per  Note: This dictation was prepared with Dragon dictation along with smaller phrase technology. Any transcriptional errors that result from this process are unintentional.

## 2018-08-25 ENCOUNTER — Other Ambulatory Visit: Payer: Self-pay

## 2018-08-25 ENCOUNTER — Emergency Department: Payer: Self-pay

## 2018-08-25 ENCOUNTER — Emergency Department
Admission: EM | Admit: 2018-08-25 | Discharge: 2018-08-25 | Disposition: A | Discharge status: Home / Self Care | Payer: Self-pay | Attending: Student in an Organized Health Care Education/Training Program | Admitting: Student in an Organized Health Care Education/Training Program

## 2018-08-25 DIAGNOSIS — F17210 Nicotine dependence, cigarettes, uncomplicated: Secondary | ICD-10-CM | POA: Insufficient documentation

## 2018-08-25 DIAGNOSIS — F1310 Sedative, hypnotic or anxiolytic abuse, uncomplicated: Secondary | ICD-10-CM | POA: Insufficient documentation

## 2018-08-25 DIAGNOSIS — Z794 Long term (current) use of insulin: Secondary | ICD-10-CM | POA: Insufficient documentation

## 2018-08-25 DIAGNOSIS — E1065 Type 1 diabetes mellitus with hyperglycemia: Secondary | ICD-10-CM | POA: Insufficient documentation

## 2018-08-25 DIAGNOSIS — R4182 Altered mental status, unspecified: Secondary | ICD-10-CM

## 2018-08-25 DIAGNOSIS — I252 Old myocardial infarction: Secondary | ICD-10-CM | POA: Insufficient documentation

## 2018-08-25 DIAGNOSIS — J45909 Unspecified asthma, uncomplicated: Secondary | ICD-10-CM | POA: Insufficient documentation

## 2018-08-25 DIAGNOSIS — R739 Hyperglycemia, unspecified: Secondary | ICD-10-CM

## 2018-08-25 DIAGNOSIS — Z91040 Latex allergy status: Secondary | ICD-10-CM | POA: Insufficient documentation

## 2018-08-25 LAB — CBC WITH DIFFERENTIAL/PLATELET
ABS IMMATURE GRANULOCYTES: 0 10*3/uL (ref 0.00–0.07)
BASOS ABS: 0 10*3/uL (ref 0.0–0.1)
Basophils Relative: 1 %
Eosinophils Absolute: 0.1 10*3/uL (ref 0.0–0.5)
Eosinophils Relative: 3 %
HEMATOCRIT: 36.5 % (ref 36.0–46.0)
HEMOGLOBIN: 11.9 g/dL — AB (ref 12.0–15.0)
IMMATURE GRANULOCYTES: 0 %
LYMPHS ABS: 1.2 10*3/uL (ref 0.7–4.0)
LYMPHS PCT: 37 %
MCH: 30.8 pg (ref 26.0–34.0)
MCHC: 32.6 g/dL (ref 30.0–36.0)
MCV: 94.6 fL (ref 80.0–100.0)
Monocytes Absolute: 0.3 10*3/uL (ref 0.1–1.0)
Monocytes Relative: 8 %
NEUTROS ABS: 1.7 10*3/uL (ref 1.7–7.7)
NEUTROS PCT: 51 %
NRBC: 0 % (ref 0.0–0.2)
Platelets: 149 10*3/uL — ABNORMAL LOW (ref 150–400)
RBC: 3.86 MIL/uL — ABNORMAL LOW (ref 3.87–5.11)
RDW: 14.2 % (ref 11.5–15.5)
WBC: 3.3 10*3/uL — ABNORMAL LOW (ref 4.0–10.5)

## 2018-08-25 LAB — BLOOD GAS, VENOUS
ACID-BASE EXCESS: 0.9 mmol/L (ref 0.0–2.0)
BICARBONATE: 27.5 mmol/L (ref 20.0–28.0)
O2 Saturation: 61.1 %
PCO2 VEN: 51 mmHg (ref 44.0–60.0)
PH VEN: 7.34 (ref 7.250–7.430)
Patient temperature: 37
pO2, Ven: 34 mmHg (ref 32.0–45.0)

## 2018-08-25 LAB — GLUCOSE, CAPILLARY
GLUCOSE-CAPILLARY: 165 mg/dL — AB (ref 70–99)
Glucose-Capillary: 527 mg/dL (ref 70–99)
Glucose-Capillary: 147 mg/dL — ABNORMAL HIGH (ref 70–99)

## 2018-08-25 LAB — URINALYSIS, COMPLETE (UACMP) WITH MICROSCOPIC
BACTERIA UA: NONE SEEN
BILIRUBIN URINE: NEGATIVE
Glucose, UA: >=500 mg/dL — AB
Hgb urine dipstick: NEGATIVE
Ketones, ur: NEGATIVE mg/dL
Leukocytes, UA: NEGATIVE
Nitrite: NEGATIVE
Protein, ur: NEGATIVE mg/dL
Specific Gravity, Urine: 1.032 — ABNORMAL HIGH (ref 1.005–1.030)
WBC UA: NONE SEEN WBC/hpf (ref 0–5)
pH: 6 (ref 5.0–8.0)

## 2018-08-25 LAB — COMPREHENSIVE METABOLIC PANEL
ALK PHOS: 58 U/L (ref 38–126)
ALT: 14 U/L (ref 0–44)
AST: 15 U/L (ref 15–41)
Albumin: 3.4 g/dL — ABNORMAL LOW (ref 3.5–5.0)
Anion gap: 5 (ref 5–15)
BILIRUBIN TOTAL: 0.5 mg/dL (ref 0.3–1.2)
BUN: 17 mg/dL (ref 6–20)
CO2: 28 mmol/L (ref 22–32)
Calcium: 7.9 mg/dL — ABNORMAL LOW (ref 8.9–10.3)
Chloride: 101 mmol/L (ref 98–111)
Creatinine, Ser: 0.72 mg/dL (ref 0.44–1.00)
GFR calc Af Amer: >60 mL/min (ref >60)
GFR calc non Af Amer: >60 mL/min (ref >60)
GLUCOSE: 565 mg/dL — AB (ref 70–99)
POTASSIUM: 4.7 mmol/L (ref 3.5–5.1)
Sodium: 134 mmol/L — ABNORMAL LOW (ref 135–145)
TOTAL PROTEIN: 5.7 g/dL — AB (ref 6.5–8.1)

## 2018-08-25 LAB — URINE DRUG SCREEN, QUALITATIVE (ARMC ONLY)
Amphetamines, Ur Screen: NOT DETECTED
Barbiturates, Ur Screen: NOT DETECTED
Benzodiazepine, Ur Scrn: POSITIVE — AB
CANNABINOID 50 NG, UR ~~LOC~~: NOT DETECTED
COCAINE METABOLITE, UR ~~LOC~~: NOT DETECTED
MDMA (ECSTASY) UR SCREEN: NOT DETECTED
Methadone Scn, Ur: NOT DETECTED
OPIATE, UR SCREEN: NOT DETECTED
PHENCYCLIDINE (PCP) UR S: NOT DETECTED
Tricyclic, Ur Screen: NOT DETECTED

## 2018-08-25 LAB — POCT PREGNANCY, URINE: PREG TEST UR: NEGATIVE

## 2018-08-25 MED ORDER — SODIUM CHLORIDE 0.9 % IV BOLUS
1000.0000 mL | Freq: Once | INTRAVENOUS | Status: AC
  Administered 2018-08-25: 1000 mL via INTRAVENOUS

## 2018-08-25 MED ORDER — INSULIN ASPART 100 UNIT/ML ~~LOC~~ SOLN
10.0000 [IU] | Freq: Once | SUBCUTANEOUS | Status: AC
  Administered 2018-08-25: 10 [IU] via INTRAVENOUS
  Filled 2018-08-25: qty 1

## 2018-08-25 MED ORDER — HALOPERIDOL LACTATE 5 MG/ML IJ SOLN
5.0000 mg | Freq: Once | INTRAMUSCULAR | Status: AC
  Administered 2018-08-25: 5 mg via INTRAVENOUS
  Filled 2018-08-25: qty 1

## 2018-08-25 NOTE — ED Notes (Signed)
RT called to be informed VBG is being sent to lab

## 2018-08-25 NOTE — ED Notes (Signed)
Pt walks out of room 10 with only her shirt on, no pants, saying she needs to go to the bathroom.  Redirected pt back to her room.  She went to the bathroom and was settled back in bed.  Instructed pt to use her call button to call for assistance.  Pt reluctantly verbalized an understanding.

## 2018-08-25 NOTE — Discharge Instructions (Addendum)
Please follow-up with her doctor in the next day or 2 return here for any further problems.  Try to avoid any further unauthorized medications.  If she does not have a doctor you can try follow-[facility] with [facility] which is walk-in or [facility] to [facility] they have a charity care section there with they will see state residents here we have [facility], [facility], the [facility] in the [facility] and [facility].  If there are any problems you can get see Dr. in the next couple days please return here for recheck.

## 2018-08-25 NOTE — ED Provider Notes (Signed)
Patient is sleepy but arousable now CT looks like a possible stroke but MRI shows no stroke patient's family reports she seems to want to take Xanax that is not hers.  Her drug screen is positive for benzodiazepines.  Patient is more awake and alert now was up and walking around went to the bathroom by herself.  She was a little bit unsteady on her feet at the time her glucose is now 147.  I spoken with her mother family skin to come and check on her see what they think if she is close to being back to normal or back to normal we will let her go. ----------------------------------------- 8:50 PM on 08/25/2018 -----------------------------------------  Family arrives they feel she is doing well.  I will let her go with them.   Arnaldo NatalMalinda, Aneesah Hernan F, MD 08/25/18 2050

## 2018-08-25 NOTE — ED Notes (Signed)
To MRI

## 2018-08-25 NOTE — ED Triage Notes (Signed)
Per EMS, family called c/o altered mental status.  Pt alert to self.  BS 470.  Pt has taken insulin today along with one sleep aid pill to assist with sleeping.  Pt received 500cc NS enroute to [facility].  92/52.

## 2018-08-25 NOTE — ED Notes (Signed)
Mother called.  Phone given to Dr. Darnelle CatalanMalinda to speak to her.

## 2018-08-25 NOTE — ED Provider Notes (Addendum)
[facility] Emergency Department Provider Note    First MD Initiated Contact with Patient 08/25/18 1350     (approximate)  I have reviewed the triage vital signs and the nursing notes.   HISTORY  Chief Complaint Altered Mental Status  Level V Caveat:  AMS  HPI Kristine Allison is a 37 y.o. female past listed medical history presents the ER due to concern for altered mental status and elevated blood sugar.  Patient uncooperative with exam not providing any additional history.  She denies any pain seems to be staring off about the room.  Appears clinically intoxicated and encephalopathic.  Does have history of DKA.  No report of any trauma.  No measured fevers.  Does have a history of noncompliance with medication.    Past Medical History:  Diagnosis Date  . Anxiety   . Asthma   . Diabetes mellitus without complication (HCC)   . Headache(784.0)    Family History  Problem Relation Age of Onset  . Heart disease Unknown        No family history   Past Surgical History:  Procedure Laterality Date  . NO PAST SURGERIES     Patient Active Problem List   Diagnosis Date Noted  . Anxiety 08/07/2018  . DKA (diabetic ketoacidoses) (HCC) 08/07/2018  . Edema of the tongue 03/26/2018  . Dehydration   . Diabetic ketoacidosis without coma associated with type 1 diabetes mellitus (HCC)   . Yeast infection 02/09/2015  . Acute renal failure syndrome (HCC)   . Tachycardia 11/03/2014  . Insomnia 09/08/2014  . Dizziness 09/08/2014  . Migraine, unspecified 12/14/2013  . Elevated troponin I level 11/19/2013  . Acute myocardial infarction, subendocardial infarction, initial episode of care (HCC) 11/18/2013  . Noncompliance with medication regimen 11/15/2013  . Leg pain 09/08/2013  . Loss of weight 09/08/2013  . Depression 09/08/2013  . Hypotension, unspecified 09/08/2013  . Tobacco use disorder 08/01/2013  . Diabetes mellitus type 1, uncontrolled (HCC)  06/24/2012  . GERD (gastroesophageal reflux disease) 06/17/2012      Prior to Admission medications   Medication Sig Start Date End Date Taking? Authorizing Provider  insulin NPH-regular Human (NOVOLIN 70/30) (70-30) 100 UNIT/ML injection 20 units subcu twice daily Patient taking differently: Inject 12-20 Units into the skin 2 (two) times daily with a meal. Per sliding scale 03/26/18   Katha Hamming, MD    Allergies Latex    Social History Social History   Tobacco Use  . Smoking status: Current Every Day Smoker    Packs/day: 0.25    Years: 20.00    Pack years: 5.00    Types: Cigarettes  . Smokeless tobacco: Never Used  Substance Use Topics  . Alcohol use: No    Alcohol/week: 0.0 standard drinks  . Drug use: No    Review of Systems Patient denies headaches, rhinorrhea, blurry vision, numbness, shortness of breath, chest pain, edema, cough, abdominal pain, nausea, vomiting, diarrhea, dysuria, fevers, rashes or hallucinations unless otherwise stated above in HPI. ____________________________________________   PHYSICAL EXAM:  VITAL SIGNS: Vitals:   08/25/18 1436  BP: 103/68  Pulse: 68  Resp: 16  Temp: 97.7 F (36.5 C)  SpO2: 95%    Constitutional: Alert but disoriented x3.  Appears encephalopathic. Eyes: Conjunctivae are normal.  Head: Atraumatic. Nose: No congestion/rhinnorhea. Mouth/Throat: Mucous membranes are moist.   Neck: No stridor. Painless ROM.  Cardiovascular: Normal rate, regular rhythm. Grossly normal heart sounds.  Good peripheral circulation. Respiratory: Normal  respiratory effort.  No retractions. Lungs CTAB. Gastrointestinal: Soft and nontender. No distention. No abdominal bruits. No CVA tenderness. Genitourinary: deferred Musculoskeletal: No lower extremity tenderness nor edema.  No joint effusions. Neurologic:  Encephalopathic with No gross focal neurologic deficits are appreciated. No facial droop Skin:  Skin is warm, dry and intact.  No rash noted. Psychiatric: appears intoxicated, uncooperative with exam____________________________________________   LABS (all labs ordered are listed, but only abnormal results are displayed)  Results for orders placed or performed during the hospital encounter of 08/25/18 (from the past 24 hour(s))  CBC with Differential/Platelet     Status: Abnormal   Collection Time: 08/25/18  1:56 PM  Result Value Ref Range   WBC 3.3 (L) 4.0 - 10.5 K/uL   RBC 3.86 (L) 3.87 - 5.11 MIL/uL   Hemoglobin 11.9 (L) 12.0 - 15.0 g/dL   HCT 04.5 40.9 - 81.1 %   MCV 94.6 80.0 - 100.0 fL   MCH 30.8 26.0 - 34.0 pg   MCHC 32.6 30.0 - 36.0 g/dL   RDW 91.4 78.2 - 95.6 %   Platelets 149 (L) 150 - 400 K/uL   nRBC 0.0 0.0 - 0.2 %   Neutrophils Relative % 51 %   Neutro Abs 1.7 1.7 - 7.7 K/uL   Lymphocytes Relative 37 %   Lymphs Abs 1.2 0.7 - 4.0 K/uL   Monocytes Relative 8 %   Monocytes Absolute 0.3 0.1 - 1.0 K/uL   Eosinophils Relative 3 %   Eosinophils Absolute 0.1 0.0 - 0.5 K/uL   Basophils Relative 1 %   Basophils Absolute 0.0 0.0 - 0.1 K/uL   Immature Granulocytes 0 %   Abs Immature Granulocytes 0.00 0.00 - 0.07 K/uL  Comprehensive metabolic panel     Status: Abnormal   Collection Time: 08/25/18  1:56 PM  Result Value Ref Range   Sodium 134 (L) 135 - 145 mmol/L   Potassium 4.7 3.5 - 5.1 mmol/L   Chloride 101 98 - 111 mmol/L   CO2 28 22 - 32 mmol/L   Glucose, Bld 565 (HH) 70 - 99 mg/dL   BUN 17 6 - 20 mg/dL   Creatinine, Ser 2.13 0.44 - 1.00 mg/dL   Calcium 7.9 (L) 8.9 - 10.3 mg/dL   Total Protein 5.7 (L) 6.5 - 8.1 g/dL   Albumin 3.4 (L) 3.5 - 5.0 g/dL   AST 15 15 - 41 U/L   ALT 14 0 - 44 U/L   Alkaline Phosphatase 58 38 - 126 U/L   Total Bilirubin 0.5 0.3 - 1.2 mg/dL   GFR calc non Af Amer >60 >60 mL/min   GFR calc Af Amer >60 >60 mL/min   Anion gap 5 5 - 15  Glucose, capillary     Status: Abnormal   Collection Time: 08/25/18  2:00 PM  Result Value Ref Range   Glucose-Capillary 527 (HH)  70 - 99 mg/dL   Comment 1 Notify RN   Urine Drug Screen, Qualitative ([facility] only)     Status: Abnormal   Collection Time: 08/25/18  2:10 PM  Result Value Ref Range   Tricyclic, Ur Screen NONE DETECTED NONE DETECTED   Amphetamines, Ur Screen NONE DETECTED NONE DETECTED   MDMA (Ecstasy)Ur Screen NONE DETECTED NONE DETECTED   Cocaine Metabolite,Ur [place] NONE DETECTED NONE DETECTED   Opiate, Ur Screen NONE DETECTED NONE DETECTED   Phencyclidine (PCP) Ur S NONE DETECTED NONE DETECTED   Cannabinoid 50 Ng, Ur [place] NONE DETECTED NONE DETECTED  Barbiturates, Ur Screen NONE DETECTED NONE DETECTED   Benzodiazepine, Ur Scrn POSITIVE (A) NONE DETECTED   Methadone Scn, Ur NONE DETECTED NONE DETECTED  Urinalysis, Complete w Microscopic     Status: Abnormal   Collection Time: 08/25/18  2:10 PM  Result Value Ref Range   Color, Urine STRAW (A) YELLOW   APPearance CLEAR (A) CLEAR   Specific Gravity, Urine 1.032 (H) 1.005 - 1.030   pH 6.0 5.0 - 8.0   Glucose, UA >=500 (A) NEGATIVE mg/dL   Hgb urine dipstick NEGATIVE NEGATIVE   Bilirubin Urine NEGATIVE NEGATIVE   Ketones, ur NEGATIVE NEGATIVE mg/dL   Protein, ur NEGATIVE NEGATIVE mg/dL   Nitrite NEGATIVE NEGATIVE   Leukocytes, UA NEGATIVE NEGATIVE   RBC / HPF 0-5 0 - 5 RBC/hpf   WBC, UA NONE SEEN 0 - 5 WBC/hpf   Bacteria, UA NONE SEEN NONE SEEN   Squamous Epithelial / LPF 0-5 0 - 5  Blood gas, venous     Status: None   Collection Time: 08/25/18  2:30 PM  Result Value Ref Range   pH, Ven 7.34 7.250 - 7.430   pCO2, Ven 51 44.0 - 60.0 mmHg   pO2, Ven 34.0 32.0 - 45.0 mmHg   Bicarbonate 27.5 20.0 - 28.0 mmol/L   Acid-Base Excess 0.9 0.0 - 2.0 mmol/L   O2 Saturation 61.1 %   Patient temperature 37.0    Collection site VEIN    Sample type VEIN   Pregnancy, urine POC     Status: None   Collection Time: 08/25/18  2:44 PM  Result Value Ref Range   Preg Test, Ur NEGATIVE NEGATIVE   ____________________________________________  EKG My review and  personal interpretation at Time: 14:11   Indication: ams  Rate: 75  Rhythm: sinus Axis: normal Other: normal intervals, no stemi, ____________________________________________  RADIOLOGY  I personally reviewed all radiographic images ordered to evaluate for the above acute complaints and reviewed radiology reports and findings.  These findings were personally discussed with the patient.  Please see medical record for radiology report.  ____________________________________________   PROCEDURES  Procedure(s) performed:  .Critical Care Performed by: Willy Eddy, MD Authorized by: Willy Eddy, MD   Critical care provider statement:    Critical care time (minutes):  5   Critical care time was exclusive of:  Separately billable procedures and treating other patients   Critical care was necessary to treat or prevent imminent or life-threatening deterioration of the following conditions:  Dehydration   Critical care was time spent personally by me on the following activities:  Development of treatment plan with patient or surrogate, discussions with consultants, evaluation of patient's response to treatment, examination of patient, obtaining history from patient or surrogate, ordering and performing treatments and interventions, ordering and review of laboratory studies, ordering and review of radiographic studies, pulse oximetry, re-evaluation of patient's condition and review of old charts      Critical Care performed: yes ____________________________________________   INITIAL IMPRESSION / ASSESSMENT AND PLAN / ED COURSE  Pertinent labs & imaging results that were available during my care of the patient were reviewed by me and considered in my medical decision making (see chart for details).   DDX: Dehydration, sepsis, pna, uti, hyper/oglycemia, dka, cva, drug effect, withdrawal, encephalitis   Jordynne E Sabreen Kitchen is a 37 y.o. who presents to the ED with symptoms as  described above.  Does appear encephalopathic and almost intoxicated.  Will send tox panel.  The patient will be  placed on continuous pulse oximetry and telemetry for monitoring.  Laboratory evaluation will be sent to evaluate for the above complaints.  Will give IV fluids as well.  Clinical Course as of Aug 26 1523  Wed Aug 25, 2018  1418 Patient become increasingly agitated.  Difficult to redirect.  Does appear encephalopathic.  Will order CT imaging.  Still waiting on the remainder of her blood work.  Will give calling medication in the form of IV Haldol.  She is normal QT.  Giving IV fluids.   [PR]  1433 Blood work is not consistent with DKA.  There is significant hyperglycemia.  Patient more comfortable at this time.  Denies any medication ingestion.  Will order CT imaging of the head given her encephalopathy.  Doubt CVA though the patient is at increased risk given her hyperglycemia.   [PR]  1510 UDS is positive for benzos which could explain the patient's altered mental status.  On my review of the CT had I do not see any evidence of mass lesion or bleed.  Patient without any report of SI.  No evidence of DKA.  Will continue with IV fluids.  Have given IV insulin for hyperglycemia.  Patient will be signed out to oncoming physician pending reassessment.   [PR]    Clinical Course User Index [PR] Willy Eddyobinson, Joann Jorge, MD     As part of my medical decision making, I reviewed the following data within the electronic MEDICAL RECORD NUMBER Nursing notes reviewed and incorporated, Labs reviewed, notes from prior ED visits.   ____________________________________________   FINAL CLINICAL IMPRESSION(S) / ED DIAGNOSES  Final diagnoses:  Altered mental status, unspecified altered mental status type  Hyperglycemia  Benzodiazepine abuse (HCC)      NEW MEDICATIONS STARTED DURING THIS VISIT:  New Prescriptions   No medications on file     Note:  This document was prepared using Dragon voice  recognition software and may include unintentional dictation errors.    Willy Eddyobinson, Jadore Mcguffin, MD 08/25/18 1512    Willy Eddyobinson, Tyquez Hollibaugh, MD 08/25/18 1524

## 2018-08-25 NOTE — ED Notes (Signed)
Pt very agitated, pulling off leads.  Willy EddyPatrick Robinson, MD made aware.

## 2018-08-25 NOTE — ED Notes (Signed)
Blood sugar = 527

## 2018-11-27 ENCOUNTER — Other Ambulatory Visit: Payer: Self-pay | Admitting: Family Medicine

## 2018-11-27 DIAGNOSIS — E119 Type 2 diabetes mellitus without complications: Secondary | ICD-10-CM

## 2019-01-13 ENCOUNTER — Encounter: Payer: Self-pay | Admitting: Emergency Medicine

## 2019-01-13 ENCOUNTER — Other Ambulatory Visit: Payer: Self-pay

## 2019-01-13 ENCOUNTER — Emergency Department: Payer: Self-pay

## 2019-01-13 ENCOUNTER — Inpatient Hospital Stay
Admission: EM | Admit: 2019-01-13 | Discharge: 2019-01-18 | DRG: 637 | Disposition: A | Discharge status: Home / Self Care | Payer: Self-pay | Attending: Internal Medicine | Admitting: Internal Medicine

## 2019-01-13 DIAGNOSIS — E1110 Type 2 diabetes mellitus with ketoacidosis without coma: Secondary | ICD-10-CM | POA: Diagnosis present

## 2019-01-13 DIAGNOSIS — E1043 Type 1 diabetes mellitus with diabetic autonomic (poly)neuropathy: Secondary | ICD-10-CM | POA: Diagnosis present

## 2019-01-13 DIAGNOSIS — J45909 Unspecified asthma, uncomplicated: Secondary | ICD-10-CM | POA: Diagnosis present

## 2019-01-13 DIAGNOSIS — Z716 Tobacco abuse counseling: Secondary | ICD-10-CM

## 2019-01-13 DIAGNOSIS — E875 Hyperkalemia: Secondary | ICD-10-CM | POA: Diagnosis present

## 2019-01-13 DIAGNOSIS — Z794 Long term (current) use of insulin: Secondary | ICD-10-CM

## 2019-01-13 DIAGNOSIS — E860 Dehydration: Secondary | ICD-10-CM | POA: Diagnosis present

## 2019-01-13 DIAGNOSIS — E1011 Type 1 diabetes mellitus with ketoacidosis with coma: Secondary | ICD-10-CM

## 2019-01-13 DIAGNOSIS — N179 Acute kidney failure, unspecified: Secondary | ICD-10-CM | POA: Diagnosis present

## 2019-01-13 DIAGNOSIS — G9341 Metabolic encephalopathy: Secondary | ICD-10-CM | POA: Diagnosis present

## 2019-01-13 DIAGNOSIS — F329 Major depressive disorder, single episode, unspecified: Secondary | ICD-10-CM | POA: Diagnosis present

## 2019-01-13 DIAGNOSIS — I252 Old myocardial infarction: Secondary | ICD-10-CM

## 2019-01-13 DIAGNOSIS — F17210 Nicotine dependence, cigarettes, uncomplicated: Secondary | ICD-10-CM | POA: Diagnosis present

## 2019-01-13 DIAGNOSIS — R0902 Hypoxemia: Secondary | ICD-10-CM

## 2019-01-13 DIAGNOSIS — K3184 Gastroparesis: Secondary | ICD-10-CM | POA: Diagnosis present

## 2019-01-13 DIAGNOSIS — E1010 Type 1 diabetes mellitus with ketoacidosis without coma: Principal | ICD-10-CM | POA: Diagnosis present

## 2019-01-13 DIAGNOSIS — Z91040 Latex allergy status: Secondary | ICD-10-CM

## 2019-01-13 LAB — COMPREHENSIVE METABOLIC PANEL
ALT: 29 U/L (ref 0–44)
AST: 27 U/L (ref 15–41)
Albumin: 4.1 g/dL (ref 3.5–5.0)
Alkaline Phosphatase: 132 U/L — ABNORMAL HIGH (ref 38–126)
Anion gap: 34 — ABNORMAL HIGH (ref 5–15)
BUN: 62 mg/dL — ABNORMAL HIGH (ref 6–20)
CO2: 9 mmol/L — ABNORMAL LOW (ref 22–32)
Calcium: 8.6 mg/dL — ABNORMAL LOW (ref 8.9–10.3)
Chloride: 85 mmol/L — ABNORMAL LOW (ref 98–111)
Creatinine, Ser: 1.94 mg/dL — ABNORMAL HIGH (ref 0.44–1.00)
GFR calc Af Amer: 38 mL/min — ABNORMAL LOW (ref >60)
GFR calc non Af Amer: 33 mL/min — ABNORMAL LOW (ref >60)
Glucose, Bld: 997 mg/dL (ref 70–99)
Potassium: 6.7 mmol/L (ref 3.5–5.1)
Sodium: 128 mmol/L — ABNORMAL LOW (ref 135–145)
Total Bilirubin: 1.5 mg/dL — ABNORMAL HIGH (ref 0.3–1.2)
Total Protein: 7.2 g/dL (ref 6.5–8.1)

## 2019-01-13 LAB — URINALYSIS, COMPLETE (UACMP) WITH MICROSCOPIC
Bilirubin Urine: NEGATIVE
Glucose, UA: >=500 mg/dL — AB
Ketones, ur: 20 mg/dL — AB
Leukocytes,Ua: NEGATIVE
Nitrite: NEGATIVE
Protein, ur: NEGATIVE mg/dL
Specific Gravity, Urine: 1.017 (ref 1.005–1.030)
Waxy Casts, UA: 1
pH: 5 (ref 5.0–8.0)

## 2019-01-13 LAB — URINE DRUG SCREEN, QUALITATIVE (ARMC ONLY)
Amphetamines, Ur Screen: NOT DETECTED
Barbiturates, Ur Screen: NOT DETECTED
Benzodiazepine, Ur Scrn: POSITIVE — AB
Cannabinoid 50 Ng, Ur ~~LOC~~: NOT DETECTED
Cocaine Metabolite,Ur ~~LOC~~: NOT DETECTED
MDMA (Ecstasy)Ur Screen: NOT DETECTED
Methadone Scn, Ur: NOT DETECTED
Opiate, Ur Screen: NOT DETECTED
Phencyclidine (PCP) Ur S: NOT DETECTED
Tricyclic, Ur Screen: NOT DETECTED

## 2019-01-13 LAB — BLOOD GAS, VENOUS
Acid-base deficit: 20.5 mmol/L — ABNORMAL HIGH (ref 0.0–2.0)
Bicarbonate: 9.5 mmol/L — ABNORMAL LOW (ref 20.0–28.0)
FIO2: 0.21
O2 Saturation: 77.6 %
Patient temperature: 37
pCO2, Ven: 36 mmHg — ABNORMAL LOW (ref 44.0–60.0)
pH, Ven: 7.03 — CL (ref 7.250–7.430)
pO2, Ven: 63 mmHg — ABNORMAL HIGH (ref 32.0–45.0)

## 2019-01-13 LAB — BASIC METABOLIC PANEL
Anion gap: 18 — ABNORMAL HIGH (ref 5–15)
BUN: 49 mg/dL — ABNORMAL HIGH (ref 6–20)
CO2: 13 mmol/L — ABNORMAL LOW (ref 22–32)
Calcium: 7.4 mg/dL — ABNORMAL LOW (ref 8.9–10.3)
Chloride: 110 mmol/L (ref 98–111)
Creatinine, Ser: 1.44 mg/dL — ABNORMAL HIGH (ref 0.44–1.00)
GFR calc Af Amer: 54 mL/min — ABNORMAL LOW (ref >60)
GFR calc non Af Amer: 47 mL/min — ABNORMAL LOW (ref >60)
Glucose, Bld: 482 mg/dL — ABNORMAL HIGH (ref 70–99)
Potassium: 4.1 mmol/L (ref 3.5–5.1)
Sodium: 141 mmol/L (ref 135–145)

## 2019-01-13 LAB — CBC WITH DIFFERENTIAL/PLATELET
Abs Immature Granulocytes: 0.11 10*3/uL — ABNORMAL HIGH (ref 0.00–0.07)
Basophils Absolute: 0 10*3/uL (ref 0.0–0.1)
Basophils Relative: 0 %
Eosinophils Absolute: 0 10*3/uL (ref 0.0–0.5)
Eosinophils Relative: 0 %
HCT: 42.3 % (ref 36.0–46.0)
Hemoglobin: 12.7 g/dL (ref 12.0–15.0)
Immature Granulocytes: 1 %
Lymphocytes Relative: 10 %
Lymphs Abs: 1.3 10*3/uL (ref 0.7–4.0)
MCH: 31.4 pg (ref 26.0–34.0)
MCHC: 30 g/dL (ref 30.0–36.0)
MCV: 104.4 fL — ABNORMAL HIGH (ref 80.0–100.0)
Monocytes Absolute: 1.3 10*3/uL — ABNORMAL HIGH (ref 0.1–1.0)
Monocytes Relative: 9 %
Neutro Abs: 10.8 10*3/uL — ABNORMAL HIGH (ref 1.7–7.7)
Neutrophils Relative %: 80 %
Platelets: 255 10*3/uL (ref 150–400)
RBC: 4.05 MIL/uL (ref 3.87–5.11)
RDW: 13.1 % (ref 11.5–15.5)
WBC: 13.5 10*3/uL — ABNORMAL HIGH (ref 4.0–10.5)
nRBC: 0 % (ref 0.0–0.2)

## 2019-01-13 LAB — GLUCOSE, CAPILLARY
Glucose-Capillary: >600 mg/dL (ref 70–99)
Glucose-Capillary: >600 mg/dL (ref 70–99)
Glucose-Capillary: >600 mg/dL (ref 70–99)
Glucose-Capillary: 586 mg/dL (ref 70–99)
Glucose-Capillary: 437 mg/dL — ABNORMAL HIGH (ref 70–99)
Glucose-Capillary: 425 mg/dL — ABNORMAL HIGH (ref 70–99)

## 2019-01-13 LAB — LIPASE, BLOOD: Lipase: 17 U/L (ref 11–51)

## 2019-01-13 LAB — MRSA PCR SCREENING: MRSA by PCR: NEGATIVE

## 2019-01-13 LAB — PREGNANCY, URINE: Preg Test, Ur: NEGATIVE

## 2019-01-13 MED ORDER — SODIUM CHLORIDE 0.9 % IV BOLUS
2000.0000 mL | Freq: Once | INTRAVENOUS | Status: AC
  Administered 2019-01-13: 20:00:00 2000 mL via INTRAVENOUS

## 2019-01-13 MED ORDER — DEXTROSE-NACL 5-0.45 % IV SOLN
INTRAVENOUS | Status: DC
  Administered 2019-01-14: 02:00:00 via INTRAVENOUS

## 2019-01-13 MED ORDER — ACETAMINOPHEN 650 MG RE SUPP: 650.0000 mg | Freq: Four times a day (QID) | RECTAL | Status: DC | PRN

## 2019-01-13 MED ORDER — ONDANSETRON HCL 4 MG/2ML IJ SOLN
4.0000 mg | Freq: Four times a day (QID) | INTRAMUSCULAR | Status: DC | PRN
  Administered 2019-01-14 – 2019-01-17 (×5): 4 mg via INTRAVENOUS
  Filled 2019-01-13 (×5): qty 2

## 2019-01-13 MED ORDER — ALBUTEROL SULFATE (2.5 MG/3ML) 0.083% IN NEBU
2.5000 mg | INHALATION_SOLUTION | RESPIRATORY_TRACT | Status: DC | PRN
  Administered 2019-01-14 – 2019-01-15 (×2): 2.5 mg via RESPIRATORY_TRACT
  Filled 2019-01-13 (×2): qty 3

## 2019-01-13 MED ORDER — ENOXAPARIN SODIUM 40 MG/0.4ML ~~LOC~~ SOLN
40.0000 mg | SUBCUTANEOUS | Status: DC
  Administered 2019-01-13 – 2019-01-17 (×5): 40 mg via SUBCUTANEOUS
  Filled 2019-01-13 (×5): qty 0.4

## 2019-01-13 MED ORDER — INSULIN REGULAR(HUMAN) IN NACL 100-0.9 UT/100ML-% IV SOLN
INTRAVENOUS | Status: DC
  Administered 2019-01-13: 5.4 [IU]/h via INTRAVENOUS
  Filled 2019-01-13 (×2): qty 100

## 2019-01-13 MED ORDER — ACETAMINOPHEN 325 MG PO TABS
650.0000 mg | ORAL_TABLET | Freq: Four times a day (QID) | ORAL | Status: DC | PRN
  Administered 2019-01-13 – 2019-01-17 (×3): 650 mg via ORAL
  Filled 2019-01-13 (×3): qty 2

## 2019-01-13 MED ORDER — SODIUM CHLORIDE 0.9 % IV SOLN
INTRAVENOUS | Status: AC
  Administered 2019-01-13: 22:00:00 999 mL/h via INTRAVENOUS

## 2019-01-13 MED ORDER — DEXTROSE-NACL 5-0.45 % IV SOLN: INTRAVENOUS | Status: DC

## 2019-01-13 MED ORDER — BUDESONIDE 0.5 MG/2ML IN SUSP
0.5000 mg | Freq: Two times a day (BID) | RESPIRATORY_TRACT | Status: DC
  Administered 2019-01-14 – 2019-01-15 (×2): 0.5 mg via RESPIRATORY_TRACT
  Filled 2019-01-13 (×3): qty 2

## 2019-01-13 MED ORDER — POLYETHYLENE GLYCOL 3350 17 G PO PACK
17.0000 g | PACK | Freq: Every day | ORAL | Status: DC | PRN
  Administered 2019-01-15: 16:00:00 17 g via ORAL
  Filled 2019-01-13: qty 1

## 2019-01-13 MED ORDER — ONDANSETRON HCL 4 MG PO TABS
4.0000 mg | ORAL_TABLET | Freq: Four times a day (QID) | ORAL | Status: DC | PRN
  Administered 2019-01-17: 4 mg via ORAL
  Filled 2019-01-13: qty 1

## 2019-01-13 MED ORDER — SODIUM CHLORIDE 0.9 % IV SOLN
INTRAVENOUS | Status: DC
  Administered 2019-01-13: 23:00:00 via INTRAVENOUS

## 2019-01-13 MED ORDER — SODIUM CHLORIDE 0.9 % IV SOLN
1000.0000 mL | Freq: Once | INTRAVENOUS | Status: AC
  Administered 2019-01-13: 18:00:00 1000 mL via INTRAVENOUS

## 2019-01-13 MED ORDER — PROMETHAZINE HCL 25 MG/ML IJ SOLN
12.5000 mg | Freq: Four times a day (QID) | INTRAMUSCULAR | Status: DC | PRN
  Administered 2019-01-14 – 2019-01-15 (×3): 12.5 mg via INTRAVENOUS
  Filled 2019-01-13 (×2): qty 1

## 2019-01-13 MED ORDER — SODIUM CHLORIDE 0.9 % IV SOLN: INTRAVENOUS | Status: DC

## 2019-01-13 NOTE — Progress Notes (Signed)
eLink Physician-Brief Progress Note Patient Name: [name] DOB: April 19, 1981 MRN: [id number]   Date of Service  01/13/2019  HPI/Events of Note  Pt admitted with DKA   eICU Interventions  New patient evaluation completed        Migdalia Dk 01/13/2019, 10:04 PM

## 2019-01-13 NOTE — Consult Note (Signed)
Name: Kristine Allison MRN: [id number] DOB: [birth year]    ADMISSION DATE:  01/13/2019 CONSULTATION DATE:  01/13/2019  REFERRING MD :  Dr. Elpidio Anis  CHIEF COMPLAINT:  Altered Mental Status, Weakness, Severe Hyperglycemia  BRIEF PATIENT DESCRIPTION:  38 year old female with past medical history notable for diabetes type 1 with episodes of DKA, anxiety and asthma who is admitted with severe DKA requiring insulin drip, AKI, and severe hyperkalemia with noted tall peaked T waves on EKG.  SIGNIFICANT EVENTS  01/13/19>> admission to [facility] stepdown unit  STUDIES:  N/A  CULTURES: N/A  ANTIBIOTICS: N/A  HISTORY OF PRESENT ILLNESS:   Kristine Allison is a 38 year old female with a past medical history notable for diabetes type 1 with episodes of DKA, anxiety, and asthma who presents to [facility] ED on 01/13/2019 with complaints of altered mental status and generalized weakness.  She she reports that she has had nausea and vomiting, and polyuria.  She reports poor p.o. intake for the past few days, but that she has been compliant with her home Novolin 70/30.  She denies fever, chills, shortness of breath, chest pain, cough, abdominal pain, or diarrhea.  She did mention that her sister was discharged from the hospital today and could have possibly been tested for COVID-18 .  Called to follow-up with patient's mother, and patient's mother reports that the patient sister was discharged home from [facility] today due to partial bowel obstruction, no known COVID work-up or contacts. Initial work-up in the ED reveals glucose 997, serum bicarb 9, anion gap 34, sodium 128, potassium 6.7, creatinine 1.94.  Venous blood gas with pH 7.03 /PCO2 36 /O2 63 /bicarb 9.5.  WBCs 13.5, chest x-ray is negative, and urinalysis is negative for UTI but positive for ketones. Urine drug screen is positive for benzodiazepines.  EKG with sinus tachycardia with tall peaked T waves in the lateral leads.  She is admitted to [facility] stepdown  unit for treatment of severe DKA requiring insulin drip, acute kidney injury, hyperkalemia,  pseudohyponatremia, and acute metabolic encephalopathy.  PCCM is consulted for further management.  PAST MEDICAL HISTORY :   has a past medical history of Anxiety, Asthma, Diabetes mellitus without complication (HCC), and Headache(784.0).  has a past surgical history that includes No past surgeries. Prior to Admission medications   Medication Sig Start Date End Date Taking? Authorizing Provider  insulin NPH-regular Human (NOVOLIN 70/30) (70-30) 100 UNIT/ML injection 20 units subcu twice daily Patient taking differently: Inject 12-20 Units into the skin 2 (two) times daily with a meal. Per sliding scale 03/26/18   Katha Hamming, MD   Allergies  Allergen Reactions  . Latex Rash    FAMILY HISTORY:  family history includes Heart disease in an other family member. SOCIAL HISTORY:  reports that she has been smoking cigarettes. She has a 5.00 pack-year smoking history. She has never used smokeless tobacco. She reports that she does not drink alcohol or use drugs.   REVIEW OF SYSTEMS: Positives in bold Constitutional: Negative for fever, chills, weight loss, +malaise/fatigue and diaphoresis.  HENT: Negative for hearing loss, ear pain, nosebleeds, congestion, sore throat, neck pain, tinnitus and ear discharge.   Eyes: Negative for blurred vision, double vision, photophobia, pain, discharge and redness.  Respiratory: Negative for cough, hemoptysis, sputum production, shortness of breath, wheezing and stridor.   Cardiovascular: Negative for chest pain, palpitations, orthopnea, claudication, leg swelling and PND.  Gastrointestinal: Negative for heartburn, +nausea, +vomiting, +abdominal pain, diarrhea, constipation, blood in stool and melena.  Genitourinary: Negative for dysuria, urgency, frequency, hematuria and flank pain.  Musculoskeletal: Negative for myalgias, back pain, joint pain and falls.   Skin: Negative for itching and rash.  Neurological: Negative for dizziness, tingling, tremors, sensory change, speech change, focal weakness, seizures, loss of consciousness, weakness and headaches.  Endo/Heme/Allergies: Negative for environmental allergies and polydipsia. Does not bruise/bleed easily.  SUBJECTIVE:  -Patient reports generalized weakness and fatigue ,nausea, vomiting, abdominal tenderness to the left lower quadrant on palpation -Denies shortness of breath, cough, chest pain, fever/chills -Patient had reported possible sick contact at home, followed up with patient's mother who reports that patient [facility], no COVID contacts at home -On 4 L nasal cannula  VITAL SIGNS: Temp:  [98 F (36.7 C)] 98 F (36.7 C) (04/23 1803) Pulse Rate:  [116-121] 121 (04/23 1830) Resp:  [19-37] 37 (04/23 1830) BP: (82-123)/(41-72) 103/55 (04/23 1830) SpO2:  [93 %-100 %] 93 % (04/23 1830) Weight:  [81.6 kg] 81.6 kg (04/23 1729)  PHYSICAL EXAMINATION: General: Acutely ill-appearing female, laying in bed, on nasal cannula, in no acute distress Neuro: Lethargic, arouses to voice, follows commands, no focal deficits, speech clear, alert and oriented x3 HEENT: Atraumatic, normocephalic, neck supple, no JVD, pupils PERRLA Cardiovascular: Tachycardia, regular rhythm, S1-S2, no murmurs rubs or gallops, 2+ pulses Lungs: Slight expiratory wheezing upon auscultation bilaterally, even, nonlabored, normal effort, no accessory muscle use Abdomen: Soft, tenderness to left lower quadrant upon palpation, no guarding or rebound tenderness, sounds positive x4 Musculoskeletal: Generalized weakness, no deformities, normal bulk and tone, no edema Skin: Warm and dry, no obvious rashes lesions or ulcerations  Recent Labs  Lab 01/13/19 1737  NA 128*  K 6.7*  CL 85*  CO2 9*  BUN 62*  CREATININE 1.94*  GLUCOSE 997*   Recent Labs  Lab 01/13/19  1737  HGB 12.7  HCT 42.3  WBC 13.5*  PLT 255   Dg Chest Port 1 View  Result Date: 01/13/2019 CLINICAL DATA:  Weakness and altered mental status. EXAM: PORTABLE CHEST 1 VIEW COMPARISON:  06/11/2018. FINDINGS: The heart size and mediastinal contours are within normal limits. Both lungs are clear. The visualized skeletal structures are unremarkable. IMPRESSION: No active disease.  Stable exam. Electronically Signed   By: Elsie StainJohn T Curnes M.D.   On: 01/13/2019 19:17    ASSESSMENT / PLAN:  Severe DKA -Follow DKA protocol -Aggressive IV fluids (to receive 3 L normal saline bolus in the ED) -IV insulin drip -Follow BMP every 4 hours -Once anion gap closed can convert to long-acting insulin and sliding scale insulin -Consult diabetes coordinator, appreciate input  Anion gap metabolic acidosis in the setting of DKA AKI Hyperkalemia with tall peaked T waves on EKG Pseudohyponatremia (corrected Na given glucose of 997 is Na 142) -Monitor I&O's / urinary output -Follow BMP -Ensure adequate renal perfusion -Avoid nephrotoxic agents as able -Replace electrolytes as indicated -Aggressive IV fluids -Hyperkalemia should improve as acidosis is corrected and with IV insulin drip; will follow up BMP, patient may need calcium gluconate, Na Bicarb, and Kayexalate -Cardiac monitoring  Leukocytosis, no obvious source of infection -Monitor fever curve -Trend WBCs -Chest x-ray is negative -Urinalysis is negative  Acute metabolic encephalopathy in setting of severe DKA and acidosis>> improving -Provide supportive care -Avoid sedating meds as able -Treat DKA -If mentation does not improve with correction of DKA, will need to obtain head CT  History of asthma, slight inspiratory wheezing noted on exam -Supplemental O2  as needed to maintain O2 sats greater than 92% -PRN bronchodilators, scheduled budesonide nebs -Will hold off on systemic steroids for now    Disposition: Stepdown Goals of care:  Full code VTE prophylaxis: Lovenox Updates: Updated patient at bedside 01/13/2019, and updated patient's mother via telephone  Harlon Ditty, [facility] Pager: [phone] Cell: [phone]  01/13/2019, 7:25 PM

## 2019-01-13 NOTE — ED Provider Notes (Signed)
[facility] Emergency Department Provider Note   ____________________________________________    I have reviewed the triage vital signs and the nursing notes.   HISTORY  Chief Complaint Weakness, altered mental status    HPI Kristine Allison is a 38 y.o. female with a history of diabetes who presents with weakness, altered mental status.  Symptoms are reportedly similar to what she has had in the past with DKA.  Patient says she feels nauseated but otherwise did not offer significant history.  EMS reports patient initially 88% on room air and they put her on a nonrebreather.  No fevers reported.  Review of medical records demonstrates admission for DKA in the past with similar presentation  Past Medical History:  Diagnosis Date  . Anxiety   . Asthma   . Diabetes mellitus without complication (HCC)   . RUEAVWUJ(811.9)     Patient Active Problem List   Diagnosis Date Noted  . Anxiety 08/07/2018  . DKA (diabetic ketoacidoses) (HCC) 08/07/2018  . Edema of the tongue 03/26/2018  . Dehydration   . Diabetic ketoacidosis without coma associated with type 1 diabetes mellitus (HCC)   . Yeast infection 02/09/2015  . Acute renal failure syndrome (HCC)   . Tachycardia 11/03/2014  . Insomnia 09/08/2014  . Dizziness 09/08/2014  . Migraine, unspecified 12/14/2013  . Elevated troponin I level 11/19/2013  . Acute myocardial infarction, subendocardial infarction, initial episode of care (HCC) 11/18/2013  . Noncompliance with medication regimen 11/15/2013  . Leg pain 09/08/2013  . Loss of weight 09/08/2013  . Depression 09/08/2013  . Hypotension, unspecified 09/08/2013  . Tobacco use disorder 08/01/2013  . Diabetes mellitus type 1, uncontrolled (HCC) 06/24/2012  . GERD (gastroesophageal reflux disease) 06/17/2012    Past Surgical History:  Procedure Laterality Date  . NO PAST SURGERIES      Prior to Admission medications   Medication Sig Start  Date End Date Taking? Authorizing Provider  insulin NPH-regular Human (NOVOLIN 70/30) (70-30) 100 UNIT/ML injection 20 units subcu twice daily Patient taking differently: Inject 12-20 Units into the skin 2 (two) times daily with a meal. Per sliding scale 03/26/18   Katha Hamming, MD     Allergies Latex  Family History  Problem Relation Age of Onset  . Heart disease Other        No family history    Social History Social History   Tobacco Use  . Smoking status: Current Every Day Smoker    Packs/day: 0.25    Years: 20.00    Pack years: 5.00    Types: Cigarettes  . Smokeless tobacco: Never Used  Substance Use Topics  . Alcohol use: No    Alcohol/week: 0.0 standard drinks  . Drug use: No    Review of Systems limited by altered mental status  Constitutional: No fever/chills  Gastrointestinal: Nauseated   ____________________________________________   PHYSICAL EXAM:  VITAL SIGNS: ED Triage Vitals  Enc Vitals Group     BP 01/13/19 1726 (!) 96/47     Pulse Rate 01/13/19 1727 (!) 116     Resp 01/13/19 1726 19     Temp 01/13/19 1803 98 F (36.7 C)     Temp Source 01/13/19 1727 Axillary     SpO2 01/13/19 1727 99 %     Weight 01/13/19 1729 81.6 kg (180 lb)     Height 01/13/19 1729 1.676 m ( )     Head Circumference --      Peak Flow --  Pain Score --      Pain Loc --      Pain Edu? --      Excl. in GC? --     Constitutional: Arousable but quickly drifts off Eyes: Conjunctivae are normal.   Nose: No congestion/rhinnorhea. Mouth/Throat: Mucous membranes are dry  Cardiovascular: Tachycardia regular rhythm. Grossly normal heart sounds.  Good peripheral circulation. Respiratory: Mild tachypnea, no wheezing Gastrointestinal: Soft and nontender. No distention.    Musculoskeletal:Warm and well perfused Neurologic:   Arousable and will answer questions but quickly drifts off, does move all extremities Skin:  Skin is warm, dry and intact. No rash  noted.   ____________________________________________   LABS (all labs ordered are listed, but only abnormal results are displayed)  Labs Reviewed  CBC WITH DIFFERENTIAL/PLATELET - Abnormal; Notable for the following components:      Result Value   WBC 13.5 (*)    MCV 104.4 (*)    Neutro Abs 10.8 (*)    Monocytes Absolute 1.3 (*)    Abs Immature Granulocytes 0.11 (*)    All other components within normal limits  COMPREHENSIVE METABOLIC PANEL - Abnormal; Notable for the following components:   Sodium 128 (*)    Potassium 6.7 (*)    Chloride 85 (*)    CO2 9 (*)    Glucose, Bld 997 (*)    BUN 62 (*)    Creatinine, Ser 1.94 (*)    Calcium 8.6 (*)    Alkaline Phosphatase 132 (*)    Total Bilirubin 1.5 (*)    GFR calc non Af Amer 33 (*)    GFR calc Af Amer 38 (*)    Anion gap 34 (*)    All other components within normal limits  BLOOD GAS, VENOUS - Abnormal; Notable for the following components:   pH, Ven 7.03 (*)    pCO2, Ven 36 (*)    pO2, Ven 63.0 (*)    Bicarbonate 9.5 (*)    Acid-base deficit 20.5 (*)    All other components within normal limits  GLUCOSE, CAPILLARY - Abnormal; Notable for the following components:   Glucose-Capillary >600 (*)    All other components within normal limits  GLUCOSE, CAPILLARY - Abnormal; Notable for the following components:   Glucose-Capillary >600 (*)    All other components within normal limits  LIPASE, BLOOD  URINALYSIS, COMPLETE (UACMP) WITH MICROSCOPIC   ____________________________________________  EKG  ED ECG REPORT I, Jene Everyobert Orlandis Sanden, the attending physician, personally viewed and interpreted this ECG.  Date: 01/13/2019  Rate: 118 Rhythm: Sinus tachycardia QRS Axis: normal Intervals: normal ST/T Wave abnormalities: normal Narrative Interpretation: Question peaked T waves  ____________________________________________  RADIOLOGY  None ____________________________________________   PROCEDURES  Procedure(s)  performed: No  Procedures   Critical Care performed: yes  CRITICAL CARE Performed by: Jene Everyobert Parys Elenbaas   Total critical care time: 40 minutes  Critical care time was exclusive of separately billable procedures and treating other patients.  Critical care was necessary to treat or prevent imminent or life-threatening deterioration.  Critical care was time spent personally by me on the following activities: development of treatment plan with patient and/or surrogate as well as nursing, discussions with consultants, evaluation of patient's response to treatment, examination of patient, obtaining history from patient or surrogate, ordering and performing treatments and interventions, ordering and review of laboratory studies, ordering and review of radiographic studies, pulse oximetry and re-evaluation of patient's condition.  ____________________________________________   INITIAL IMPRESSION / ASSESSMENT AND PLAN / ED  COURSE  Pertinent labs & imaging results that were available during my care of the patient were reviewed by me and considered in my medical decision making (see chart for details).  Patient presents with altered mental status.  Presentation is suspicious for DKA given her history, CBG greater than 600 pending VBG, labs will start IV fluids placed on the cardiac monitor  Notified of pH of 7.03 on VBG consistent with DKA  ----------------------------------------- 6:34 PM on 01/13/2019 -----------------------------------------  BMP has returned with elevated potassium, glucose of 997, elevated creatinine and an anion gap of 34 consistent with DKA, insulin drip ordered  Will discuss with the hospitalist for admission    ____________________________________________   FINAL CLINICAL IMPRESSION(S) / ED DIAGNOSES  Final diagnoses:  Diabetic ketoacidosis without coma associated with type 1 diabetes mellitus (HCC)        Note:  This document was prepared using Dragon  voice recognition software and may include unintentional dictation errors.   Jene Every, MD 01/13/19 1850

## 2019-01-13 NOTE — ED Notes (Signed)
Date and time results received: 01/13/19 6:09 PM  (use smartphrase ".now" to insert current time)  Test: PH, PCO2, PO2 Critical Value: ph:7.03; PCO2 36; PO2 63  Name of Provider Notified: Kinner MD  Orders Received? Or Actions Taken?: Actions Taken: EDP Kinner notified of critical results.

## 2019-01-13 NOTE — ED Notes (Signed)
Date and time results received: 01/13/19 6:24 PM  (use smartphrase ".now" to insert current time)  Test: potassium, glucose Critical Value: potassium 6.7; glucose 997  Name of Provider Notified: kinner MD  Orders Received? Or Actions Taken?: Actions Taken: MD kinner notified of crititcal results.

## 2019-01-13 NOTE — ED Triage Notes (Addendum)
Pt from home via [facility]. Per EMS, pt c/o SHOB, weakness. Per pt she has had similar symptoms "every 6 months" when in "DKA". Pt wearing a NRB at 10L sating at 99% , pt lethargic upon arrival. Per EMS; CGB 564. MD Cyril Loosen upon arrival.

## 2019-01-13 NOTE — H&P (Signed)
[facility] [place] [facility]   PATIENT NAME: Kristine Allison    MR#:  [id number]  DATE OF BIRTH:  10/14/1980  DATE OF ADMISSION:  01/13/2019  PRIMARY CARE PHYSICIAN: Patient, No Pcp Per   REQUESTING/REFERRING PHYSICIAN: Dr. Cyril LoosenKinner  CHIEF COMPLAINT:   Chief Complaint  Patient presents with  . Weakness  . Shortness of Breath  . Altered Mental Status    HISTORY OF PRESENT ILLNESS:  Kristine Allison  is a 38 y.o. female with a known history of diabetes mellitus, recurrent admissions for DKA, anxiety, asthma presents to the emergency room with feeling extremely weak, blood sugars in the 900s and severe DKA.  Patient is not providing much history.  Wakes up on calling her name and then falls back to sleep.  Here her venous pH was 7.03 with anion gap 34 and bicarb of 9.  She has been given 1 L normal saline and started on insulin drip.  PAST MEDICAL HISTORY:   Past Medical History:  Diagnosis Date  . Anxiety   . Asthma   . Diabetes mellitus without complication (HCC)   . Headache(784.0)     PAST SURGICAL HISTORY:   Past Surgical History:  Procedure Laterality Date  . NO PAST SURGERIES      SOCIAL HISTORY:   Social History   Tobacco Use  . Smoking status: Current Every Day Smoker    Packs/day: 0.25    Years: 20.00    Pack years: 5.00    Types: Cigarettes  . Smokeless tobacco: Never Used  Substance Use Topics  . Alcohol use: No    Alcohol/week: 0.0 standard drinks    FAMILY HISTORY:   Family History  Problem Relation Age of Onset  . Heart disease Other        No family history    DRUG ALLERGIES:   Allergies  Allergen Reactions  . Latex Rash    REVIEW OF SYSTEMS:   Review of Systems  Unable to perform ROS: Mental status change    MEDICATIONS AT HOME:   Prior to Admission medications   Medication Sig Start Date End Date Taking? Authorizing Provider  insulin NPH-regular Human (NOVOLIN 70/30) (70-30) 100 UNIT/ML  injection 20 units subcu twice daily Patient taking differently: Inject 12-20 Units into the skin 2 (two) times daily with a meal. Per sliding scale 03/26/18   Katha HammingKonidena, Snehalatha, MD     VITAL SIGNS:  Blood pressure (!) 103/55, pulse (!) 121, temperature 98 F (36.7 C), temperature source Axillary, resp. rate (!) 37, height 5\' 6"  (1.676 m), weight 81.6 kg, last menstrual period 10/23/2013, SpO2 93 %.  PHYSICAL EXAMINATION:  Physical Exam  GENERAL:  38 y.o.-year-old patient lying in the bed , looks critically ill EYES: Pupils equal, round, reactive to light and accommodation. No scleral icterus. Extraocular muscles intact.  HEENT: Head atraumatic, normocephalic. Oropharynx and nasopharynx clear. No oropharyngeal erythema, dry oral mucosa  NECK:  Supple, no jugular venous distention. No thyroid enlargement, no tenderness.  LUNGS: Normal breath sounds bilaterally, no wheezing, rales, rhonchi. No use of accessory muscles of respiration.  CARDIOVASCULAR: S1, S2 .  Tachycardia ABDOMEN: Soft, nontender, nondistended. Bowel sounds present. No organomegaly or mass.  EXTREMITIES: No pedal edema, cyanosis, or clubbing. + 2 pedal & radial pulses b/l.   NEUROLOGIC: Not following instructions PSYCHIATRIC: The patient is drowsy  LABORATORY PANEL:   CBC Recent Labs  Lab 01/13/19 1737  WBC 13.5*  HGB 12.7  HCT 42.3  PLT 255   ------------------------------------------------------------------------------------------------------------------  Chemistries  Recent Labs  Lab 01/13/19 1737  NA 128*  K 6.7*  CL 85*  CO2 9*  GLUCOSE 997*  BUN 62*  CREATININE 1.94*  CALCIUM 8.6*  AST 27  ALT 29  ALKPHOS 132*  BILITOT 1.5*   ------------------------------------------------------------------------------------------------------------------  Cardiac Enzymes No results for input(s): TROPONINI in the last 168  hours. ------------------------------------------------------------------------------------------------------------------  RADIOLOGY:  No results found.   IMPRESSION AND PLAN:   *Severe DKA.  pH of 7.03.  Anion gap of 34 and bicarb 7.  Will add 2 more liters of normal saline bolus stat in addition to 1 L given in the ER.  Continue fluid resuscitation after boluses.  Start insulin drip.  Every 4 BMP.  Accu-Chek every 1 hour.  Admit to ICU.  Patient is critically ill with high risk for deterioration and death.  Will need to transition to long-acting insulin once anion gap improves.  No need to replace potassium.  *Severe hypokalemia secondary to dehydration and acidosis.  Should improve with fluid resuscitation and also insulin should help with intracellular shift.  Potassium falls with appropriate treatment with DKA.  Patient is unable to take any Kayexalate at this time.  Will repeat BMP in an hour.  *Acute kidney injury due to severe dehydration.  Monitor input and output.  Repeat labs.  IV fluid resuscitation underway.  *Acute metabolic encephalopathy.  Monitor closely.  *Pseudohyponatremia.  Should improve as blood glucose improves.  DVT prophylaxis with Lovenox  All the records are reviewed and case discussed with ED provider. Management plans discussed with the patient, family and they are in agreement.  CODE STATUS: Full code  TOTAL CC TIME TAKING CARE OF THIS PATIENT: 80 minutes.   Molinda Bailiff Sharron Simpson M.D on 01/13/2019 at 7:13 PM  Between 7am to 6pm - Pager - [phone]  After 6pm go to [email or web address] - password [email or web address]  [facility]  Office  [phone]  CC: Primary care physician; Patient, No Pcp Per  Note: This dictation was prepared with Dragon dictation along with smaller phrase technology. Any transcriptional errors that result from this process are unintentional.

## 2019-01-13 NOTE — ED Notes (Signed)
ED TO INPATIENT HANDOFF REPORT  ED Nurse Name and Phone #: Brynnlie Unterreiner [phone]  S Name/Age/Gender Kristine Allison 38 y.o. female Room/Bed: ED06A/ED06A  Code Status   Code Status: Full Code  Home/SNF/Other Home  AMS at this time  Is this baseline? NO  Triage Complete: Triage complete  Chief Complaint alt mental status  Triage Note Pt from home via [facility]. Per EMS, pt c/o SHOB, weakness. Per pt she has had similar symptoms "every 6 months" when in "DKA". Pt wearing a NRB at 10L sating at 99% , pt lethargic upon arrival. Per EMS; CGB 564. MD Kinner upon arrival.     Allergies Allergies  Allergen Reactions  . Latex Rash    Level of Care/Admitting Diagnosis ED Disposition    ED Disposition Condition Comment   Admit  [facility] [[id number]]  Level of Care: Stepdown [14]  Covid Evaluation: N/A  Diagnosis: DKA (diabetic ketoacidoses) [facility](HCC) [[id number]][[id number]]  Admitting Physician: Milagros LollSUDINI, SRIKAR [[id number]]  Attending Physician: Milagros LollSUDINI, SRIKAR [[id number]]  Estimated length of stay: past midnight tomorrow  Certification:: I certify this patient will need inpatient services for at least 2 midnights  PT Class (Do Not Modify): Inpatient [101]  PT Acc Code (Do Not Modify): Private [1]       B Medical/Surgery History Past Medical History:  Diagnosis Date  . Anxiety   . Asthma   . Diabetes mellitus without complication (HCC)   . YQMVHQIO(962.9Headache(784.0)    Past Surgical History:  Procedure Laterality Date  . NO PAST SURGERIES       A IV Location/Drains/Wounds Patient Lines/Drains/Airways Status   Active Line/Drains/Airways    Name:   Placement date:   Placement time:   Site:   Days:   Peripheral IV 01/13/19 Left Antecubital   01/13/19    1741    Antecubital   less than 1   Peripheral IV 01/13/19 Left Hand   01/13/19    1844    Hand   less than 1          Intake/Output Last 24 hours  Intake/Output Summary (Last 24 hours) at 01/13/2019 2039 Last data  filed at 01/13/2019 2036 Gross per 24 hour  Intake 3000 ml  Output -  Net 3000 ml    Labs/Imaging Results for orders placed or performed during the hospital encounter of 01/13/19 (from the past 48 hour(s))  Glucose, capillary     Status: Abnormal   Collection Time: 01/13/19  5:35 PM  Result Value Ref Range   Glucose-Capillary >600 (HH) 70 - 99 mg/dL  CBC with Differential     Status: Abnormal   Collection Time: 01/13/19  5:37 PM  Result Value Ref Range   WBC 13.5 (H) 4.0 - 10.5 K/uL   RBC 4.05 3.87 - 5.11 MIL/uL   Hemoglobin 12.7 12.0 - 15.0 g/dL   HCT 52.842.3 41.336.0 - 24.446.0 %   MCV 104.4 (H) 80.0 - 100.0 fL   MCH 31.4 26.0 - 34.0 pg   MCHC 30.0 30.0 - 36.0 g/dL   RDW 01.013.1 27.211.5 - 53.615.5 %   Platelets 255 150 - 400 K/uL   nRBC 0.0 0.0 - 0.2 %   Neutrophils Relative % 80 %   Neutro Abs 10.8 (H) 1.7 - 7.7 K/uL   Lymphocytes Relative 10 %   Lymphs Abs 1.3 0.7 - 4.0 K/uL   Monocytes Relative 9 %   Monocytes Absolute 1.3 (H) 0.1 - 1.0 K/uL   Eosinophils Relative 0 %  Eosinophils Absolute 0.0 0.0 - 0.5 K/uL   Basophils Relative 0 %   Basophils Absolute 0.0 0.0 - 0.1 K/uL   Immature Granulocytes 1 %   Abs Immature Granulocytes 0.11 (H) 0.00 - 0.07 K/uL    Comment: Performed at [facility], [address] [place], [place] [address]  Comprehensive metabolic panel     Status: Abnormal   Collection Time: 01/13/19  5:37 PM  Result Value Ref Range   Sodium 128 (L) 135 - 145 mmol/L    Comment: LYTES REPEATED  MLK   Potassium 6.7 (HH) 3.5 - 5.1 mmol/L    Comment: CRITICAL RESULT CALLED TO, READ BACK BY AND VERIFIED WITH Latonga Ponder 01/13/19 @ 1825  MLK    Chloride 85 (L) 98 - 111 mmol/L   CO2 9 (L) 22 - 32 mmol/L   Glucose, Bld 997 (HH) 70 - 99 mg/dL    Comment: CRITICAL RESULT CALLED TO, READ BACK BY AND VERIFIED WITH Hanin Decook 01/13/19 @ 1825  MLK    BUN 62 (H) 6 - 20 mg/dL   Creatinine, Ser 4.69 (H) 0.44 - 1.00 mg/dL   Calcium 8.6 (L) 8.9 - 10.3 mg/dL   Total  Protein 7.2 6.5 - 8.1 g/dL   Albumin 4.1 3.5 - 5.0 g/dL   AST 27 15 - 41 U/L   ALT 29 0 - 44 U/L   Alkaline Phosphatase 132 (H) 38 - 126 U/L   Total Bilirubin 1.5 (H) 0.3 - 1.2 mg/dL   GFR calc non Af Amer 33 (L) >60 mL/min   GFR calc Af Amer 38 (L) >60 mL/min   Anion gap 34 (H) 5 - 15    Comment: Performed at [facility], [address] [place], [place] [address]  Lipase, blood     Status: None   Collection Time: 01/13/19  5:37 PM  Result Value Ref Range   Lipase 17 11 - 51 U/L    Comment: Performed at [facility], [address] [place], [place] [address]  Urinalysis, Complete w Microscopic     Status: Abnormal   Collection Time: 01/13/19  5:37 PM  Result Value Ref Range   Color, Urine YELLOW (A) YELLOW   APPearance HAZY (A) CLEAR   Specific Gravity, Urine 1.017 1.005 - 1.030   pH 5.0 5.0 - 8.0   Glucose, UA >=500 (A) NEGATIVE mg/dL   Hgb urine dipstick SMALL (A) NEGATIVE   Bilirubin Urine NEGATIVE NEGATIVE   Ketones, ur 20 (A) NEGATIVE mg/dL   Protein, ur NEGATIVE NEGATIVE mg/dL   Nitrite NEGATIVE NEGATIVE   Leukocytes,Ua NEGATIVE NEGATIVE   RBC / HPF 0-5 0 - 5 RBC/hpf   WBC, UA 0-5 0 - 5 WBC/hpf   Bacteria, UA RARE (A) NONE SEEN   Squamous Epithelial / LPF 0-5 0 - 5   Mucus PRESENT    Waxy Casts, UA 1    Amorphous Crystal PRESENT     Comment: Performed at [facility], [address] [place], [place] [address]  Blood gas, venous     Status: Abnormal   Collection Time: 01/13/19  5:37 PM  Result Value Ref Range   FIO2 0.21    Delivery systems ROOM AIR    pH, Ven 7.03 (LL) 7.250 - 7.430    Comment: CRITICAL RESULT CALLED TO, READ BACK BY AND VERIFIED WITH: JESSICA RN AT 1807 ON 01/13/19    pCO2, Ven 36 (L) 44.0 - 60.0 mmHg   pO2, Ven  63.0 (H) 32.0 - 45.0 mmHg   Bicarbonate 9.5 (L) 20.0 - 28.0 mmol/L   Acid-base deficit 20.5 (H) 0.0 - 2.0 mmol/L   O2 Saturation 77.6 %   Patient temperature 37.0    Collection site VENOUS    Sample  type VENOUS     Comment: Performed at [facility], [address] [place], [place] [address]  Glucose, capillary     Status: Abnormal   Collection Time: 01/13/19  6:46 PM  Result Value Ref Range   Glucose-Capillary >600 (HH) 70 - 99 mg/dL  Glucose, capillary     Status: Abnormal   Collection Time: 01/13/19  7:53 PM  Result Value Ref Range   Glucose-Capillary >600 (HH) 70 - 99 mg/dL   Dg Chest Port 1 View  Result Date: 01/13/2019 CLINICAL DATA:  Weakness and altered mental status. EXAM: PORTABLE CHEST 1 VIEW COMPARISON:  06/11/2018. FINDINGS: The heart size and mediastinal contours are within normal limits. Both lungs are clear. The visualized skeletal structures are unremarkable. IMPRESSION: No active disease.  Stable exam. Electronically Signed   By: Elsie Stain M.D.   On: 01/13/2019 19:17    Pending Labs Unresulted Labs (From admission, onward)    Start     Ordered   01/20/19 0500  Creatinine, serum  (enoxaparin (LOVENOX)    CrCl >/= 30 ml/min)  Weekly,   STAT    Comments:  while on enoxaparin therapy    01/13/19 1912   01/14/19 0500  CBC  Tomorrow morning,   STAT     01/13/19 1912   01/13/19 2030  Basic metabolic panel  STAT Now then every 4 hours ,   STAT     01/13/19 1910   01/13/19 2027  Pregnancy, urine  ONCE - STAT,   STAT     01/13/19 2026   01/13/19 1911  Urine rapid drug screen (hosp performed)not at [facility]  Add-on,   AD     01/13/19 1910   01/13/19 1911  Hemoglobin A1c  Add-on,   AD     01/13/19 1910          Vitals/Pain Today's Vitals   01/13/19 1950 01/13/19 2000 01/13/19 2010 01/13/19 2030  BP: 125/65 118/68 115/69 127/75  Pulse: (!) 112 (!) 116 (!) 117 (!) 112  Resp: 17 (!) 23 (!) 24 (!) 23  Temp:      TempSrc:      SpO2: 97% 94% 98% 96%  Weight:      Height:        Isolation Precautions No active isolations  Medications Medications  insulin regular, human (MYXREDLIN) 100 units/ 100 mL infusion (10.8 Units/hr Intravenous Rate/Dose  Change 01/13/19 1950)  0.9 %  sodium chloride infusion (has no administration in time range)  0.9 %  sodium chloride infusion (has no administration in time range)  0.9 %  sodium chloride infusion (has no administration in time range)  dextrose 5 %-0.45 % sodium chloride infusion (has no administration in time range)  enoxaparin (LOVENOX) injection 40 mg (has no administration in time range)  acetaminophen (TYLENOL) tablet 650 mg (has no administration in time range)    Or  acetaminophen (TYLENOL) suppository 650 mg (has no administration in time range)  polyethylene glycol (MIRALAX / GLYCOLAX) packet 17 g (has no administration in time range)  ondansetron (ZOFRAN) tablet 4 mg (has no administration in time range)    Or  ondansetron (ZOFRAN) injection 4 mg (has no administration in time range)  0.9 %  sodium chloride infusion (0 mLs Intravenous Stopped 01/13/19 1857)  sodium chloride 0.9 % bolus 2,000 mL (0 mLs Intravenous Stopped 01/13/19 2036)    Mobility Pt unable to ambulate.  High fall risk   Focused Assessments    R Recommendations: See Admitting Provider Note  Report given to:   Additional Notes: Pt is on 4L via [place], sating at 96% at this time.

## 2019-01-14 ENCOUNTER — Inpatient Hospital Stay: Payer: Self-pay

## 2019-01-14 LAB — GLUCOSE, CAPILLARY
Glucose-Capillary: 266 mg/dL — ABNORMAL HIGH (ref 70–99)
Glucose-Capillary: 347 mg/dL — ABNORMAL HIGH (ref 70–99)
Glucose-Capillary: 228 mg/dL — ABNORMAL HIGH (ref 70–99)
Glucose-Capillary: 188 mg/dL — ABNORMAL HIGH (ref 70–99)
Glucose-Capillary: 165 mg/dL — ABNORMAL HIGH (ref 70–99)
Glucose-Capillary: 148 mg/dL — ABNORMAL HIGH (ref 70–99)
Glucose-Capillary: 121 mg/dL — ABNORMAL HIGH (ref 70–99)
Glucose-Capillary: 84 mg/dL (ref 70–99)
Glucose-Capillary: 92 mg/dL (ref 70–99)
Glucose-Capillary: 117 mg/dL — ABNORMAL HIGH (ref 70–99)
Glucose-Capillary: 155 mg/dL — ABNORMAL HIGH (ref 70–99)

## 2019-01-14 LAB — HEMOGLOBIN A1C
Hgb A1c MFr Bld: 10.5 % — ABNORMAL HIGH (ref 4.8–5.6)
Hgb A1c MFr Bld: 10.9 % — ABNORMAL HIGH (ref 4.8–5.6)
Mean Plasma Glucose: 254.65 mg/dL
Mean Plasma Glucose: 266.13 mg/dL

## 2019-01-14 LAB — BASIC METABOLIC PANEL
Anion gap: 9 (ref 5–15)
Anion gap: 8 (ref 5–15)
BUN: 42 mg/dL — ABNORMAL HIGH (ref 6–20)
BUN: 40 mg/dL — ABNORMAL HIGH (ref 6–20)
CO2: 20 mmol/L — ABNORMAL LOW (ref 22–32)
CO2: 21 mmol/L — ABNORMAL LOW (ref 22–32)
Calcium: 7.4 mg/dL — ABNORMAL LOW (ref 8.9–10.3)
Calcium: 7.6 mg/dL — ABNORMAL LOW (ref 8.9–10.3)
Chloride: 115 mmol/L — ABNORMAL HIGH (ref 98–111)
Chloride: 115 mmol/L — ABNORMAL HIGH (ref 98–111)
Creatinine, Ser: 1 mg/dL (ref 0.44–1.00)
Creatinine, Ser: 0.95 mg/dL (ref 0.44–1.00)
GFR calc Af Amer: >60 mL/min (ref >60)
GFR calc Af Amer: >60 mL/min (ref >60)
GFR calc non Af Amer: >60 mL/min (ref >60)
GFR calc non Af Amer: >60 mL/min (ref >60)
Glucose, Bld: 232 mg/dL — ABNORMAL HIGH (ref 70–99)
Glucose, Bld: 167 mg/dL — ABNORMAL HIGH (ref 70–99)
Potassium: 4.7 mmol/L (ref 3.5–5.1)
Potassium: 4.9 mmol/L (ref 3.5–5.1)
Sodium: 144 mmol/L (ref 135–145)
Sodium: 144 mmol/L (ref 135–145)

## 2019-01-14 LAB — CBC
HCT: 34.2 % — ABNORMAL LOW (ref 36.0–46.0)
Hemoglobin: 11.2 g/dL — ABNORMAL LOW (ref 12.0–15.0)
MCH: 30.8 pg (ref 26.0–34.0)
MCHC: 32.7 g/dL (ref 30.0–36.0)
MCV: 94 fL (ref 80.0–100.0)
Platelets: 177 10*3/uL (ref 150–400)
RBC: 3.64 MIL/uL — ABNORMAL LOW (ref 3.87–5.11)
RDW: 12.8 % (ref 11.5–15.5)
WBC: 10.7 10*3/uL — ABNORMAL HIGH (ref 4.0–10.5)
nRBC: 0 % (ref 0.0–0.2)

## 2019-01-14 MED ORDER — LACTATED RINGERS IV SOLN
INTRAVENOUS | Status: DC
  Administered 2019-01-14: 06:00:00 via INTRAVENOUS

## 2019-01-14 MED ORDER — SODIUM CHLORIDE 0.9 % IV SOLN: INTRAVENOUS | Status: DC

## 2019-01-14 MED ORDER — INSULIN GLARGINE 100 UNIT/ML ~~LOC~~ SOLN
24.0000 [IU] | Freq: Every day | SUBCUTANEOUS | Status: DC
  Administered 2019-01-14: 24 [IU] via SUBCUTANEOUS
  Filled 2019-01-14: qty 0.24

## 2019-01-14 MED ORDER — INSULIN GLARGINE 100 UNIT/ML ~~LOC~~ SOLN
16.0000 [IU] | Freq: Every day | SUBCUTANEOUS | Status: DC
  Administered 2019-01-15: 09:00:00 16 [IU] via SUBCUTANEOUS
  Filled 2019-01-14: qty 0.16

## 2019-01-14 MED ORDER — INSULIN ASPART 100 UNIT/ML ~~LOC~~ SOLN
0.0000 [IU] | Freq: Three times a day (TID) | SUBCUTANEOUS | Status: DC
  Administered 2019-01-15: 17:00:00 1 [IU] via SUBCUTANEOUS
  Administered 2019-01-15: 08:00:00 5 [IU] via SUBCUTANEOUS
  Administered 2019-01-15: 13:00:00 3 [IU] via SUBCUTANEOUS
  Administered 2019-01-16: 18:00:00 2 [IU] via SUBCUTANEOUS
  Administered 2019-01-16: 09:00:00 1 [IU] via SUBCUTANEOUS
  Administered 2019-01-17: 7 [IU] via SUBCUTANEOUS
  Administered 2019-01-17 (×2): 3 [IU] via SUBCUTANEOUS
  Administered 2019-01-18: 09:00:00 5 [IU] via SUBCUTANEOUS
  Administered 2019-01-18: 12:00:00 2 [IU] via SUBCUTANEOUS
  Filled 2019-01-14 (×10): qty 1

## 2019-01-14 MED ORDER — INSULIN ASPART 100 UNIT/ML ~~LOC~~ SOLN: 0.0000 [IU] | Freq: Three times a day (TID) | SUBCUTANEOUS | Status: DC

## 2019-01-14 MED ORDER — SODIUM CHLORIDE 0.9 % IV SOLN
INTRAVENOUS | Status: DC
  Administered 2019-01-14 – 2019-01-15 (×2): via INTRAVENOUS

## 2019-01-14 MED ORDER — INSULIN ASPART 100 UNIT/ML ~~LOC~~ SOLN
0.0000 [IU] | Freq: Every day | SUBCUTANEOUS | Status: DC
  Administered 2019-01-17: 2 [IU] via SUBCUTANEOUS
  Filled 2019-01-14: qty 1

## 2019-01-14 MED ORDER — IPRATROPIUM-ALBUTEROL 0.5-2.5 (3) MG/3ML IN SOLN
3.0000 mL | Freq: Four times a day (QID) | RESPIRATORY_TRACT | Status: DC
  Administered 2019-01-15: 08:00:00 3 mL via RESPIRATORY_TRACT
  Filled 2019-01-14 (×2): qty 3

## 2019-01-14 NOTE — Progress Notes (Signed)
SOUND Physicians - [place] at [facility]   PATIENT NAME: Kristine Allison    MR#:  [id number]  DATE OF BIRTH:  12/09/1980  SUBJECTIVE:  CHIEF COMPLAINT:   Chief Complaint  Patient presents with  . Weakness  . Shortness of Breath  . Altered Mental Status  Patient seen today Patient transferred out of the ICU to the floor Off insulin drip Patient to start diet Has generalized weakness  REVIEW OF SYSTEMS:    ROS  CONSTITUTIONAL: No documented fever. Has fatigue, weakness. No weight gain, no weight loss.  EYES: No blurry or double vision.  ENT: No tinnitus. No postnasal drip. No redness of the oropharynx.  RESPIRATORY: No cough, no wheeze, no hemoptysis. No dyspnea.  CARDIOVASCULAR: No chest pain. No orthopnea. No palpitations. No syncope.  GASTROINTESTINAL: Decreased nausea, no vomiting or diarrhea. No abdominal pain. No melena or hematochezia.  GENITOURINARY: No dysuria or hematuria.  ENDOCRINE: No polyuria or nocturia. No heat or cold intolerance.  HEMATOLOGY: No anemia. No bruising. No bleeding.  INTEGUMENTARY: No rashes. No lesions.  MUSCULOSKELETAL: No arthritis. No swelling. No gout.  NEUROLOGIC: No numbness, tingling, or ataxia. No seizure-type activity.  PSYCHIATRIC: No anxiety. No insomnia. No ADD.   DRUG ALLERGIES:   Allergies  Allergen Reactions  . Latex Rash    VITALS:  Blood pressure 101/67, pulse 92, temperature 97.8 F (36.6 C), temperature source Oral, resp. rate 18, height 5\' 6"  (1.676 m), weight 55.6 kg, last menstrual period 10/23/2013, SpO2 99 %.  PHYSICAL EXAMINATION:   Physical Exam  GENERAL:  38 y.o.-year-old patient lying in the bed with no acute distress.  EYES: Pupils equal, round, reactive to light and accommodation. No scleral icterus. Extraocular muscles intact.  HEENT: Head atraumatic, normocephalic. Oropharynx dry and nasopharynx clear.  NECK:  Supple, no jugular venous distention. No thyroid enlargement, no  tenderness.  LUNGS: Normal breath sounds bilaterally, no wheezing, rales, rhonchi. No use of accessory muscles of respiration.  CARDIOVASCULAR: S1, S2 normal. No murmurs, rubs, or gallops.  ABDOMEN: Soft, nontender, nondistended. Bowel sounds present. No organomegaly or mass.  EXTREMITIES: No cyanosis, clubbing or edema b/l.    NEUROLOGIC: Cranial nerves II through XII are intact. No focal Motor or sensory deficits b/l.   PSYCHIATRIC: The patient is alert and oriented x 3.  SKIN: No obvious rash, lesion, or ulcer.   LABORATORY PANEL:   CBC Recent Labs  Lab 01/14/19 0505  WBC 10.7*  HGB 11.2*  HCT 34.2*  PLT 177   ------------------------------------------------------------------------------------------------------------------ Chemistries  Recent Labs  Lab 01/13/19 1737  01/14/19 0505  NA 128*   < > 144  K 6.7*   < > 4.9  CL 85*   < > 115*  CO2 9*   < > 21*  GLUCOSE 997*   < > 167*  BUN 62*   < > 40*  CREATININE 1.94*   < > 0.95  CALCIUM 8.6*   < > 7.6*  AST 27  --   --   ALT 29  --   --   ALKPHOS 132*  --   --   BILITOT 1.5*  --   --    < > = values in this interval not displayed.   ------------------------------------------------------------------------------------------------------------------  Cardiac Enzymes No results for input(s): TROPONINI in the last 168 hours. ------------------------------------------------------------------------------------------------------------------  RADIOLOGY:  Dg Chest Port 1 View  Result Date: 01/13/2019 CLINICAL DATA:  Weakness and altered mental status. EXAM: PORTABLE CHEST 1 VIEW COMPARISON:  06/11/2018.  FINDINGS: The heart size and mediastinal contours are within normal limits. Both lungs are clear. The visualized skeletal structures are unremarkable. IMPRESSION: No active disease.  Stable exam. Electronically Signed   By: Elsie StainJohn T Curnes M.D.   On: 01/13/2019 19:17     ASSESSMENT AND PLAN:  38 year old female patient with  history of type 1 diabetes mellitus, bronchial asthma, anxiety disorder was in the ICU for DKA.  Currently transferred to medical floor.  -Diabetic ketoacidosis improving Off insulin drip Lantus insulin sliding scale coverage as per diabetic coordinator recommendations Start diabetic diet  -Dehydration IV fluids  -Hypokalemia Improved  -Acute metabolic encephalopathy improved  -Pseudohyponatremia secondary to hyperglycemia improved  -Acute kidney injury Improved with IV fluids  -Tobacco abuse Tobacco cessation counseled to the patient for 6 minutes Nicotine patch offered  All the records are reviewed and case discussed with Care Management/Social Worker. Management plans discussed with the patient, family and they are in agreement.  CODE STATUS: Full code  DVT Prophylaxis: SCDs  TOTAL TIME TAKING CARE OF THIS PATIENT: 35 minutes.   POSSIBLE D/C IN 1 to 2 DAYS, DEPENDING ON CLINICAL CONDITION.  Ihor AustinPavan Ignatz Deis M.D on 01/14/2019 at 12:36 PM  Between 7am to 6pm - Pager - [phone]  After 6pm go to [email or web address] - password [email or web address]  [facility] [place] [facility]  Office  [phone]  CC: Primary care physician; Patient, No Pcp Per  Note: This dictation was prepared with Dragon dictation along with smaller phrase technology. Any transcriptional errors that result from this process are unintentional.

## 2019-01-14 NOTE — Progress Notes (Signed)
Attempted to call patient several times throughout day to discuss DM management with her.  She has not answered the phone.  RN states she has been sleeping most of the day but states she will alert patient that I am trying to call and place phone by patient.   Thanks,  Beryl Meager, RN, BC-ADM Inpatient Diabetes Coordinator Pager [phone]

## 2019-01-14 NOTE — Plan of Care (Signed)
Pt transferred today from ICU.  VSS.  CBG WDL.  Poor appetite. Pt vomited once, zofran given.  C/o chronic back pain tylenol given.  Pt withdrawn, flat affect. Slept most of the shift. Encouraged pt to get oob to recliner to assist with back pain, but pt declined.

## 2019-01-14 NOTE — Progress Notes (Signed)
Lung sounds with bilateral rhonchi, sats 100% on 2L, non- productive cough. Received verbal order for chest x ray from Raliegh Scarlet NP. Will continue to monitor.

## 2019-01-14 NOTE — Progress Notes (Signed)
Inpatient Diabetes Program Recommendations  AACE/ADA: New Consensus Statement on Inpatient Glycemic Control (2015)  Target Ranges:  Prepandial:   less than 140 mg/dL      Peak postprandial:   less than 180 mg/dL (1-2 hours)      Critically ill patients:  140 - 180 mg/dL   Lab Results  Component Value Date   GLUCAP 84 01/14/2019   HGBA1C 10.5 (H) 01/13/2019    Review of Glycemic Control Results for Kristine Allison, Kristine Allison (MRN [id number]) as of 01/14/2019 09:30  Ref. Range 01/14/2019 00:10 01/14/2019 01:12 01/14/2019 02:14 01/14/2019 03:08 01/14/2019 04:00 01/14/2019 05:20 01/14/2019 06:15 01/14/2019 07:47  Glucose-Capillary Latest Ref Range: 70 - 99 mg/dL 549 (H) 826 (H) 415 (H) 188 (H) 165 (H) 148 (H) 121 (H) 84   Diabetes history: Type 1 DM Outpatient Diabetes medications:  70/30 12-20 units bid Current orders for Inpatient glycemic control:  Novolog moderate tid with meals and HS Lantus 24 units daily Inpatient Diabetes Program Recommendations:     Note patient was on 70/30 prior to admit.  Unclear what precipitated event.  Last admission for DKA was 08/07/18.    Please consider reducing Lantus to 16 units daily.  Also consider reducing Novolog correction to sensitive tid with meals and HS.  Once eating, patient will also likely need meal coverage 3 units tid with meals (hold if patient eats less than 50%).    Upon discharge patient will likely need to resume 70/30 due to cost.    Thanks,  Beryl Meager, RN, BC-ADM Inpatient Diabetes Coordinator Pager [phone] (8a-5p)

## 2019-01-14 NOTE — Progress Notes (Addendum)
Pt has remained alert and oriented with no c/o pain. Pt is lethargic, but will open eyes to voice and maintain eye contact. Pt transitioned to RA from [facility]. Pt dropped sats to 84%-> 2LNC reapplied with SpO2 > 90%. Pt has a strong, non-productive, conjested cough. Lung sounds with bilat rhonchi-diminished in bilat lower lobes. Pt has remained in NSR. BP/HR WNL.  Previous shift reported nausea with Zofran given x1. Pt did not want breakfast this am. 0800 cbg 84. 03/2018 A1C 12.6 -> A1C ordered.  Mother called and updated regarding pt transfer to IC. Report given to [facility], Charity fundraiser.

## 2019-01-14 NOTE — Progress Notes (Signed)
Inpatient Diabetes Program Recommendations  AACE/ADA: New Consensus Statement on Inpatient Glycemic Control (2015)  Target Ranges:  Prepandial:   less than 140 mg/dL      Peak postprandial:   less than 180 mg/dL (1-2 hours)      Critically ill patients:  140 - 180 mg/dL   Lab Results  Component Value Date   GLUCAP 92 01/14/2019   HGBA1C 10.9 (H) 01/14/2019     Spoke with patient.  She states that she was taking her insulin prior to admit.  She takes Novolin 70/30 10 units tid with meals (states she buys from [place]).  She does not have a PCP and states that she does not check her blood sugars prior to giving insulin.  Her A1C is improved from last check, however still>10%.  Reviewed with patient that her A1C is greater than goal and she needs PCP.  Will place care management consult for help with outpatient PCP.  Patient c/o pain in her back which she thinks may have made her blood sugars higher.  She states that she has told RN about her pain.  Encouraged patient to take care of her DM. She knows to get insulin from [facility] and strips.  No further needs at this time.   Thanks, Beryl Meager, RN, BC-ADM Inpatient Diabetes Coordinator Pager [phone]

## 2019-01-14 NOTE — Progress Notes (Signed)
Advanced care plan. Purpose of the Encounter: CODE STATUS Parties in Attendance: Patient Patient's Decision Capacity: Good Subjective/Patient's story: Kristine Allison  is a 38 y.o. female with a known history of diabetes mellitus, recurrent admissions for DKA, anxiety, asthma presents to the emergency room with feeling extremely weak, blood sugars in the 900s and severe DKA.  Patient is not providing much history.  Wakes up on calling her name and then falls back to sleep.  Here her venous pH was 7.03 with anion gap 34 and bicarb of 9.  She has been given 1 L normal saline and started on insulin drip. Objective/Medical story Patient went presented to the emergency room had elevated anion gap and was in ketoacidosis.  Blood sugars were high started on insulin drip and needs IV fluids aggressively and blood sugar control.  Needs potassium supplementation.  Active tobacco user needs tobacco cessation counseling. Goals of care determination:  Advance care directives goals of care and treatment plan discussed. For now patient wants everything done which includes CPR, intubation ventilator the need arises. CODE STATUS: Full code Time spent discussing advanced care planning: 16 minutes

## 2019-01-15 ENCOUNTER — Other Ambulatory Visit: Payer: Self-pay

## 2019-01-15 LAB — BASIC METABOLIC PANEL
Anion gap: 13 (ref 5–15)
BUN: 24 mg/dL — ABNORMAL HIGH (ref 6–20)
CO2: 22 mmol/L (ref 22–32)
Calcium: 7.8 mg/dL — ABNORMAL LOW (ref 8.9–10.3)
Chloride: 104 mmol/L (ref 98–111)
Creatinine, Ser: 0.66 mg/dL (ref 0.44–1.00)
GFR calc Af Amer: >60 mL/min (ref >60)
GFR calc non Af Amer: >60 mL/min (ref >60)
Glucose, Bld: 237 mg/dL — ABNORMAL HIGH (ref 70–99)
Potassium: 4.3 mmol/L (ref 3.5–5.1)
Sodium: 139 mmol/L (ref 135–145)

## 2019-01-15 LAB — CBC
HCT: 37.6 % (ref 36.0–46.0)
Hemoglobin: 12.3 g/dL (ref 12.0–15.0)
MCH: 31.3 pg (ref 26.0–34.0)
MCHC: 32.7 g/dL (ref 30.0–36.0)
MCV: 95.7 fL (ref 80.0–100.0)
Platelets: 157 10*3/uL (ref 150–400)
RBC: 3.93 MIL/uL (ref 3.87–5.11)
RDW: 13.1 % (ref 11.5–15.5)
WBC: 6.7 10*3/uL (ref 4.0–10.5)
nRBC: 0 % (ref 0.0–0.2)

## 2019-01-15 LAB — GLUCOSE, CAPILLARY
Glucose-Capillary: 218 mg/dL — ABNORMAL HIGH (ref 70–99)
Glucose-Capillary: 294 mg/dL — ABNORMAL HIGH (ref 70–99)
Glucose-Capillary: 222 mg/dL — ABNORMAL HIGH (ref 70–99)
Glucose-Capillary: 149 mg/dL — ABNORMAL HIGH (ref 70–99)
Glucose-Capillary: 63 mg/dL — ABNORMAL LOW (ref 70–99)
Glucose-Capillary: 133 mg/dL — ABNORMAL HIGH (ref 70–99)

## 2019-01-15 MED ORDER — SODIUM CHLORIDE 0.9% FLUSH
3.0000 mL | INTRAVENOUS | Status: DC | PRN
  Administered 2019-01-16: 09:00:00 3 mL via INTRAVENOUS
  Filled 2019-01-15: qty 3

## 2019-01-15 MED ORDER — PROMETHAZINE HCL 25 MG/ML IJ SOLN
12.5000 mg | Freq: Once | INTRAMUSCULAR | Status: AC
  Administered 2019-01-16: 04:00:00 12.5 mg via INTRAVENOUS

## 2019-01-15 MED ORDER — METOCLOPRAMIDE HCL 5 MG/ML IJ SOLN
10.0000 mg | Freq: Three times a day (TID) | INTRAMUSCULAR | Status: DC
  Administered 2019-01-15 – 2019-01-16 (×3): 10 mg via INTRAVENOUS
  Filled 2019-01-15 (×3): qty 2

## 2019-01-15 MED ORDER — INSULIN GLARGINE 100 UNIT/ML ~~LOC~~ SOLN
24.0000 [IU] | Freq: Every day | SUBCUTANEOUS | Status: DC
  Filled 2019-01-15: qty 0.24

## 2019-01-15 MED ORDER — ORAL CARE MOUTH RINSE: 15.0000 mL | Freq: Two times a day (BID) | OROMUCOSAL | Status: DC

## 2019-01-15 MED ORDER — SODIUM CHLORIDE 0.9% FLUSH
3.0000 mL | Freq: Two times a day (BID) | INTRAVENOUS | Status: DC
  Administered 2019-01-15 – 2019-01-17 (×5): 3 mL via INTRAVENOUS

## 2019-01-15 MED ORDER — INSULIN GLARGINE 100 UNIT/ML ~~LOC~~ SOLN
5.0000 [IU] | Freq: Once | SUBCUTANEOUS | Status: AC
  Administered 2019-01-15: 16:00:00 5 [IU] via SUBCUTANEOUS
  Filled 2019-01-15: qty 0.05

## 2019-01-15 MED ORDER — SERTRALINE HCL 50 MG PO TABS
25.0000 mg | ORAL_TABLET | Freq: Every day | ORAL | Status: DC
  Administered 2019-01-15 – 2019-01-18 (×4): 25 mg via ORAL
  Filled 2019-01-15 (×4): qty 1

## 2019-01-15 NOTE — Progress Notes (Addendum)
[employer] Phys[employer] - [place] at [facility]   PATIENT NAME: Kristine Allison    MR#:  [id number]  DATE OF BIRTH:  Jan 26, 1981  SUBJECTIVE:  CHIEF COMPLAINT:   Chief Complaint  Patient presents with  . Weakness  . Shortness of Breath  . Altered Mental Status  DKA resolved.  She continued to have significant nausea and not able to eat much.  Blood sugar was slightly elevated in the range of 200.  REVIEW OF SYSTEMS:    ROS  CONSTITUTIONAL: No documented fever. Has fatigue, weakness. No weight gain, no weight loss.  EYES: No blurry or double vision.  ENT: No tinnitus. No postnasal drip. No redness of the oropharynx.  RESPIRATORY: No cough, no wheeze, no hemoptysis. No dyspnea.  CARDIOVASCULAR: No chest pain. No orthopnea. No palpitations. No syncope.  GASTROINTESTINAL: Have nausea, no vomiting or diarrhea. No abdominal pain. No melena or hematochezia.  GENITOURINARY: No dysuria or hematuria.  ENDOCRINE: No polyuria or nocturia. No heat or cold intolerance.  HEMATOLOGY: No anemia. No bruising. No bleeding.  INTEGUMENTARY: No rashes. No lesions.  MUSCULOSKELETAL: No arthritis. No swelling. No gout.  NEUROLOGIC: No numbness, tingling, or ataxia. No seizure-type activity.  PSYCHIATRIC: No anxiety. No insomnia. No ADD.   DRUG ALLERGIES:   Allergies  Allergen Reactions  . Latex Rash    VITALS:  Blood pressure 136/85, pulse 72, temperature 98 F (36.7 C), temperature source Oral, resp. rate 18, height 5\' 6"  (1.676 m), weight 56.4 kg, last menstrual period 10/23/2013, SpO2 94 %.  PHYSICAL EXAMINATION:   Physical Exam  GENERAL:  38 y.o.-year-old patient lying in the bed with no acute distress.  EYES: Pupils equal, round, reactive to light and accommodation. No scleral icterus. Extraocular muscles intact.  HEENT: Head atraumatic, normocephalic. Oropharynx dry and nasopharynx clear.  NECK:  Supple, no jugular venous distention. No thyroid enlargement, no tenderness.   LUNGS: Normal breath sounds bilaterally, no wheezing, rales, rhonchi. No use of accessory muscles of respiration.  CARDIOVASCULAR: S1, S2 normal. No murmurs, rubs, or gallops.  ABDOMEN: Soft, nontender, nondistended. Bowel sounds present. No organomegaly or mass.  EXTREMITIES: No cyanosis, clubbing or edema b/l.    NEUROLOGIC: Cranial nerves II through XII are intact. No focal Motor or sensory deficits b/l.   PSYCHIATRIC: The patient is alert and oriented x 3.  Flat affect. SKIN: No obvious rash, lesion, or ulcer.   LABORATORY PANEL:   CBC Recent Labs  Lab 01/15/19 0352  WBC 6.7  HGB 12.3  HCT 37.6  PLT 157   ------------------------------------------------------------------------------------------------------------------ Chemistries  Recent Labs  Lab 01/13/19 1737  01/15/19 0352  NA 128*   < > 139  K 6.7*   < > 4.3  CL 85*   < > 104  CO2 9*   < > 22  GLUCOSE 997*   < > 237*  BUN 62*   < > 24*  CREATININE 1.94*   < > 0.66  CALCIUM 8.6*   < > 7.8*  AST 27  --   --   ALT 29  --   --   ALKPHOS 132*  --   --   BILITOT 1.5*  --   --    < > = values in this interval not displayed.   ------------------------------------------------------------------------------------------------------------------  Cardiac Enzymes No results for input(s): TROPONINI in the last 168 hours. ------------------------------------------------------------------------------------------------------------------  RADIOLOGY:  Dg Chest 1 View  Result Date: 01/14/2019 CLINICAL DATA:  Nonproductive cough with bilateral rhonchi. History of asthma  and diabetes. EXAM: CHEST  1 VIEW COMPARISON:  Radiographs 01/13/2019 and 06/11/2018. FINDINGS: 2024 hours. The heart size and mediastinal contours are normal. The lungs are clear. There is no pleural effusion or pneumothorax. No acute osseous findings are identified. IMPRESSION: Stable chest.  No active cardiopulmonary process. Electronically Signed   By: Carey BullocksWilliam   Veazey M.D.   On: 01/14/2019 20:46   Dg Chest Port 1 View  Result Date: 01/13/2019 CLINICAL DATA:  Weakness and altered mental status. EXAM: PORTABLE CHEST 1 VIEW COMPARISON:  06/11/2018. FINDINGS: The heart size and mediastinal contours are within normal limits. Both lungs are clear. The visualized skeletal structures are unremarkable. IMPRESSION: No active disease.  Stable exam. Electronically Signed   By: Elsie StainJohn T Curnes M.D.   On: 01/13/2019 19:17     ASSESSMENT AND PLAN:  38 year old female patient with history of type 1 diabetes mellitus, bronchial asthma, anxiety disorder was in the ICU for DKA.  Currently transferred to medical floor.  -Diabetic ketoacidosis- improved Off insulin drip Lantus insulin sliding scale coverage as per diabetic coordinator recommendations Started diabetic diet  -Dehydration IV fluids  -Hypokalemia Improved  -Acute metabolic encephalopathy improved  -Depression-patient appears with flat affect.  I will start on small dose Zoloft.  -Persistent nausea-possibly diabetic gastroparesis-will start on Reglan.  -Pseudohyponatremia secondary to hyperglycemia improved  -Acute kidney injury Improved with IV fluids  -Tobacco abuse Tobacco cessation counseled to the patient for 6 minutes Nicotine patch offered  All the records are reviewed and case discussed with Care Management/Social Worker. Management plans discussed with the patient, family and they are in agreement.  CODE STATUS: Full code  DVT Prophylaxis: SCDs  TOTAL TIME TAKING CARE OF THIS PATIENT: 35 minutes.   POSSIBLE D/C IN 1 to 2 DAYS, DEPENDING ON CLINICAL CONDITION.  Altamese DillingVaibhavkumar Keishana Klinger M.D on 01/15/2019 at 4:24 PM  Between 7am to 6pm - Pager - [phone]  After 6pm go to [email or web address] - password [email or web address]  SOUND  Hospitalists  Office  [phone]  CC: Primary care physician; Patient, No Pcp Per  Note: This dictation was prepared with Dragon dictation along  with smaller phrase technology. Any transcriptional errors that result from this process are unintentional.

## 2019-01-15 NOTE — Plan of Care (Signed)
  Problem: Education: Goal: Knowledge of General Education information will improve Description Including pain rating scale, medication(s)/side effects and non-pharmacologic comfort measures Outcome: Progressing   Problem: Clinical Measurements: Goal: Ability to maintain clinical measurements within normal limits will improve Outcome: Progressing   Problem: Nutrition: Goal: Adequate nutrition will be maintained Outcome: Progressing   Problem: Pain Managment: Goal: General experience of comfort will improve Outcome: Progressing   Problem: Safety: Goal: Ability to remain free from injury will improve Outcome: Progressing   Problem: Fluid Volume: Goal: Ability to maintain a balanced intake and output will improve Outcome: Progressing   Problem: Metabolic: Goal: Ability to maintain appropriate glucose levels will improve Outcome: Progressing   Problem: Nutritional: Goal: Maintenance of adequate nutrition will improve Outcome: Progressing

## 2019-01-15 NOTE — Progress Notes (Signed)
02 sats at 88% on RA, 2L applied per [place], sats up to 94%.

## 2019-01-15 NOTE — Progress Notes (Signed)
Patient complaining of nausea, requesting phenergan, received a verbal order from Janeann Merl NP for a one time dose of 12.5mg  phenergan.

## 2019-01-15 NOTE — TOC Initial Note (Signed)
Transition of Care [facility]) - Initial/Assessment Note    Patient Details  Name: Kristine Allison MRN: [id number] Date of Birth: 1981/06/09  Transition of [facility]) CM/SW Contact:    Allayne Butcher, RN Phone Number: 01/15/2019, 1:37 PM  Clinical Narrative:                 Patient admitted for DKA.  Patient is currently resting with eyes closed, patient will open eyes to voice and answer direct questions but then immediately closes eyes again.  Patient lives with parents, reports she drives, has no PCP or insurance.  She gets her insulin from [place] and reports that she has a working glucometer and supplies.  [facility] given for [facility] and referral placed.  Patient also given a list of other indigent health clinics in [place].  RNCM will cont to follow and assess for any other discharge needs.   Expected Discharge Plan: Home/Self Care Barriers to Discharge: Continued Medical Work up   Patient Goals and CMS Choice        Expected Discharge Plan and Services Expected Discharge Plan: Home/Self Care   Discharge Planning Services: CM Consult, Indigent Health Clinic   Living arrangements for the past 2 months: Single Family Home                                      Prior Living Arrangements/Services Living arrangements for the past 2 months: Single Family Home Lives with:: Parents Patient language and need for interpreter reviewed:: Yes Do you feel safe going back to the place where you live?: Yes      Need for Family Participation in Patient Care: Yes (Comment) Care giver support system in place?: Yes (comment)(lives with parents)   Criminal Activity/Legal Involvement Pertinent to Current Situation/Hospitalization: No - Comment as needed  Activities of Daily Living Home Assistive Devices/Equipment: None ADL Screening (condition at time of admission) Patient's cognitive ability adequate to safely complete daily activities?: Yes Is the patient  deaf or have difficulty hearing?: No Does the patient have difficulty seeing, even when wearing glasses/contacts?: No Does the patient have difficulty concentrating, remembering, or making decisions?: No Patient able to express need for assistance with ADLs?: Yes Does the patient have difficulty dressing or bathing?: No Independently performs ADLs?: Yes (appropriate for developmental age) Does the patient have difficulty walking or climbing stairs?: No Weakness of Legs: None Weakness of Arms/Hands: None  Permission Sought/Granted                  Emotional Assessment Appearance:: Appears older than stated age Attitude/Demeanor/Rapport: Other (comment)(lethargic) Affect (typically observed): Calm, Withdrawn Orientation: : Oriented to Self, Oriented to Place, Oriented to  Time, Oriented to Situation Alcohol / Substance Use: Not Applicable Psych Involvement: No (comment)  Admission diagnosis:  Diabetic ketoacidosis without coma associated with type 1 diabetes mellitus (HCC) [E10.10] Patient Active Problem List   Diagnosis Date Noted  . Anxiety 08/07/2018  . DKA (diabetic ketoacidoses) (HCC) 08/07/2018  . Edema of the tongue 03/26/2018  . Dehydration   . Diabetic ketoacidosis without coma associated with type 1 diabetes mellitus (HCC)   . Yeast infection 02/09/2015  . Acute renal failure syndrome (HCC)   . Tachycardia 11/03/2014  . Insomnia 09/08/2014  . Dizziness 09/08/2014  . Migraine, unspecified 12/14/2013  . Elevated troponin I level 11/19/2013  . Acute myocardial infarction, subendocardial infarction, initial episode  of care (HCC) 11/18/2013  . Noncompliance with medication regimen 11/15/2013  . Leg pain 09/08/2013  . Loss of weight 09/08/2013  . Depression 09/08/2013  . Hypotension, unspecified 09/08/2013  . Tobacco use disorder 08/01/2013  . Diabetes mellitus type 1, uncontrolled (HCC) 06/24/2012  . GERD (gastroesophageal reflux disease) 06/17/2012   PCP:   Patient, No Pcp Per Pharmacy:   [facility] [address] - [place], [place] - [address] [place] [address] Phone: [phone] Fax: [phone]  [facility] [address], [place] - [id number] [place] [address] Phone: [phone] Fax: [phone]     Social Determinants of Health (SDOH) Interventions    Readmission Risk Interventions No flowsheet data found.

## 2019-01-15 NOTE — Plan of Care (Addendum)
Pt did not eat breakfast nor lunch. C/o nausea. Zofran and phenergan given with some improvement.  MD aware. Reglan initiated.  Pt sleeping between care and keeps the light off.  Withdrawn and flat affect.  Encouraged pt and offered to assist to bsc/bathroom/recliner.  Pt refuses and last used the bedpan. MD aware.

## 2019-01-16 ENCOUNTER — Inpatient Hospital Stay: Payer: Self-pay

## 2019-01-16 LAB — GLUCOSE, CAPILLARY
Glucose-Capillary: 122 mg/dL — ABNORMAL HIGH (ref 70–99)
Glucose-Capillary: 113 mg/dL — ABNORMAL HIGH (ref 70–99)
Glucose-Capillary: 198 mg/dL — ABNORMAL HIGH (ref 70–99)
Glucose-Capillary: 149 mg/dL — ABNORMAL HIGH (ref 70–99)

## 2019-01-16 MED ORDER — METOCLOPRAMIDE HCL 5 MG/ML IJ SOLN
10.0000 mg | Freq: Two times a day (BID) | INTRAMUSCULAR | Status: DC | PRN
  Administered 2019-01-16 – 2019-01-17 (×2): 10 mg via INTRAVENOUS
  Filled 2019-01-16 (×2): qty 2

## 2019-01-16 MED ORDER — INSULIN GLARGINE 100 UNIT/ML ~~LOC~~ SOLN
20.0000 [IU] | Freq: Every day | SUBCUTANEOUS | Status: DC
  Filled 2019-01-16 (×2): qty 0.2

## 2019-01-16 NOTE — Progress Notes (Signed)
[facility] - [place] at [facility]   PATIENT NAME: Kristine Allison    MR#:  [id number]  DATE OF BIRTH:  1981-03-04  SUBJECTIVE:  CHIEF COMPLAINT:   Chief Complaint  Patient presents with  . Weakness  . Shortness of Breath  . Altered Mental Status  DKA resolved.  Does not have much nausea today.  Received some Phenergan injections overnight and was very sleepy today.  Says-she does not feel hungry and do not want to eat.  Her blood sugar dropped up to 60.  REVIEW OF SYSTEMS:    ROS  CONSTITUTIONAL: No documented fever. Has fatigue, weakness. No weight gain, no weight loss.  EYES: No blurry or double vision.  ENT: No tinnitus. No postnasal drip. No redness of the oropharynx.  RESPIRATORY: No cough, no wheeze, no hemoptysis. No dyspnea.  CARDIOVASCULAR: No chest pain. No orthopnea. No palpitations. No syncope.  GASTROINTESTINAL: Have nausea, no vomiting or diarrhea. No abdominal pain. No melena or hematochezia.  GENITOURINARY: No dysuria or hematuria.  ENDOCRINE: No polyuria or nocturia. No heat or cold intolerance.  HEMATOLOGY: No anemia. No bruising. No bleeding.  INTEGUMENTARY: No rashes. No lesions.  MUSCULOSKELETAL: No arthritis. No swelling. No gout.  NEUROLOGIC: No numbness, tingling, or ataxia. No seizure-type activity.  PSYCHIATRIC: No anxiety. No insomnia. No ADD.   DRUG ALLERGIES:   Allergies  Allergen Reactions  . Latex Rash    VITALS:  Blood pressure (!) 148/99, pulse 67, temperature 98.4 F (36.9 C), temperature source Oral, resp. rate 20, height 5\' 6"  (1.676 m), weight 57.7 kg, last menstrual period 10/23/2013, SpO2 92 %.  PHYSICAL EXAMINATION:   Physical Exam  GENERAL:  38 y.o.-year-old patient lying in the bed with no acute distress.  EYES: Pupils equal, round, reactive to light and accommodation. No scleral icterus. Extraocular muscles intact.  HEENT: Head atraumatic, normocephalic. Oropharynx dry and nasopharynx clear.  NECK:   Supple, no jugular venous distention. No thyroid enlargement, no tenderness.  LUNGS: Normal breath sounds bilaterally, no wheezing, rales, rhonchi. No use of accessory muscles of respiration.  CARDIOVASCULAR: S1, S2 normal. No murmurs, rubs, or gallops.  ABDOMEN: Soft, nontender, nondistended. Bowel sounds present. No organomegaly or mass.  EXTREMITIES: No cyanosis, clubbing or edema b/l.    NEUROLOGIC: Cranial nerves II through XII are intact. No focal Motor or sensory deficits b/l.   PSYCHIATRIC: The patient is sleepy but arousable and oriented x 3.  Flat affect. SKIN: No obvious rash, lesion, or ulcer.   LABORATORY PANEL:   CBC Recent Labs  Lab 01/15/19 0352  WBC 6.7  HGB 12.3  HCT 37.6  PLT 157   ------------------------------------------------------------------------------------------------------------------ Chemistries  Recent Labs  Lab 01/13/19 1737  01/15/19 0352  NA 128*   < > 139  K 6.7*   < > 4.3  CL 85*   < > 104  CO2 9*   < > 22  GLUCOSE 997*   < > 237*  BUN 62*   < > 24*  CREATININE 1.94*   < > 0.66  CALCIUM 8.6*   < > 7.8*  AST 27  --   --   ALT 29  --   --   ALKPHOS 132*  --   --   BILITOT 1.5*  --   --    < > = values in this interval not displayed.   ------------------------------------------------------------------------------------------------------------------  Cardiac Enzymes No results for input(s): TROPONINI in the last 168 hours. ------------------------------------------------------------------------------------------------------------------  RADIOLOGY:  Dg Chest  1 View  Result Date: 01/14/2019 CLINICAL DATA:  Nonproductive cough with bilateral rhonchi. History of asthma and diabetes. EXAM: CHEST  1 VIEW COMPARISON:  Radiographs 01/13/2019 and 06/11/2018. FINDINGS: 2024 hours. The heart size and mediastinal contours are normal. The lungs are clear. There is no pleural effusion or pneumothorax. No acute osseous findings are identified.  IMPRESSION: Stable chest.  No active cardiopulmonary process. Electronically Signed   By: Carey BullocksWilliam  Veazey M.D.   On: 01/14/2019 20:46   Dg Chest 2 View  Result Date: 01/16/2019 CLINICAL DATA:  Smoker.  Cough and asthma. EXAM: CHEST - 2 VIEW COMPARISON:  January 14, 2019 FINDINGS: The heart, hila, mediastinum, lungs, and pleura are normal. IMPRESSION: No active cardiopulmonary disease. Electronically Signed   By: Gerome Samavid  Williams III M.D   On: 01/16/2019 08:42     ASSESSMENT AND PLAN:  38 year old female patient with history of type 1 diabetes mellitus, bronchial asthma, anxiety disorder was in the ICU for DKA.  Currently transferred to medical floor.  -Diabetic ketoacidosis- improved Off insulin drip Lantus insulin sliding scale coverage as per diabetic coordinator recommendations Started diabetic diet  -Dehydration IV fluids  -Hypokalemia Improved  -Acute metabolic encephalopathy improved  -Depression-patient appears with flat affect.  I will start on small dose Zoloft.  -Persistent nausea-possibly diabetic gastroparesis-will start on Reglan. Changed Reglan to as needed basis as patient was remaining very sleepy.  And she does not have much nausea now.  -Pseudohyponatremia secondary to hyperglycemia improved  -Acute kidney injury Improved with IV fluids  -Tobacco abuse Tobacco cessation counseled to the patient for 6 minutes Nicotine patch offered  All the records are reviewed and case discussed with Care Management/Social Worker. Management plans discussed with the patient, family and they are in agreement.  CODE STATUS: Full code  DVT Prophylaxis: SCDs  TOTAL TIME TAKING CARE OF THIS PATIENT: 35 minutes.   POSSIBLE D/C IN 1 to 2 DAYS, DEPENDING ON CLINICAL CONDITION. As patient did not eat anything in last 24 hours and blood sugar was on the lower side and she is sleepy, I would not discharge her today but monitor in the hospital.  Altamese DillingVaibhavkumar Ilaria Much M.D on  01/16/2019 at 1:54 PM  Between 7am to 6pm - Pager - [phone]  After 6pm go to [email or web address] - password [email or web address]  [facility]  Office  [phone]  CC: Primary care physician; Patient, No Pcp Per  Note: This dictation was prepared with Dragon dictation along with smaller phrase technology. Any transcriptional errors that result from this process are unintentional.

## 2019-01-16 NOTE — Plan of Care (Signed)
  Problem: Education: Goal: Knowledge of General Education information will improve Description Including pain rating scale, medication(s)/side effects and non-pharmacologic comfort measures Outcome: Progressing   Problem: Clinical Measurements: Goal: Ability to maintain clinical measurements within normal limits will improve Outcome: Progressing   Problem: Nutrition: Goal: Adequate nutrition will be maintained Outcome: Progressing   Problem: Pain Managment: Goal: General experience of comfort will improve Outcome: Progressing   Problem: Safety: Goal: Ability to remain free from injury will improve Outcome: Progressing   Problem: Fluid Volume: Goal: Ability to maintain a balanced intake and output will improve Outcome: Progressing   Problem: Metabolic: Goal: Ability to maintain appropriate glucose levels will improve Outcome: Progressing   Problem: Nutritional: Goal: Maintenance of adequate nutrition will improve Outcome: Progressing   

## 2019-01-16 NOTE — Progress Notes (Addendum)
Pt awakened to walk with slow waking; needed encouragement to get OOB for walk. Not eating dinners when ask why she stated, "I am just not hungry". Education done on importance of regular meals/snacks with no response. Pulse ox at rest 92% RA with HR 59; walking with probe on left 2nd pointer finger secured with pulse ox 92% with HR 92. Pt ambulated around the pod x 3 and around the nurses station and tolerated well with slight off balance that she corrected herself. Pt returned to bed and closed eyes.

## 2019-01-16 NOTE — Progress Notes (Addendum)
Pt observed to have dry heaves with zofran IV x 1 given this a.m. Pt has refused to eat with no breakfast/lunch eaten. Pt sleeping very soundly at long intervals- ignores phone ringing repeatedly, awaken with request to eat, but returns to sleep. Dr. Elisabeth Pigeon updated with orders to hold lantus until pt eats. No SSI coveraged x 2 thus far. Ambulated with pulse ox 92-93 % RA at rest with 88% with walking. Unexplained lethargy/sedated like state; arouses with touch and not name call. Pt aroused to answer phone and then returned to sleep like state. Skin warm/dry/pink color. Less hacky cough with diminished breath sounds; wheezes primarily on the left. Chest xray report noted. Pt educated on detected lung changes/tobacco cessation; verbalized not motivated to discontinue smoking.

## 2019-01-17 LAB — URINALYSIS, COMPLETE (UACMP) WITH MICROSCOPIC
Bacteria, UA: NONE SEEN
Bilirubin Urine: NEGATIVE
Glucose, UA: >=500 mg/dL — AB
Hgb urine dipstick: NEGATIVE
Ketones, ur: 80 mg/dL — AB
Leukocytes,Ua: NEGATIVE
Nitrite: NEGATIVE
Protein, ur: NEGATIVE mg/dL
Specific Gravity, Urine: 1.012 (ref 1.005–1.030)
pH: 6 (ref 5.0–8.0)

## 2019-01-17 LAB — BASIC METABOLIC PANEL
Anion gap: 23 — ABNORMAL HIGH (ref 5–15)
Anion gap: 15 (ref 5–15)
BUN: 22 mg/dL — ABNORMAL HIGH (ref 6–20)
BUN: 21 mg/dL — ABNORMAL HIGH (ref 6–20)
CO2: 13 mmol/L — ABNORMAL LOW (ref 22–32)
CO2: 17 mmol/L — ABNORMAL LOW (ref 22–32)
Calcium: 8.6 mg/dL — ABNORMAL LOW (ref 8.9–10.3)
Calcium: 8.1 mg/dL — ABNORMAL LOW (ref 8.9–10.3)
Chloride: 98 mmol/L (ref 98–111)
Chloride: 103 mmol/L (ref 98–111)
Creatinine, Ser: 0.88 mg/dL (ref 0.44–1.00)
Creatinine, Ser: 0.81 mg/dL (ref 0.44–1.00)
GFR calc Af Amer: >60 mL/min (ref >60)
GFR calc Af Amer: >60 mL/min (ref >60)
GFR calc non Af Amer: >60 mL/min (ref >60)
GFR calc non Af Amer: >60 mL/min (ref >60)
Glucose, Bld: 361 mg/dL — ABNORMAL HIGH (ref 70–99)
Glucose, Bld: 210 mg/dL — ABNORMAL HIGH (ref 70–99)
Potassium: 4.9 mmol/L (ref 3.5–5.1)
Potassium: 4.3 mmol/L (ref 3.5–5.1)
Sodium: 134 mmol/L — ABNORMAL LOW (ref 135–145)
Sodium: 135 mmol/L (ref 135–145)

## 2019-01-17 LAB — BLOOD GAS, ARTERIAL
Acid-base deficit: 7.3 mmol/L — ABNORMAL HIGH (ref 0.0–2.0)
Bicarbonate: 16.9 mmol/L — ABNORMAL LOW (ref 20.0–28.0)
FIO2: 0.21
O2 Saturation: 89.9 %
Patient temperature: 37
pCO2 arterial: 30 mmHg — ABNORMAL LOW (ref 32.0–48.0)
pH, Arterial: 7.36 (ref 7.350–7.450)
pO2, Arterial: 61 mmHg — ABNORMAL LOW (ref 83.0–108.0)

## 2019-01-17 LAB — GLUCOSE, CAPILLARY
Glucose-Capillary: 279 mg/dL — ABNORMAL HIGH (ref 70–99)
Glucose-Capillary: 320 mg/dL — ABNORMAL HIGH (ref 70–99)
Glucose-Capillary: 337 mg/dL — ABNORMAL HIGH (ref 70–99)
Glucose-Capillary: 281 mg/dL — ABNORMAL HIGH (ref 70–99)
Glucose-Capillary: 211 mg/dL — ABNORMAL HIGH (ref 70–99)
Glucose-Capillary: 241 mg/dL — ABNORMAL HIGH (ref 70–99)
Glucose-Capillary: 227 mg/dL — ABNORMAL HIGH (ref 70–99)

## 2019-01-17 MED ORDER — INSULIN ASPART 100 UNIT/ML ~~LOC~~ SOLN
5.0000 [IU] | Freq: Once | SUBCUTANEOUS | Status: AC
  Administered 2019-01-17: 10:00:00 5 [IU] via SUBCUTANEOUS

## 2019-01-17 MED ORDER — INSULIN GLARGINE 100 UNIT/ML ~~LOC~~ SOLN
18.0000 [IU] | Freq: Every day | SUBCUTANEOUS | Status: DC
  Administered 2019-01-17: 18 [IU] via SUBCUTANEOUS
  Filled 2019-01-17 (×2): qty 0.18

## 2019-01-17 MED ORDER — SODIUM CHLORIDE 0.9 % IV BOLUS
1000.0000 mL | Freq: Once | INTRAVENOUS | Status: AC
  Administered 2019-01-17: 08:00:00 1000 mL via INTRAVENOUS

## 2019-01-17 MED ORDER — SODIUM CHLORIDE 0.9 % IV SOLN
INTRAVENOUS | Status: DC
  Administered 2019-01-17 – 2019-01-18 (×3): via INTRAVENOUS

## 2019-01-17 NOTE — Progress Notes (Addendum)
Inpatient Diabetes Program Recommendations  AACE/ADA: New Consensus Statement on Inpatient Glycemic Control  Target Ranges:  Prepandial:   less than 140 mg/dL      Peak postprandial:   less than 180 mg/dL (1-2 hours)      Critically ill patients:  140 - 180 mg/dL   Results for Kristine Allison, Kristine Allison (MRN [id number]) as of 01/17/2019 07:28  Ref. Range 01/16/2019 07:47 01/16/2019 11:47 01/16/2019 16:47 01/16/2019 20:08 01/17/2019 03:10 01/17/2019 06:50  Glucose-Capillary Latest Ref Range: 70 - 99 mg/dL 324 (H)  Novolog 1 unit 113 (H) 198 (H)  Novolog 2 units 149 (H) 279 (H) 320 (H)   Review of Glycemic Control  Diabetes history: DM1 (makes NO insulin; requires basal, correction, and meal coverage insulin) Outpatient Diabetes medications: 70/30 12-20 units BID  Current orders for Inpatient glycemic control: Lantus 20 units daily, Novolog 0-9 units TID with meals, Novolog 0-5 units QHS  Inpatient Diabetes Program Recommendations:   Insulin-Basal: In reviewing chart noted Lantus was NOT GIVEN on 01/16/19 (charted as patient not eating, MD order to hold). Since patient has Type 1 DM she requires basal insulin regardless if she is eating. As a result of not getting basal insulin on 01/16/19, glucose 320 mg/dl this morning at 4:01.  Recommend ordering BMET to see if patient is back in DKA. Sent chat message to Dr. Elisabeth Pigeon at 7:32 am.  IF patient is not in DKA this morning, please consider decreasing Lantus to 16 units daily and give this morning.  Thanks, Orlando Penner, RN, MSN, CDE Diabetes Coordinator  Inpatient Diabetes Program [phone] (Team Pager from 8am to 5pm)

## 2019-01-17 NOTE — Progress Notes (Signed)
Patient requested blood sugar to be tested, results shown 320. Requested antinausea medication, administered prn reglan. Notified Dr. Elisabeth Pigeon, will report to oncoming RN. Will continue to monitor.

## 2019-01-17 NOTE — Progress Notes (Signed)
[employer] - [place] at [facility]   PATIENT NAME: Kristine Allison    MR#:  [id number]  DATE OF BIRTH:  [birth year]  SUBJECTIVE:  CHIEF COMPLAINT:   Chief Complaint  Patient presents with  . Weakness  . Shortness of Breath  . Altered Mental Status  DKA resolved.  Does not have much nausea today.  Due to significant sleepiness and not eating anything for 24-hour she was not given Lantus injection yesterday.  Today morning her blood sugar was more than 300 and her bicarb was 13.  Patient was feeling very thirsty and her blood pressure was running on lower side.  REVIEW OF SYSTEMS:    ROS  CONSTITUTIONAL: No documented fever. Has fatigue, weakness. No weight gain, no weight loss.  EYES: No blurry or double vision.  ENT: No tinnitus. No postnasal drip. No redness of the oropharynx.  RESPIRATORY: No cough, no wheeze, no hemoptysis. No dyspnea.  CARDIOVASCULAR: No chest pain. No orthopnea. No palpitations. No syncope.  GASTROINTESTINAL: Have nausea, no vomiting or diarrhea. No abdominal pain. No melena or hematochezia.  GENITOURINARY: No dysuria or hematuria.  ENDOCRINE: No polyuria or nocturia. No heat or cold intolerance.  HEMATOLOGY: No anemia. No bruising. No bleeding.  INTEGUMENTARY: No rashes. No lesions.  MUSCULOSKELETAL: No arthritis. No swelling. No gout.  NEUROLOGIC: No numbness, tingling, or ataxia. No seizure-type activity.  PSYCHIATRIC: No anxiety. No insomnia. No ADD.   DRUG ALLERGIES:   Allergies  Allergen Reactions  . Latex Rash    VITALS:  Blood pressure (!) 96/50, pulse (!) 107, temperature 97.8 F (36.6 C), temperature source Oral, resp. rate 18, height 5\' 6"  (1.676 m), weight 57.7 kg, last menstrual period 10/23/2013, SpO2 97 %.  PHYSICAL EXAMINATION:   Physical Exam  GENERAL:  38 y.o.-year-old patient lying in the bed with no acute distress.  EYES: Pupils equal, round, reactive to light and accommodation. No scleral icterus.  Extraocular muscles intact.  HEENT: Head atraumatic, normocephalic. Oropharynx dry and nasopharynx clear.  NECK:  Supple, no jugular venous distention. No thyroid enlargement, no tenderness.  LUNGS: Normal breath sounds bilaterally, no wheezing, rales, rhonchi. No use of accessory muscles of respiration.  CARDIOVASCULAR: S1, S2 normal. No murmurs, rubs, or gallops.  ABDOMEN: Soft, nontender, nondistended. Bowel sounds present. No organomegaly or mass.  EXTREMITIES: No cyanosis, clubbing or edema b/l.    NEUROLOGIC: Cranial nerves II through XII are intact. No focal Motor or sensory deficits b/l.   PSYCHIATRIC: The patient is sleepy but arousable and oriented x 3.  Flat affect. SKIN: No obvious rash, lesion, or ulcer.   LABORATORY PANEL:   CBC Recent Labs  Lab 01/15/19 0352  WBC 6.7  HGB 12.3  HCT 37.6  PLT 157   ------------------------------------------------------------------------------------------------------------------ Chemistries  Recent Labs  Lab 01/13/19 1737  01/17/19 1232  NA 128*   < > 135  K 6.7*   < > 4.3  CL 85*   < > 103  CO2 9*   < > 17*  GLUCOSE 997*   < > 210*  BUN 62*   < > 21*  CREATININE 1.94*   < > 0.81  CALCIUM 8.6*   < > 8.1*  AST 27  --   --   ALT 29  --   --   ALKPHOS 132*  --   --   BILITOT 1.5*  --   --    < > = values in this interval not displayed.   ------------------------------------------------------------------------------------------------------------------  Cardiac Enzymes No results for input(s): TROPONINI in the last 168 hours. ------------------------------------------------------------------------------------------------------------------  RADIOLOGY:  Dg Chest 2 View  Result Date: 01/16/2019 CLINICAL DATA:  Smoker.  Cough and asthma. EXAM: CHEST - 2 VIEW COMPARISON:  January 14, 2019 FINDINGS: The heart, hila, mediastinum, lungs, and pleura are normal. IMPRESSION: No active cardiopulmonary disease. Electronically Signed   By:  Gerome Sam III M.D   On: 01/16/2019 08:42     ASSESSMENT AND PLAN:  38 year old female patient with history of type 1 diabetes mellitus, bronchial asthma, anxiety disorder was in the ICU for DKA.  Currently transferred to medical floor.  -Diabetic ketoacidosis- improved Off insulin drip Lantus insulin sliding scale coverage as per diabetic coordinator recommendations Started diabetic diet Patient blood sugar was high again today due to missing Lantus dose yesterday.  Her bicarb was low and anion gap was increasing. I had restarted Lantus and given a small dose of insulin and IV fluid bolus which helped to control the bicarb and anion gap again.  We would continue to monitor to make sure that patient does not go in DKA again.  -Dehydration IV fluids  -Hypokalemia Improved  -Acute metabolic encephalopathy improved  -Depression-patient appears with flat affect.  I will start on small dose Zoloft.  -Persistent nausea-possibly diabetic gastroparesis-will start on Reglan. Changed Reglan to as needed basis as patient was remaining very sleepy.  And she does not have much nausea now.  -Pseudohyponatremia secondary to hyperglycemia improved  -Acute kidney injury Improved with IV fluids  -Tobacco abuse Tobacco cessation counseled to the patient for 6 minutes Nicotine patch offered  All the records are reviewed and case discussed with Care Management/Social Worker. Management plans discussed with the patient, family and they are in agreement.  CODE STATUS: Full code  DVT Prophylaxis: SCDs  TOTAL TIME TAKING CARE OF THIS PATIENT: 35 minutes.   POSSIBLE D/C IN 1 to 2 DAYS, DEPENDING ON CLINICAL CONDITION. As patient blood sugar went high again and anion gap increased with low bicarb, she is at high risk of getting into DKA so we would continue to monitor in hospital for better blood sugar control tonight.  Altamese Dilling M.D on 01/17/2019 at 4:27 PM  Between 7am to 6pm  - Pager - [phone]  After 6pm go to [email or web address] - password [email or web address]  [facility] [place] [facility]  Office  [phone]  CC: Primary care physician; Patient, No Pcp Per  Note: This dictation was prepared with Dragon dictation along with smaller phrase technology. Any transcriptional errors that result from this process are unintentional.

## 2019-01-17 NOTE — Plan of Care (Signed)
  Problem: Education: Goal: Knowledge of General Education information will improve Description Including pain rating scale, medication(s)/side effects and non-pharmacologic comfort measures Outcome: Progressing   Problem: Clinical Measurements: Goal: Ability to maintain clinical measurements within normal limits will improve Outcome: Progressing   Problem: Nutrition: Goal: Adequate nutrition will be maintained Outcome: Progressing   Problem: Pain Managment: Goal: General experience of comfort will improve Outcome: Progressing   Problem: Safety: Goal: Ability to remain free from injury will improve Outcome: Progressing   Problem: Fluid Volume: Goal: Ability to maintain a balanced intake and output will improve Outcome: Progressing   Problem: Metabolic: Goal: Ability to maintain appropriate glucose levels will improve Outcome: Progressing   Problem: Nutritional: Goal: Maintenance of adequate nutrition will improve Outcome: Progressing   

## 2019-01-18 LAB — BASIC METABOLIC PANEL
Anion gap: 11 (ref 5–15)
BUN: 12 mg/dL (ref 6–20)
CO2: 21 mmol/L — ABNORMAL LOW (ref 22–32)
Calcium: 7.5 mg/dL — ABNORMAL LOW (ref 8.9–10.3)
Chloride: 102 mmol/L (ref 98–111)
Creatinine, Ser: 0.63 mg/dL (ref 0.44–1.00)
GFR calc Af Amer: >60 mL/min (ref >60)
GFR calc non Af Amer: >60 mL/min (ref >60)
Glucose, Bld: 313 mg/dL — ABNORMAL HIGH (ref 70–99)
Potassium: 3.5 mmol/L (ref 3.5–5.1)
Sodium: 134 mmol/L — ABNORMAL LOW (ref 135–145)

## 2019-01-18 LAB — GLUCOSE, CAPILLARY
Glucose-Capillary: 270 mg/dL — ABNORMAL HIGH (ref 70–99)
Glucose-Capillary: 197 mg/dL — ABNORMAL HIGH (ref 70–99)

## 2019-01-18 MED ORDER — INSULIN GLARGINE 100 UNIT/ML ~~LOC~~ SOLN
22.0000 [IU] | Freq: Every day | SUBCUTANEOUS | Status: DC
  Administered 2019-01-18: 09:00:00 22 [IU] via SUBCUTANEOUS
  Filled 2019-01-18: qty 0.22

## 2019-01-18 MED ORDER — ONDANSETRON HCL 4 MG PO TABS: 4.0000 mg | ORAL_TABLET | Freq: Four times a day (QID) | ORAL | 0 refills | Status: DC | PRN

## 2019-01-18 MED ORDER — SERTRALINE HCL 25 MG PO TABS: 25.0000 mg | ORAL_TABLET | Freq: Every day | ORAL | 0 refills | Status: DC

## 2019-01-18 NOTE — Discharge Summary (Signed)
[facility]   PATIENT NAME: Kristine Allison    MR#:  [id number]  DATE OF BIRTH:  [birth year]  DATE OF ADMISSION:  01/13/2019 ADMITTING PHYSICIAN: Milagros Loll, MD  DATE OF DISCHARGE: 01/18/2019   PRIMARY CARE PHYSICIAN: Patient, No Pcp Per    ADMISSION DIAGNOSIS:  Diabetic ketoacidosis without coma associated with type 1 diabetes mellitus (HCC) [E10.10]  DISCHARGE DIAGNOSIS:  Active Problems:   DKA (diabetic ketoacidoses) (HCC)   SECONDARY DIAGNOSIS:   Past Medical History:  Diagnosis Date  . Anxiety   . Asthma   . Diabetes mellitus without complication (HCC)   . Headache(784.0)     HOSPITAL COURSE:   38 year old female patient with history of type 1 diabetes mellitus, bronchial asthma, anxiety disorder was in the ICU for DKA.  Currently transferred to medical floor.  -Diabetic ketoacidosis- improved Off insulin drip Lantus insulin sliding scale coverage as per diabetic coordinator recommendations Started diabetic diet Patient blood sugar was high again today due to missing Lantus dose yesterday.  Her bicarb was low and anion gap was increasing. I had restarted Lantus and given a small dose of insulin and IV fluid bolus which helped to control the bicarb and anion gap again.  We would continue to monitor to make sure that patient does not go in DKA again. her blood sugar came under control and Bicarb and anion gap improved. She tolerated food fine- discharge home now.\\  -Dehydration IV fluids  -Hypokalemia Improved  -Acute metabolic encephalopathy improved  -Depression-patient appears with flat affect.  I will start on small dose Zoloft.  -Persistent nausea-possibly diabetic gastroparesis-will start on Reglan. Changed Reglan to as needed basis as patient was remaining very sleepy.  And she does not have much nausea now.  -Pseudohyponatremia secondary to hyperglycemia improved  -Acute kidney  injury Improved with IV fluids  -Tobacco abuse Tobacco cessation counseled to the patient for 6 minutes Nicotine patch offered  DISCHARGE CONDITIONS:   Stable.  CONSULTS OBTAINED:    DRUG ALLERGIES:   Allergies  Allergen Reactions  . Latex Rash    DISCHARGE MEDICATIONS:   Allergies as of 01/18/2019      Reactions   Latex Rash      Medication List    TAKE these medications   insulin NPH-regular Human (70-30) 100 UNIT/ML injection Commonly known as:  NovoLIN 70/30 20 units subcu twice daily What changed:    how much to take  how to take this  when to take this  additional instructions   ondansetron 4 MG tablet Commonly known as:  ZOFRAN Take 1 tablet (4 mg total) by mouth every 6 (six) hours as needed for nausea.   sertraline 25 MG tablet Commonly known as:  ZOLOFT Take 1 tablet (25 mg total) by mouth daily. Start taking on:  January 19, 2019        DISCHARGE INSTRUCTIONS:    Follow with PMD in 1-2 weeks.  If you experience worsening of your admission symptoms, develop shortness of breath, life threatening emergency, suicidal or homicidal thoughts you must seek medical attention immediately by calling 911 or calling your MD immediately  if symptoms less severe.  You Must read complete instructions/literature along with all the possible adverse reactions/side effects for all the Medicines you take and that have been prescribed to you. Take any new Medicines after you have completely understood and accept all the possible adverse reactions/side effects.   Please note  You were cared  for by a hospitalist during your hospital stay. If you have any questions about your discharge medications or the care you received while you were in the hospital after you are discharged, you can call the unit and asked to speak with the hospitalist on call if the hospitalist that took care of you is not available. Once you are discharged, your primary care physician will  handle any further medical issues. Please note that NO REFILLS for any discharge medications will be authorized once you are discharged, as it is imperative that you return to your primary care physician (or establish a relationship with a primary care physician if you do not have one) for your aftercare needs so that they can reassess your need for medications and monitor your lab values.    Today   CHIEF COMPLAINT:   Chief Complaint  Patient presents with  . Weakness  . Shortness of Breath  . Altered Mental Status    HISTORY OF PRESENT ILLNESS:  Kristine Allison  is a 38 y.o. female with a known history of diabetes mellitus, recurrent admissions for DKA, anxiety, asthma presents to the emergency room with feeling extremely weak, blood sugars in the 900s and severe DKA.  Patient is not providing much history.  Wakes up on calling her name and then falls back to sleep.  Here her venous pH was 7.03 with anion gap 34 and bicarb of 9.  She has been given 1 L normal saline and started on insulin drip.  VITAL SIGNS:  Blood pressure 130/84, pulse 76, temperature 98.3 F (36.8 C), temperature source Oral, resp. rate 18, height 5\' 6"  (1.676 m), weight 57.6 kg, last menstrual period 10/23/2013, SpO2 99 %.  I/O:    Intake/Output Summary (Last 24 hours) at 01/18/2019 1308 Last data filed at 01/18/2019 0132 Gross per 24 hour  Intake 1255.65 ml  Output 950 ml  Net 305.65 ml    PHYSICAL EXAMINATION:   GENERAL:  38 y.o.-year-old patient lying in the bed , looks critically ill EYES: Pupils equal, round, reactive to light and accommodation. No scleral icterus. Extraocular muscles intact.  HEENT: Head atraumatic, normocephalic. Oropharynx and nasopharynx clear. No oropharyngeal erythema, dry oral mucosa  NECK:  Supple, no jugular venous distention. No thyroid enlargement, no tenderness.  LUNGS: Normal breath sounds bilaterally, no wheezing, rales, rhonchi. No use of accessory muscles of  respiration.  CARDIOVASCULAR: S1, S2 .  Tachycardia ABDOMEN: Soft, nontender, nondistended. Bowel sounds present. No organomegaly or mass.  EXTREMITIES: No pedal edema, cyanosis, or clubbing. + 2 pedal & radial pulses b/l.   NEUROLOGIC: Not following instructions PSYCHIATRIC: The patient is drowsy  DATA REVIEW:   CBC Recent Labs  Lab 01/15/19 0352  WBC 6.7  HGB 12.3  HCT 37.6  PLT 157    Chemistries  Recent Labs  Lab 01/13/19 1737  01/18/19 0559  NA 128*   < > 134*  K 6.7*   < > 3.5  CL 85*   < > 102  CO2 9*   < > 21*  GLUCOSE 997*   < > 313*  BUN 62*   < > 12  CREATININE 1.94*   < > 0.63  CALCIUM 8.6*   < > 7.5*  AST 27  --   --   ALT 29  --   --   ALKPHOS 132*  --   --   BILITOT 1.5*  --   --    < > = values in this interval  not displayed.    Cardiac Enzymes No results for input(s): TROPONINI in the last 168 hours.  Microbiology Results  Results for orders placed or performed during the hospital encounter of 01/13/19  MRSA PCR Screening     Status: None   Collection Time: 01/13/19  9:35 PM  Result Value Ref Range Status   MRSA by PCR NEGATIVE NEGATIVE Final    Comment:        The GeneXpert MRSA Assay (FDA approved for NASAL specimens only), is one component of a comprehensive MRSA colonization surveillance program. It is not intended to diagnose MRSA infection nor to guide or monitor treatment for MRSA infections. Performed at [facility], [address] [place], [place] [address]     RADIOLOGY:  No results found.  EKG:   Orders placed or performed in visit on 01/13/19  . EKG 12-Lead  . EKG 12-Lead  . EKG 12-Lead      Management plans discussed with the patient, family and they are in agreement.  CODE STATUS:     Code Status Orders  (From admission, onward)         Start     Ordered   01/13/19 1912  Full code  Continuous     01/13/19 1912        Code Status History    Date Active Date Inactive Code Status Order  ID Comments User Context   08/07/2018 2251 08/09/2018 2113 Full Code [id number]  Oralia Manis, MD ED   03/25/2018 1136 03/26/2018 1708 Full Code [id number]  Belva Bertin, RN Inpatient   03/22/2017 2140 03/24/2017 1842 Full Code [id number]  Enid Baas, MD Inpatient   10/02/2015 1355 10/03/2015 1830 Full Code [id number]  Altamese Dilling, MD Inpatient   09/25/2015 0323 09/25/2015 2020 Full Code [id number]  Myra Rude, MD ED   07/25/2015 1733 07/27/2015 1525 Full Code [id number]  Zannie Cove, MD Inpatient   05/03/2015 1522 05/05/2015 1634 Full Code [id number]  Onnie Boer, MD ED   02/09/2015 2256 02/11/2015 1641 Full Code [id number]  Raliegh Ip, DO Inpatient   12/21/2014 0843 12/23/2014 2053 Full Code [id number]  Bernadene Person, NP ED   08/20/2014 0543 08/22/2014 1903 Full Code [id number]  Oralia Manis, MD Inpatient   12/02/2013 0210 12/03/2013 1700 Full Code [id number]  Leona Singleton, MD Inpatient   11/15/2013 1532 11/20/2013 1631 Full Code [id number]  Jeanella Craze, NP ED   07/26/2013 2259 07/29/2013 1830 Full Code [id number]  Charm Rings, MD Inpatient   06/05/2013 2338 06/07/2013 1822 Full Code [id number]  Glori Luis, MD Inpatient   06/05/2013 2338 06/05/2013 2338 Full Code [id number]  Glori Luis, MD Inpatient      TOTAL TIME TAKING CARE OF THIS PATIENT: 35 minutes.    Altamese Dilling M.D on 01/18/2019 at 1:08 PM  Between 7am to 6pm - Pager - [phone]  After 6pm go to [email or web address] - password [email or web address]  [facility]  Office  [phone]  CC: Primary care physician; Patient, No Pcp Per   Note: This dictation was prepared with Dragon dictation along with smaller phrase technology. Any transcriptional errors that result from this process are unintentional.

## 2019-01-18 NOTE — Progress Notes (Signed)
[facility]         [place], [place].   01/18/2019  Patient: Kristine Allison   Date of Birth:  [birth year]  Date of admission:  01/13/2019  Date of Discharge  01/18/2019    To Whom it May Concern:   [name]  May return to work on 01/20/19 without any restricitions.   If you have any questions or concerns, please don't hesitate to call.  Sincerely,   Altamese Dilling M.D Pager [phone] Office : [phone]   .

## 2019-01-18 NOTE — Progress Notes (Signed)
Inpatient Diabetes Program Recommendations  AACE/ADA: New Consensus Statement on Inpatient Glycemic Control  Target Ranges:  Prepandial:   less than 140 mg/dL      Peak postprandial:   less than 180 mg/dL (1-2 hours)      Critically ill patients:  140 - 180 mg/dL   Results for SHIVA, EKLOF (MRN [id number]) as of 01/18/2019 07:31  Ref. Range 01/17/2019 06:50 01/17/2019 07:42 01/17/2019 10:06 01/17/2019 11:52 01/17/2019 14:08 01/17/2019 17:18 01/17/2019 21:36  Glucose-Capillary Latest Ref Range: 70 - 99 mg/dL 453 (H) 646 (H)  Novolog 5 units 281 (H)  Novolog 3 units 211 (H)  Novolog 3 units    Lantus 18 units 241 (H)  Novolog 3 units 227 (H)  Novolog 2 units    Review of Glycemic Control  Diabetes history: DM1 (makes NO insulin; requires basal, correction, and meal coverage insulin) Outpatient Diabetes medications: 70/30 12-20 units BID  Current orders for Inpatient glycemic control: Lantus 18 units daily, Novolog 0-9 units TID with meals, Novolog 0-5 units QHS  Inpatient Diabetes Program Recommendations:    Insulin-Basal: Please consider increasing Lantus to 20 units daily.  Insulin-Meal Coverage: Please consider ordering Novolog 3 units TID with meals for meal coverage if patient eats at least 50% of meals.  Thanks, Orlando Penner, RN, MSN, CDE Diabetes Coordinator Inpatient Diabetes Program [phone] (Team Pager from 8am to 5pm)

## 2019-01-18 NOTE — Progress Notes (Signed)
Received MD order to discharge patient to home, reviewed home nmeds, prescriptions discharge instructions and follow up appointments with patient and patient verbalized understanding, gave work note from MD to patient

## 2019-05-06 ENCOUNTER — Other Ambulatory Visit: Payer: Self-pay

## 2019-05-06 ENCOUNTER — Inpatient Hospital Stay
Admission: EM | Admit: 2019-05-06 | Discharge: 2019-05-07 | DRG: 638 | Disposition: A | Discharge status: Home / Self Care | Payer: Self-pay | Attending: Internal Medicine | Admitting: Internal Medicine

## 2019-05-06 ENCOUNTER — Emergency Department: Payer: Self-pay

## 2019-05-06 DIAGNOSIS — E1010 Type 1 diabetes mellitus with ketoacidosis without coma: Principal | ICD-10-CM | POA: Diagnosis present

## 2019-05-06 DIAGNOSIS — E1110 Type 2 diabetes mellitus with ketoacidosis without coma: Secondary | ICD-10-CM | POA: Diagnosis present

## 2019-05-06 DIAGNOSIS — J45909 Unspecified asthma, uncomplicated: Secondary | ICD-10-CM | POA: Diagnosis present

## 2019-05-06 DIAGNOSIS — Z79899 Other long term (current) drug therapy: Secondary | ICD-10-CM

## 2019-05-06 DIAGNOSIS — F419 Anxiety disorder, unspecified: Secondary | ICD-10-CM | POA: Diagnosis present

## 2019-05-06 DIAGNOSIS — Z794 Long term (current) use of insulin: Secondary | ICD-10-CM

## 2019-05-06 DIAGNOSIS — F17210 Nicotine dependence, cigarettes, uncomplicated: Secondary | ICD-10-CM | POA: Diagnosis present

## 2019-05-06 DIAGNOSIS — N179 Acute kidney failure, unspecified: Secondary | ICD-10-CM | POA: Diagnosis present

## 2019-05-06 DIAGNOSIS — Z20828 Contact with and (suspected) exposure to other viral communicable diseases: Secondary | ICD-10-CM | POA: Diagnosis present

## 2019-05-06 LAB — COMPREHENSIVE METABOLIC PANEL
ALT: 16 U/L (ref 0–44)
AST: 14 U/L — ABNORMAL LOW (ref 15–41)
Albumin: 4.7 g/dL (ref 3.5–5.0)
Alkaline Phosphatase: 95 U/L (ref 38–126)
Anion gap: 25 — ABNORMAL HIGH (ref 5–15)
BUN: 28 mg/dL — ABNORMAL HIGH (ref 6–20)
CO2: 13 mmol/L — ABNORMAL LOW (ref 22–32)
Calcium: 9.5 mg/dL (ref 8.9–10.3)
Chloride: 95 mmol/L — ABNORMAL LOW (ref 98–111)
Creatinine, Ser: 1.1 mg/dL — ABNORMAL HIGH (ref 0.44–1.00)
GFR calc Af Amer: >60 mL/min (ref >60)
GFR calc non Af Amer: >60 mL/min (ref >60)
Glucose, Bld: 400 mg/dL — ABNORMAL HIGH (ref 70–99)
Potassium: 4.6 mmol/L (ref 3.5–5.1)
Sodium: 133 mmol/L — ABNORMAL LOW (ref 135–145)
Total Bilirubin: 2 mg/dL — ABNORMAL HIGH (ref 0.3–1.2)
Total Protein: 7.8 g/dL (ref 6.5–8.1)

## 2019-05-06 LAB — GLUCOSE, CAPILLARY
Glucose-Capillary: 410 mg/dL — ABNORMAL HIGH (ref 70–99)
Glucose-Capillary: 371 mg/dL — ABNORMAL HIGH (ref 70–99)
Glucose-Capillary: 272 mg/dL — ABNORMAL HIGH (ref 70–99)
Glucose-Capillary: 220 mg/dL — ABNORMAL HIGH (ref 70–99)
Glucose-Capillary: 209 mg/dL — ABNORMAL HIGH (ref 70–99)
Glucose-Capillary: 183 mg/dL — ABNORMAL HIGH (ref 70–99)
Glucose-Capillary: 120 mg/dL — ABNORMAL HIGH (ref 70–99)
Glucose-Capillary: 194 mg/dL — ABNORMAL HIGH (ref 70–99)
Glucose-Capillary: 243 mg/dL — ABNORMAL HIGH (ref 70–99)

## 2019-05-06 LAB — BASIC METABOLIC PANEL
Anion gap: 15 (ref 5–15)
Anion gap: 11 (ref 5–15)
BUN: 27 mg/dL — ABNORMAL HIGH (ref 6–20)
BUN: 23 mg/dL — ABNORMAL HIGH (ref 6–20)
CO2: 17 mmol/L — ABNORMAL LOW (ref 22–32)
CO2: 20 mmol/L — ABNORMAL LOW (ref 22–32)
Calcium: 8.5 mg/dL — ABNORMAL LOW (ref 8.9–10.3)
Calcium: 8.4 mg/dL — ABNORMAL LOW (ref 8.9–10.3)
Chloride: 105 mmol/L (ref 98–111)
Chloride: 105 mmol/L (ref 98–111)
Creatinine, Ser: 0.99 mg/dL (ref 0.44–1.00)
Creatinine, Ser: 0.67 mg/dL (ref 0.44–1.00)
GFR calc Af Amer: >60 mL/min (ref >60)
GFR calc Af Amer: >60 mL/min (ref >60)
GFR calc non Af Amer: >60 mL/min (ref >60)
GFR calc non Af Amer: >60 mL/min (ref >60)
Glucose, Bld: 285 mg/dL — ABNORMAL HIGH (ref 70–99)
Glucose, Bld: 165 mg/dL — ABNORMAL HIGH (ref 70–99)
Potassium: 4.5 mmol/L (ref 3.5–5.1)
Potassium: 4.4 mmol/L (ref 3.5–5.1)
Sodium: 137 mmol/L (ref 135–145)
Sodium: 136 mmol/L (ref 135–145)

## 2019-05-06 LAB — CBC
HCT: 41.7 % (ref 36.0–46.0)
Hemoglobin: 13.5 g/dL (ref 12.0–15.0)
MCH: 29.9 pg (ref 26.0–34.0)
MCHC: 32.4 g/dL (ref 30.0–36.0)
MCV: 92.5 fL (ref 80.0–100.0)
Platelets: 202 10*3/uL (ref 150–400)
RBC: 4.51 MIL/uL (ref 3.87–5.11)
RDW: 13.2 % (ref 11.5–15.5)
WBC: 13 10*3/uL — ABNORMAL HIGH (ref 4.0–10.5)
nRBC: 0 % (ref 0.0–0.2)

## 2019-05-06 LAB — CBC WITH DIFFERENTIAL/PLATELET
Abs Immature Granulocytes: 0.14 10*3/uL — ABNORMAL HIGH (ref 0.00–0.07)
Basophils Absolute: 0.1 10*3/uL (ref 0.0–0.1)
Basophils Relative: 1 %
Eosinophils Absolute: 0 10*3/uL (ref 0.0–0.5)
Eosinophils Relative: 0 %
HCT: 45.5 % (ref 36.0–46.0)
Hemoglobin: 15 g/dL (ref 12.0–15.0)
Immature Granulocytes: 2 %
Lymphocytes Relative: 21 %
Lymphs Abs: 1.7 10*3/uL (ref 0.7–4.0)
MCH: 29.8 pg (ref 26.0–34.0)
MCHC: 33 g/dL (ref 30.0–36.0)
MCV: 90.5 fL (ref 80.0–100.0)
Monocytes Absolute: 0.5 10*3/uL (ref 0.1–1.0)
Monocytes Relative: 6 %
Neutro Abs: 5.6 10*3/uL (ref 1.7–7.7)
Neutrophils Relative %: 70 %
Platelets: 297 10*3/uL (ref 150–400)
RBC: 5.03 MIL/uL (ref 3.87–5.11)
RDW: 13.2 % (ref 11.5–15.5)
WBC: 8 10*3/uL (ref 4.0–10.5)
nRBC: 0 % (ref 0.0–0.2)

## 2019-05-06 LAB — BLOOD GAS, VENOUS
Acid-base deficit: 11.8 mmol/L — ABNORMAL HIGH (ref 0.0–2.0)
Bicarbonate: 14.6 mmol/L — ABNORMAL LOW (ref 20.0–28.0)
O2 Saturation: 86.6 %
Patient temperature: 37
pCO2, Ven: 34 mmHg — ABNORMAL LOW (ref 44.0–60.0)
pH, Ven: 7.24 — ABNORMAL LOW (ref 7.250–7.430)
pO2, Ven: 62 mmHg — ABNORMAL HIGH (ref 32.0–45.0)

## 2019-05-06 LAB — MRSA PCR SCREENING: MRSA by PCR: NEGATIVE

## 2019-05-06 LAB — HEMOGLOBIN A1C
Hgb A1c MFr Bld: 9.9 % — ABNORMAL HIGH (ref 4.8–5.6)
Mean Plasma Glucose: 237.43 mg/dL

## 2019-05-06 LAB — SARS CORONAVIRUS 2 BY RT PCR (HOSPITAL ORDER, PERFORMED IN ~~LOC~~ HOSPITAL LAB): SARS Coronavirus 2: NEGATIVE

## 2019-05-06 MED ORDER — SODIUM CHLORIDE 0.9 % IV SOLN
INTRAVENOUS | Status: DC
  Administered 2019-05-06: 04:00:00 via INTRAVENOUS

## 2019-05-06 MED ORDER — DEXTROSE-NACL 5-0.45 % IV SOLN
INTRAVENOUS | Status: DC
  Administered 2019-05-06: 07:00:00 via INTRAVENOUS

## 2019-05-06 MED ORDER — INSULIN ASPART 100 UNIT/ML ~~LOC~~ SOLN
0.0000 [IU] | Freq: Three times a day (TID) | SUBCUTANEOUS | Status: DC
  Administered 2019-05-06: 2 [IU] via SUBCUTANEOUS
  Administered 2019-05-07: 12:00:00 3 [IU] via SUBCUTANEOUS
  Administered 2019-05-07: 08:00:00 9 [IU] via SUBCUTANEOUS
  Filled 2019-05-06 (×3): qty 1

## 2019-05-06 MED ORDER — SODIUM CHLORIDE 0.9 % IV SOLN
INTRAVENOUS | Status: DC
  Administered 2019-05-06: 03:00:00 via INTRAVENOUS

## 2019-05-06 MED ORDER — INSULIN REGULAR(HUMAN) IN NACL 100-0.9 UT/100ML-% IV SOLN
INTRAVENOUS | Status: DC
  Administered 2019-05-06: 3.5 [IU]/h via INTRAVENOUS
  Filled 2019-05-06: qty 100

## 2019-05-06 MED ORDER — SODIUM CHLORIDE 0.9 % IV SOLN: INTRAVENOUS | Status: DC

## 2019-05-06 MED ORDER — ONDANSETRON HCL 4 MG/2ML IJ SOLN
4.0000 mg | Freq: Once | INTRAMUSCULAR | Status: AC
  Administered 2019-05-06: 03:00:00 4 mg via INTRAVENOUS
  Filled 2019-05-06: qty 2

## 2019-05-06 MED ORDER — INSULIN ASPART PROT & ASPART (70-30 MIX) 100 UNIT/ML ~~LOC~~ SUSP
12.0000 [IU] | Freq: Two times a day (BID) | SUBCUTANEOUS | Status: DC
  Administered 2019-05-06 – 2019-05-07 (×3): 12 [IU] via SUBCUTANEOUS
  Filled 2019-05-06 (×3): qty 10

## 2019-05-06 MED ORDER — SODIUM CHLORIDE 0.9 % IV BOLUS
1000.0000 mL | Freq: Once | INTRAVENOUS | Status: AC
  Administered 2019-05-06: 1000 mL via INTRAVENOUS

## 2019-05-06 MED ORDER — INSULIN ASPART 100 UNIT/ML ~~LOC~~ SOLN
0.0000 [IU] | Freq: Every day | SUBCUTANEOUS | Status: DC
  Administered 2019-05-06: 2 [IU] via SUBCUTANEOUS
  Filled 2019-05-06: qty 1

## 2019-05-06 MED ORDER — ENOXAPARIN SODIUM 40 MG/0.4ML ~~LOC~~ SOLN
40.0000 mg | SUBCUTANEOUS | Status: DC
  Administered 2019-05-06 – 2019-05-07 (×2): 40 mg via SUBCUTANEOUS
  Filled 2019-05-06 (×2): qty 0.4

## 2019-05-06 MED ORDER — SERTRALINE HCL 50 MG PO TABS
25.0000 mg | ORAL_TABLET | Freq: Every day | ORAL | Status: DC
  Administered 2019-05-07: 25 mg via ORAL
  Filled 2019-05-06: qty 1

## 2019-05-06 MED ORDER — ALBUTEROL SULFATE (2.5 MG/3ML) 0.083% IN NEBU: 2.5000 mg | INHALATION_SOLUTION | Freq: Four times a day (QID) | RESPIRATORY_TRACT | Status: DC | PRN

## 2019-05-06 MED ORDER — CHLORHEXIDINE GLUCONATE CLOTH 2 % EX PADS
6.0000 | MEDICATED_PAD | Freq: Every day | CUTANEOUS | Status: DC
  Administered 2019-05-06 – 2019-05-07 (×2): 6 via TOPICAL

## 2019-05-06 MED ORDER — INSULIN REGULAR(HUMAN) IN NACL 100-0.9 UT/100ML-% IV SOLN
INTRAVENOUS | Status: DC
  Filled 2019-05-06: qty 100

## 2019-05-06 MED ORDER — INSULIN REGULAR HUMAN 100 UNIT/ML IJ SOLN: 12.0000 [IU] | Freq: Two times a day (BID) | INTRAMUSCULAR | Status: DC

## 2019-05-06 MED ORDER — DEXTROSE-NACL 5-0.45 % IV SOLN: INTRAVENOUS | Status: DC

## 2019-05-06 MED ORDER — POTASSIUM CHLORIDE 10 MEQ/100ML IV SOLN: 10.0000 meq | INTRAVENOUS | Status: DC

## 2019-05-06 NOTE — Progress Notes (Addendum)
Report called to [place] on 1 C. 3943 moved to room 132 via wheelchair.

## 2019-05-06 NOTE — Progress Notes (Signed)
Late note 1000 Transitioned from Insulin drip to TID 70/30 and AC,HS sliding scale. Teaching done on importance of taking insulin etc. Patient very knowledgeable on insulin and importance of regular routine. Patient also stated she had done nothing out of her routine yesterday. Admitting glucose was only 410.

## 2019-05-06 NOTE — Progress Notes (Signed)
[employer] - [place] at [facility]                                                                                                                                                                                  Patient Demographics   Kristine Allison, is a 38 y.o. female, DOB - [birth year], EAV:[id number]  Admit date - 05/06/2019   Admitting Physician [name] A Mansy, MD  Outpatient Primary MD for the patient is Patient, No Pcp Per   LOS - 0  Subjective: Patient states abdominal pain resolved.  Admitted with DKA    Review of Systems:   CONSTITUTIONAL: No documented fever. No fatigue, weakness. No weight gain, no weight loss.  EYES: No blurry or double vision.  ENT: No tinnitus. No postnasal drip. No redness of the oropharynx.  RESPIRATORY: No cough, no wheeze, no hemoptysis. No dyspnea.  CARDIOVASCULAR: No chest pain. No orthopnea. No palpitations. No syncope.  GASTROINTESTINAL: No nausea, no vomiting or diarrhea. No abdominal pain. No melena or hematochezia.  GENITOURINARY: No dysuria or hematuria.  ENDOCRINE: No polyuria or nocturia. No heat or cold intolerance.  HEMATOLOGY: No anemia. No bruising. No bleeding.  INTEGUMENTARY: No rashes. No lesions.  MUSCULOSKELETAL: No arthritis. No swelling. No gout.  NEUROLOGIC: No numbness, tingling, or ataxia. No seizure-type activity.  PSYCHIATRIC: No anxiety. No insomnia. No ADD.    Vitals:   Vitals:   05/06/19 1200 05/06/19 1400 05/06/19 1500 05/06/19 1545  BP: 91/61 110/68  109/65  Pulse: 83   79  Resp: (!) 8  19 18   Temp: 98.4 F (36.9 C)   (!) 97.5 F (36.4 C)  TempSrc:    Oral  SpO2: 93%   99%  Weight:      Height:        Wt Readings from Last 3 Encounters:  05/06/19 56.7 kg  01/18/19 57.6 kg  08/25/18 54.4 kg     Intake/Output Summary (Last 24 hours) at 05/06/2019 1555 Last data filed at 05/06/2019 1500 Gross per 24 hour  Intake 679.8 ml  Output 650 ml  Net 29.8 ml    Physical Exam:    GENERAL: Pleasant-appearing in no apparent distress.  HEAD, EYES, EARS, NOSE AND THROAT: Atraumatic, normocephalic. Extraocular muscles are intact. Pupils equal and reactive to light. Sclerae anicteric. No conjunctival injection. No oro-pharyngeal erythema.  NECK: Supple. There is no jugular venous distention. No bruits, no lymphadenopathy, no thyromegaly.  HEART: Regular rate and rhythm,. No murmurs, no rubs, no clicks.  LUNGS: Clear to auscultation bilaterally. No rales or rhonchi. No wheezes.  ABDOMEN: Soft, flat, nontender, nondistended. Has good bowel sounds. No hepatosplenomegaly appreciated.  EXTREMITIES: No evidence  of any cyanosis, clubbing, or peripheral edema.  +2 pedal and radial pulses bilaterally.  NEUROLOGIC: The patient is alert, awake, and oriented x3 with no focal motor or sensory deficits appreciated bilaterally.  SKIN: Moist and warm with no rashes appreciated.  Psych: Not anxious, depressed LN: No inguinal LN enlargement    Antibiotics   Anti-infectives (From admission, onward)   None      Medications   Scheduled Meds: . Chlorhexidine Gluconate Cloth  6 each Topical Q0600  . enoxaparin (LOVENOX) injection  40 mg Subcutaneous Q24H  . insulin aspart  0-5 Units Subcutaneous QHS  . insulin aspart  0-9 Units Subcutaneous TID WC  . insulin aspart protamine- aspart  12 Units Subcutaneous BID WC  . sertraline  25 mg Oral Daily   Continuous Infusions: . insulin Stopped (05/06/19 0959)   PRN Meds:.albuterol   Data Review:   Micro Results Recent Results (from the past 240 hour(s))  SARS Coronavirus 2 [facility] order, Performed in [facility] hospital lab) Nasopharyngeal Nasopharyngeal Swab     Status: None   Collection Time: 05/06/19  3:28 AM   Specimen: Nasopharyngeal Swab  Result Value Ref Range Status   SARS Coronavirus 2 NEGATIVE NEGATIVE Final    Comment: (NOTE) If result is NEGATIVE SARS-CoV-2 target nucleic acids are NOT DETECTED. The SARS-CoV-2  RNA is generally detectable in upper and lower  respiratory specimens during the acute phase of infection. The lowest  concentration of SARS-CoV-2 viral copies this assay can detect is 250  copies / mL. A negative result does not preclude SARS-CoV-2 infection  and should not be used as the sole basis for treatment or other  patient management decisions.  A negative result may occur with  improper specimen collection / handling, submission of specimen other  than nasopharyngeal swab, presence of viral mutation(s) within the  areas targeted by this assay, and inadequate number of viral copies  (<250 copies / mL). A negative result must be combined with clinical  observations, patient history, and epidemiological information. If result is POSITIVE SARS-CoV-2 target nucleic acids are DETECTED. The SARS-CoV-2 RNA is generally detectable in upper and lower  respiratory specimens dur ing the acute phase of infection.  Positive  results are indicative of active infection with SARS-CoV-2.  Clinical  correlation with patient history and other diagnostic information is  necessary to determine patient infection status.  Positive results do  not rule out bacterial infection or co-infection with other viruses. If result is PRESUMPTIVE POSTIVE SARS-CoV-2 nucleic acids MAY BE PRESENT.   A presumptive positive result was obtained on the submitted specimen  and confirmed on repeat testing.  While 2019 novel coronavirus  (SARS-CoV-2) nucleic acids may be present in the submitted sample  additional confirmatory testing may be necessary for epidemiological  and / or clinical management purposes  to differentiate between  SARS-CoV-2 and other Sarbecovirus currently known to infect humans.  If clinically indicated additional testing with an alternate test  methodology [phone]) is advised. The SARS-CoV-2 RNA is generally  detectable in upper and lower respiratory sp ecimens during the acute  phase of  infection. The expected result is Negative. Fact Sheet for Patients:  StrictlyIdeas.no Fact Sheet for Healthcare Providers: BankingDealers.co.za This test is not yet approved or cleared by the [place] FDA and has been authorized for detection and/or diagnosis of SARS-CoV-2 by FDA under an Emergency Use Authorization (EUA).  This EUA will remain in effect (meaning this test can be used) for the duration of  the COVID-19 declaration under Section 564(b)(1) of the Act, 21 U.S.C. section 360bbb-3(b)(1), unless the authorization is terminated or revoked sooner. Performed at [facility], [address] [place], [place] [address]   MRSA PCR Screening     Status: None   Collection Time: 05/06/19  7:52 AM   Specimen: Nasopharyngeal  Result Value Ref Range Status   MRSA by PCR NEGATIVE NEGATIVE Final    Comment:        The GeneXpert MRSA Assay (FDA approved for NASAL specimens only), is one component of a comprehensive MRSA colonization surveillance program. It is not intended to diagnose MRSA infection nor to guide or monitor treatment for MRSA infections. Performed at [facility], [address] [place], [place] [address]     Radiology Reports Dg Chest Portable 1 View  Result Date: 05/06/2019 CLINICAL DATA:  38 year old female with type 1 diabetes presenting with cough. EXAM: PORTABLE CHEST 1 VIEW COMPARISON:  Chest radiograph dated 01/16/2019 FINDINGS: The heart size and mediastinal contours are within normal limits. Both lungs are clear. The visualized skeletal structures are unremarkable. IMPRESSION: No active disease. Electronically Signed   By: Elgie CollardArash  Radparvar M.D.   On: 05/06/2019 03:06     CBC Recent Labs  Lab 05/06/19 0233 05/06/19 0550  WBC 8.0 13.0*  HGB 15.0 13.5  HCT 45.5 41.7  PLT 297 202  MCV 90.5 92.5  MCH 29.8 29.9  MCHC 33.0 32.4  RDW 13.2 13.2  LYMPHSABS 1.7  --   MONOABS 0.5   --   EOSABS 0.0  --   BASOSABS 0.1  --     Chemistries  Recent Labs  Lab 05/06/19 0233 05/06/19 0550 05/06/19 1027  NA 133* 137 136  K 4.6 4.5 4.4  CL 95* 105 105  CO2 13* 17* 20*  GLUCOSE 400* 285* 165*  BUN 28* 27* 23*  CREATININE 1.10* 0.99 0.67  CALCIUM 9.5 8.5* 8.4*  AST 14*  --   --   ALT 16  --   --   ALKPHOS 95  --   --   BILITOT 2.0*  --   --    ------------------------------------------------------------------------------------------------------------------ estimated creatinine clearance is 83.9 mL/min (by C-G formula based on SCr of 0.67 mg/dL). ------------------------------------------------------------------------------------------------------------------ No results for input(s): HGBA1C in the last 72 hours. ------------------------------------------------------------------------------------------------------------------ No results for input(s): CHOL, HDL, LDLCALC, TRIG, CHOLHDL, LDLDIRECT in the last 72 hours. ------------------------------------------------------------------------------------------------------------------ No results for input(s): TSH, T4TOTAL, T3FREE, THYROIDAB in the last 72 hours.  Invalid input(s): FREET3 ------------------------------------------------------------------------------------------------------------------ No results for input(s): VITAMINB12, FOLATE, FERRITIN, TIBC, IRON, RETICCTPCT in the last 72 hours.  Coagulation profile No results for input(s): INR, PROTIME in the last 168 hours.  No results for input(s): DDIMER in the last 72 hours.  Cardiac Enzymes No results for input(s): CKMB, TROPONINI, MYOGLOBIN in the last 168 hours.  Invalid input(s): CK ------------------------------------------------------------------------------------------------------------------ Invalid input(s): POCBNP    Assessment & Plan   1.  DKA - Patient is anion gap is resolved. -She was started on insulin.  2.  Abdominal pain -We will  treat with IV analgesic  3.  Asthma - Continue albuterol nebs treatment  4.  Tobacco abuse - Tobacco cessation counseling provided  DVT and PPI prophylaxis initiated      Code Status Orders  (From admission, onward)         Start     Ordered   05/06/19 0329  Full code  Continuous     05/06/19 0328  Code Status History    Date Active Date Inactive Code Status Order ID Comments User Context   01/13/2019 1912 01/18/2019 1645 Full Code [id number]  Milagros LollSudini, Srikar, MD ED   08/07/2018 2251 08/09/2018 2113 Full Code [id number]  Oralia ManisWillis, David, MD ED   03/25/2018 1136 03/26/2018 1708 Full Code [id number]  Belva BertinHodge, Ellen W, RN Inpatient   03/22/2017 2140 03/24/2017 1842 Full Code [id number]  Enid BaasKalisetti, Radhika, MD Inpatient   10/02/2015 1355 10/03/2015 1830 Full Code [id number]  Altamese DillingVachhani, Vaibhavkumar, MD Inpatient   09/25/2015 0323 09/25/2015 2020 Full Code [id number]  Myra RudeSchmitz, Jeremy E, MD ED   07/25/2015 1733 07/27/2015 1525 Full Code [id number]  Zannie CoveJoseph, Preetha, MD Inpatient   05/03/2015 1522 05/05/2015 1634 Full Code [id number]  Onnie BoerEmokpae, Ejiroghene E, MD ED   02/09/2015 2256 02/11/2015 1641 Full Code [id number]  Raliegh IpGottschalk, Ashly M, DO Inpatient   12/21/2014 0843 12/23/2014 2053 Full Code [id number]  Bernadene PersonWhiteheart, Kathryn A, NP ED   08/20/2014 0543 08/22/2014 1903 Full Code [id number]  Oralia ManisWillis, David, MD Inpatient   12/02/2013 0210 12/03/2013 1700 Full Code [id number]  Leona Singletonhekkekandam, Maria T, MD Inpatient   11/15/2013 1532 11/20/2013 1631 Full Code [id number]  Jeanella Crazellis, Brandi L, NP ED   07/26/2013 2259 07/29/2013 1830 Full Code [id number]  Charm RingsHonig, Erin J, MD Inpatient   06/05/2013 2338 06/07/2013 1822 Full Code [id number]  Glori LuisSonnenberg, Eric G, MD Inpatient   06/05/2013 2338 06/05/2013 2338 Full Code [id number]  Glori LuisSonnenberg, Eric G, MD Inpatient   Advance Care Planning Activity           Consults pulmonary critical care   DVT Prophylaxis  Lovenox  Lab Results  Component Value Date   PLT 202 05/06/2019     Time Spent in  minutes 35 minutes spent 3 PM to 3:45 PM greater than 50% of time spent in care coordination and counseling patient regarding the condition and plan of care.   Auburn BilberryShreyang Henry Demeritt M.D on 05/06/2019 at 3:55 PM  Between 7am to 6pm - Pager - [phone]  After 6pm go to [email or web address] - Social research officer, governmentpassword [email or web address]  [employer]   Office  [phone]

## 2019-05-06 NOTE — ED Provider Notes (Addendum)
[facility] Emergency Department Provider Note  ____________________________________________  Time seen: Approximately 2:35 AM  I have reviewed the triage vital signs and the nursing notes.   HISTORY  Chief Complaint Hyperglycemia   HPI Kristine Allison is a 38 y.o. female with a history of type 1 diabetes and frequent admissions to the hospital for DKA who presents for evaluation of hyperglycemia.  Patient reports that her sugars have been high for 2 days now.  She has had 3-4 episodes of nonbloody nonbilious emesis.  No diarrhea, no fever, no abdominal pain.  She endorses a mild cough but no chest pain or shortness of breath.  She is complaining of low back pain which she always has when she is in DKA.  She denies dysuria or hematuria.   Past Medical History:  Diagnosis Date  . Anxiety   . Asthma   . Diabetes mellitus without complication ([place])   . BWLSLHTD(428.7)     Patient Active Problem List   Diagnosis Date Noted  . Anxiety 08/07/2018  . DKA (diabetic ketoacidoses) ([place]) 08/07/2018  . Edema of the tongue 03/26/2018  . Dehydration   . Diabetic ketoacidosis without coma associated with type 1 diabetes mellitus ([place])   . Yeast infection 02/09/2015  . Acute renal failure syndrome ([place])   . Tachycardia 11/03/2014  . Insomnia 09/08/2014  . Dizziness 09/08/2014  . Migraine, unspecified 12/14/2013  . Elevated troponin I level 11/19/2013  . Acute myocardial infarction, subendocardial infarction, initial episode of care ([place]) 11/18/2013  . Noncompliance with medication regimen 11/15/2013  . Leg pain 09/08/2013  . Loss of weight 09/08/2013  . Depression 09/08/2013  . Hypotension, unspecified 09/08/2013  . Tobacco use disorder 08/01/2013  . Diabetes mellitus type 1, uncontrolled ([place]) 06/24/2012  . GERD (gastroesophageal reflux disease) 06/17/2012    Past Surgical History:  Procedure Laterality Date  . NO PAST SURGERIES      Prior to  Admission medications   Medication Sig Start Date End Date Taking? Authorizing Provider  insulin NPH-regular Human (NOVOLIN 70/30) (70-30) 100 UNIT/ML injection 20 units subcu twice daily Patient taking differently: Inject 12-20 Units into the skin 3 (three) times daily. Per sliding scale 03/26/18   Epifanio Lesches, MD  ondansetron (ZOFRAN) 4 MG tablet Take 1 tablet (4 mg total) by mouth every 6 (six) hours as needed for nausea. 01/18/19   Vaughan Basta, MD  sertraline (ZOLOFT) 25 MG tablet Take 1 tablet (25 mg total) by mouth daily. 01/19/19   Vaughan Basta, MD    Allergies Latex  Family History  Problem Relation Age of Onset  . Heart disease Other        No family history    Social History Social History   Tobacco Use  . Smoking status: Current Every Day Smoker    Packs/day: 0.25    Years: 20.00    Pack years: 5.00    Types: Cigarettes  . Smokeless tobacco: Never Used  Substance Use Topics  . Alcohol use: No    Alcohol/week: 0.0 standard drinks  . Drug use: No    Review of Systems  Constitutional: Negative for fever. Eyes: Negative for visual changes. ENT: Negative for sore throat. Neck: No neck pain  Cardiovascular: Negative for chest pain. Respiratory: Negative for shortness of breath. + cough Gastrointestinal: Negative for abdominal pain, diarrhea. + nausea and vomiting Genitourinary: Negative for dysuria. Musculoskeletal: + low back pain. Skin: Negative for rash. Neurological: Negative for headaches, weakness or numbness. Psych:  No SI or HI  ____________________________________________   PHYSICAL EXAM:  VITAL SIGNS: ED Triage Vitals  Enc Vitals Group     BP 05/06/19 0229 125/60     Pulse Rate 05/06/19 0229 (!) 126     Resp 05/06/19 0229 16     Temp 05/06/19 0229 98.3 F (36.8 C)     Temp Source 05/06/19 0229 Oral     SpO2 05/06/19 0229 96 %     Weight 05/06/19 0227 125 lb (56.7 kg)     Height 05/06/19 0227 5\' 4"  (1.626 m)      Head Circumference --      Peak Flow --      Pain Score 05/06/19 0226 8     Pain Loc --      Pain Edu? --      Excl. in GC? --     Constitutional: Alert and oriented, chronically ill appearing, appears much older than stated age.  HEENT:      Head: Normocephalic and atraumatic.         Eyes: Conjunctivae are normal. Sclera is non-icteric.       Mouth/Throat: Mucous membranes are dry.       Neck: Supple with no signs of meningismus. Cardiovascular: Tachycardic with regular rhythm. No murmurs, gallops, or rubs. 2+ symmetrical distal pulses are present in all extremities. No JVD. Respiratory: Normal respiratory effort. Lungs are clear to auscultation bilaterally. No wheezes, crackles, or rhonchi.  Gastrointestinal: Soft, non tender, and non distended with positive bowel sounds. No rebound or guarding. Genitourinary: No CVA tenderness. Musculoskeletal: Nontender with normal range of motion in all extremities. No edema, cyanosis, or erythema of extremities. Neurologic: Normal speech and language. Face is symmetric. Moving all extremities. No gross focal neurologic deficits are appreciated. Skin: Skin is warm, dry and intact. No rash noted. Psychiatric: Mood and affect are normal. Speech and behavior are normal.  ____________________________________________   LABS (all labs ordered are listed, but only abnormal results are displayed)  Labs Reviewed  CBC WITH DIFFERENTIAL/PLATELET - Abnormal; Notable for the following components:      Result Value   Abs Immature Granulocytes 0.14 (*)    All other components within normal limits  COMPREHENSIVE METABOLIC PANEL - Abnormal; Notable for the following components:   Sodium 133 (*)    Chloride 95 (*)    CO2 13 (*)    Glucose, Bld 400 (*)    BUN 28 (*)    Creatinine, Ser 1.10 (*)    AST 14 (*)    Total Bilirubin 2.0 (*)    Anion gap 25 (*)    All other components within normal limits  BLOOD GAS, VENOUS - Abnormal; Notable for the  following components:   pH, Ven 7.24 (*)    pCO2, Ven 34 (*)    pO2, Ven 62.0 (*)    Bicarbonate 14.6 (*)    Acid-base deficit 11.8 (*)    All other components within normal limits  GLUCOSE, CAPILLARY - Abnormal; Notable for the following components:   Glucose-Capillary 410 (*)    All other components within normal limits  SARS CORONAVIRUS 2  URINALYSIS, COMPLETE (UACMP) WITH MICROSCOPIC  CBG MONITORING, ED   ____________________________________________  EKG  ED ECG REPORT I, Nita Sicklearolina Sina Lucchesi, the attending physician, personally viewed and interpreted this ECG.  Sinus tachycardia, rate of 123, normal intervals, right axis deviation, no ST elevations or depressions.  Unchanged from prior. ____________________________________________  RADIOLOGY  I have personally reviewed the images performed  during this visit and I agree with the Radiologist's read.   Interpretation by Radiologist:  Dg Chest Portable 1 View  Result Date: 05/06/2019 CLINICAL DATA:  38 year old female with type 1 diabetes presenting with cough. EXAM: PORTABLE CHEST 1 VIEW COMPARISON:  Chest radiograph dated 01/16/2019 FINDINGS: The heart size and mediastinal contours are within normal limits. Both lungs are clear. The visualized skeletal structures are unremarkable. IMPRESSION: No active disease. Electronically Signed   By: Elgie CollardArash  Radparvar M.D.   On: 05/06/2019 03:06      ____________________________________________   PROCEDURES  Procedure(s) performed: None Procedures Critical Care performed: yes  CRITICAL CARE Performed by: Nita Sicklearolina Tiajah Oyster  ?  Total critical care time: 35 min  Critical care time was exclusive of separately billable procedures and treating other patients.  Critical care was necessary to treat or prevent imminent or life-threatening deterioration.  Critical care was time spent personally by me on the following activities: development of treatment plan with patient and/or  surrogate as well as nursing, discussions with consultants, evaluation of patient's response to treatment, examination of patient, obtaining history from patient or surrogate, ordering and performing treatments and interventions, ordering and review of laboratory studies, ordering and review of radiographic studies, pulse oximetry and re-evaluation of patient's condition.  ____________________________________________   INITIAL IMPRESSION / ASSESSMENT AND PLAN / ED COURSE   38 y.o. female with a history of type 1 diabetes and frequent admissions to the hospital for DKA who presents for evaluation of hyperglycemia.  Patient looks dry.  Blood glucose of 410 on arrival.  Will check labs to rule out DKA.  Will check urinalysis to rule out UTI.  Will check chest x-ray to rule out pneumonia.  Will give IV fluids and Zofran.  Clinical Course as of May 05 321  Fri May 06, 2019  0321 Labs consistent with DKA. pH 7.24. Patient given IVF bolus and started on insulin gtt. Normal mentation. Will admit to hospitalist   [CV]    Clinical Course User Index [CV] Don PerkingVeronese, [place], MD      As part of my medical decision making, I reviewed the following data within the electronic MEDICAL RECORD NUMBER Nursing notes reviewed and incorporated, Labs reviewed , Old chart reviewed, Radiograph reviewed , Discussed with admitting physician , Notes from prior ED visits and [place] Controlled Substance Database   Patient was evaluated in Emergency Department today for the symptoms described in the history of present illness. Patient was evaluated in the context of the global COVID-19 pandemic, which necessitated consideration that the patient might be at risk for infection with the SARS-CoV-2 virus that causes COVID-19. Institutional protocols and algorithms that pertain to the evaluation of patients at risk for COVID-19 are in a state of rapid change based on information released by regulatory bodies including the CDC and federal  and state organizations. These policies and algorithms were followed during the patient's care in the ED.   ____________________________________________   FINAL CLINICAL IMPRESSION(S) / ED DIAGNOSES   Final diagnoses:  Diabetic ketoacidosis without coma associated with type 1 diabetes mellitus (HCC)      NEW MEDICATIONS STARTED DURING THIS VISIT:  ED Discharge Orders    None       Note:  This document was prepared using Dragon voice recognition software and may include unintentional dictation errors.    Don PerkingVeronese, [place], MD 05/06/19 [id number]    Nita SickleVeronese, [place], MD 05/06/19 [phone]

## 2019-05-06 NOTE — ED Triage Notes (Addendum)
Pt to ED via EMS from home. Pt states yesterday her sugars were in the 500s, pt is type 1 diabetic,takes 70/30. Per ems pts cbg was 370. Pt was given 200cc NS with ems. Pt has n/v

## 2019-05-06 NOTE — H&P (Signed)
[facility] NAME: Kristine Allison    MR#:  [id number]  DATE OF BIRTH:  04/27/1981  DATE OF ADMISSION:  05/06/2019  PRIMARY CARE PHYSICIAN: Patient, No Pcp Per   REQUESTING/REFERRING PHYSICIAN:   CHIEF COMPLAINT:   Chief Complaint  Patient presents with  . Hyperglycemia    HISTORY OF PRESENT ILLNESS:  Kristine Allison  is a 38 y.o. female with a known history of diabetes mellitus type 1, anxiety, asthma.  She has a history of frequent hospital admissions for DKA.  She presented to the emergency room from home for evaluation of hyperglycemia.  She reports a 2-day history of glucose 300-500.  She is having frequent episodes of nausea and vomiting.  She denies diarrhea, fever, chills, chest pain, shortness of breath, or cough.  She denies hematemesis, hematochezia, or melena.  She reports pain is epigastric in nature radiating through to her back which is typical for her when she is in DKA according to her report.  She denies experiencing dysuria or hematuria.  She denies dark or malodorous urine.  She denies urine urgency or frequency.  Chest x-ray was completed with no acute pulmonary disease.  Glucose on arrival is 400.  Venous blood gas was obtained with pH of 7.24 and bicarbonate 14.6.  Anion gap is 25.  She has been admitted to the hospitalist service with care to be transferred to the intensivist in the ICU stepdown unit.  PAST MEDICAL HISTORY:   Past Medical History:  Diagnosis Date  . Anxiety   . Asthma   . Diabetes mellitus without complication ([place])   . Headache(784.0)     PAST SURGICAL HISTORY:   Past Surgical History:  Procedure Laterality Date  . NO PAST SURGERIES      SOCIAL HISTORY:   Social History   Tobacco Use  . Smoking status: Current Every Day Smoker    Packs/day: 0.25    Years: 20.00    Pack years: 5.00    Types: Cigarettes  . Smokeless tobacco: Never Used  Substance Use Topics  .  Alcohol use: No    Alcohol/week: 0.0 standard drinks    FAMILY HISTORY:   Family History  Problem Relation Age of Onset  . Heart disease Other        No family history    DRUG ALLERGIES:   Allergies  Allergen Reactions  . Latex Rash    REVIEW OF SYSTEMS:   Review of Systems  Constitutional: Positive for malaise/fatigue. Negative for chills and fever.  HENT: Negative for congestion, sinus pain and sore throat.   Eyes: Negative for blurred vision and double vision.  Respiratory: Negative for cough, sputum production, shortness of breath and wheezing.   Cardiovascular: Negative for chest pain and palpitations.  Gastrointestinal: Positive for abdominal pain, nausea and vomiting. Negative for blood in stool, constipation, diarrhea, heartburn and melena.  Genitourinary: Negative for dysuria, flank pain, frequency and hematuria.  Musculoskeletal: Negative for falls, joint pain and myalgias.  Skin: Negative for itching and rash.  Neurological: Negative for dizziness, weakness and headaches.  Psychiatric/Behavioral: Negative for depression.    MEDICATIONS AT HOME:   Prior to Admission medications   Medication Sig Start Date End Date Taking? Authorizing Provider  insulin NPH-regular Human (NOVOLIN 70/30) (70-30) 100 UNIT/ML injection 20 units subcu twice daily Patient taking differently: Inject 12-20 Units into the skin 3 (three) times daily. Per sliding scale 03/26/18   Epifanio Lesches, MD  ondansetron (ZOFRAN) 4 MG tablet Take 1 tablet (4 mg total) by mouth every 6 (six) hours as needed for nausea. 01/18/19   Altamese DillingVachhani, Vaibhavkumar, MD  sertraline (ZOLOFT) 25 MG tablet Take 1 tablet (25 mg total) by mouth daily. 01/19/19   Altamese DillingVachhani, Vaibhavkumar, MD      VITAL SIGNS:  Blood pressure 125/60, pulse (!) 126, temperature 98.3 F (36.8 C), temperature source Oral, resp. rate 16, height 5\' 4"  (1.626 m), weight 56.7 kg, last menstrual period 10/23/2013, SpO2 96 %.  PHYSICAL  EXAMINATION:  Physical Exam  GENERAL:  38 y.o.-year-old patient lying in the bed with no acute distress.  EYES: Pupils equal, round, reactive to light and accommodation. No scleral icterus. Extraocular muscles intact.  HEENT: Head atraumatic, normocephalic. Oropharynx and nasopharynx clear.  NECK:  Supple, no jugular venous distention. No thyroid enlargement, no tenderness.  LUNGS: Normal breath sounds bilaterally, no wheezing, rales,rhonchi or crepitation. No use of accessory muscles of respiration.  CARDIOVASCULAR: Regular rate and rhythm, S1, S2 normal. No murmurs, rubs, or gallops.  ABDOMEN: Soft, nondistended, with epigastric tenderness. bowel sounds present. No organomegaly or mass.  EXTREMITIES: No pedal edema, cyanosis, or clubbing.  NEUROLOGIC: Cranial nerves II through XII are intact. Muscle strength 5/5 in all extremities. Sensation intact. Gait not checked.  PSYCHIATRIC: The patient is alert and oriented x 3.  Normal affect and good eye contact. SKIN: No obvious rash, lesion, or ulcer.   LABORATORY PANEL:   CBC Recent Labs  Lab 05/06/19 0233  WBC 8.0  HGB 15.0  HCT 45.5  PLT 297   ------------------------------------------------------------------------------------------------------------------  Chemistries  Recent Labs  Lab 05/06/19 0233  NA 133*  K 4.6  CL 95*  CO2 13*  GLUCOSE 400*  BUN 28*  CREATININE 1.10*  CALCIUM 9.5  AST 14*  ALT 16  ALKPHOS 95  BILITOT 2.0*   ------------------------------------------------------------------------------------------------------------------  Cardiac Enzymes No results for input(s): TROPONINI in the last 168 hours. ------------------------------------------------------------------------------------------------------------------  RADIOLOGY:  Dg Chest Portable 1 View  Result Date: 05/06/2019 CLINICAL DATA:  38 year old female with type 1 diabetes presenting with cough. EXAM: PORTABLE CHEST 1 VIEW COMPARISON:  Chest  radiograph dated 01/16/2019 FINDINGS: The heart size and mediastinal contours are within normal limits. Both lungs are clear. The visualized skeletal structures are unremarkable. IMPRESSION: No active disease. Electronically Signed   By: Elgie CollardArash  Radparvar M.D.   On: 05/06/2019 03:06      IMPRESSION AND PLAN:   1.  DKA - N.p.o. -Insulin drip initiated -Patient is being admitted to the ICU for insulin infusion and close monitoring -We will repeat BMP per DKA protocol every 4 hours. - We will treat nausea and vomiting with IV antiemetic -We will treat pain with analgesic -We will consult diabetic educator for persistent hyperglycemia  2.  Abdominal pain -We will treat with IV analgesic  3.  Asthma - We will treat with albuterol nebulized treatment as needed for shortness of breath and/or wheezing  4.  Tobacco abuse - Tobacco cessation counseling provided  DVT and PPI prophylaxis initiated   All the records are reviewed and case discussed with ED provider. The plan of care was discussed in details with the patient (and family). I answered all questions. The patient agreed to proceed with the above mentioned plan. Further management will depend upon hospital course.   CODE STATUS: Full code  TOTAL TIME TAKING CARE OF THIS PATIENT: 45minutes.    Milas Kocherngela H Wing Gfeller CRNP on 05/06/2019 at 3:56 AM  Pager - [phone]  After 6pm go to [email or web address] - Proofreader  [facility]  Office  [phone]  CC: Primary care physician; Patient, No Pcp Per   Note: This dictation was prepared with Dragon dictation along with smaller phrase technology. Any transcriptional errors that result from this process are unintentional.

## 2019-05-06 NOTE — Progress Notes (Addendum)
Inpatient Diabetes Program Recommendations  AACE/ADA: New Consensus Statement on Inpatient Glycemic Control   Target Ranges:  Prepandial:   less than 140 mg/dL      Peak postprandial:   less than 180 mg/dL (1-2 hours)      Critically ill patients:  140 - 180 mg/dL   Results for Crista ElliotSIMMONS COHN, Keirah E (MRN [id number]) as of 05/06/2019 07:49  Ref. Range 05/06/2019 02:32 05/06/2019 04:29 05/06/2019 06:31  Glucose-Capillary Latest Ref Range: 70 - 99 mg/dL 782410 (H) 956371 (H) 213220 (H)  Results for Crista ElliotSIMMONS COHN, Yui E (MRN [id number]) as of 05/06/2019 07:49  Ref. Range 05/06/2019 02:33  Glucose Latest Ref Range: 70 - 99 mg/dL 629400 (H)  Results for Crista ElliotSIMMONS COHN, Nazyia E (MRN [id number]) as of 05/06/2019 07:49  Ref. Range 01/14/2019 05:05  Hemoglobin A1C Latest Ref Range: 4.8 - 5.6 % 10.9 (H)   Review of Glycemic Control  Diabetes history: DM1 (makes NO insulin; requires basal, correction, and meal coverage insulin) Outpatient Diabetes medications:70/30 12-20 units BID Current orders for Inpatient glycemic control: IV insulin per DKA  Inpatient Diabetes Program Recommendations:   At time of transition from IV to SQ insulin: Once acidosis is cleared and MD ready to transition from IV to SQ insulin, please consider ordering Lantus 17 units Q24H (based on 56.7 kg x 0.3 units), CBGs aC&HS, Novolog 0-9 units TID with meals, Novolog 0-5 units QHS, and when diet ordered Novolog 3 units TID with meals for meal coverage. If MD prefers to use 70/30 insulin (as used outpatient) instead of Lantus and Novolog meal coverage; then would recommend ordering 70/30 12 units BID (dose will provide 16.8 units for basal and 7.2 units for meal coverage per day), CBGs AC&HS, Novolog 0-9 units TID with meals and Novolog 0-5 units QHS.  HbgA1C: Please consider ordering an A1C to evaluate glycemic control over the past 2-3 months.  NOTE: Noted consult for Diabetes Coordinator. Patient is known to Inpatient Diabetes team as she has  had multiple admissions for DKA. Patient has Type 1 DM, no insurance or PCP. Per note on 01/14/19 by J. Simpson, RN, Diabetes Coordinator patient reports that she gets Novolin 70/30 insulin from [facility] over the counter, she does not check glucose, does not have a PCP, or insurance. Case Manager provided patient with application for [facility] on 01/15/19 during last hospital admission. Will follow up with patient today.  Addendum: Spoke with patient regarding DM control and outpatient medication regimen. Patient lying in bed and kept her eyes closed most of the conversation but would answer questions. Patient states that she is taking 70/30 10 units TID with meals.  Patient states that she gets Novolin 70/30 [facility] and that she does not check glucose because she does not have a glucometer or testing supplies.  Patient reports that she checks her glucose from time to time with her mothers glucometer and she reports her glucose is usually in the 100's mg/dl.  Patient states that she does not have any insurance or PCP at this time, she works full time but does not have any insurance benefits.  Inquired about [facility]. Patient states she filled out the application she was given at last hospital admission in April but she was told that she made too much money and would have to pay $20 or $25 dollars to see a provider at the [facility]. Patient states that she can not afford to pay  that much money to see a providers. Explained to patient that since she has Type 1 DM, she requires insulin, and it is important for her to maintain good DM control to decrease her risk of complications from uncontrolled DM. Discussed how hyperglycemia damages inner lining of blood vessels and leads to complications. Discussed complications of uncontrolled DM in detail and explained that she was only 38 years old and if she did not keep DM controlled she is  likely going to have many of those complications. Encouraged patient to consider going to [facility] at least to see a provider 2-3 times a year so they can help with DM management. Informed patient that CM would be consulted and asked to provide a glucometer and testing supplies if they have one available. Informed patient that the Reli-On Classic glucometer was $9 at [facility] and a box of 50 test strips is $9 if CM does not have one to provide her with. Patient verbalized understanding of information discussed and states that she does not have any questions.  Thanks, Barnie Alderman, RN, MSN, CDE Diabetes Coordinator Inpatient Diabetes Program [phone] (Team Pager from 8am to 5pm)

## 2019-05-06 NOTE — ED Notes (Signed)
ED TO INPATIENT HANDOFF REPORT  ED Nurse Name and Phone #: Cala BradfordKimberly [phone]  S Name/Age/Gender Kristine Allison 38 y.o. female Room/Bed: ED08A/ED08A  Code Status   Code Status: Full Code  Home/SNF/Other Home Patient oriented to: self, place, time and situation Is this baseline? Yes   Triage Complete: Triage complete  Chief Complaint Hyperglycemia  Triage Note Pt to ED via EMS from home. Pt states yesterday her sugars were in the 500s, pt is type 1 diabetic,takes 70/30. Per ems pts cbg was 370. Pt was given 200cc NS with ems. Pt has n/v    Allergies Allergies  Allergen Reactions  . Latex Rash    Level of Care/Admitting Diagnosis ED Disposition    ED Disposition Condition Comment   Admit  [facility] [[id number]]  Level of Care: Stepdown [14]  Covid Evaluation: Asymptomatic Screening Protocol (No Symptoms)  Diagnosis: DKA (diabetic ketoacidoses) [facility](H[facility]) [[id number]][[id number]]  Admitting Physician: Pearletha AlfredSEALS, ANGELA H [[id number]]  Attending Physician: Pearletha AlfredSEALS, ANGELA H [[id number]]  Estimated length of stay: past midnight tomorrow  Certification:: I certify this patient will need inpatient services for at least 2 midnights  PT Class (Do Not Modify): Inpatient [101]  PT Acc Code (Do Not Modify): Private [1]       B Medical/Surgery History Past Medical History:  Diagnosis Date  . Anxiety   . Asthma   . Diabetes mellitus without complication (HCC)   . GEXBMWUX(324.4Headache(784.0)    Past Surgical History:  Procedure Laterality Date  . NO PAST SURGERIES       A IV Location/Drains/Wounds Patient Lines/Drains/Airways Status   Active Line/Drains/Airways    Name:   Placement date:   Placement time:   Site:   Days:   Peripheral IV 05/06/19 Right Antecubital   05/06/19    0231    Antecubital   less than 1   Peripheral IV 05/06/19 Right Antecubital   05/06/19    0350    Antecubital   less than 1          Intake/Output Last 24 hours No intake or output data  in the 24 hours ending 05/06/19 0448  Labs/Imaging Results for orders placed or performed during the hospital encounter of 05/06/19 (from the past 48 hour(s))  Glucose, capillary     Status: Abnormal   Collection Time: 05/06/19  2:32 AM  Result Value Ref Range   Glucose-Capillary 410 (H) 70 - 99 mg/dL  CBC with Differential/Platelet     Status: Abnormal   Collection Time: 05/06/19  2:33 AM  Result Value Ref Range   WBC 8.0 4.0 - 10.5 K/uL   RBC 5.03 3.87 - 5.11 MIL/uL   Hemoglobin 15.0 12.0 - 15.0 g/dL   HCT 01.045.5 27.236.0 - 53.646.0 %   MCV 90.5 80.0 - 100.0 fL   MCH 29.8 26.0 - 34.0 pg   MCHC 33.0 30.0 - 36.0 g/dL   RDW 64.413.2 03.411.5 - 74.215.5 %   Platelets 297 150 - 400 K/uL   nRBC 0.0 0.0 - 0.2 %   Neutrophils Relative % 70 %   Neutro Abs 5.6 1.7 - 7.7 K/uL   Lymphocytes Relative 21 %   Lymphs Abs 1.7 0.7 - 4.0 K/uL   Monocytes Relative 6 %   Monocytes Absolute 0.5 0.1 - 1.0 K/uL   Eosinophils Relative 0 %   Eosinophils Absolute 0.0 0.0 - 0.5 K/uL   Basophils Relative 1 %   Basophils Absolute 0.1 0.0 - 0.1 K/uL  Immature Granulocytes 2 %   Abs Immature Granulocytes 0.14 (H) 0.00 - 0.07 K/uL    Comment: Performed at [facility], [address] [place], [place] [address]  Comprehensive metabolic panel     Status: Abnormal   Collection Time: 05/06/19  2:33 AM  Result Value Ref Range   Sodium 133 (L) 135 - 145 mmol/L    Comment: REPEATED ELECTROLYTES SRC   Potassium 4.6 3.5 - 5.1 mmol/L   Chloride 95 (L) 98 - 111 mmol/L   CO2 13 (L) 22 - 32 mmol/L   Glucose, Bld 400 (H) 70 - 99 mg/dL   BUN 28 (H) 6 - 20 mg/dL   Creatinine, Ser 6.041.10 (H) 0.44 - 1.00 mg/dL   Calcium 9.5 8.9 - 54.010.3 mg/dL   Total Protein 7.8 6.5 - 8.1 g/dL   Albumin 4.7 3.5 - 5.0 g/dL   AST 14 (L) 15 - 41 U/L   ALT 16 0 - 44 U/L   Alkaline Phosphatase 95 38 - 126 U/L   Total Bilirubin 2.0 (H) 0.3 - 1.2 mg/dL   GFR calc non Af Amer >60 >60 mL/min   GFR calc Af Amer >60 >60 mL/min   Anion gap 25 (H) 5 - 15     Comment: Performed at [facility], [address] [place], [place] [address]  Blood gas, venous     Status: Abnormal   Collection Time: 05/06/19  2:33 AM  Result Value Ref Range   pH, Ven 7.24 (L) 7.250 - 7.430   pCO2, Ven 34 (L) 44.0 - 60.0 mmHg   pO2, Ven 62.0 (H) 32.0 - 45.0 mmHg   Bicarbonate 14.6 (L) 20.0 - 28.0 mmol/L   Acid-base deficit 11.8 (H) 0.0 - 2.0 mmol/L   O2 Saturation 86.6 %   Patient temperature 37.0    Collection site VENOUS    Sample type VENOUS     Comment: Performed at [facility], [address] [place], [place] [address]  SARS Coronavirus 2 [facility](Hospital order, Performed in [facility] hospital lab) Nasopharyngeal Nasopharyngeal Swab     Status: None   Collection Time: 05/06/19  3:28 AM   Specimen: Nasopharyngeal Swab  Result Value Ref Range   SARS Coronavirus 2 NEGATIVE NEGATIVE    Comment: (NOTE) If result is NEGATIVE SARS-CoV-2 target nucleic acids are NOT DETECTED. The SARS-CoV-2 RNA is generally detectable in upper and lower  respiratory specimens during the acute phase of infection. The lowest  concentration of SARS-CoV-2 viral copies this assay can detect is 250  copies / mL. A negative result does not preclude SARS-CoV-2 infection  and should not be used as the sole basis for treatment or other  patient management decisions.  A negative result may occur with  improper specimen collection / handling, submission of specimen other  than nasopharyngeal swab, presence of viral mutation(s) within the  areas targeted by this assay, and inadequate number of viral copies  (<250 copies / mL). A negative result must be combined with clinical  observations, patient history, and epidemiological information. If result is POSITIVE SARS-CoV-2 target nucleic acids are DETECTED. The SARS-CoV-2 RNA is generally detectable in upper and lower  respiratory specimens dur ing the acute phase of infection.  Positive  results are indicative of  active infection with SARS-CoV-2.  Clinical  correlation with patient history and other diagnostic information is  necessary to determine patient infection status.  Positive results do  not rule out bacterial infection or co-infection with  other viruses. If result is PRESUMPTIVE POSTIVE SARS-CoV-2 nucleic acids MAY BE PRESENT.   A presumptive positive result was obtained on the submitted specimen  and confirmed on repeat testing.  While 2019 novel coronavirus  (SARS-CoV-2) nucleic acids may be present in the submitted sample  additional confirmatory testing may be necessary for epidemiological  and / or clinical management purposes  to differentiate between  SARS-CoV-2 and other Sarbecovirus currently known to infect humans.  If clinically indicated additional testing with an alternate test  methodology [phone]) is advised. The SARS-CoV-2 RNA is generally  detectable in upper and lower respiratory sp ecimens during the acute  phase of infection. The expected result is Negative. Fact Sheet for Patients:  StrictlyIdeas.no Fact Sheet for Healthcare Providers: BankingDealers.co.za This test is not yet approved or cleared by the [place] FDA and has been authorized for detection and/or diagnosis of SARS-CoV-2 by FDA under an Emergency Use Authorization (EUA).  This EUA will remain in effect (meaning this test can be used) for the duration of the COVID-19 declaration under Section 564(b)(1) of the Act, 21 U.S.C. section 360bbb-3(b)(1), unless the authorization is terminated or revoked sooner. Performed at [facility], [place], [place] [address]   Glucose, capillary     Status: Abnormal   Collection Time: 05/06/19  4:29 AM  Result Value Ref Range   Glucose-Capillary 371 (H) 70 - 99 mg/dL   Dg Chest Portable 1 View  Result Date: 05/06/2019 CLINICAL DATA:  38 year old female with type 1 diabetes presenting  with cough. EXAM: PORTABLE CHEST 1 VIEW COMPARISON:  Chest radiograph dated 01/16/2019 FINDINGS: The heart size and mediastinal contours are within normal limits. Both lungs are clear. The visualized skeletal structures are unremarkable. IMPRESSION: No active disease. Electronically Signed   By: Anner Crete M.D.   On: 05/06/2019 03:06    Pending Labs Unresulted Labs (From admission, onward)    Start     Ordered   05/13/19 0500  Creatinine, serum  (enoxaparin (LOVENOX)    CrCl >/= 30 ml/min)  Weekly,   STAT    Comments: while on enoxaparin therapy    05/06/19 0328   05/06/19 8315  Basic metabolic panel  STAT Now then every 4 hours ,   STAT     05/06/19 0328   05/06/19 0329  CBC  (enoxaparin (LOVENOX)    CrCl >/= 30 ml/min)  Once,   STAT    Comments: Baseline for enoxaparin therapy IF NOT ALREADY DRAWN.  Notify MD if PLT < 100 K.    05/06/19 0328   05/06/19 0228  Urinalysis, Complete w Microscopic  ONCE - STAT,   STAT     05/06/19 0228          Vitals/Pain Today's Vitals   05/06/19 0226 05/06/19 0227 05/06/19 0229  BP:   125/60  Pulse:   (!) 126  Resp:   16  Temp:   98.3 F (36.8 C)  TempSrc:   Oral  SpO2:   96%  Weight:  56.7 kg   Height:  5\' 4"  (1.626 m)   PainSc: 8       Isolation Precautions Airborne and Contact precautions  Medications Medications  dextrose 5 %-0.45 % sodium chloride infusion ( Intravenous Not Given 05/06/19 0342)  0.9 %  sodium chloride infusion ( Intravenous New Bag/Given 05/06/19 0324)  sertraline (ZOLOFT) tablet 25 mg (has no administration in time range)  0.9 %  sodium chloride infusion ( Intravenous Not Given  05/06/19 0338)  dextrose 5 %-0.45 % sodium chloride infusion (has no administration in time range)  insulin regular, human (MYXREDLIN) 100 units/ 100 mL infusion (6.2 Units/hr Intravenous Rate/Dose Change 05/06/19 0433)  enoxaparin (LOVENOX) injection 40 mg (has no administration in time range)  0.9 %  sodium chloride infusion (  Intravenous New Bag/Given 05/06/19 0356)  potassium chloride 10 mEq in 100 mL IVPB (has no administration in time range)  albuterol (PROVENTIL) (2.5 MG/3ML) 0.083% nebulizer solution 2.5 mg (has no administration in time range)  sodium chloride 0.9 % bolus 1,000 mL (0 mLs Intravenous Stopped 05/06/19 0324)  ondansetron (ZOFRAN) injection 4 mg (4 mg Intravenous Given 05/06/19 0237)    Mobility walks Low fall risk   Focused Assessments DKA   R Recommendations: See Admitting Provider Note  Report given to:   Additional Notes:

## 2019-05-06 NOTE — ED Notes (Signed)
Patient able to use bedpan.  

## 2019-05-07 LAB — BASIC METABOLIC PANEL
Anion gap: 11 (ref 5–15)
BUN: 16 mg/dL (ref 6–20)
CO2: 22 mmol/L (ref 22–32)
Calcium: 8.4 mg/dL — ABNORMAL LOW (ref 8.9–10.3)
Chloride: 99 mmol/L (ref 98–111)
Creatinine, Ser: 0.86 mg/dL (ref 0.44–1.00)
GFR calc Af Amer: >60 mL/min (ref >60)
GFR calc non Af Amer: >60 mL/min (ref >60)
Glucose, Bld: 390 mg/dL — ABNORMAL HIGH (ref 70–99)
Potassium: 4.4 mmol/L (ref 3.5–5.1)
Sodium: 132 mmol/L — ABNORMAL LOW (ref 135–145)

## 2019-05-07 LAB — GLUCOSE, CAPILLARY
Glucose-Capillary: 381 mg/dL — ABNORMAL HIGH (ref 70–99)
Glucose-Capillary: 226 mg/dL — ABNORMAL HIGH (ref 70–99)

## 2019-05-07 MED ORDER — INSULIN ASPART PROT & ASPART (70-30 MIX) 100 UNIT/ML ~~LOC~~ SUSP: 18.0000 [IU] | Freq: Two times a day (BID) | SUBCUTANEOUS | Status: DC

## 2019-05-07 MED ORDER — TRAMADOL HCL 50 MG PO TABS
50.0000 mg | ORAL_TABLET | Freq: Four times a day (QID) | ORAL | Status: DC | PRN
  Administered 2019-05-07: 50 mg via ORAL
  Filled 2019-05-07: qty 1

## 2019-05-07 MED ORDER — ENOXAPARIN SODIUM 40 MG/0.4ML ~~LOC~~ SOLN: 40.0000 mg | SUBCUTANEOUS | Status: DC

## 2019-05-07 MED ORDER — ACETAMINOPHEN 325 MG PO TABS
650.0000 mg | ORAL_TABLET | ORAL | Status: DC | PRN
  Administered 2019-05-07: 07:00:00 650 mg via ORAL
  Filled 2019-05-07: qty 2

## 2019-05-07 NOTE — TOC Initial Note (Signed)
Transition of Care [facility](TOC) - Initial/Assessment Note    Patient Details  Name: Kristine Allison MRN: [id number] Date of Birth: [birth year]  Transition of Care [facility](TOC) CM/SW Contact:    Allayne ButcherJeanna M Tracer Gutridge, RN Phone Number: 05/07/2019, 9:13 AM  Clinical Narrative:                 Patient admitted with DKA she is a type 1 Diabetic.  Patient reports that she does not have a working glucometer and she does not currently have a PCP or insurance.  Patient provided with a new Reli-on Glucometer, lancet, and test strips donated by the case management department.  Patient given a resource booklet for free and low cost healthcare in [facility] [facility] [place] [place] it has a Horticulturist, commerciallist of indigent clinics in [place].  [facility] and [facility] Application also given to patient, she can fill out and return to [facility] or Medication Management the application is for both.  Patient reports that she has been denied by Med Management in the past for making too much money.  Patient reports that she does work full time but she does not make a lot of money.  Currently patient is getting her insulin from [facility] for $25- no prescription needed.  Patient also provided with a good rx prescription drug savings card- there is also a website and app for good rx. Patient denies any other needs at this time, she lives with her parents and sibling, she drives and has reliable transportation.   Expected Discharge Plan: Home/Self Care Barriers to Discharge: Continued Medical Work up   Patient Goals and CMS Choice        Expected Discharge Plan and Services Expected Discharge Plan: Home/Self Care   Discharge Planning Services: CM Consult, Medication Assistance, Indigent Health Clinic     Expected Discharge Date: 05/07/19                                    Prior Living Arrangements/Services   Lives with:: Parents, Siblings Patient language and need for interpreter reviewed:: No Do you feel safe going back to  the place where you live?: Yes      Need for Family Participation in Patient Care: Yes (Comment) Care giver support system in place?: Yes (comment)(parents and siblings)   Criminal Activity/Legal Involvement Pertinent to Current Situation/Hospitalization: No - Comment as needed  Activities of Daily Living Home Assistive Devices/Equipment: None ADL Screening (condition at time of admission) Patient's cognitive ability adequate to safely complete daily activities?: Yes Is the patient deaf or have difficulty hearing?: No Does the patient have difficulty seeing, even when wearing glasses/contacts?: No Does the patient have difficulty concentrating, remembering, or making decisions?: No Patient able to express need for assistance with ADLs?: Yes Does the patient have difficulty dressing or bathing?: No Independently performs ADLs?: Yes (appropriate for developmental age) Does the patient have difficulty walking or climbing stairs?: No Weakness of Legs: None Weakness of Arms/Hands: None  Permission Sought/Granted Permission sought to share information with : Case Manager Permission granted to share information with : Yes, Verbal Permission Granted              Emotional Assessment Appearance:: Appears stated age Attitude/Demeanor/Rapport: Engaged Affect (typically observed): Accepting Orientation: : Oriented to Self, Oriented to Place, Oriented to  Time, Oriented to Situation Alcohol / Substance Use: Not Applicable Psych Involvement: No (comment)  Admission diagnosis:  Diabetic  ketoacidosis without coma associated with type 1 diabetes mellitus ([place]) [E10.10] Patient Active Problem List   Diagnosis Date Noted  . Anxiety 08/07/2018  . DKA (diabetic ketoacidoses) ([place]) 08/07/2018  . Edema of the tongue 03/26/2018  . Dehydration   . Diabetic ketoacidosis without coma associated with type 1 diabetes mellitus ([place])   . Yeast infection 02/09/2015  . Acute renal failure syndrome ([place])    . Tachycardia 11/03/2014  . Insomnia 09/08/2014  . Dizziness 09/08/2014  . Migraine, unspecified 12/14/2013  . Elevated troponin I level 11/19/2013  . Acute myocardial infarction, subendocardial infarction, initial episode of care ([place]) 11/18/2013  . Noncompliance with medication regimen 11/15/2013  . Leg pain 09/08/2013  . Loss of weight 09/08/2013  . Depression 09/08/2013  . Hypotension, unspecified 09/08/2013  . Tobacco use disorder 08/01/2013  . Diabetes mellitus type 1, uncontrolled ([place]) 06/24/2012  . GERD (gastroesophageal reflux disease) 06/17/2012   PCP:  Patient, No Pcp Per Pharmacy:   [facility] [address] [place] [address] Phone: [phone] Fax: [phone]  [place] - [address] [place] [address] Phone: [phone] Fax: [phone]     Social Determinants of Health (SDOH) Interventions    Readmission Risk Interventions No flowsheet data found.

## 2019-05-07 NOTE — Discharge Instructions (Signed)
Resume diet and activity as before ° ° °

## 2019-05-07 NOTE — Progress Notes (Signed)
Patient discharged home per MD order. All discharge instructions given and all questions answered. 

## 2019-05-08 ENCOUNTER — Other Ambulatory Visit: Payer: Self-pay

## 2019-05-08 ENCOUNTER — Encounter: Payer: Self-pay | Admitting: Emergency Medicine

## 2019-05-08 ENCOUNTER — Inpatient Hospital Stay
Admission: EM | Admit: 2019-05-08 | Discharge: 2019-05-10 | DRG: 638 | Disposition: A | Discharge status: Home / Self Care | Payer: Self-pay | Attending: Internal Medicine | Admitting: Internal Medicine

## 2019-05-08 DIAGNOSIS — E440 Moderate protein-calorie malnutrition: Secondary | ICD-10-CM | POA: Insufficient documentation

## 2019-05-08 DIAGNOSIS — Z91040 Latex allergy status: Secondary | ICD-10-CM

## 2019-05-08 DIAGNOSIS — Z681 Body mass index (BMI) 19 or less, adult: Secondary | ICD-10-CM

## 2019-05-08 DIAGNOSIS — F17210 Nicotine dependence, cigarettes, uncomplicated: Secondary | ICD-10-CM | POA: Diagnosis present

## 2019-05-08 DIAGNOSIS — K219 Gastro-esophageal reflux disease without esophagitis: Secondary | ICD-10-CM | POA: Diagnosis present

## 2019-05-08 DIAGNOSIS — J45909 Unspecified asthma, uncomplicated: Secondary | ICD-10-CM | POA: Diagnosis present

## 2019-05-08 DIAGNOSIS — E1010 Type 1 diabetes mellitus with ketoacidosis without coma: Principal | ICD-10-CM | POA: Diagnosis present

## 2019-05-08 DIAGNOSIS — Z794 Long term (current) use of insulin: Secondary | ICD-10-CM

## 2019-05-08 DIAGNOSIS — E860 Dehydration: Secondary | ICD-10-CM | POA: Diagnosis present

## 2019-05-08 LAB — GLUCOSE, CAPILLARY
Glucose-Capillary: 206 mg/dL — ABNORMAL HIGH (ref 70–99)
Glucose-Capillary: 240 mg/dL — ABNORMAL HIGH (ref 70–99)
Glucose-Capillary: 242 mg/dL — ABNORMAL HIGH (ref 70–99)
Glucose-Capillary: 266 mg/dL — ABNORMAL HIGH (ref 70–99)
Glucose-Capillary: 457 mg/dL — ABNORMAL HIGH (ref 70–99)

## 2019-05-08 LAB — COMPREHENSIVE METABOLIC PANEL
ALT: 22 U/L (ref 0–44)
AST: 19 U/L (ref 15–41)
Albumin: 4.7 g/dL (ref 3.5–5.0)
Alkaline Phosphatase: 101 U/L (ref 38–126)
Anion gap: 29 — ABNORMAL HIGH (ref 5–15)
BUN: 26 mg/dL — ABNORMAL HIGH (ref 6–20)
CO2: 11 mmol/L — ABNORMAL LOW (ref 22–32)
Calcium: 9.3 mg/dL (ref 8.9–10.3)
Chloride: 92 mmol/L — ABNORMAL LOW (ref 98–111)
Creatinine, Ser: 1.3 mg/dL — ABNORMAL HIGH (ref 0.44–1.00)
GFR calc Af Amer: >60 mL/min (ref >60)
GFR calc non Af Amer: 53 mL/min — ABNORMAL LOW (ref >60)
Glucose, Bld: 428 mg/dL — ABNORMAL HIGH (ref 70–99)
Potassium: 5.3 mmol/L — ABNORMAL HIGH (ref 3.5–5.1)
Sodium: 132 mmol/L — ABNORMAL LOW (ref 135–145)
Total Bilirubin: 1.7 mg/dL — ABNORMAL HIGH (ref 0.3–1.2)
Total Protein: 7.5 g/dL (ref 6.5–8.1)

## 2019-05-08 LAB — CBC
HCT: 46.2 % — ABNORMAL HIGH (ref 36.0–46.0)
Hemoglobin: 15.1 g/dL — ABNORMAL HIGH (ref 12.0–15.0)
MCH: 30.4 pg (ref 26.0–34.0)
MCHC: 32.7 g/dL (ref 30.0–36.0)
MCV: 93 fL (ref 80.0–100.0)
Platelets: 264 10*3/uL (ref 150–400)
RBC: 4.97 MIL/uL (ref 3.87–5.11)
RDW: 13.4 % (ref 11.5–15.5)
WBC: 14.7 10*3/uL — ABNORMAL HIGH (ref 4.0–10.5)
nRBC: 0 % (ref 0.0–0.2)

## 2019-05-08 LAB — BASIC METABOLIC PANEL
Anion gap: 20 — ABNORMAL HIGH (ref 5–15)
BUN: 21 mg/dL — ABNORMAL HIGH (ref 6–20)
CO2: 18 mmol/L — ABNORMAL LOW (ref 22–32)
Calcium: 8.6 mg/dL — ABNORMAL LOW (ref 8.9–10.3)
Chloride: 95 mmol/L — ABNORMAL LOW (ref 98–111)
Creatinine, Ser: 0.88 mg/dL (ref 0.44–1.00)
GFR calc Af Amer: >60 mL/min (ref >60)
GFR calc non Af Amer: >60 mL/min (ref >60)
Glucose, Bld: 243 mg/dL — ABNORMAL HIGH (ref 70–99)
Potassium: 4.4 mmol/L (ref 3.5–5.1)
Sodium: 133 mmol/L — ABNORMAL LOW (ref 135–145)

## 2019-05-08 LAB — BLOOD GAS, VENOUS
Acid-base deficit: 14.4 mmol/L — ABNORMAL HIGH (ref 0.0–2.0)
Bicarbonate: 11.9 mmol/L — ABNORMAL LOW (ref 20.0–28.0)
O2 Saturation: 64.1 %
Patient temperature: 37
pCO2, Ven: 29 mmHg — ABNORMAL LOW (ref 44.0–60.0)
pH, Ven: 7.22 — ABNORMAL LOW (ref 7.250–7.430)
pO2, Ven: 41 mmHg (ref 32.0–45.0)

## 2019-05-08 LAB — MRSA PCR SCREENING: MRSA by PCR: NEGATIVE

## 2019-05-08 LAB — LIPASE, BLOOD: Lipase: 25 U/L (ref 11–51)

## 2019-05-08 MED ORDER — DEXTROSE-NACL 5-0.45 % IV SOLN
INTRAVENOUS | Status: DC
  Administered 2019-05-08: 21:00:00 via INTRAVENOUS

## 2019-05-08 MED ORDER — SODIUM CHLORIDE 0.9 % IV SOLN
INTRAVENOUS | Status: DC
  Administered 2019-05-08: 21:00:00 via INTRAVENOUS

## 2019-05-08 MED ORDER — INSULIN REGULAR(HUMAN) IN NACL 100-0.9 UT/100ML-% IV SOLN
INTRAVENOUS | Status: DC
  Filled 2019-05-08: qty 100

## 2019-05-08 MED ORDER — SODIUM CHLORIDE 0.9 % IV SOLN
INTRAVENOUS | Status: AC
  Administered 2019-05-08: 21:00:00 via INTRAVENOUS

## 2019-05-08 MED ORDER — SODIUM CHLORIDE 0.9 % IV BOLUS
1000.0000 mL | Freq: Once | INTRAVENOUS | Status: AC
  Administered 2019-05-08: 18:00:00 1000 mL via INTRAVENOUS

## 2019-05-08 MED ORDER — ENOXAPARIN SODIUM 40 MG/0.4ML ~~LOC~~ SOLN
40.0000 mg | SUBCUTANEOUS | Status: DC
  Administered 2019-05-08 – 2019-05-09 (×2): 40 mg via SUBCUTANEOUS
  Filled 2019-05-08 (×2): qty 0.4

## 2019-05-08 MED ORDER — MORPHINE SULFATE (PF) 4 MG/ML IV SOLN
4.0000 mg | Freq: Once | INTRAVENOUS | Status: AC
  Administered 2019-05-08: 4 mg via INTRAVENOUS
  Filled 2019-05-08: qty 1

## 2019-05-08 MED ORDER — DEXTROSE-NACL 5-0.45 % IV SOLN: INTRAVENOUS | Status: DC

## 2019-05-08 MED ORDER — TRAMADOL HCL 50 MG PO TABS
50.0000 mg | ORAL_TABLET | Freq: Once | ORAL | Status: AC
  Administered 2019-05-08: 50 mg via ORAL
  Filled 2019-05-08: qty 1

## 2019-05-08 MED ORDER — ONDANSETRON HCL 4 MG/2ML IJ SOLN
4.0000 mg | Freq: Once | INTRAMUSCULAR | Status: AC
  Administered 2019-05-08: 4 mg via INTRAVENOUS
  Filled 2019-05-08: qty 2

## 2019-05-08 MED ORDER — INSULIN REGULAR(HUMAN) IN NACL 100-0.9 UT/100ML-% IV SOLN
INTRAVENOUS | Status: DC
  Administered 2019-05-08: 3.7 [IU]/h via INTRAVENOUS

## 2019-05-08 MED ORDER — SODIUM CHLORIDE 0.9 % IV BOLUS: 1000.0000 mL | Freq: Once | INTRAVENOUS | Status: DC

## 2019-05-08 NOTE — ED Notes (Signed)
ED TO INPATIENT HANDOFF REPORT  ED Nurse Name and Phone #:  Alvis LemmingsDawn [phone]  S Name/Age/Gender Kristine Allison 38 y.o. female Room/Bed: ED02A/ED02A  Code Status   Code Status: Full Code  Home/SNF/Other Home Patient oriented to: self Is this baseline? oriented x4  Triage Complete: Triage complete  Chief Complaint high blood sugar  Triage Note Pt to ED via EMS from home c/o hyperglycemia, patient just discharged from hospital yesterday for same.  States CBG at home in the 300s, 467 CBG for EMS on scene and given 100cc NS bolus, 120 HR, 98/60 BP, 99% RA.  Pt states headache and back pain that is usual with high sugars.  Presents A&Ox4, chest rise even and unlabored, in NAD at this time.   Allergies Allergies  Allergen Reactions  . Latex Rash    Level of Care/Admitting Diagnosis ED Disposition    ED Disposition Condition Comment   Admit  Hospital Area: [facility] [[id number]]  Level of Care: Stepdown [14]  Covid Evaluation: Asymptomatic Screening Protocol (No Symptoms)  Diagnosis: DKA, type 1 [facility](HCC) [[id number]][[id number]]  Admitting Physician: Pearletha AlfredSEALS, ANGELA H [[id number]]  Attending Physician: Pearletha AlfredSEALS, ANGELA H [[id number]]  Estimated length of stay: past midnight tomorrow  Certification:: I certify this patient will need inpatient services for at least 2 midnights  PT Class (Do Not Modify): Inpatient [101]  PT Acc Code (Do Not Modify): Private [1]       B Medical/Surgery History Past Medical History:  Diagnosis Date  . Anxiety   . Asthma   . Diabetes mellitus without complication (HCC)   . GEXBMWUX(324.4Headache(784.0)    Past Surgical History:  Procedure Laterality Date  . NO PAST SURGERIES       A IV Location/Drains/Wounds Patient Lines/Drains/Airways Status   Active Line/Drains/Airways    Name:   Placement date:   Placement time:   Site:   Days:   Peripheral IV 05/08/19 Right Hand   05/08/19    1738    Hand   less than 1          Intake/Output Last 24  hours No intake or output data in the 24 hours ending 05/08/19 1946  Labs/Imaging Results for orders placed or performed during the hospital encounter of 05/08/19 (from the past 48 hour(s))  Glucose, capillary     Status: Abnormal   Collection Time: 05/08/19  5:27 PM  Result Value Ref Range   Glucose-Capillary 457 (H) 70 - 99 mg/dL  CBC     Status: Abnormal   Collection Time: 05/08/19  5:44 PM  Result Value Ref Range   WBC 14.7 (H) 4.0 - 10.5 K/uL   RBC 4.97 3.87 - 5.11 MIL/uL   Hemoglobin 15.1 (H) 12.0 - 15.0 g/dL   HCT 01.046.2 (H) 27.236.0 - 53.646.0 %   MCV 93.0 80.0 - 100.0 fL   MCH 30.4 26.0 - 34.0 pg   MCHC 32.7 30.0 - 36.0 g/dL   RDW 64.413.4 03.411.5 - 74.215.5 %   Platelets 264 150 - 400 K/uL   nRBC 0.0 0.0 - 0.2 %    Comment: Performed at [facility], [address] [place], [place] [id number]  Comprehensive metabolic panel     Status: Abnormal   Collection Time: 05/08/19  5:44 PM  Result Value Ref Range   Sodium 132 (L) 135 - 145 mmol/L    Comment: LYTES REPEATED JJB   Potassium 5.3 (H) 3.5 - 5.1 mmol/L    Comment: HEMOLYSIS AT THIS  LEVEL MAY AFFECT RESULT   Chloride 92 (L) 98 - 111 mmol/L   CO2 11 (L) 22 - 32 mmol/L   Glucose, Bld 428 (H) 70 - 99 mg/dL   BUN 26 (H) 6 - 20 mg/dL   Creatinine, Ser 1.30 (H) 0.44 - 1.00 mg/dL   Calcium 9.3 8.9 - 10.3 mg/dL   Total Protein 7.5 6.5 - 8.1 g/dL   Albumin 4.7 3.5 - 5.0 g/dL   AST 19 15 - 41 U/L   ALT 22 0 - 44 U/L   Alkaline Phosphatase 101 38 - 126 U/L   Total Bilirubin 1.7 (H) 0.3 - 1.2 mg/dL   GFR calc non Af Amer 53 (L) >60 mL/min   GFR calc Af Amer >60 >60 mL/min   Anion gap 29 (H) 5 - 15    Comment: Performed at [facility], [address] [place], [place] [address]  Lipase, blood     Status: None   Collection Time: 05/08/19  5:44 PM  Result Value Ref Range   Lipase 25 11 - 51 U/L    Comment: Performed at [facility], [address] [place], [place] [address]  Blood gas, venous     Status:  Abnormal   Collection Time: 05/08/19  5:44 PM  Result Value Ref Range   pH, Ven 7.22 (L) 7.250 - 7.430   pCO2, Ven 29 (L) 44.0 - 60.0 mmHg   pO2, Ven 41.0 32.0 - 45.0 mmHg   Bicarbonate 11.9 (L) 20.0 - 28.0 mmol/L   Acid-base deficit 14.4 (H) 0.0 - 2.0 mmol/L   O2 Saturation 64.1 %   Patient temperature 37.0    Sample type VENOUS     Comment: Performed at [facility], [address], [place], [place] [address]   No results found.  Pending Labs Unresulted Labs (From admission, onward)    Start     Ordered   05/15/19 0500  Creatinine, serum  (enoxaparin (LOVENOX)    CrCl >/= 30 ml/min)  Weekly,   STAT    Comments: while on enoxaparin therapy    05/08/19 1920   05/08/19 3419  Basic metabolic panel  STAT Now then every 4 hours ,   STAT     05/08/19 1920   05/08/19 1921  CBC  (enoxaparin (LOVENOX)    CrCl >/= 30 ml/min)  Once,   STAT    Comments: Baseline for enoxaparin therapy IF NOT ALREADY DRAWN.  Notify MD if PLT < 100 K.    05/08/19 1920   05/08/19 1921  Urinalysis, Routine w reflex microscopic  Once,   STAT     05/08/19 1920          Vitals/Pain Today's Vitals   05/08/19 1703 05/08/19 1717  BP:  114/69  Pulse:  (!) 114  Resp: 18 12  Temp: 98.4 F (36.9 C)   TempSrc: Oral   SpO2:  100%  Weight: 52.2 kg   Height: 5\' 4"  (1.626 m)   PainSc: 9      Isolation Precautions No active isolations  Medications Medications  sodium chloride 0.9 % bolus 1,000 mL (has no administration in time range)  dextrose 5 %-0.45 % sodium chloride infusion (has no administration in time range)  0.9 %  sodium chloride infusion (has no administration in time range)  0.9 %  sodium chloride infusion (has no administration in time range)  dextrose 5 %-0.45 % sodium chloride infusion (has no administration in time range)  insulin  regular, human (MYXREDLIN) 100 units/ 100 mL infusion (3.7 Units/hr Intravenous New Bag/Given 05/08/19 1942)  enoxaparin (LOVENOX) injection 40 mg  (has no administration in time range)  sodium chloride 0.9 % bolus 1,000 mL (1,000 mLs Intravenous New Bag/Given 05/08/19 1738)  ondansetron (ZOFRAN) injection 4 mg (4 mg Intravenous Given 05/08/19 1750)  ondansetron (ZOFRAN) injection 4 mg (4 mg Intravenous Given 05/08/19 1929)  morphine 4 MG/ML injection 4 mg (4 mg Intravenous Given 05/08/19 1930)    Mobility walks Low fall risk   Focused Assessments Pulmonary Assessment Handoff:  Lung sounds:   O2 Device: Room Air        R Recommendations: See Admitting Provider Note  Report given to:   Additional Notes:

## 2019-05-08 NOTE — H&P (Signed)
[place] [facility] [place] NAME: Kristine Allison    MR#:  [id number]  DATE OF BIRTH:  1981-05-18  DATE OF ADMISSION:  05/08/2019  PRIMARY CARE PHYSICIAN: Patient, No Pcp Per   REQUESTING/REFERRING PHYSICIAN: Delman Kitten, MD  CHIEF COMPLAINT:   Chief Complaint  Patient presents with  . Hyperglycemia    HISTORY OF PRESENT ILLNESS:  Kristine Allison  is a 38 y.o. female with a known history of type 1 diabetes mellitus with frequent DKA requiring admission to the hospital.  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  However on return to the hospital today she reports she has not been able to eat anything since she left the hospital approximately 18 hours ago.  She woke this morning with glucose of 600 after taking her insulin last night prior to going to bed.  She reports feeling dehydrated with increased weakness and fatigue with no real improvement since she was hospitalized previously.  She is experiencing nausea and vomiting however denies significant abdominal pain.  She denies diarrhea.  She denies chest pain, fever, chills, shortness of breath.  She denies hematemesis, hematochezia, or melena.  Venous blood gas demonstrates pH of 7.22 with bicarbonate 11.9 on arrival.  Glucose is 428 on her arrival after reportedly taking 30 units regular insulin prior to her arrival.  Anion gap is 29.  BUN is 26 with creatinine 1.30.  WBC is elevated 14.7 which may be physiologic based on her history of nausea and vomiting.  She has been admitted by the hospitalist service with transfer of care to the intensivist for ICU stepdown management.  PAST MEDICAL HISTORY:   Past Medical History:  Diagnosis Date  . Anxiety   . Asthma   . Diabetes mellitus without complication ([place])   . Headache(784.0)     PAST SURGICAL HISTORY:   Past Surgical History:  Procedure Laterality Date  . NO PAST SURGERIES      SOCIAL  HISTORY:   Social History   Tobacco Use  . Smoking status: Current Every Day Smoker    Packs/day: 0.25    Years: 20.00    Pack years: 5.00    Types: Cigarettes  . Smokeless tobacco: Never Used  Substance Use Topics  . Alcohol use: No    Alcohol/week: 0.0 standard drinks    FAMILY HISTORY:   Family History  Problem Relation Age of Onset  . Heart disease Other        No family history    DRUG ALLERGIES:   Allergies  Allergen Reactions  . Latex Rash    REVIEW OF SYSTEMS:   Review of Systems  Constitutional: Positive for malaise/fatigue. Negative for chills and fever.  HENT: Negative for congestion, sinus pain and sore throat.   Eyes: Negative for blurred vision and double vision.  Respiratory: Positive for sputum production. Negative for shortness of breath and wheezing.   Cardiovascular: Negative for chest pain and palpitations.  Gastrointestinal: Positive for nausea and vomiting. Negative for abdominal pain, constipation, diarrhea and heartburn.  Genitourinary: Negative for dysuria, flank pain, frequency and hematuria.  Musculoskeletal: Negative for falls and myalgias.  Skin: Negative for itching and rash.  Neurological: Negative for dizziness, weakness and headaches.  Psychiatric/Behavioral: Negative for depression.    MEDICATIONS AT HOME:   Prior to Admission medications   Medication Sig Start Date End Date Taking? Authorizing Provider  insulin NPH-regular Human (NOVOLIN 70/30) (70-30) 100 UNIT/ML  injection 20 units subcu twice daily Patient taking differently: Inject 20 Units into the skin 3 (three) times daily.  03/26/18  Yes Katha HammingKonidena, Snehalatha, MD  ondansetron (ZOFRAN) 4 MG tablet Take 1 tablet (4 mg total) by mouth every 6 (six) hours as needed for nausea. 01/18/19  Yes Altamese DillingVachhani, Vaibhavkumar, MD      VITAL SIGNS:  Blood pressure 114/69, pulse (!) 114, temperature 98.4 F (36.9 C), temperature source Oral, resp. rate 12, height 5\' 4"  (1.626 m), weight  52.2 kg, last menstrual period 10/23/2013, SpO2 100 %.  PHYSICAL EXAMINATION:  Physical Exam  GENERAL:  38 y.o.-year-old patient lying in the bed with no acute distress.  EYES: Pupils equal, round, reactive to light and accommodation. No scleral icterus. Extraocular muscles intact.  HEENT: Head atraumatic, normocephalic. Oropharynx and nasopharynx clear.  NECK:  Supple, no jugular venous distention. No thyroid enlargement, no tenderness.  LUNGS: Normal breath sounds bilaterally, no wheezing, rales,rhonchi or crepitation. No use of accessory muscles of respiration.  CARDIOVASCULAR: Tachycardia with regular rhythm, S1, S2 normal. No murmurs, rubs, or gallops.  ABDOMEN: Soft, nondistended, nontender. Bowel sounds present. No organomegaly or mass.  EXTREMITIES: No pedal edema, cyanosis, or clubbing.  NEUROLOGIC: Cranial nerves II through XII are intact. Muscle strength 5/5 in all extremities. Sensation intact. Gait not checked.  PSYCHIATRIC: The patient is alert and oriented x 3.  Normal affect and good eye contact. SKIN: No obvious rash, lesion, or ulcer. Alopecia  LABORATORY PANEL:   CBC Recent Labs  Lab 05/08/19 1744  WBC 14.7*  HGB 15.1*  HCT 46.2*  PLT 264   ------------------------------------------------------------------------------------------------------------------  Chemistries  Recent Labs  Lab 05/08/19 1744  NA 132*  K 5.3*  CL 92*  CO2 11*  GLUCOSE 428*  BUN 26*  CREATININE 1.30*  CALCIUM 9.3  AST 19  ALT 22  ALKPHOS 101  BILITOT 1.7*   ------------------------------------------------------------------------------------------------------------------  Cardiac Enzymes No results for input(s): TROPONINI in the last 168 hours. ------------------------------------------------------------------------------------------------------------------  RADIOLOGY:  No results found.    IMPRESSION AND PLAN:   1.  DKA - Insulin drip per DKA protocol - Normal  saline infusing at 100 cc/h to peripheral IV - BMP per protocol every 4 hours - We will manage electrolyte imbalance per DKA protocol -We will treat nausea vomiting with IV antiemetic -We will treat pain with analgesic - Diabetic educator consultation  2.  Asthma - PRN albuterol  3.  Tobacco abuse - Tobacco cessation counseling provided  4.  Generalized weakness - Secondary to #1 -We will consult physical therapy for supportive care  DVT and PPI prophylaxis    All the records are reviewed and case discussed with ED provider. The plan of care was discussed in details with the patient (and family). I answered all questions. The patient agreed to proceed with the above mentioned plan. Further management will depend upon hospital course.   CODE STATUS: Full code  TOTAL TIME TAKING CARE OF THIS PATIENT: 45 minutes.    Milas Kocherngela H Brentin Shin CRNP on 05/08/2019 at 6:30 PM  Pager - [phone]  After 6pm go to [email or web address] - Social research officer, governmentpassword [email or web address] [email or web address]  [facility]  Office  [phone]  CC: Primary care physician; Patient, No Pcp Per   Note: This dictation was prepared with Dragon dictation along with smaller phrase technology. Any transcriptional errors that result from this process are unintentional.

## 2019-05-08 NOTE — Progress Notes (Signed)
eLink Physician-Brief Progress Note Patient Name: Kristine Allison DOB: [birth year] MRN: [id number]   Date of Service  05/08/2019  HPI/Events of Note  Notified of ICU admission. Has already been seen by bedside ICU team. 38 year old woman with type I DM, recent DKA, discharged home and just wasn't feeling well, did not take her insulin. Felt she was dehydrated, with high sugar levels and came into the ER where she was noted to have hyperglycemia and metabolic acidosis once again. Given IV fluids, started on IV insulin and admitted to ICU. In the ICU she denies any complaints and says she is much better. No belly pain, no chest pain, no shortness of breath, no dysuria.   eICU Interventions  Agree with NPO, DKA protocol with IV insulin BMP every 4 hours Adjust and titrate fluids based on potassium, glucose and bicarb levels Will need to switch to long acting form once stable DVT prophylaxis Please call us if needed - need to monitor electrolytes closely while on insulin     Intervention Category Major Interventions: Electrolyte abnormality - evaluation and management;Hyperglycemia - active titration of insulin therapy  Margaretmary Lombard 05/08/2019, 8:59 PM

## 2019-05-08 NOTE — ED Provider Notes (Signed)
[facility] Emergency Department Provider Note   ____________________________________________   First MD Initiated Contact with Patient 05/08/19 1700     (approximate)  I have reviewed the triage vital signs and the nursing notes.   HISTORY  Chief Complaint Hyperglycemia    HPI Colin InaMargie E Jearld AdjutantSimmons Cohn is a 38 y.o. female here for evaluation of high blood sugar and inability to eat  Patient reports she has not been able to eat anything since she left the hospital.  She reports she was not hardly eating anything at all when she left, but now reports she is not been able eat anything even a cracker over the last 18 hours  She reports she used her insulin last night, she got up this morning her blood sugar was greater than 600.  Then about 30 minutes before EMS picked her up she used additional 30 units of her insulin  Reports she feels dehydrated, fatigued and weak.  Reports she does not really feel any better really than when she left the hospital.  Denies pregnancy.  No vomiting.  She had abdominal pain when she entered the hospital that is gone away but she just has 0 appetite and feels nauseated using Zofran at home.   Past Medical History:  Diagnosis Date  . Anxiety   . Asthma   . Diabetes mellitus without complication (HCC)   . NGEXBMWU(132.4Headache(784.0)     Patient Active Problem List   Diagnosis Date Noted  . Anxiety 08/07/2018  . DKA (diabetic ketoacidoses) (HCC) 08/07/2018  . Edema of the tongue 03/26/2018  . Dehydration   . Diabetic ketoacidosis without coma associated with type 1 diabetes mellitus (HCC)   . Yeast infection 02/09/2015  . Acute renal failure syndrome (HCC)   . Tachycardia 11/03/2014  . Insomnia 09/08/2014  . Dizziness 09/08/2014  . Migraine, unspecified 12/14/2013  . Elevated troponin I level 11/19/2013  . Acute myocardial infarction, subendocardial infarction, initial episode of care (HCC) 11/18/2013  . Noncompliance with  medication regimen 11/15/2013  . Leg pain 09/08/2013  . Loss of weight 09/08/2013  . Depression 09/08/2013  . Hypotension, unspecified 09/08/2013  . Tobacco use disorder 08/01/2013  . Diabetes mellitus type 1, uncontrolled (HCC) 06/24/2012  . GERD (gastroesophageal reflux disease) 06/17/2012    Past Surgical History:  Procedure Laterality Date  . NO PAST SURGERIES      Prior to Admission medications   Medication Sig Start Date End Date Taking? Authorizing Provider  insulin NPH-regular Human (NOVOLIN 70/30) (70-30) 100 UNIT/ML injection 20 units subcu twice daily Patient taking differently: Inject 20 Units into the skin 3 (three) times daily.  03/26/18  Yes Katha HammingKonidena, Snehalatha, MD  ondansetron (ZOFRAN) 4 MG tablet Take 1 tablet (4 mg total) by mouth every 6 (six) hours as needed for nausea. 01/18/19  Yes Altamese DillingVachhani, Vaibhavkumar, MD    Allergies Latex  Family History  Problem Relation Age of Onset  . Heart disease Other        No family history    Social History Social History   Tobacco Use  . Smoking status: Current Every Day Smoker    Packs/day: 0.25    Years: 20.00    Pack years: 5.00    Types: Cigarettes  . Smokeless tobacco: Never Used  Substance Use Topics  . Alcohol use: No    Alcohol/week: 0.0 standard drinks  . Drug use: No    Review of Systems Constitutional: No fever/chills Eyes: No visual changes. ENT: No sore  throat.  Feels dehydrated Cardiovascular: Denies chest pain. Respiratory: Denies shortness of breath. Gastrointestinal: No abdominal pain.  Nausea Genitourinary: Negative for dysuria. Musculoskeletal: Negative for back pain. Skin: Negative for rash. Neurological: Negative for headaches, areas of focal weakness or numbness.    ____________________________________________   PHYSICAL EXAM:  VITAL SIGNS: ED Triage Vitals [05/08/19 1703]  Enc Vitals Group     BP      Pulse      Resp 18     Temp 98.4 F (36.9 C)     Temp Source Oral      SpO2      Weight 115 lb (52.2 kg)     Height 5\' 4"  (1.626 m)     Head Circumference      Peak Flow      Pain Score 9     Pain Loc      Pain Edu?      Excl. in GC?     Constitutional: Alert and oriented.  Mildly ill on top of chronically ill appearance. Eyes: Conjunctivae are normal. Head: Atraumatic. Nose: No congestion/rhinnorhea. Mouth/Throat: Mucous membranes are dry Neck: No stridor.  Cardiovascular: Mildly tachycardic rate, regular rhythm. Grossly normal heart sounds.  Good peripheral circulation. Respiratory: Normal respiratory effort.  No retractions. Lungs CTAB. Gastrointestinal: Soft and nontender. No distention. Musculoskeletal: No lower extremity tenderness nor edema. Neurologic:  Normal speech and language. No gross focal neurologic deficits are appreciated.  Skin:  Skin is warm, dry and intact. No rash noted. Psychiatric: Mood and affect are normal. Speech and behavior are normal.  ____________________________________________   LABS (all labs ordered are listed, but only abnormal results are displayed)  Labs Reviewed  CBC - Abnormal; Notable for the following components:      Result Value   WBC 14.7 (*)    Hemoglobin 15.1 (*)    HCT 46.2 (*)    All other components within normal limits  COMPREHENSIVE METABOLIC PANEL - Abnormal; Notable for the following components:   Sodium 132 (*)    Potassium 5.3 (*)    Chloride 92 (*)    CO2 11 (*)    Glucose, Bld 428 (*)    BUN 26 (*)    Creatinine, Ser 1.30 (*)    Total Bilirubin 1.7 (*)    GFR calc non Af Amer 53 (*)    Anion gap 29 (*)    All other components within normal limits  BLOOD GAS, VENOUS - Abnormal; Notable for the following components:   pH, Ven 7.22 (*)    pCO2, Ven 29 (*)    Bicarbonate 11.9 (*)    Acid-base deficit 14.4 (*)    All other components within normal limits  LIPASE, BLOOD  CBG MONITORING, ED  POC URINE PREG, ED  CBG MONITORING, ED  CBG MONITORING, ED  CBG MONITORING, ED  CBG  MONITORING, ED  CBG MONITORING, ED  CBG MONITORING, ED  CBG MONITORING, ED  CBG MONITORING, ED  CBG MONITORING, ED  CBG MONITORING, ED  CBG MONITORING, ED  CBG MONITORING, ED  CBG MONITORING, ED   ____________________________________________  EKG   ____________________________________________  RADIOLOGY   ____________________________________________   PROCEDURES  Procedure(s) performed: None  Procedures  Critical Care performed: Yes, see critical care note(s)  CRITICAL CARE Performed by: Sharyn CreamerMark    Total critical care time: 25 minutes  Critical care time was exclusive of separately billable procedures and treating other patients.  Critical care was necessary to treat or prevent imminent or  life-threatening deterioration.  Critical care was time spent personally by me on the following activities: development of treatment plan with patient and/or surrogate as well as nursing, discussions with consultants, evaluation of patient's response to treatment, examination of patient, obtaining history from patient or surrogate, ordering and performing treatments and interventions, ordering and review of laboratory studies, ordering and review of radiographic studies, pulse oximetry and re-evaluation of patient's condition.  ____________________________________________   INITIAL IMPRESSION / ASSESSMENT AND PLAN / ED COURSE  Pertinent labs & imaging results that were available during my care of the patient were reviewed by me and considered in my medical decision making (see chart for details).   Patient presents with lab work and clinical history consistent with diabetic ketoacidosis.  Will readmit to the hospital service, receiving IV fluid bolus here, potassium appropriate, will initiate IV insulin drip.  Discussed case with Gardiner Barefoot nurse practitioner, will admit to the hospital for further work-up.  Patient does report starting to develop some recurrence of her  abdominal pain as well as nausea and vomited once, will give additional antiemetic and pain relief.  Appears consistent with DKA.  Further work-up and care to hospitalist service.  Rina Adney was evaluated in Emergency Department on 05/08/2019 for the symptoms described in the history of present illness. She was evaluated in the context of the global COVID-19 pandemic, which necessitated consideration that the patient might be at risk for infection with the SARS-CoV-2 virus that causes COVID-19. Institutional protocols and algorithms that pertain to the evaluation of patients at risk for COVID-19 are in a state of rapid change based on information released by regulatory bodies including the CDC and federal and state organizations. These policies and algorithms were followed during the patient's care in the ED.  No COVID-like symptoms, test negative less than 3 days ago      ____________________________________________   FINAL CLINICAL IMPRESSION(S) / ED DIAGNOSES  Final diagnoses:  Diabetic ketoacidosis without coma associated with type 1 diabetes mellitus ([place])        Note:  This document was prepared using Dragon voice recognition software and may include unintentional dictation errors       Delman Kitten, MD 05/08/19 1840

## 2019-05-08 NOTE — ED Triage Notes (Signed)
Pt to ED via EMS from home c/o hyperglycemia, patient just discharged from hospital yesterday for same.  States CBG at home in the 300s, 467 CBG for EMS on scene and given 100cc NS bolus, 120 HR, 98/60 BP, 99% RA.  Pt states headache and back pain that is usual with high sugars.  Presents A&Ox4, chest rise even and unlabored, in NAD at this time.

## 2019-05-09 ENCOUNTER — Telehealth: Payer: Self-pay

## 2019-05-09 DIAGNOSIS — E440 Moderate protein-calorie malnutrition: Secondary | ICD-10-CM | POA: Insufficient documentation

## 2019-05-09 LAB — GLUCOSE, CAPILLARY
Glucose-Capillary: 106 mg/dL — ABNORMAL HIGH (ref 70–99)
Glucose-Capillary: 119 mg/dL — ABNORMAL HIGH (ref 70–99)
Glucose-Capillary: 129 mg/dL — ABNORMAL HIGH (ref 70–99)
Glucose-Capillary: 142 mg/dL — ABNORMAL HIGH (ref 70–99)
Glucose-Capillary: 164 mg/dL — ABNORMAL HIGH (ref 70–99)
Glucose-Capillary: 207 mg/dL — ABNORMAL HIGH (ref 70–99)
Glucose-Capillary: 220 mg/dL — ABNORMAL HIGH (ref 70–99)
Glucose-Capillary: 80 mg/dL (ref 70–99)
Glucose-Capillary: 84 mg/dL (ref 70–99)
Glucose-Capillary: 95 mg/dL (ref 70–99)

## 2019-05-09 LAB — BASIC METABOLIC PANEL
Anion gap: 13 (ref 5–15)
Anion gap: 5 (ref 5–15)
BUN: 19 mg/dL (ref 6–20)
BUN: 17 mg/dL (ref 6–20)
CO2: 23 mmol/L (ref 22–32)
CO2: 22 mmol/L (ref 22–32)
Calcium: 8.4 mg/dL — ABNORMAL LOW (ref 8.9–10.3)
Calcium: 8 mg/dL — ABNORMAL LOW (ref 8.9–10.3)
Chloride: 95 mmol/L — ABNORMAL LOW (ref 98–111)
Chloride: 104 mmol/L (ref 98–111)
Creatinine, Ser: 0.64 mg/dL (ref 0.44–1.00)
Creatinine, Ser: 0.69 mg/dL (ref 0.44–1.00)
GFR calc Af Amer: >60 mL/min (ref >60)
GFR calc Af Amer: >60 mL/min (ref >60)
GFR calc non Af Amer: >60 mL/min (ref >60)
GFR calc non Af Amer: >60 mL/min (ref >60)
Glucose, Bld: 127 mg/dL — ABNORMAL HIGH (ref 70–99)
Glucose, Bld: 80 mg/dL (ref 70–99)
Potassium: 3.7 mmol/L (ref 3.5–5.1)
Potassium: 3.5 mmol/L (ref 3.5–5.1)
Sodium: 131 mmol/L — ABNORMAL LOW (ref 135–145)
Sodium: 131 mmol/L — ABNORMAL LOW (ref 135–145)

## 2019-05-09 LAB — CBC
HCT: 35.7 % — ABNORMAL LOW (ref 36.0–46.0)
Hemoglobin: 12 g/dL (ref 12.0–15.0)
MCH: 29.9 pg (ref 26.0–34.0)
MCHC: 33.6 g/dL (ref 30.0–36.0)
MCV: 89 fL (ref 80.0–100.0)
Platelets: 193 10*3/uL (ref 150–400)
RBC: 4.01 MIL/uL (ref 3.87–5.11)
RDW: 13.4 % (ref 11.5–15.5)
WBC: 10.8 10*3/uL — ABNORMAL HIGH (ref 4.0–10.5)
nRBC: 0 % (ref 0.0–0.2)

## 2019-05-09 MED ORDER — SODIUM CHLORIDE 0.9 % IV SOLN
INTRAVENOUS | Status: DC
  Administered 2019-05-09: 06:00:00 via INTRAVENOUS

## 2019-05-09 MED ORDER — ONDANSETRON HCL 4 MG/2ML IJ SOLN
4.0000 mg | Freq: Once | INTRAMUSCULAR | Status: AC
  Administered 2019-05-09: 09:00:00 4 mg via INTRAVENOUS
  Filled 2019-05-09: qty 2

## 2019-05-09 MED ORDER — INSULIN GLARGINE 100 UNIT/ML ~~LOC~~ SOLN
12.0000 [IU] | Freq: Every evening | SUBCUTANEOUS | Status: DC
  Administered 2019-05-09: 17:00:00 12 [IU] via SUBCUTANEOUS
  Filled 2019-05-09 (×2): qty 0.12

## 2019-05-09 MED ORDER — CHLORHEXIDINE GLUCONATE CLOTH 2 % EX PADS: 6.0000 | MEDICATED_PAD | Freq: Every day | CUTANEOUS | Status: DC

## 2019-05-09 MED ORDER — INSULIN GLARGINE 100 UNIT/ML ~~LOC~~ SOLN
10.0000 [IU] | Freq: Every day | SUBCUTANEOUS | Status: DC
  Filled 2019-05-09: qty 0.1

## 2019-05-09 MED ORDER — ENSURE MAX PROTEIN PO LIQD
11.0000 [oz_av] | Freq: Two times a day (BID) | ORAL | Status: DC
  Filled 2019-05-09: qty 330

## 2019-05-09 MED ORDER — POTASSIUM CHLORIDE IN NACL 20-0.9 MEQ/L-% IV SOLN
INTRAVENOUS | Status: DC
  Administered 2019-05-09: 16:00:00 via INTRAVENOUS
  Filled 2019-05-09 (×5): qty 1000

## 2019-05-09 MED ORDER — ACETAMINOPHEN 325 MG PO TABS
650.0000 mg | ORAL_TABLET | ORAL | Status: DC | PRN
  Administered 2019-05-09: 650 mg via ORAL
  Filled 2019-05-09: qty 2

## 2019-05-09 MED ORDER — INSULIN ASPART 100 UNIT/ML ~~LOC~~ SOLN: 0.0000 [IU] | SUBCUTANEOUS | Status: DC

## 2019-05-09 MED ORDER — ONDANSETRON HCL 4 MG/2ML IJ SOLN
4.0000 mg | Freq: Four times a day (QID) | INTRAMUSCULAR | Status: DC | PRN
  Administered 2019-05-09: 4 mg via INTRAVENOUS

## 2019-05-09 MED ORDER — INSULIN ASPART 100 UNIT/ML ~~LOC~~ SOLN: 0.0000 [IU] | Freq: Every day | SUBCUTANEOUS | Status: DC

## 2019-05-09 MED ORDER — ONDANSETRON HCL 4 MG/2ML IJ SOLN
INTRAMUSCULAR | Status: AC
  Filled 2019-05-09: qty 2

## 2019-05-09 MED ORDER — INSULIN ASPART 100 UNIT/ML ~~LOC~~ SOLN
0.0000 [IU] | Freq: Three times a day (TID) | SUBCUTANEOUS | Status: DC
  Administered 2019-05-09: 17:00:00 5 [IU] via SUBCUTANEOUS
  Administered 2019-05-10: 3 [IU] via SUBCUTANEOUS
  Administered 2019-05-10: 12:00:00 5 [IU] via SUBCUTANEOUS
  Filled 2019-05-09 (×3): qty 1

## 2019-05-09 NOTE — Progress Notes (Signed)
Patient ID: Kristine Allison   Patient is currently admitted to [facility] on 05/08/2019.  The patient will not be able to make her court date tomorrow.  Dr Loletha Grayer [phone]

## 2019-05-09 NOTE — Telephone Encounter (Signed)
Left message for pt

## 2019-05-09 NOTE — Telephone Encounter (Signed)
-----   Message from Wilhelmina Mcardle, MD sent at 05/09/2019  8:45 AM EDT ----- Please schedule 30 minute follow up with me on Sept 1 for smoking cessation  Thanks  Kristine Allison

## 2019-05-09 NOTE — TOC Initial Note (Signed)
Transition of Care [facility]) - Initial/Assessment Note    Patient Details  Name: Kristine Allison MRN: [id number] Date of Birth: July 11, 1981  Transition of Care [facility]) CM/SW Contact:    Candie Chroman, LCSW Phone Number: 05/09/2019, 3:05 PM  Clinical Narrative: CSW met with patient and introduced role. CSW woke her up from her nap. Patient confirmed she does not have a PCP. CSW provided booklet for free and low-cost healthcare options in [place]. Patient works full time and is able to afford her insulin. CSW inquired about discussion with MD about tobacco cessation. CSW provided information on the smoking cessation program through [facility]. There are both day and evening classes. No further concerns. CSW encouraged patient to contact CSW as needed. CSW will continue to follow patient for support and facilitate return home once stable.                 Expected Discharge Plan: Home/Self Care Barriers to Discharge: Continued Medical Work up   Patient Goals and CMS Choice        Expected Discharge Plan and Services Expected Discharge Plan: Home/Self Care     Post Acute Care Choice: NA Living arrangements for the past 2 months: Single Family Home                                      Prior Living Arrangements/Services Living arrangements for the past 2 months: Single Family Home   Patient language and need for interpreter reviewed:: Yes Do you feel safe going back to the place where you live?: Yes      Need for Family Participation in Patient Care: Yes (Comment) Care giver support system in place?: Yes (comment)   Criminal Activity/Legal Involvement Pertinent to Current Situation/Hospitalization: No - Comment as needed  Activities of Daily Living      Permission Sought/Granted                  Emotional Assessment Appearance:: Appears stated age Attitude/Demeanor/Rapport: Engaged Affect (typically observed): Calm Orientation: : Oriented to Self,  Oriented to Place, Oriented to  Time, Oriented to Situation Alcohol / Substance Use: Never Used Psych Involvement: No (comment)  Admission diagnosis:  Diabetic ketoacidosis without coma associated with type 1 diabetes mellitus ([place]) [E10.10] Patient Active Problem List   Diagnosis Date Noted  . Malnutrition of moderate degree 05/09/2019  . DKA, type 1 ([place]) 05/08/2019  . Anxiety 08/07/2018  . DKA (diabetic ketoacidoses) ([place]) 08/07/2018  . Edema of the tongue 03/26/2018  . Dehydration   . Diabetic ketoacidosis without coma associated with type 1 diabetes mellitus ([place])   . Yeast infection 02/09/2015  . Acute renal failure syndrome ([place])   . Tachycardia 11/03/2014  . Insomnia 09/08/2014  . Dizziness 09/08/2014  . Migraine, unspecified 12/14/2013  . Elevated troponin I level 11/19/2013  . Acute myocardial infarction, subendocardial infarction, initial episode of care ([place]) 11/18/2013  . Noncompliance with medication regimen 11/15/2013  . Leg pain 09/08/2013  . Loss of weight 09/08/2013  . Depression 09/08/2013  . Hypotension, unspecified 09/08/2013  . Tobacco use disorder 08/01/2013  . Diabetes mellitus type 1, uncontrolled ([place]) 06/24/2012  . GERD (gastroesophageal reflux disease) 06/17/2012   PCP:  Patient, No Pcp Per Pharmacy:   [facility] [address], [place] - [place] [address] Phone: [phone] Fax: [place] (  [place]), [place] - [address] [place] ([place]) [place] [address] Phone: [phone] Fax: [phone]     Social Determinants of Health (SDOH) Interventions    Readmission Risk Interventions No flowsheet data found.

## 2019-05-09 NOTE — Progress Notes (Signed)
Initial Nutrition Assessment  DOCUMENTATION CODES:   Non-severe (moderate) malnutrition in context of social or environmental circumstances  INTERVENTION:  Provide Ensure Max Protein po BID, each supplement provides 150 kcal and 30 grams of protein.  Patient refused education on carbohydrate counting and reports she has no education needs.  NUTRITION DIAGNOSIS:   Moderate Malnutrition related to social / environmental circumstances(inadequate oral intake) as evidenced by mild fat depletion, mild-moderate muscle depletion.  GOAL:   Patient will meet greater than or equal to 90% of their needs  MONITOR:   PO intake, Supplement acceptance, Labs, Weight trends, I & O's  REASON FOR ASSESSMENT:   Other (Comment)(Low BMI)    ASSESSMENT:   38 year old female with PMHx of asthma, anxiety, DM type 1 admitted with DKA.   Met with patient at bedside. She was not feeling well and upset that she keeps getting interrupted today, so she did not provide a very thorough history. She reports she has had a decreased appetite for a while now. She attempts to eat 2-3 meals per day but experiences abdominal pain and occasional nausea with meals. She reports she has only been sipping on liquids here and has not had anything to eat yet. She is amenable to trying Ensure Max Protein. Discussed importance of adequate intake of calories and protein. Patient refuses any education on carbohydrate counting. She reports her mother helps her and she has apps that can count carbohydrates.  Patient reports her UBW is around 120 lbs. She does not believe she has lost much weight and does not believe the current weight of 48.4 kg (106.7 lbs) is accurate. It is possible that bed scale was not zeroed correctly.  Medications reviewed and include: Novolog 0-15 units TID, Novolog 0-5 units QHS, Lantus 10 units QHS.  Labs reviewed: CBG 80-106, Sodium 131.  Discussed on rounds this morning.  NUTRITION - FOCUSED  PHYSICAL EXAM:    Most Recent Value  Orbital Region  No depletion  Upper Arm Region  Mild depletion  Thoracic and Lumbar Region  Mild depletion  Buccal Region  No depletion  Temple Region  No depletion  Clavicle Bone Region  Moderate depletion  Clavicle and Acromion Bone Region  Mild depletion  Scapular Bone Region  Mild depletion  Dorsal Hand  No depletion  Patellar Region  Moderate depletion  Anterior Thigh Region  Moderate depletion  Posterior Calf Region  Moderate depletion  Edema (RD Assessment)  None  Hair  Reviewed  Eyes  Reviewed  Mouth  Reviewed  Skin  Reviewed  Nails  Reviewed     Diet Order:   Diet Order            Diet heart healthy/carb modified Room service appropriate? Yes; Fluid consistency: Thin  Diet effective now             EDUCATION NEEDS:   Not appropriate for education at this time(patient refused education 8/17)  Skin:  Skin Assessment: Reviewed RN Assessment  Last BM:  05/07/2019 per chart  Height:   Ht Readings from Last 1 Encounters:  05/08/19 '5\' 4"'  (1.626 m)   Weight:   Wt Readings from Last 1 Encounters:  05/08/19 48.4 kg   Ideal Body Weight:  54.5 kg  BMI:  Body mass index is 18.32 kg/m.  Estimated Nutritional Needs:   Kcal:  1400-1600  Protein:  70-80 grams  Fluid:  1.4-1.6 L/day  Willey Blade, MS, RD, LDN Office: [phone] Pager: [phone] After Hours/Weekend Pager: [phone]

## 2019-05-09 NOTE — Progress Notes (Addendum)
Inpatient Diabetes Program Recommendations  AACE/ADA: New Consensus Statement on Inpatient Glycemic Control (2015)  Target Ranges:  Prepandial:   less than 140 mg/dL      Peak postprandial:   less than 180 mg/dL (1-2 hours)      Critically ill patients:  140 - 180 mg/dL   Results for PAMLA, PANGLE (MRN [id number]) as of 05/09/2019 07:11  Ref. Range 05/08/2019 17:27 05/08/2019 20:40 05/08/2019 21:43 05/08/2019 22:40 05/08/2019 23:50 05/09/2019 00:46 05/09/2019 01:44 05/09/2019 02:38 05/09/2019 03:33 05/09/2019 04:26 05/09/2019 05:36  Glucose-Capillary Latest Ref Range: 70 - 99 mg/dL 457 (H) 242 (H)  IV Insulin Drip 266 (H)  IV Insulin Drip 240 (H)  IV Insulin Drip 206 (H)  IV Insulin Drip 207 (H)  IV Insulin Drip 164 (H)  IV Insulin Drip 142 (H)  IV Insulin Drip 119 (H)  IV Insulin Drip 106 (H)  IV Insulin Drip 84  IV Insulin Drip   Results for ADALI, PENNINGS (MRN [id number]) as of 05/09/2019 07:11  Ref. Range 01/14/2019 05:05 05/06/2019 10:27  Hemoglobin A1C Latest Ref Range: 4.8 - 5.6 % 10.9 (H)  (266 mg/dl) 9.9 (H)  (237 mg/dl)    Admit with: DKA  History: Type 1 Diabetes  Home DM Meds: 70/30 Insulin 20 units TID (pt states 10 units TID)  Current Orders: IV Insulin Drip      Lantus 10 units QHS      Novolog Moderate Correction Scale/ SSI (0-15 units) Q4 hours     Just discharged home on 08/15 after hospitalization for DKA. Back to the ED yesterday PM with DKA again.  Reported to MD that she did not feel well at home and did not take her insulin.  Well known to the Inpatient Diabetes Program.  Was counseled by the DM Coordinator on 01/14/2019 and 05/06/2019.     MD- Looks like transition to SQ insulin has been initiated this AM.  Patient will need basal insulin asap--Do not see that Lantus was administered prior to IV Insulin drip being stopped this AM.  Note that Lantus not ordered to start until bedtime tonight.  Please consider the  following:   1. Change Lantus to 15 units Daily--please start this AM asap  This would be about 80% of the long-acting insulin pt receives in her 70/30 insulin doses at home throughout the day   2. Decrease Novolog SSi to Sensitive scale (0-9 units) Q4 hours      --Will follow patient during hospitalization--  Wyn Quaker RN, MSN, CDE Diabetes Coordinator Inpatient Glycemic Control Team Team Pager: [phone] (8a-5p)

## 2019-05-09 NOTE — Consult Note (Signed)
PULMONARY / CRITICAL CARE MEDICINE  Name: Kristine Allison MRN: [id number] DOB: 1980/12/13    LOS: 1  Referring Provider: Gardiner Barefoot, NP Reason for Referral: DKA Brief patient description: 38 year old female who rest of type 1 diabetes admitted with DKA  HPI: This is 38 year old female with type I DM and asthma who presented with severe hyperglycemia and generalized weakness.  Patient was admitted on 05/06/2019 and discharged home on 05/07/2019.  She returned to the ED today with complaints of ill health and reported not taking her insulin.  Her ED work-up showed a blood glucose level of 428, CO2 of 11, potassium of 5.3 and a WBC of 14.7.  She also reported anorexia, nausea and generalized weakness.  She is being admitted to the ICU for DKA management   Past Medical History:  Diagnosis Date  . Anxiety   . Asthma   . Diabetes mellitus without complication ([place])   . WYOVZCHY(850.2)    Past Surgical History:  Procedure Laterality Date  . NO PAST SURGERIES     No current facility-administered medications on file prior to encounter.    Current Outpatient Medications on File Prior to Encounter  Medication Sig  . insulin NPH-regular Human (NOVOLIN 70/30) (70-30) 100 UNIT/ML injection 20 units subcu twice daily (Patient taking differently: Inject 20 Units into the skin 3 (three) times daily. )  . ondansetron (ZOFRAN) 4 MG tablet Take 1 tablet (4 mg total) by mouth every 6 (six) hours as needed for nausea.    Allergies Allergies  Allergen Reactions  . Latex Rash    Family History Family History  Problem Relation Age of Onset  . Heart disease Other        No family history   Social History  reports that she has been smoking cigarettes. She has a 5.00 pack-year smoking history. She has never used smokeless tobacco. She reports that she does not drink alcohol or use drugs.  Review Of Systems:   Constitutional: Negative for fever and chills.  HENT: Negative for congestion  and rhinorrhea.  Eyes: Negative for redness and visual disturbance.  Respiratory: Negative for shortness of breath and wheezing.  Cardiovascular: Negative for chest pain and palpitations.  Gastrointestinal: Positive for nausea but negative for vomiting and abdominal pain Genitourinary: Negative for dysuria and urgency.  Endocrine: Reports polyuria, and polydipsia Musculoskeletal: generalized weakness Skin: Negative for pallor and wound.  Neurological: Negative for dizziness and headaches   VITAL SIGNS: BP (!) 93/55   Pulse 89   Temp 98.5 F (36.9 C) (Oral)   Resp 14   Ht 5\' 4"  (1.626 m)   Wt 48.4 kg   LMP 10/23/2013   SpO2 99%   BMI 18.32 kg/m   HEMODYNAMICS:    VENTILATOR SETTINGS:    INTAKE / OUTPUT: No intake/output data recorded.  PHYSICAL EXAMINATION: General:  Acutely ill-looking HEENT:  PERRLA, no JVD Neuro:  AAO X 4, no focal deficits Cardiovascular: Apical pulse regular, S1-S2, no murmur regurg or gallop, +2 pulses bilaterally, no edema Lungs: Clear to auscultation bilaterally Abdomen: Distended, normal bowel sounds in all 4 quadrants, palpation reveals no organomegaly Musculoskeletal: Positive range of motion, no deformities Skin: Warm and dry  LABS:  BMET Recent Labs  Lab 05/08/19 1744 05/08/19 2329 05/09/19 0321  NA 132* 133* 131*  K 5.3* 4.4 3.7  CL 92* 95* 95*  CO2 11* 18* 23  BUN 26* 21* 19  CREATININE 1.30* 0.88 0.64  GLUCOSE 428* 243* 127*  Electrolytes Recent Labs  Lab 05/08/19 1744 05/08/19 2329 05/09/19 0321  CALCIUM 9.3 8.6* 8.4*    CBC Recent Labs  Lab 05/06/19 0233 05/06/19 0550 05/08/19 1744  WBC 8.0 13.0* 14.7*  HGB 15.0 13.5 15.1*  HCT 45.5 41.7 46.2*  PLT 297 202 264    Coag's No results for input(s): APTT, INR in the last 168 hours.  Sepsis Markers No results for input(s): LATICACIDVEN, PROCALCITON, O2SATVEN in the last 168 hours.  ABG No results for input(s): PHART, PCO2ART, PO2ART in the last 168  hours.  Liver Enzymes Recent Labs  Lab 05/06/19 0233 05/08/19 1744  AST 14* 19  ALT 16 22  ALKPHOS 95 101  BILITOT 2.0* 1.7*  ALBUMIN 4.7 4.7    Cardiac Enzymes No results for input(s): TROPONINI, PROBNP in the last 168 hours.  Glucose Recent Labs  Lab 05/08/19 2350 05/09/19 0046 05/09/19 0144 05/09/19 0238 05/09/19 0333 05/09/19 0426  GLUCAP 206* 207* 164* 142* 119* 106*    Imaging No results found.  SIGNIFICANT EVENTS: 05/07/2019: Discharged 05/08/2019: Readmitted  LINES/TUBES: Peripheral  DISCUSSION: 38 year old female presenting with recurrent DKA likely due to medication nonadherence  ASSESSMENT DKA Uncontrolled type 1 diabetes Generalized weakness History of asthma Tobacco use disorder    PLAN DKA protocol with IV insulin BMP every 4 hours Adjust and titrate fluids based on potassium, glucose and bicarb levels Consult to diabetes coordinator IV hydration Smoking cessation advice given  Best Practice: Code Status: Full code Diet: Clear liquid diet GI prophylaxis: Not indicated VTE prophylaxis: SCDs and Lovenox  FAMILY  - Updates: Patient updated on current treatment plan will update with any changes   S. Tukov-Yual ANP-BC Pulmonary and Critical Care Medicine [facility] Pager [phone] or [phone]  NB: This document was prepared using Dragon voice recognition software and may include unintentional dictation errors.    05/09/2019, 4:31 AM

## 2019-05-09 NOTE — Progress Notes (Signed)
Patient ID: Kristine Allison, female   DOB: 11/04/1980, 38 y.o.   MRN: [id number]  [employer] [facility] PROGRESS NOTE  Kristine Allison GEX:[id number] DOB: 11/04/1980 DOA: 05/08/2019 PCP: Patient, No Pcp Per  HPI/Subjective: Patient came in with back pain, headache nausea vomiting.  Patient still not feeling well.  Mother concerned that she needs to be much better prior to coming home.  The patient states that her mother checks her sugars.  Objective: Vitals:   05/09/19 1300 05/09/19 1400  BP:    Pulse: 82 73  Resp: 15 (!) 22  Temp:    SpO2: 97% 96%    Intake/Output Summary (Last 24 hours) at 05/09/2019 1501 Last data filed at 05/09/2019 0942 Gross per 24 hour  Intake 2518.01 ml  Output 0 ml  Net 2518.01 ml   Filed Weights   05/08/19 1703 05/08/19 2036  Weight: 52.2 kg 48.4 kg    ROS: Review of Systems  Constitutional: Negative for chills and fever.  Eyes: Negative for blurred vision.  Respiratory: Negative for cough and shortness of breath.   Cardiovascular: Negative for chest pain.  Gastrointestinal: Positive for nausea. Negative for abdominal pain, constipation, diarrhea and vomiting.  Genitourinary: Negative for dysuria.  Musculoskeletal: Negative for joint pain.  Neurological: Positive for headaches. Negative for dizziness.   Exam: Physical Exam  Constitutional: She is oriented to person, place, and time.  HENT:  Nose: No mucosal edema.  Mouth/Throat: No oropharyngeal exudate or posterior oropharyngeal edema.  Eyes: Pupils are equal, round, and reactive to light. Conjunctivae, EOM and lids are normal.  Neck: No JVD present. Carotid bruit is not present. No edema present. No thyroid mass and no thyromegaly present.  Cardiovascular: S1 normal and S2 normal. Exam reveals no gallop.  No murmur heard. Pulses:      Dorsalis pedis pulses are 2+ on the right side and 2+ on the left side.  Respiratory: No respiratory distress. She has no wheezes. She has no  rhonchi. She has no rales.  GI: Soft. Bowel sounds are normal. There is no abdominal tenderness.  Musculoskeletal:     Right ankle: She exhibits no swelling.     Left ankle: She exhibits no swelling.  Lymphadenopathy:    She has no cervical adenopathy.  Neurological: She is alert and oriented to person, place, and time. No cranial nerve deficit.  Skin: Skin is warm. No rash noted. Nails show no clubbing.  Psychiatric: She has a normal mood and affect.      Data Reviewed: Basic Metabolic Panel: Recent Labs  Lab 05/07/19 0630 05/08/19 1744 05/08/19 2329 05/09/19 0321 05/09/19 0620  NA 132* 132* 133* 131* 131*  K 4.4 5.3* 4.4 3.7 3.5  CL 99 92* 95* 95* 104  CO2 22 11* 18* 23 22  GLUCOSE 390* 428* 243* 127* 80  BUN 16 26* 21* 19 17  CREATININE 0.86 1.30* 0.88 0.64 0.69  CALCIUM 8.4* 9.3 8.6* 8.4* 8.0*   Liver Function Tests: Recent Labs  Lab 05/06/19 0233 05/08/19 1744  AST 14* 19  ALT 16 22  ALKPHOS 95 101  BILITOT 2.0* 1.7*  PROT 7.8 7.5  ALBUMIN 4.7 4.7   Recent Labs  Lab 05/08/19 1744  LIPASE 25   CBC: Recent Labs  Lab 05/06/19 0233 05/06/19 0550 05/08/19 1744 05/09/19 0620  WBC 8.0 13.0* 14.7* 10.8*  NEUTROABS 5.6  --   --   --   HGB 15.0 13.5 15.1* 12.0  HCT 45.5 41.7 46.2* 35.7*  MCV 90.5 92.5 93.0 89.0  PLT 297 202 264 193    CBG: Recent Labs  Lab 05/09/19 0333 05/09/19 0426 05/09/19 0536 05/09/19 0812 05/09/19 1113  GLUCAP 119* 106* 84 80 95    Recent Results (from the past 240 hour(s))  SARS Coronavirus 2 [facility](Hospital order, Performed in [facility] lab) Nasopharyngeal Nasopharyngeal Swab     Status: None   Collection Time: 05/06/19  3:28 AM   Specimen: Nasopharyngeal Swab  Result Value Ref Range Status   SARS Coronavirus 2 NEGATIVE NEGATIVE Final    Comment: (NOTE) If result is NEGATIVE SARS-CoV-2 target nucleic acids are NOT DETECTED. The SARS-CoV-2 RNA is generally detectable in upper and lower  respiratory specimens  during the acute phase of infection. The lowest  concentration of SARS-CoV-2 viral copies this assay can detect is 250  copies / mL. A negative result does not preclude SARS-CoV-2 infection  and should not be used as the sole basis for treatment or other  patient management decisions.  A negative result may occur with  improper specimen collection / handling, submission of specimen other  than nasopharyngeal swab, presence of viral mutation(s) within the  areas targeted by this assay, and inadequate number of viral copies  (<250 copies / mL). A negative result must be combined with clinical  observations, patient history, and epidemiological information. If result is POSITIVE SARS-CoV-2 target nucleic acids are DETECTED. The SARS-CoV-2 RNA is generally detectable in upper and lower  respiratory specimens dur ing the acute phase of infection.  Positive  results are indicative of active infection with SARS-CoV-2.  Clinical  correlation with patient history and other diagnostic information is  necessary to determine patient infection status.  Positive results do  not rule out bacterial infection or co-infection with other viruses. If result is PRESUMPTIVE POSTIVE SARS-CoV-2 nucleic acids MAY BE PRESENT.   A presumptive positive result was obtained on the submitted specimen  and confirmed on repeat testing.  While 2019 novel coronavirus  (SARS-CoV-2) nucleic acids may be present in the submitted sample  additional confirmatory testing may be necessary for epidemiological  and / or clinical management purposes  to differentiate between  SARS-CoV-2 and other Sarbecovirus currently known to infect humans.  If clinically indicated additional testing with an alternate test  methodology [phone]([id number]) is advised. The SARS-CoV-2 RNA is generally  detectable in upper and lower respiratory sp ecimens during the acute  phase of infection. The expected result is Negative. Fact Sheet for Patients:   BoilerBrush.com.cyhttps://www.fda.gov/media/136312/download Fact Sheet for Healthcare Providers: https://pope.com/https://www.fda.gov/media/136313/download This test is not yet approved or cleared by the [place] FDA and has been authorized for detection and/or diagnosis of SARS-CoV-2 by FDA under an Emergency Use Authorization (EUA).  This EUA will remain in effect (meaning this test can be used) for the duration of the COVID-19 declaration under Section 564(b)(1) of the Act, 21 U.S.C. section 360bbb-3(b)(1), unless the authorization is terminated or revoked sooner. Performed at [facility], [address] [place], [place] [address]   MRSA PCR Screening     Status: None   Collection Time: 05/06/19  7:52 AM   Specimen: Nasopharyngeal  Result Value Ref Range Status   MRSA by PCR NEGATIVE NEGATIVE Final    Comment:        The GeneXpert MRSA Assay (FDA approved for NASAL specimens only), is one component of a comprehensive MRSA colonization surveillance program. It is not intended to diagnose MRSA infection nor to guide or monitor treatment  for MRSA infections. Performed at [facility], [address] [place], [place] [address]   MRSA PCR Screening     Status: None   Collection Time: 05/08/19  8:38 PM   Specimen: Nasal Mucosa; Nasopharyngeal  Result Value Ref Range Status   MRSA by PCR NEGATIVE NEGATIVE Final    Comment:        The GeneXpert MRSA Assay (FDA approved for NASAL specimens only), is one component of a comprehensive MRSA colonization surveillance program. It is not intended to diagnose MRSA infection nor to guide or monitor treatment for MRSA infections. Performed at [facility], [address] [place], [place] [address]      Studies: No results found.  Scheduled Meds: . Chlorhexidine Gluconate Cloth  6 each Topical Q0600  . enoxaparin (LOVENOX) injection  40 mg Subcutaneous Q24H  . insulin aspart  0-15 Units Subcutaneous TID WC  . insulin aspart   0-5 Units Subcutaneous QHS  . insulin glargine  10 Units Subcutaneous QHS  . Ensure Max Protein  11 oz Oral BID BM   Continuous Infusions: . sodium chloride      Assessment/Plan:  1. Diabetic ketoacidosis.  Patient was given IV fluids and switched over the Lantus insulin and short acting insulin.  Seems like patient is not taking the active role in taking care of herself.  Her mother checks her sugars and the patient does not do it very often.  I explained that we need to control her diabetes in order to avoid complications down the line. 2. Weakness. 3. Tobacco abuse 4. Asthma.  Respiratory status stable. 5. Protein calorie malnutrition on Ensure.  Code Status:     Code Status Orders  (From admission, onward)         Start     Ordered   05/08/19 1921  Full code  Continuous     05/08/19 1920        Code Status History    Date Active Date Inactive Code Status Order ID Comments User Context   05/06/2019 0328 05/07/2019 1922 Full Code [id number]  Pearletha AlfredSeals, Angela H, NP ED   01/13/2019 1912 01/18/2019 1645 Full Code [id number]  Milagros LollSudini, Srikar, MD ED   08/07/2018 2251 08/09/2018 2113 Full Code [id number]  Oralia ManisWillis, David, MD ED   03/25/2018 1136 03/26/2018 1708 Full Code [id number]  Belva BertinHodge, Ellen W, RN Inpatient   03/22/2017 2140 03/24/2017 1842 Full Code [id number]  Enid BaasKalisetti, Radhika, MD Inpatient   10/02/2015 1355 10/03/2015 1830 Full Code [id number]  Altamese DillingVachhani, Vaibhavkumar, MD Inpatient   09/25/2015 0323 09/25/2015 2020 Full Code [id number]  Myra RudeSchmitz, Jeremy E, MD ED   07/25/2015 1733 07/27/2015 1525 Full Code [id number]  Zannie CoveJoseph, Preetha, MD Inpatient   05/03/2015 1522 05/05/2015 1634 Full Code [id number]  Onnie BoerEmokpae, Ejiroghene E, MD ED   02/09/2015 2256 02/11/2015 1641 Full Code [id number]  Raliegh IpGottschalk, Ashly M, DO Inpatient   12/21/2014 0843 12/23/2014 2053 Full Code [id number]  Bernadene PersonWhiteheart, Kathryn A, NP ED   08/20/2014 0543 08/22/2014 1903 Full Code [id number]  Oralia ManisWillis, David, MD Inpatient   12/02/2013 0210 12/03/2013  1700 Full Code [id number]  Leona Singletonhekkekandam, Maria T, MD Inpatient   11/15/2013 1532 11/20/2013 1631 Full Code [id number]  Jeanella Crazellis, Brandi L, NP ED   07/26/2013 2259 07/29/2013 1830 Full Code [id number]  Charm RingsHonig, Erin J, MD Inpatient   06/05/2013 2338 06/07/2013 1822 Full Code [id number]  Glori LuisSonnenberg, Eric G, MD Inpatient   06/05/2013 2338 06/05/2013 2338 Full Code [id number]  Glori LuisSonnenberg, Eric G, MD Inpatient  Advance Care Planning Activity     Family Communication: Spoke with mother on the phone Disposition Plan: To be determined  Time spent: 28 minutes  [place]

## 2019-05-09 NOTE — Discharge Summary (Signed)
[employer] [facility] - [place] at [facility]   PATIENT NAME: Kristine LuriaMargie Simmons Allison    MR#:  [id number]  DATE OF BIRTH:  [birth year]  DATE OF ADMISSION:  05/06/2019 ADMITTING PHYSICIAN: [name] A Mansy, MD  DATE OF DISCHARGE: 05/07/2019  4:22 PM  PRIMARY CARE PHYSICIAN: Patient, No Pcp Per   ADMISSION DIAGNOSIS:  Diabetic ketoacidosis without coma associated with type 1 diabetes mellitus (HCC) [E10.10]  DISCHARGE DIAGNOSIS:  Active Problems:   DKA (diabetic ketoacidoses) (HCC)   SECONDARY DIAGNOSIS:   Past Medical History:  Diagnosis Date  . Anxiety   . Asthma   . Diabetes mellitus without complication (HCC)   . Headache(784.0)      ADMITTING HISTORY  Kristine Allison  is a 38 y.o. female with a known history of diabetes mellitus type 1, anxiety, asthma.  She has a history of frequent hospital admissions for DKA.  She presented to the emergency room from home for evaluation of hyperglycemia.  She reports a 2-day history of glucose 300-500.  She is having frequent episodes of nausea and vomiting.  She denies diarrhea, fever, chills, chest pain, shortness of breath, or cough.  She denies hematemesis, hematochezia, or melena.  She reports pain is epigastric in nature radiating through to her back which is typical for her when she is in DKA according to her report.  She denies experiencing dysuria or hematuria.  She denies dark or malodorous urine.  She denies urine urgency or frequency.  Chest x-ray was completed with no acute pulmonary disease.  Glucose on arrival is 400.  Venous blood gas was obtained with pH of 7.24 and bicarbonate 14.6.  Anion gap is 25.  She has been admitted to the hospitalist service with care to be transferred to the intensivist in the ICU stepdown unit.  HOSPITAL COURSE:   *DKA *Pseudohyponatremia *Acute kidney injury *Vomiting  Patient was admitted to ICU on DKA protocol.  IV insulin, IV fluids, Accu-Cheks and BMP.  She improved well and transferred  to medical floor.  Blood sugars improved and prior to discharge into 200s which is her baseline.  Advised patient to be compliant with insulin and restarted her insulin 70/30 at home dose.  Patient did not eat much prior to discharge but tells me that she does not like the food and is not appetizing in the hospital.  She wanted to return to work and gave her letter for work.  Patient discharged home.  [facility] CONSULTS OBTAINED:    DRUG ALLERGIES:   Allergies  Allergen Reactions  . Latex Rash    DISCHARGE MEDICATIONS:   Allergies as of 05/07/2019      Reactions   Latex Rash      Medication List    STOP taking these medications   sertraline 25 MG tablet Commonly known as: ZOLOFT     TAKE these medications   insulin NPH-regular Human (70-30) 100 UNIT/ML injection Commonly known as: NovoLIN 70/30 20 units subcu twice daily What changed:   how much to take  how to take this  when to take this  additional instructions   ondansetron 4 MG tablet Commonly known as: ZOFRAN Take 1 tablet (4 mg total) by mouth every 6 (six) hours as needed for nausea.       Today   VITAL SIGNS:  Blood pressure 107/65, pulse 71, temperature 98.1 F (36.7 C), temperature source Oral, resp. rate 18, height 5\' 4"  (1.626 m), weight 56.7 kg, last menstrual period  10/23/2013, SpO2 97 %.  I/O:  No intake or output data in the 24 hours ending 05/09/19 1431  PHYSICAL EXAMINATION:  Physical Exam  GENERAL:  38 y.o.-year-old patient lying in the bed with no acute distress.  LUNGS: Normal breath sounds bilaterally, no wheezing, rales,rhonchi or crepitation. No use of accessory muscles of respiration.  CARDIOVASCULAR: S1, S2 normal. No murmurs, rubs, or gallops.  ABDOMEN: Soft, non-tender, non-distended. Bowel sounds present. No organomegaly or mass.  NEUROLOGIC: Moves all 4 extremities. PSYCHIATRIC: The patient is alert and oriented x 3.  SKIN: No obvious rash, lesion, or  ulcer.   DATA REVIEW:   CBC Recent Labs  Lab 05/09/19 0620  WBC 10.8*  HGB 12.0  HCT 35.7*  PLT 193    Chemistries  Recent Labs  Lab 05/08/19 1744  05/09/19 0620  NA 132*   < > 131*  K 5.3*   < > 3.5  CL 92*   < > 104  CO2 11*   < > 22  GLUCOSE 428*   < > 80  BUN 26*   < > 17  CREATININE 1.30*   < > 0.69  CALCIUM 9.3   < > 8.0*  AST 19  --   --   ALT 22  --   --   ALKPHOS 101  --   --   BILITOT 1.7*  --   --    < > = values in this interval not displayed.    Cardiac Enzymes No results for input(s): TROPONINI in the last 168 hours.  Microbiology Results  Results for orders placed or performed during the hospital encounter of 05/06/19  SARS Coronavirus 2 [facility] order, Performed in [facility] hospital lab) Nasopharyngeal Nasopharyngeal Swab     Status: None   Collection Time: 05/06/19  3:28 AM   Specimen: Nasopharyngeal Swab  Result Value Ref Range Status   SARS Coronavirus 2 NEGATIVE NEGATIVE Final    Comment: (NOTE) If result is NEGATIVE SARS-CoV-2 target nucleic acids are NOT DETECTED. The SARS-CoV-2 RNA is generally detectable in upper and lower  respiratory specimens during the acute phase of infection. The lowest  concentration of SARS-CoV-2 viral copies this assay can detect is 250  copies / mL. A negative result does not preclude SARS-CoV-2 infection  and should not be used as the sole basis for treatment or other  patient management decisions.  A negative result may occur with  improper specimen collection / handling, submission of specimen other  than nasopharyngeal swab, presence of viral mutation(s) within the  areas targeted by this assay, and inadequate number of viral copies  (<250 copies / mL). A negative result must be combined with clinical  observations, patient history, and epidemiological information. If result is POSITIVE SARS-CoV-2 target nucleic acids are DETECTED. The SARS-CoV-2 RNA is generally detectable in upper and lower   respiratory specimens dur ing the acute phase of infection.  Positive  results are indicative of active infection with SARS-CoV-2.  Clinical  correlation with patient history and other diagnostic information is  necessary to determine patient infection status.  Positive results do  not rule out bacterial infection or co-infection with other viruses. If result is PRESUMPTIVE POSTIVE SARS-CoV-2 nucleic acids MAY BE PRESENT.   A presumptive positive result was obtained on the submitted specimen  and confirmed on repeat testing.  While 2019 novel coronavirus  (SARS-CoV-2) nucleic acids may be present in the submitted sample  additional confirmatory testing may be necessary for epidemiological  and / or clinical management purposes  to differentiate between  SARS-CoV-2 and other Sarbecovirus currently known to infect humans.  If clinically indicated additional testing with an alternate test  methodology [phone]) is advised. The SARS-CoV-2 RNA is generally  detectable in upper and lower respiratory sp ecimens during the acute  phase of infection. The expected result is Negative. Fact Sheet for Patients:  BoilerBrush.com.cyhttps://www.fda.gov/media/136312/download Fact Sheet for Healthcare Providers: https://pope.com/https://www.fda.gov/media/136313/download This test is not yet approved or cleared by the [place] FDA and has been authorized for detection and/or diagnosis of SARS-CoV-2 by FDA under an Emergency Use Authorization (EUA).  This EUA will remain in effect (meaning this test can be used) for the duration of the COVID-19 declaration under Section 564(b)(1) of the Act, 21 U.S.C. section 360bbb-3(b)(1), unless the authorization is terminated or revoked sooner. Performed at [facility], [address] [place], [place] [address]   MRSA PCR Screening     Status: None   Collection Time: 05/06/19  7:52 AM   Specimen: Nasopharyngeal  Result Value Ref Range Status   MRSA by PCR NEGATIVE NEGATIVE  Final    Comment:        The GeneXpert MRSA Assay (FDA approved for NASAL specimens only), is one component of a comprehensive MRSA colonization surveillance program. It is not intended to diagnose MRSA infection nor to guide or monitor treatment for MRSA infections. Performed at [facility], [address] [place], [place] [address]     RADIOLOGY:  No results found.  Follow up with PCP in 1 week.  Management plans discussed with the patient, family and they are in agreement.  CODE STATUS:  Code Status History    Date Active Date Inactive Code Status Order ID Comments User Context   05/06/2019 0328 05/07/2019 1922 Full Code [id number]  Pearletha AlfredSeals, Angela H, NP ED   01/13/2019 1912 01/18/2019 1645 Full Code [id number]  Milagros LollSudini, Verne Lanuza, MD ED   08/07/2018 2251 08/09/2018 2113 Full Code [id number]  Oralia ManisWillis, David, MD ED   03/25/2018 1136 03/26/2018 1708 Full Code [id number]  Belva BertinHodge, Ellen W, RN Inpatient   03/22/2017 2140 03/24/2017 1842 Full Code [id number]  Enid BaasKalisetti, Radhika, MD Inpatient   10/02/2015 1355 10/03/2015 1830 Full Code [id number]  Altamese DillingVachhani, Vaibhavkumar, MD Inpatient   09/25/2015 0323 09/25/2015 2020 Full Code [id number]  Myra RudeSchmitz, Jeremy E, MD ED   07/25/2015 1733 07/27/2015 1525 Full Code [id number]  Zannie CoveJoseph, Preetha, MD Inpatient   05/03/2015 1522 05/05/2015 1634 Full Code [id number]  Onnie BoerEmokpae, Ejiroghene E, MD ED   02/09/2015 2256 02/11/2015 1641 Full Code [id number]  Raliegh IpGottschalk, Ashly M, DO Inpatient   12/21/2014 0843 12/23/2014 2053 Full Code [id number]  Bernadene PersonWhiteheart, Kathryn A, NP ED   08/20/2014 0543 08/22/2014 1903 Full Code [id number]  Oralia ManisWillis, David, MD Inpatient   12/02/2013 0210 12/03/2013 1700 Full Code [id number]  Leona Singletonhekkekandam, Maria T, MD Inpatient   11/15/2013 1532 11/20/2013 1631 Full Code [id number]  Jeanella Crazellis, Brandi L, NP ED   07/26/2013 2259 07/29/2013 1830 Full Code [id number]  Charm RingsHonig, Erin J, MD Inpatient   06/05/2013 2338 06/07/2013 1822 Full Code [id number]  Glori LuisSonnenberg, Eric G, MD Inpatient    06/05/2013 2338 06/05/2013 2338 Full Code [id number]  Glori LuisSonnenberg, Eric G, MD Inpatient   Advance Care Planning Activity      TOTAL TIME TAKING CARE OF THIS PATIENT ON DAY OF DISCHARGE: more than 30 minutes.   Orie FishermanSrikar R Odie Edmonds M.D on 05/09/2019 at 2:31 PM  Between 7am to 6pm - Pager - [phone]  After 6pm go to [email or web address] - password [email or web address]  [facility] [place] [facility]  Office  [phone]  CC: Primary care physician; Patient, No Pcp Per  Note: This dictation was prepared with Dragon dictation along with smaller phrase technology. Any transcriptional errors that result from this process are unintentional.

## 2019-05-09 NOTE — Progress Notes (Signed)
Name: Kristine ElliotMargie E Simmons Allison MRN: [id number] DOB: 05/12/1981     CONSULTATION DATE: 05/08/2019  CHIEF COMPLAINT:  Hyperglycemia.  HISTORY OF PRESENT ILLNESS:  Briefly, this is a 38 year old female wit history of T1DM, controlled with Novolin 70/30 non-prescription insulin, and asthma seen here in the ICU for management of DKA with hyperglycemia, and resultant metabolic acidosis. Patient was seen on 8/14 and admitted for similar presentation of DKA, and was discharged on 05/07/2019. After discharge, the patient began to feel more ill, noting chest/back pain, hyperglycemia and nausea and vomiting, with anorexia. EMS was called, and initial BG was > 400. She was brought to the ED, and initial labs indicated DKA with metabolic acidosis (Cl 92, CO2 11, AG 29) with BG 428, and hyperkalemia with K+ of 5.3. She was started on IVF and insulin gtt, and brought to the ICU for further management.   Presently she notes a headache and lightheadedness, and continued but improved nausea w/ emesis and chest/back pain. She describes her mid/lower back pain as being dull and constant, with pounding sensation, pain 10/10. She notes chest pain as well, worse with eating. She typically experiences this chest/back pain when she has hyperglycemia. Denies shortness of breath, palpitations, abdominal pain, dysuria, pyuria, hematuria.   PAST MEDICAL HISTORY :   has a past medical history of Anxiety, Asthma, Diabetes mellitus without complication (HCC), and Headache(784.0).  has a past surgical history that includes No past surgeries. Prior to Admission medications   Medication Sig Start Date End Date Taking? Authorizing Provider  insulin NPH-regular Human (NOVOLIN 70/30) (70-30) 100 UNIT/ML injection 20 units subcu twice daily Patient taking differently: Inject 20 Units into the skin 3 (three) times daily.  03/26/18  Yes Katha HammingKonidena, Snehalatha, MD  ondansetron (ZOFRAN) 4 MG tablet Take 1 tablet (4 mg total) by mouth every 6  (six) hours as needed for nausea. 01/18/19  Yes Altamese DillingVachhani, Vaibhavkumar, MD   Allergies  Allergen Reactions  . Latex Rash   REVIEW OF SYSTEMS:   Review of Systems  Constitutional: Positive for malaise/fatigue. Negative for chills and fever.  HENT: Negative for congestion, ear discharge, hearing loss and sore throat.   Eyes: Negative for blurred vision, double vision and pain.  Respiratory: Negative for cough, shortness of breath and wheezing.   Cardiovascular: Negative for chest pain and palpitations.  Gastrointestinal: Positive for abdominal pain (epigastric), nausea and vomiting. Negative for constipation and diarrhea.       Positive for anorexia.  Genitourinary: Negative for dysuria and hematuria.       Negative for pyuria.  Musculoskeletal: Positive for back pain (Mid/lower) and neck pain.  Neurological: Positive for headaches. Negative for tingling, sensory change and focal weakness.       Positive for lightheadedness.  Endo/Heme/Allergies: Positive for polydipsia.       Negative for polyuria.   VITAL SIGNS: Temp:  [98.3 F (36.8 C)-98.6 F (37 C)] (P) 98.6 F (37 C) (08/17 0800) Pulse Rate:  [80-114] 80 (08/17 0800) Resp:  [9-18] 14 (08/17 0800) BP: (93-114)/(55-69) 99/64 (08/17 0800) SpO2:  [94 %-100 %] 94 % (08/17 0800) Weight:  [48.4 kg-52.2 kg] 48.4 kg (08/16 2036)  I/O last 3 completed shifts: In: 1497.2 [I.V.:1497.2] Out: -  No intake/output data recorded.  SpO2: 94 %  Physical Examination:  GENERAL: Thin. Laying in bed, in mild distress due to back pain. Teary.  HEAD: Normocephalic, atraumatic.  EYES: Pupils equal, round, reactive to light.  No scleral icterus.  MOUTH: Moist  mucosal membrane. PULMONARY: + wheezing. Otherwise CTAB. CARDIOVASCULAR: S1 and S2. Regular rate and rhythm. No murmurs, rubs, or gallops.  GASTROINTESTINAL: Soft, tender w/ voluntary guarding epigastric region. Non-distended. No masses. Positive bowel sounds. No hepatosplenomegaly.   Extremities: No lower extremity edema. DP pulses 1+ on left, 2+ on right. Radial pulses 2+ bilaterally.  MSK: Slight midline tenderness mid back. NEUROLOGIC: Alert and oriented x 3. SKIN: intact, warm, dry  I personally reviewed lab work that was obtained in last 24 hrs.  MEDICATIONS: I have reviewed all medications and confirmed regimen as documented CBC Latest Ref Rng & Units 05/09/2019 05/08/2019 05/06/2019  WBC 4.0 - 10.5 K/uL 10.8(H) 14.7(H) 13.0(H)  Hemoglobin 12.0 - 15.0 g/dL 12.0 15.1(H) 13.5  Hematocrit 36.0 - 46.0 % 35.7(L) 46.2(H) 41.7  Platelets 150 - 400 K/uL 193 264 202   CMP Latest Ref Rng & Units 05/09/2019 05/09/2019 05/08/2019  Glucose 70 - 99 mg/dL 80 127(H) 243(H)  BUN 6 - 20 mg/dL 17 19 21(H)  Creatinine 0.44 - 1.00 mg/dL 0.69 0.64 0.88  Sodium 135 - 145 mmol/L 131(L) 131(L) 133(L)  Potassium 3.5 - 5.1 mmol/L 3.5 3.7 4.4  Chloride 98 - 111 mmol/L 104 95(L) 95(L)  CO2 22 - 32 mmol/L 22 23 18(L)  Calcium 8.9 - 10.3 mg/dL 8.0(L) 8.4(L) 8.6(L)  Total Protein 6.5 - 8.1 g/dL - - -  Total Bilirubin 0.3 - 1.2 mg/dL - - -  Alkaline Phos 38 - 126 U/L - - -  AST 15 - 41 U/L - - -  ALT 0 - 44 U/L - - -   CULTURE RESULTS   Recent Results (from the past 240 hour(s))  SARS Coronavirus 2 [facility] order, Performed in [facility] hospital lab) Nasopharyngeal Nasopharyngeal Swab     Status: None   Collection Time: 05/06/19  3:28 AM   Specimen: Nasopharyngeal Swab  Result Value Ref Range Status   SARS Coronavirus 2 NEGATIVE NEGATIVE Final    Comment: (NOTE) If result is NEGATIVE SARS-CoV-2 target nucleic acids are NOT DETECTED. The SARS-CoV-2 RNA is generally detectable in upper and lower  respiratory specimens during the acute phase of infection. The lowest  concentration of SARS-CoV-2 viral copies this assay can detect is 250  copies / mL. A negative result does not preclude SARS-CoV-2 infection  and should not be used as the sole basis for treatment or other  patient  management decisions.  A negative result may occur with  improper specimen collection / handling, submission of specimen other  than nasopharyngeal swab, presence of viral mutation(s) within the  areas targeted by this assay, and inadequate number of viral copies  (<250 copies / mL). A negative result must be combined with clinical  observations, patient history, and epidemiological information. If result is POSITIVE SARS-CoV-2 target nucleic acids are DETECTED. The SARS-CoV-2 RNA is generally detectable in upper and lower  respiratory specimens dur ing the acute phase of infection.  Positive  results are indicative of active infection with SARS-CoV-2.  Clinical  correlation with patient history and other diagnostic information is  necessary to determine patient infection status.  Positive results do  not rule out bacterial infection or co-infection with other viruses. If result is PRESUMPTIVE POSTIVE SARS-CoV-2 nucleic acids MAY BE PRESENT.   A presumptive positive result was obtained on the submitted specimen  and confirmed on repeat testing.  While 2019 novel coronavirus  (SARS-CoV-2) nucleic acids may be present in the submitted sample  additional confirmatory testing may be  necessary for epidemiological  and / or clinical management purposes  to differentiate between  SARS-CoV-2 and other Sarbecovirus currently known to infect humans.  If clinically indicated additional testing with an alternate test  methodology [phone]([id number]) is advised. The SARS-CoV-2 RNA is generally  detectable in upper and lower respiratory sp ecimens during the acute  phase of infection. The expected result is Negative. Fact Sheet for Patients:  BoilerBrush.com.cyhttps://www.fda.gov/media/136312/download Fact Sheet for Healthcare Providers: https://pope.com/https://www.fda.gov/media/136313/download This test is not yet approved or cleared by the [place] FDA and has been authorized for detection and/or diagnosis of SARS-CoV-2 by FDA under  an Emergency Use Authorization (EUA).  This EUA will remain in effect (meaning this test can be used) for the duration of the COVID-19 declaration under Section 564(b)(1) of the Act, 21 U.S.C. section 360bbb-3(b)(1), unless the authorization is terminated or revoked sooner. Performed at [facility], [address] [place], [place] [address]   MRSA PCR Screening     Status: None   Collection Time: 05/06/19  7:52 AM   Specimen: Nasopharyngeal  Result Value Ref Range Status   MRSA by PCR NEGATIVE NEGATIVE Final    Comment:        The GeneXpert MRSA Assay (FDA approved for NASAL specimens only), is one component of a comprehensive MRSA colonization surveillance program. It is not intended to diagnose MRSA infection nor to guide or monitor treatment for MRSA infections. Performed at [facility], [address] [place], [place] [address]   MRSA PCR Screening     Status: None   Collection Time: 05/08/19  8:38 PM   Specimen: Nasal Mucosa; Nasopharyngeal  Result Value Ref Range Status   MRSA by PCR NEGATIVE NEGATIVE Final    Comment:        The GeneXpert MRSA Assay (FDA approved for NASAL specimens only), is one component of a comprehensive MRSA colonization surveillance program. It is not intended to diagnose MRSA infection nor to guide or monitor treatment for MRSA infections. Performed at [facility], [address] [place], [place] [address]    IMAGING   No results found.  ASSESSMENT AND PLAN SYNOPSIS: 38 year old female with history as stated above, seen in the ICU for further managment of DKA, with metabolic acidosis and dehydration. As noted above, the patient has been treating her DM without a prescription. Her continued presentation with DKA could be due to her unsupervised treatment of her DM. She is at risk for further complications/hospitalizations, if her DM is not properly controlled, and if she continues to smoke.   DKA, with  closed anion gap - improving - Diabetes Coordinator following, appreciate input.  - Continue sliding scale and basal insulin, as prescribed.  - D/c continuous D5 w/ 1/2 NS (patient diet has been advanced). Continue IVF bolus with NS if pressures begin to drop.  - Will use ICU hypoglycemic\hyperglycemia protocol if needed. - Will assist patient in finding appropriate outpatient diabetes care. Care management following, appreciate input.  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 - Reiterated the importance of smoking cessation, especially in individuals with DM, to reduce complications such as, neuropathy, amputation, nephropathy, etc. - Will set patient up with Dr. Sung AmabileSimonds for an outpatient pulmonology consult to address smoking cessation.   Electrolytes - Will continue to monitor Potassium. Currently at 3.5. - follow labs as needed -replace as needed -pharmacy consultation and following  CARDIAC ICU monitoring  GI GI PROPHYLAXIS as indicated  NUTRITIONAL STATUS DIET-->  Will advance diet - heart healthy/carb modified Constipation protocol as indicated  DVT/GI PRX ordered - Lovenox. TRANSFUSIONS AS NEEDED MONITOR FSBS ASSESS the need for LABS   Disposition: Will transfer to med/surg floor.

## 2019-05-10 LAB — BASIC METABOLIC PANEL
Anion gap: 9 (ref 5–15)
BUN: 12 mg/dL (ref 6–20)
CO2: 23 mmol/L (ref 22–32)
Calcium: 8.2 mg/dL — ABNORMAL LOW (ref 8.9–10.3)
Chloride: 100 mmol/L (ref 98–111)
Creatinine, Ser: 0.51 mg/dL (ref 0.44–1.00)
GFR calc Af Amer: >60 mL/min (ref >60)
GFR calc non Af Amer: >60 mL/min (ref >60)
Glucose, Bld: 164 mg/dL — ABNORMAL HIGH (ref 70–99)
Potassium: 3.9 mmol/L (ref 3.5–5.1)
Sodium: 132 mmol/L — ABNORMAL LOW (ref 135–145)

## 2019-05-10 LAB — GLUCOSE, CAPILLARY
Glucose-Capillary: 166 mg/dL — ABNORMAL HIGH (ref 70–99)
Glucose-Capillary: 224 mg/dL — ABNORMAL HIGH (ref 70–99)

## 2019-05-10 MED ORDER — ENSURE MAX PROTEIN PO LIQD: 11.0000 [oz_av] | Freq: Two times a day (BID) | ORAL | 0 refills | Status: DC

## 2019-05-10 MED ORDER — INSULIN LISPRO (1 UNIT DIAL) 100 UNIT/ML (KWIKPEN): 4.0000 [IU] | PEN_INJECTOR | Freq: Three times a day (TID) | SUBCUTANEOUS | 2 refills | Status: DC

## 2019-05-10 MED ORDER — BASAGLAR KWIKPEN 100 UNIT/ML ~~LOC~~ SOPN: 15.0000 [IU] | PEN_INJECTOR | Freq: Every evening | SUBCUTANEOUS | 2 refills | Status: DC

## 2019-05-10 NOTE — Progress Notes (Signed)
Patient discharged home as ordered,instructions explained and well understood,vital signs within normal limits upon discharge,escorted by staff member via wheelchair  

## 2019-05-10 NOTE — TOC Progression Note (Signed)
Transition of Care [facility]) - Progression Note    Patient Details  Name: Kristine Allison MRN: [id number] Date of Birth: [birth year]  Transition of Care [facility]) CM/SW [place], RN Phone Number: 05/10/2019, 3:26 PM  Clinical Narrative:     Delivered the insulin long acting and short acting and needles for the Pen  to the patient, she verified that yes she does have a latex allergy but the Pen has never bothered her, I explained if she noticed a rash or any other symptom to notify her Doctor immediatly, I also explained due to the Ensure being over the counter that I was not able to obtain it for her, she stated that her mom would help her,   Expected Discharge Plan: Home/Self Care Barriers to Discharge: Barriers Resolved  Expected Discharge Plan and Services Expected Discharge Plan: Home/Self Care     Post Acute Care Choice: NA Living arrangements for the past 2 months: Single Family Home Expected Discharge Date: 05/10/19                                     Social Determinants of Health (SDOH) Interventions    Readmission Risk Interventions No flowsheet data found.

## 2019-05-10 NOTE — TOC Transition Note (Signed)
Transition of Care [facility]) - CM/SW Discharge Note   Patient Details  Name: Carliss Porcaro MRN: [id number] Date of Birth: July 17, 1981  Transition of Care [facility]) CM/SW Contact:  Su Hilt, RN Phone Number: 05/10/2019, 2:25 PM   Clinical Narrative:     Patient discharging home, Provided the patient the medications Insulin from Med management The patient already had an [facility] application and I resent the referral to [facility] No further needs  Final next level of care: Home/Self Care Barriers to Discharge: Barriers Resolved   Patient Goals and CMS Choice        Discharge Placement                       Discharge Plan and Services     Post Acute Care Choice: NA                               Social Determinants of Health (SDOH) Interventions     Readmission Risk Interventions No flowsheet data found.

## 2019-05-10 NOTE — Telephone Encounter (Signed)
Left message x2 for pt. 

## 2019-05-10 NOTE — Discharge Summary (Signed)
[facility] -  at [facility]   PATIENT NAME: Kristine Allison    MR#:  [id number]  DATE OF BIRTH:  February 20, 1981  DATE OF ADMISSION:  05/08/2019 ADMITTING PHYSICIAN: Jama FlavorsJude Ojie, MD  DATE OF DISCHARGE: 05/10/2019  PRIMARY CARE PHYSICIAN: [facility]   ADMISSION DIAGNOSIS:  Diabetic ketoacidosis without coma associated with type 1 diabetes mellitus (HCC) [E10.10]  DISCHARGE DIAGNOSIS:  Active Problems:   DKA, type 1 (HCC)   Malnutrition of moderate degree   SECONDARY DIAGNOSIS:   Past Medical History:  Diagnosis Date  . Anxiety   . Asthma   . Diabetes mellitus without complication (HCC)   . Headache(784.0)     HOSPITAL COURSE:   1.  Diabetic ketoacidosis.  The patient was encouraged to take more of an active role in taking care of herself.  She needs to check her sugars 4 times a day and take her insulin.  I spoke with the care manager and we set her up with the medication management for her insulin supplies.  They do have Basaglar insulin which I prescribed 15 units daily and short acting Humalog prior to meals.  Both these were prescribed this pens.  The patient does have her glucometer test strips and lancets at home.  I spoke with the patient's mother yesterday about the patient taking a role in taking care of her self. 2.  Weakness 3.  Tobacco abuse 4.  Asthma.  Respiratory status stable 5.  Protein calorie malnutrition.  Continue supplementation  DISCHARGE CONDITIONS:   Fair  CONSULTS OBTAINED:  Patient was seen by critical care specialist while in the ICU  DRUG ALLERGIES:   Allergies  Allergen Reactions  . Latex Rash    DISCHARGE MEDICATIONS:   Allergies as of 05/10/2019      Reactions   Latex Rash      Medication List    STOP taking these medications   insulin NPH-regular Human (70-30) 100 UNIT/ML injection Commonly known as: NovoLIN 70/30     TAKE these medications   Basaglar KwikPen 100 UNIT/ML Sopn Inject 0.15  mLs (15 Units total) into the skin every evening.   Ensure Max Protein Liqd Take 330 mLs (11 oz total) by mouth 2 (two) times daily between meals.   insulin lispro 100 UNIT/ML KwikPen Commonly known as: HumaLOG KwikPen Inject 0.04 mLs (4 Units total) into the skin 3 (three) times daily.   ondansetron 4 MG tablet Commonly known as: ZOFRAN Take 1 tablet (4 mg total) by mouth every 6 (six) hours as needed for nausea.        DISCHARGE INSTRUCTIONS:   Follow-up [facility] the [facility] as soon as possible  If you experience worsening of your admission symptoms, develop shortness of breath, life threatening emergency, suicidal or homicidal thoughts you must seek medical attention immediately by calling 911 or calling your MD immediately  if symptoms less severe.  You Must read complete instructions/literature along with all the possible adverse reactions/side effects for all the Medicines you take and that have been prescribed to you. Take any new Medicines after you have completely understood and accept all the possible adverse reactions/side effects.   Please note  You were cared for by a hospitalist during your hospital stay. If you have any questions about your discharge medications or the care you received while you were in the hospital after you are discharged, you can call the unit and asked to speak with the hospitalist on call if the hospitalist  that took care of you is not available. Once you are discharged, your primary care physician will handle any further medical issues. Please note that NO REFILLS for any discharge medications will be authorized once you are discharged, as it is imperative that you return to your primary care physician (or establish a relationship with a primary care physician if you do not have one) for your aftercare needs so that they can reassess your need for medications and monitor your lab values.    Today   CHIEF COMPLAINT:   Chief Complaint   Patient presents with  . Hyperglycemia    HISTORY OF PRESENT ILLNESS:  Kristine Allison  is a 38 y.o. female resented back to the hospital in diabetic ketoacidosis   VITAL SIGNS:  Blood pressure 108/66, pulse 64, temperature 98.3 F (36.8 C), resp. rate 18, height 5\' 4"  (1.626 m), weight 48.6 kg, last menstrual period 10/23/2013, SpO2 98 %.   PHYSICAL EXAMINATION:  GENERAL:  38 y.o.-year-old patient lying in the bed with no acute distress.  EYES: Pupils equal, round, reactive to light and accommodation. No scleral icterus. Extraocular muscles intact.  HEENT: Head atraumatic, normocephalic. Oropharynx and nasopharynx clear.  NECK:  Supple, no jugular venous distention. No thyroid enlargement, no tenderness.  LUNGS: Normal breath sounds bilaterally, no wheezing, rales,rhonchi or crepitation. No use of accessory muscles of respiration.  CARDIOVASCULAR: S1, S2 normal. No murmurs, rubs, or gallops.  ABDOMEN: Soft, non-tender, non-distended. Bowel sounds present. No organomegaly or mass.  EXTREMITIES: No pedal edema, cyanosis, or clubbing.  NEUROLOGIC: Cranial nerves II through XII are intact. Muscle strength 5/5 in all extremities. Sensation intact. Gait not checked.  PSYCHIATRIC: The patient is alert and oriented x 3.  SKIN: No obvious rash, lesion, or ulcer.   DATA REVIEW:   CBC Recent Labs  Lab 05/09/19 0620  WBC 10.8*  HGB 12.0  HCT 35.7*  PLT 193    Chemistries  Recent Labs  Lab 05/08/19 1744  05/10/19 0439  NA 132*   < > 132*  K 5.3*   < > 3.9  CL 92*   < > 100  CO2 11*   < > 23  GLUCOSE 428*   < > 164*  BUN 26*   < > 12  CREATININE 1.30*   < > 0.51  CALCIUM 9.3   < > 8.2*  AST 19  --   --   ALT 22  --   --   ALKPHOS 101  --   --   BILITOT 1.7*  --   --    < > = values in this interval not displayed.     Microbiology Results  Results for orders placed or performed during the hospital encounter of 05/08/19  MRSA PCR Screening     Status: None    Collection Time: 05/08/19  8:38 PM   Specimen: Nasal Mucosa; Nasopharyngeal  Result Value Ref Range Status   MRSA by PCR NEGATIVE NEGATIVE Final    Comment:        The GeneXpert MRSA Assay (FDA approved for NASAL specimens only), is one component of a comprehensive MRSA colonization surveillance program. It is not intended to diagnose MRSA infection nor to guide or monitor treatment for MRSA infections. Performed at [facility], [address] [place], [place] [address]       Management plans discussed with the patient, and she is in agreement.  Spoke with the patient's mother yesterday.  Unable to reach her  on the phone today.  CODE STATUS:     Code Status Orders  (From admission, onward)         Start     Ordered   05/08/19 1921  Full code  Continuous     05/08/19 1920        Code Status History    Date Active Date Inactive Code Status Order ID Comments User Context   05/06/2019 0328 05/07/2019 1922 Full Code [id number]  Mayer Camel, NP ED   01/13/2019 1912 01/18/2019 1645 Full Code [id number]  Hillary Bow, MD ED   08/07/2018 2251 08/09/2018 2113 Full Code [id number]  Lance Coon, MD ED   03/25/2018 1136 03/26/2018 1708 Full Code [id number]  Cletis Athens, RN Inpatient   03/22/2017 2140 03/24/2017 1842 Full Code [id number]  Gladstone Lighter, MD Inpatient   10/02/2015 1355 10/03/2015 1830 Full Code [id number]  Vaughan Basta, MD Inpatient   09/25/2015 0323 09/25/2015 2020 Full Code [id number]  Rosemarie Ax, MD ED   07/25/2015 1733 07/27/2015 1525 Full Code [id number]  Domenic Polite, MD Inpatient   05/03/2015 1522 05/05/2015 1634 Full Code [id number]  Bethena Roys, MD ED   02/09/2015 2256 02/11/2015 1641 Full Code [id number]  Janora Norlander, DO Inpatient   12/21/2014 0843 12/23/2014 2053 Full Code [id number]  Marijean Heath, NP ED   08/20/2014 0543 08/22/2014 1903 Full Code [id number]  Lance Coon, MD Inpatient   12/02/2013 0210 12/03/2013 1700  Full Code [id number]  Hilton Sinclair, MD Inpatient   11/15/2013 1532 11/20/2013 1631 Full Code [id number]  Donita Brooks, NP ED   07/26/2013 2259 07/29/2013 1830 Full Code [id number]  Melony Overly, MD Inpatient   06/05/2013 2338 06/07/2013 1822 Full Code [id number]  Leone Haven, MD Inpatient   06/05/2013 2338 06/05/2013 2338 Full Code [id number]  Leone Haven, MD Inpatient   Advance Care Planning Activity      TOTAL TIME TAKING CARE OF THIS PATIENT: 35 minutes.    Loletha Grayer M.D on 05/10/2019 at 2:34 PM  Between 7am to 6pm - Pager - [phone]  After 6pm go to [email or web address] - password [email or web address]  [employer] [facility] Office  [phone]  CC: Primary care physician; [facility]

## 2019-05-10 NOTE — Discharge Instructions (Signed)
Diabetic Ketoacidosis °Diabetic ketoacidosis is a serious complication of diabetes. This condition develops when there is not enough insulin in the body. Insulin is an hormone that regulates blood sugar levels in the body. Normally, insulin allows glucose to enter the cells in the body. The cells break down glucose for energy. Without enough insulin, the body cannot break down glucose, so it breaks down fats instead. This leads to high blood glucose levels in the body and the production of acids that are called ketones. Ketones are poisonous at high levels. °If diabetic ketoacidosis is not treated, it can cause severe dehydration and can lead to a coma or death. °What are the causes? °This condition develops when a lack of insulin causes the body to break down fats instead of glucose. This may be triggered by: °· Stress on the body. This stress is brought on by an illness. °· Infection. °· Medicines that raise blood glucose levels. °· Not taking diabetes medicine. °· New onset of type 1 diabetes mellitus. °What are the signs or symptoms? °Symptoms of this condition include: °· Fatigue. °· Weight loss. °· Excessive thirst. °· Light-headedness. °· Fruity or sweet-smelling breath. °· Excessive urination. °· Vision changes. °· Confusion or irritability. °· Nausea. °· Vomiting. °· Rapid breathing. °· Abdominal pain. °· Feeling flushed. °How is this diagnosed? °This condition is diagnosed based on your medical history, a physical exam, and blood tests. You may also have a urine test to check for ketones. °How is this treated? °This condition may be treated with: °· Fluid replacement. This may be done to correct dehydration. °· Insulin injections. These may be given through the skin or through an IV tube. °· Electrolyte replacement. Electrolytes are minerals in your blood. Electrolytes such as potassium and sodium may be given in pill form or through an IV tube. °· Antibiotic medicines. These may be prescribed if your  condition was caused by an infection. °Diabetic ketoacidosis is a serious medical condition. You may need emergency treatment in the hospital to monitor your condition. °Follow these instructions at home: °Eating and drinking °· Drink enough fluids to keep your urine clear or pale yellow. °· If you are not able to eat, drink clear fluids in small amounts as you are able. Clear fluids include water, ice chips, fruit juice with water added (diluted), and low-calorie sports drinks. You may also have sugar-free jello or popsicles. °· If you are able to eat, follow your usual diet and drink sugar-free liquids, such as water. °Medicines °· Take over-the-counter and prescription medicines only as told by your health care provider. °· Continue to take insulin and other diabetes medicines as told by your health care provider. °· If you were prescribed an antibiotic, take it as told by your health care provider. Do not stop taking the antibiotic even if you start to feel better. °General instructions ° °· Check your urine for ketones when you are ill and as told by your health care provider. °? If your blood glucose is 240 mg/dL (13.3 mmol/L) or higher, check your urine ketones every 4-6 hours. °· Check your blood glucose every day, as often as told by your health care provider. °? If your blood glucose is high, drink plenty of fluids. This helps to flush out ketones. °? If your blood glucose is above your target for 2 tests in a row, contact your health care provider. °· Carry a medical alert card or wear medical alert jewelry that says that you have diabetes. °· Rest   and exercise only as told by your health care provider. Do not exercise when your blood glucose is high and you have ketones in your urine. °· If you get sick, call your health care provider and begin treatment quickly. Your body often needs extra insulin to fight an illness. Check your blood glucose every 4-6 hours when you are sick. °· Keep all follow-up  visits as told by your health care provider. This is important. °Contact a health care provider if: °· Your blood glucose level is higher than 240 mg/dL (13.3 mmol/L) for 2 days in a row. °· You have moderate or large ketones in your urine. °· You have a fever. °· You cannot eat or drink without vomiting. °· You have been vomiting for more than 2 hours. °· You continue to have symptoms of diabetic ketoacidosis. °· You develop new symptoms. °Get help right away if: °· Your blood glucose monitor reads “high” even when you are taking insulin. °· You faint. °· You have chest pain. °· You have trouble breathing. °· You have sudden trouble speaking or swallowing. °· You have vomiting or diarrhea that gets worse after 3 hours. °· You are unable to stay awake. °· You have trouble thinking. °· You are severely dehydrated. Symptoms of severe dehydration include: °? Extreme thirst. °? Dry mouth. °? Rapid breathing. °These symptoms may represent a serious problem that is an emergency. Do not wait to see if the symptoms will go away. Get medical help right away. Call your local emergency services (911 in the [place] Do not drive yourself to the hospital. °Summary °· Diabetic ketoacidosis is a serious complication of diabetes. This condition develops when there is not enough insulin in the body. °· This condition is diagnosed based on your medical history, a physical exam, and blood tests. You may also have a urine test to check for ketones. °· Diabetic ketoacidosis is a serious medical condition. You may need emergency treatment in the hospital to monitor your condition. °· Contact your health care provider if your blood glucose is higher than 240 mg/dl for 2 days in a row or if you have moderate or large ketones in your urine. °This information is not intended to replace advice given to you by your health care provider. Make sure you discuss any questions you have with your health care provider. °Document Released: 09/05/2000  Document Revised: 10/24/2016 Document Reviewed: 10/13/2016 °Elsevier Patient Education © 2020 [employer] ° °

## 2019-05-10 NOTE — Progress Notes (Signed)
Patient ID: Kristine Allison    Patient admitted to [facility] on 05/08/2019.  Discharged 05/10/2019.  May return to work on 05/12/2019 without restrictions.  Dr Loletha Grayer [phone]

## 2019-05-10 NOTE — TOC Progression Note (Signed)
Transition of Care [facility]) - Progression Note    Patient Details  Name: Kristine Allison MRN: [id number] Date of Birth: 11-27-1980  [facility]) CM/SW Contact  Su Hilt, RN Phone Number: 05/10/2019, 9:44 AM  Clinical Narrative:     Called Medication Mgt and requested inforamtion on which short and long acting insulins they are able to help with, She stated that they have the Basaglar pen and humalog pen they can help with  Expected Discharge Plan: Home/Self Care Barriers to Discharge: Continued Medical Work up  Expected Discharge Plan and Services Expected Discharge Plan: Home/Self Care     Post Acute Care Choice: NA Living arrangements for the past 2 months: Single Family Home                                       Social Determinants of Health (SDOH) Interventions    Readmission Risk Interventions No flowsheet data found.

## 2019-05-11 NOTE — Telephone Encounter (Signed)
ATC pt without success. LMOM for pt to return call. Will try again later.

## 2019-05-13 NOTE — Telephone Encounter (Signed)
Left message for pt. Letter has been mailed to address on file.  Will close encounter per office protocol.

## 2019-07-25 ENCOUNTER — Other Ambulatory Visit: Payer: Self-pay | Admitting: *Deleted

## 2019-07-25 DIAGNOSIS — Z20822 Contact with and (suspected) exposure to covid-19: Secondary | ICD-10-CM

## 2019-07-27 ENCOUNTER — Emergency Department: Payer: Self-pay

## 2019-07-27 ENCOUNTER — Other Ambulatory Visit: Payer: Self-pay

## 2019-07-27 ENCOUNTER — Inpatient Hospital Stay
Admission: EM | Admit: 2019-07-27 | Discharge: 2019-08-01 | DRG: 208 | Disposition: A | Discharge status: Home / Self Care | Payer: Self-pay | Attending: Internal Medicine | Admitting: Internal Medicine

## 2019-07-27 DIAGNOSIS — R4182 Altered mental status, unspecified: Secondary | ICD-10-CM

## 2019-07-27 DIAGNOSIS — Z978 Presence of other specified devices: Secondary | ICD-10-CM

## 2019-07-27 DIAGNOSIS — I252 Old myocardial infarction: Secondary | ICD-10-CM

## 2019-07-27 DIAGNOSIS — E1110 Type 2 diabetes mellitus with ketoacidosis without coma: Secondary | ICD-10-CM | POA: Diagnosis present

## 2019-07-27 DIAGNOSIS — J9601 Acute respiratory failure with hypoxia: Principal | ICD-10-CM | POA: Diagnosis present

## 2019-07-27 DIAGNOSIS — E871 Hypo-osmolality and hyponatremia: Secondary | ICD-10-CM | POA: Diagnosis present

## 2019-07-27 DIAGNOSIS — J45901 Unspecified asthma with (acute) exacerbation: Secondary | ICD-10-CM | POA: Diagnosis present

## 2019-07-27 DIAGNOSIS — E869 Volume depletion, unspecified: Secondary | ICD-10-CM | POA: Diagnosis present

## 2019-07-27 DIAGNOSIS — Z6823 Body mass index (BMI) 23.0-23.9, adult: Secondary | ICD-10-CM

## 2019-07-27 DIAGNOSIS — E1011 Type 1 diabetes mellitus with ketoacidosis with coma: Secondary | ICD-10-CM

## 2019-07-27 DIAGNOSIS — Z794 Long term (current) use of insulin: Secondary | ICD-10-CM

## 2019-07-27 DIAGNOSIS — G9341 Metabolic encephalopathy: Secondary | ICD-10-CM | POA: Diagnosis present

## 2019-07-27 DIAGNOSIS — E8729 Other acidosis: Secondary | ICD-10-CM | POA: Insufficient documentation

## 2019-07-27 DIAGNOSIS — Z91040 Latex allergy status: Secondary | ICD-10-CM

## 2019-07-27 DIAGNOSIS — N179 Acute kidney failure, unspecified: Secondary | ICD-10-CM

## 2019-07-27 DIAGNOSIS — Z79899 Other long term (current) drug therapy: Secondary | ICD-10-CM

## 2019-07-27 DIAGNOSIS — E878 Other disorders of electrolyte and fluid balance, not elsewhere classified: Secondary | ICD-10-CM | POA: Diagnosis present

## 2019-07-27 DIAGNOSIS — E872 Acidosis: Secondary | ICD-10-CM

## 2019-07-27 DIAGNOSIS — E1010 Type 1 diabetes mellitus with ketoacidosis without coma: Secondary | ICD-10-CM | POA: Diagnosis present

## 2019-07-27 DIAGNOSIS — Z20828 Contact with and (suspected) exposure to other viral communicable diseases: Secondary | ICD-10-CM | POA: Diagnosis present

## 2019-07-27 DIAGNOSIS — J690 Pneumonitis due to inhalation of food and vomit: Secondary | ICD-10-CM | POA: Diagnosis present

## 2019-07-27 DIAGNOSIS — E875 Hyperkalemia: Secondary | ICD-10-CM

## 2019-07-27 DIAGNOSIS — J9602 Acute respiratory failure with hypercapnia: Secondary | ICD-10-CM | POA: Insufficient documentation

## 2019-07-27 DIAGNOSIS — F17210 Nicotine dependence, cigarettes, uncomplicated: Secondary | ICD-10-CM | POA: Diagnosis present

## 2019-07-27 DIAGNOSIS — I959 Hypotension, unspecified: Secondary | ICD-10-CM | POA: Diagnosis not present

## 2019-07-27 DIAGNOSIS — E440 Moderate protein-calorie malnutrition: Secondary | ICD-10-CM | POA: Diagnosis present

## 2019-07-27 LAB — CBC WITH DIFFERENTIAL/PLATELET
Abs Immature Granulocytes: 0.05 10*3/uL (ref 0.00–0.07)
Basophils Absolute: 0 10*3/uL (ref 0.0–0.1)
Basophils Relative: 0 %
Eosinophils Absolute: 0 10*3/uL (ref 0.0–0.5)
Eosinophils Relative: 0 %
HCT: 43.7 % (ref 36.0–46.0)
Hemoglobin: 13.5 g/dL (ref 12.0–15.0)
Immature Granulocytes: 1 %
Lymphocytes Relative: 9 %
Lymphs Abs: 0.7 10*3/uL (ref 0.7–4.0)
MCH: 31.9 pg (ref 26.0–34.0)
MCHC: 30.9 g/dL (ref 30.0–36.0)
MCV: 103.3 fL — ABNORMAL HIGH (ref 80.0–100.0)
Monocytes Absolute: 1.3 10*3/uL — ABNORMAL HIGH (ref 0.1–1.0)
Monocytes Relative: 17 %
Neutro Abs: 5.5 10*3/uL (ref 1.7–7.7)
Neutrophils Relative %: 73 %
Platelets: 272 10*3/uL (ref 150–400)
RBC: 4.23 MIL/uL (ref 3.87–5.11)
RDW: 13 % (ref 11.5–15.5)
WBC: 7.6 10*3/uL (ref 4.0–10.5)
nRBC: 0 % (ref 0.0–0.2)

## 2019-07-27 LAB — BASIC METABOLIC PANEL
Anion gap: 12 (ref 5–15)
BUN: 60 mg/dL — ABNORMAL HIGH (ref 6–20)
CO2: 23 mmol/L (ref 22–32)
Calcium: 9.5 mg/dL (ref 8.9–10.3)
Chloride: 103 mmol/L (ref 98–111)
Creatinine, Ser: 1.52 mg/dL — ABNORMAL HIGH (ref 0.44–1.00)
GFR calc Af Amer: 51 mL/min — ABNORMAL LOW (ref >60)
GFR calc non Af Amer: 44 mL/min — ABNORMAL LOW (ref >60)
Glucose, Bld: 492 mg/dL — ABNORMAL HIGH (ref 70–99)
Potassium: 4.6 mmol/L (ref 3.5–5.1)
Sodium: 138 mmol/L (ref 135–145)

## 2019-07-27 LAB — GLUCOSE, CAPILLARY
Glucose-Capillary: >600 mg/dL (ref 70–99)
Glucose-Capillary: >600 mg/dL (ref 70–99)
Glucose-Capillary: >600 mg/dL (ref 70–99)
Glucose-Capillary: 528 mg/dL (ref 70–99)
Glucose-Capillary: 483 mg/dL — ABNORMAL HIGH (ref 70–99)
Glucose-Capillary: 382 mg/dL — ABNORMAL HIGH (ref 70–99)
Glucose-Capillary: 388 mg/dL — ABNORMAL HIGH (ref 70–99)

## 2019-07-27 LAB — COMPREHENSIVE METABOLIC PANEL
ALT: 37 U/L (ref 0–44)
AST: 31 U/L (ref 15–41)
Albumin: 4.2 g/dL (ref 3.5–5.0)
Alkaline Phosphatase: 107 U/L (ref 38–126)
Anion gap: 26 — ABNORMAL HIGH (ref 5–15)
BUN: 69 mg/dL — ABNORMAL HIGH (ref 6–20)
CO2: 15 mmol/L — ABNORMAL LOW (ref 22–32)
Calcium: 9.2 mg/dL (ref 8.9–10.3)
Chloride: 91 mmol/L — ABNORMAL LOW (ref 98–111)
Creatinine, Ser: 1.77 mg/dL — ABNORMAL HIGH (ref 0.44–1.00)
GFR calc Af Amer: 42 mL/min — ABNORMAL LOW (ref >60)
GFR calc non Af Amer: 36 mL/min — ABNORMAL LOW (ref >60)
Glucose, Bld: 798 mg/dL (ref 70–99)
Potassium: 6.9 mmol/L (ref 3.5–5.1)
Sodium: 132 mmol/L — ABNORMAL LOW (ref 135–145)
Total Bilirubin: 2.3 mg/dL — ABNORMAL HIGH (ref 0.3–1.2)
Total Protein: 7.5 g/dL (ref 6.5–8.1)

## 2019-07-27 LAB — URINALYSIS, ROUTINE W REFLEX MICROSCOPIC
Bacteria, UA: NONE SEEN
Bilirubin Urine: NEGATIVE
Glucose, UA: >=500 mg/dL — AB
Hgb urine dipstick: NEGATIVE
Ketones, ur: 20 mg/dL — AB
Leukocytes,Ua: NEGATIVE
Nitrite: NEGATIVE
Protein, ur: NEGATIVE mg/dL
Specific Gravity, Urine: 1.017 (ref 1.005–1.030)
pH: 5 (ref 5.0–8.0)

## 2019-07-27 LAB — URINE DRUG SCREEN, QUALITATIVE (ARMC ONLY)
Amphetamines, Ur Screen: NOT DETECTED
Barbiturates, Ur Screen: NOT DETECTED
Benzodiazepine, Ur Scrn: NOT DETECTED
Cannabinoid 50 Ng, Ur ~~LOC~~: NOT DETECTED
Cocaine Metabolite,Ur ~~LOC~~: NOT DETECTED
MDMA (Ecstasy)Ur Screen: NOT DETECTED
Methadone Scn, Ur: NOT DETECTED
Opiate, Ur Screen: NOT DETECTED
Phencyclidine (PCP) Ur S: NOT DETECTED
Tricyclic, Ur Screen: NOT DETECTED

## 2019-07-27 LAB — BLOOD GAS, ARTERIAL
Acid-base deficit: 14.8 mmol/L — ABNORMAL HIGH (ref 0.0–2.0)
Acid-base deficit: 10.9 mmol/L — ABNORMAL HIGH (ref 0.0–2.0)
Acid-base deficit: 0.8 mmol/L (ref 0.0–2.0)
Bicarbonate: 17.9 mmol/L — ABNORMAL LOW (ref 20.0–28.0)
Bicarbonate: 18.4 mmol/L — ABNORMAL LOW (ref 20.0–28.0)
Bicarbonate: 26.2 mmol/L (ref 20.0–28.0)
FIO2: 0.4
FIO2: 0.4
FIO2: 0.5
MECHVT: 430 mL
MECHVT: 430 mL
MECHVT: 430 mL
Mechanical Rate: 20
Mechanical Rate: 20
O2 Saturation: 96.5 %
O2 Saturation: 87.8 %
O2 Saturation: 92.6 %
PEEP: 5 cmH2O
PEEP: 5 cmH2O
PEEP: 5 cmH2O
Patient temperature: 37
Patient temperature: 37
Patient temperature: 37
RATE: 24 resp/min
pCO2 arterial: 76 mmHg (ref 32.0–48.0)
pCO2 arterial: 54 mmHg — ABNORMAL HIGH (ref 32.0–48.0)
pCO2 arterial: 52 mmHg — ABNORMAL HIGH (ref 32.0–48.0)
pH, Arterial: 6.98 — CL (ref 7.350–7.450)
pH, Arterial: 7.14 — CL (ref 7.350–7.450)
pH, Arterial: 7.31 — ABNORMAL LOW (ref 7.350–7.450)
pO2, Arterial: 126 mmHg — ABNORMAL HIGH (ref 83.0–108.0)
pO2, Arterial: 71 mmHg — ABNORMAL LOW (ref 83.0–108.0)
pO2, Arterial: 72 mmHg — ABNORMAL LOW (ref 83.0–108.0)

## 2019-07-27 LAB — LACTIC ACID, PLASMA
Lactic Acid, Venous: 2.4 mmol/L (ref 0.5–1.9)
Lactic Acid, Venous: 2.4 mmol/L (ref 0.5–1.9)

## 2019-07-27 LAB — BLOOD GAS, VENOUS
Acid-base deficit: 15.3 mmol/L — ABNORMAL HIGH (ref 0.0–2.0)
Bicarbonate: 14.8 mmol/L — ABNORMAL LOW (ref 20.0–28.0)
O2 Saturation: 86.2 %
Patient temperature: 37
pCO2, Ven: 51 mmHg (ref 44.0–60.0)
pH, Ven: 7.07 — CL (ref 7.250–7.430)
pO2, Ven: 73 mmHg — ABNORMAL HIGH (ref 32.0–45.0)

## 2019-07-27 LAB — PROTIME-INR
INR: 1.2 (ref 0.8–1.2)
Prothrombin Time: 14.8 seconds (ref 11.4–15.2)

## 2019-07-27 LAB — PHOSPHORUS: Phosphorus: 7.4 mg/dL — ABNORMAL HIGH (ref 2.5–4.6)

## 2019-07-27 LAB — NOVEL CORONAVIRUS, NAA: SARS-CoV-2, NAA: NOT DETECTED

## 2019-07-27 LAB — APTT: aPTT: 27 seconds (ref 24–36)

## 2019-07-27 LAB — MRSA PCR SCREENING: MRSA by PCR: NEGATIVE

## 2019-07-27 LAB — MAGNESIUM: Magnesium: 3.1 mg/dL — ABNORMAL HIGH (ref 1.7–2.4)

## 2019-07-27 LAB — SALICYLATE LEVEL: Salicylate Lvl: <7 mg/dL (ref 2.8–30.0)

## 2019-07-27 LAB — ACETAMINOPHEN LEVEL: Acetaminophen (Tylenol), Serum: <10 ug/mL — ABNORMAL LOW (ref 10–30)

## 2019-07-27 LAB — SARS CORONAVIRUS 2 BY RT PCR (HOSPITAL ORDER, PERFORMED IN ~~LOC~~ HOSPITAL LAB): SARS Coronavirus 2: NEGATIVE

## 2019-07-27 LAB — ETHANOL: Alcohol, Ethyl (B): <10 mg/dL (ref <10)

## 2019-07-27 LAB — BETA-HYDROXYBUTYRIC ACID: Beta-Hydroxybutyric Acid: >8 mmol/L — ABNORMAL HIGH (ref 0.05–0.27)

## 2019-07-27 LAB — PROCALCITONIN: Procalcitonin: 4.15 ng/mL

## 2019-07-27 LAB — PREGNANCY, URINE: Preg Test, Ur: NEGATIVE

## 2019-07-27 MED ORDER — FENTANYL CITRATE (PF) 100 MCG/2ML IJ SOLN: 100.0000 ug | INTRAMUSCULAR | Status: DC | PRN

## 2019-07-27 MED ORDER — INSULIN REGULAR BOLUS VIA INFUSION
0.0000 [IU] | Freq: Three times a day (TID) | INTRAVENOUS | Status: DC
  Filled 2019-07-27: qty 10

## 2019-07-27 MED ORDER — SODIUM CHLORIDE 0.9 % IV SOLN
3.0000 g | Freq: Four times a day (QID) | INTRAVENOUS | Status: DC
  Administered 2019-07-27 – 2019-08-01 (×19): 3 g via INTRAVENOUS
  Filled 2019-07-27 (×2): qty 3
  Filled 2019-07-27: qty 8
  Filled 2019-07-27 (×10): qty 3
  Filled 2019-07-27: qty 8
  Filled 2019-07-27 (×7): qty 3

## 2019-07-27 MED ORDER — DEXTROSE 50 % IV SOLN: 25.0000 mL | INTRAVENOUS | Status: DC | PRN

## 2019-07-27 MED ORDER — DEXTROSE-NACL 5-0.45 % IV SOLN
INTRAVENOUS | Status: DC
  Administered 2019-07-28: 02:00:00 via INTRAVENOUS

## 2019-07-27 MED ORDER — FENTANYL CITRATE (PF) 100 MCG/2ML IJ SOLN
25.0000 ug | INTRAMUSCULAR | Status: DC | PRN
  Administered 2019-07-28 (×2): 50 ug via INTRAVENOUS
  Administered 2019-07-29: 25 ug via INTRAVENOUS
  Administered 2019-07-29: 100 ug via INTRAVENOUS
  Filled 2019-07-27 (×4): qty 2

## 2019-07-27 MED ORDER — SODIUM BICARBONATE 8.4 % IV SOLN
50.0000 meq | Freq: Once | INTRAVENOUS | Status: AC
  Administered 2019-07-27: 50 meq via INTRAVENOUS

## 2019-07-27 MED ORDER — CALCIUM GLUCONATE 10 % IV SOLN
1.0000 g | Freq: Once | INTRAVENOUS | Status: AC
  Administered 2019-07-27: 1 g via INTRAVENOUS

## 2019-07-27 MED ORDER — LACTATED RINGERS IV BOLUS
1000.0000 mL | Freq: Once | INTRAVENOUS | Status: AC
  Administered 2019-07-27: 16:00:00 1000 mL via INTRAVENOUS

## 2019-07-27 MED ORDER — NOREPINEPHRINE 4 MG/250ML-% IV SOLN
0.0000 ug/min | INTRAVENOUS | Status: DC
  Administered 2019-07-27: 2 ug/min via INTRAVENOUS

## 2019-07-27 MED ORDER — FENTANYL CITRATE (PF) 100 MCG/2ML IJ SOLN
100.0000 ug | INTRAMUSCULAR | Status: DC | PRN
  Administered 2019-07-27: 25 ug via INTRAVENOUS
  Filled 2019-07-27: qty 2

## 2019-07-27 MED ORDER — PANTOPRAZOLE SODIUM 40 MG IV SOLR
40.0000 mg | INTRAVENOUS | Status: DC
  Administered 2019-07-27 – 2019-07-28 (×2): 40 mg via INTRAVENOUS
  Filled 2019-07-27 (×2): qty 40

## 2019-07-27 MED ORDER — DEXMEDETOMIDINE HCL IN NACL 400 MCG/100ML IV SOLN
0.0000 ug/kg/h | INTRAVENOUS | Status: DC
  Administered 2019-07-27: 0.4 ug/kg/h via INTRAVENOUS
  Filled 2019-07-27: qty 100

## 2019-07-27 MED ORDER — PROPOFOL 1000 MG/100ML IV EMUL
0.0000 ug/kg/min | INTRAVENOUS | Status: DC
  Administered 2019-07-27: 20 ug/kg/min via INTRAVENOUS
  Administered 2019-07-28: 06:00:00 35 ug/kg/min via INTRAVENOUS
  Administered 2019-07-28 (×2): 45 ug/kg/min via INTRAVENOUS
  Administered 2019-07-28 – 2019-07-29 (×2): 40 ug/kg/min via INTRAVENOUS
  Administered 2019-07-29: 45 ug/kg/min via INTRAVENOUS
  Filled 2019-07-27 (×7): qty 100

## 2019-07-27 MED ORDER — LACTATED RINGERS IV BOLUS
1000.0000 mL | Freq: Once | INTRAVENOUS | Status: AC
  Administered 2019-07-27: 1000 mL via INTRAVENOUS

## 2019-07-27 MED ORDER — ROCURONIUM BROMIDE 50 MG/5ML IV SOLN
70.0000 mg | Freq: Once | INTRAVENOUS | Status: AC
  Administered 2019-07-27: 16:00:00 70 mg via INTRAVENOUS
  Filled 2019-07-27: qty 7

## 2019-07-27 MED ORDER — CHLORHEXIDINE GLUCONATE 0.12% ORAL RINSE (MEDLINE KIT)
15.0000 mL | Freq: Two times a day (BID) | OROMUCOSAL | Status: DC
  Administered 2019-07-27 – 2019-07-29 (×4): 15 mL via OROMUCOSAL

## 2019-07-27 MED ORDER — ENOXAPARIN SODIUM 40 MG/0.4ML ~~LOC~~ SOLN
40.0000 mg | SUBCUTANEOUS | Status: DC
  Administered 2019-07-27 – 2019-07-31 (×5): 40 mg via SUBCUTANEOUS
  Filled 2019-07-27 (×7): qty 0.4

## 2019-07-27 MED ORDER — STERILE WATER FOR INJECTION IV SOLN
INTRAVENOUS | Status: DC
  Filled 2019-07-27 (×3): qty 850

## 2019-07-27 MED ORDER — SODIUM CHLORIDE 0.9 % IV BOLUS
1000.0000 mL | Freq: Once | INTRAVENOUS | Status: AC
  Administered 2019-07-27: 1000 mL via INTRAVENOUS

## 2019-07-27 MED ORDER — INSULIN REGULAR(HUMAN) IN NACL 100-0.9 UT/100ML-% IV SOLN
INTRAVENOUS | Status: DC
  Administered 2019-07-27: 16:00:00 5.4 [IU]/h via INTRAVENOUS
  Filled 2019-07-27: qty 100

## 2019-07-27 MED ORDER — ORAL CARE MOUTH RINSE
15.0000 mL | OROMUCOSAL | Status: DC
  Administered 2019-07-27 – 2019-07-29 (×16): 15 mL via OROMUCOSAL

## 2019-07-27 MED ORDER — NOREPINEPHRINE 4 MG/250ML-% IV SOLN: 0.0000 ug/min | INTRAVENOUS | Status: DC

## 2019-07-27 MED ORDER — CHLORHEXIDINE GLUCONATE CLOTH 2 % EX PADS
6.0000 | MEDICATED_PAD | Freq: Every day | CUTANEOUS | Status: DC
  Administered 2019-07-27 – 2019-07-30 (×2): 6 via TOPICAL

## 2019-07-27 MED ORDER — SODIUM CHLORIDE 0.45 % IV SOLN
INTRAVENOUS | Status: DC
  Administered 2019-07-27: 1000 mL via INTRAVENOUS

## 2019-07-27 MED ORDER — ETOMIDATE 2 MG/ML IV SOLN
15.0000 mg | Freq: Once | INTRAVENOUS | Status: AC
  Administered 2019-07-27: 15 mg via INTRAVENOUS

## 2019-07-27 NOTE — ED Notes (Signed)
Green top sent to lab

## 2019-07-27 NOTE — ED Notes (Signed)
Pt sats are 89% again. MD notified and to the bedside. Respiratory called. MD suctioned pt and increased FI02 from 40 to 60.

## 2019-07-27 NOTE — H&P (Signed)
History and Physical    Shalane Florendo XTG:[id number] DOB: 07/14/1981 DOA: 07/27/2019  PCP: Patient, No Pcp Per  Patient coming from: Home  I have personally briefly reviewed patient's old medical records in [facility]  Chief Complaint: AMS  HPI: Navayah Sok Shavona Gunderman is a 38 y.o. female with medical history significant of Type 1 diabetes with recurrent hospitalization for DKA, asthma who reportedly started to feel unwell about 3 days ago with cough and had outpatient COVID test 2 days ago which returned negative.    ED Course: Patient presented with altered mental status and was unable to provide any history. She was found to be tachypnic and tachycardic requiring oxygen via nasal cannula but ultimately needed mechanical ventilation secondary to respiratory failure. PH of 6.98, Bicarb of 18, Anion gap of 26 and glucose of 798.She required Levophed peri-intubation but was able to wean prior to admission. She was given an amp of Bicarb, started on insulin drip, and Precedex.   Review of Systems: Unable to obtain due to patient being intubated and sedated.  Past Medical History:  Diagnosis Date  . Anxiety   . Asthma   . Diabetes mellitus without complication (HCC)   . [id number](381.8)     Past Surgical History:  Procedure Laterality Date  . NO PAST SURGERIES       reports that she has been smoking cigarettes. She has a 5.00 pack-year smoking history. She has never used smokeless tobacco. She reports that she does not drink alcohol or use drugs.  Allergies  Allergen Reactions  . Latex Rash    Family History  Problem Relation Age of Onset  . Heart disease Other        No family history   : Family history reviewed and not pertinent   Prior to Admission medications   Medication Sig Start Date End Date Taking? Authorizing Provider  Ensure Max Protein (ENSURE MAX PROTEIN) LIQD Take 330 mLs (11 oz total) by mouth 2 (two) times daily between meals. 05/10/19    Alford Highland, MD  Insulin Glargine (BASAGLAR KWIKPEN) 100 UNIT/ML SOPN Inject 0.15 mLs (15 Units total) into the skin every evening. 05/10/19   Alford Highland, MD  insulin lispro (HUMALOG KWIKPEN) 100 UNIT/ML KwikPen Inject 0.04 mLs (4 Units total) into the skin 3 (three) times daily. 05/10/19   Alford Highland, MD  ondansetron (ZOFRAN) 4 MG tablet Take 1 tablet (4 mg total) by mouth every 6 (six) hours as needed for nausea. 01/18/19   Altamese Dilling, MD    Physical Exam: Vitals:   07/27/19 1648 07/27/19 1657 07/27/19 1700 07/27/19 1706  BP: 110/61 111/62 112/64 114/67  Pulse: (!) 136 (!) 136 (!) 136 (!) 135  Resp: 20 20 20 20   Temp:      TempSrc:      SpO2: 97% 97% 97% 97%  Weight:      Height:        Constitutional: Thin ill-appearing young female intubated and sedated Vitals:   07/27/19 1648 07/27/19 1657 07/27/19 1700 07/27/19 1706  BP: 110/61 111/62 112/64 114/67  Pulse: (!) 136 (!) 136 (!) 136 (!) 135  Resp: 20 20 20 20   Temp:      TempSrc:      SpO2: 97% 97% 97% 97%  Weight:      Height:       Eyes: PERRL, lids and conjunctivae normal ENMT: Mucous membranes are dry. Neck: normal, supple, no masses Respiratory: intubated on 40%, 5 PEEP  with crackles anteriorly Cardiovascular: Tachycardic with normal rhythm, no murmurs / rubs / gallops. No extremity edema. 2+ pedal pulses Abdomen: no tenderness, no masses palpated.  Bowel sounds positive.  Musculoskeletal: no clubbing / cyanosis. No joint deformity upper and lower extremities. Good ROM, no contractures. Normal muscle tone.  Skin: no rashes, lesions, ulcers. No induration.  Bruising of the left anterior patella. Neurologic: Sedated with grimace on noxious stimuli.  Psychiatric: Unable to assess due to sedation and intubation    Labs on Admission: I have personally reviewed following labs and imaging studies  CBC: Recent Labs  Lab 07/27/19 1446  WBC 7.6  NEUTROABS 5.5  HGB 13.5  HCT 43.7  MCV  103.3*  PLT 272   Basic Metabolic Panel: Recent Labs  Lab 07/27/19 1446 07/27/19 1515  NA 132*  --   K 6.9*  --   CL 91*  --   CO2 15*  --   GLUCOSE 798*  --   BUN 69*  --   CREATININE 1.77*  --   CALCIUM 9.2  --   MG  --  3.1*  PHOS  --  7.4*   GFR: Estimated Creatinine Clearance: 37.9 mL/min (A) (by C-G formula based on SCr of 1.77 mg/dL (H)). Liver Function Tests: Recent Labs  Lab 07/27/19 1446  AST 31  ALT 37  ALKPHOS 107  BILITOT 2.3*  PROT 7.5  ALBUMIN 4.2   No results for input(s): LIPASE, AMYLASE in the last 168 hours. No results for input(s): AMMONIA in the last 168 hours. Coagulation Profile: Recent Labs  Lab 07/27/19 1446  INR 1.2   Cardiac Enzymes: No results for input(s): CKTOTAL, CKMB, CKMBINDEX, TROPONINI in the last 168 hours. BNP (last 3 results) No results for input(s): PROBNP in the last 8760 hours. HbA1C: No results for input(s): HGBA1C in the last 72 hours. CBG: Recent Labs  Lab 07/27/19 1446 07/27/19 1619 07/27/19 1724  GLUCAP >600* >600* >600*   Lipid Profile: No results for input(s): CHOL, HDL, LDLCALC, TRIG, CHOLHDL, LDLDIRECT in the last 72 hours. Thyroid Function Tests: No results for input(s): TSH, T4TOTAL, FREET4, T3FREE, THYROIDAB in the last 72 hours. Anemia Panel: No results for input(s): VITAMINB12, FOLATE, FERRITIN, TIBC, IRON, RETICCTPCT in the last 72 hours. Urine analysis:    Component Value Date/Time   COLORURINE STRAW (A) 07/27/2019 1446   APPEARANCEUR HAZY (A) 07/27/2019 1446   LABSPEC 1.017 07/27/2019 1446   PHURINE 5.0 07/27/2019 1446   GLUCOSEU >=500 (A) 07/27/2019 1446   HGBUR NEGATIVE 07/27/2019 1446   BILIRUBINUR NEGATIVE 07/27/2019 1446   KETONESUR 20 (A) 07/27/2019 1446   PROTEINUR NEGATIVE 07/27/2019 1446   UROBILINOGEN 0.2 07/25/2015 1418   NITRITE NEGATIVE 07/27/2019 1446   LEUKOCYTESUR NEGATIVE 07/27/2019 1446    Radiological Exams on Admission: Dg Abdomen 1 View  Result Date: 07/27/2019  CLINICAL DATA:  OG placement EXAM: ABDOMEN - 1 VIEW COMPARISON:  Chest radiograph 07/27/2019 FINDINGS: Transesophageal tube tip and side port distal to the GE junction, directed inferiorly and terminating in the left upper quadrant of, likely within the gastric body. Pacer pad partially imaged in the upper abdomen/lower chest. No high-grade obstructive bowel gas pattern. No suspicious calcifications. No acute or suspicious osseous lesions. IMPRESSION: Transesophageal tube tip and side port in the left upper quadrant of the abdomen, likely within the gastric body. No high-grade obstructive bowel gas pattern. Electronically Signed   By: Kreg ShropshirePrice  DeHay M.D.   On: 07/27/2019 17:10   Dg Chest Portable 1  View  Result Date: 07/27/2019 CLINICAL DATA:  Status post intubation. EXAM: PORTABLE CHEST 1 VIEW COMPARISON:  07/27/2019 FINDINGS: Interval placement of ET tube with tip above the carina. There is a nasogastric tube with tip below the level of the diaphragmatic hiatus. Normal heart size. No pleural effusion or edema identified. IMPRESSION: Status post intubation with tip above the carina. Lungs remain clear. Electronically Signed   By: Kerby Moors M.D.   On: 07/27/2019 17:11   Dg Chest Port 1 View  Result Date: 07/27/2019 CLINICAL DATA:  Altered mental status and hyperglycemia. EXAM: PORTABLE CHEST 1 VIEW COMPARISON:  05/06/2019 FINDINGS: Lungs are adequately inflated without focal airspace consolidation or effusion. Cardiomediastinal silhouette, bones and soft tissues are normal. IMPRESSION: No active disease. Electronically Signed   By: Marin Olp M.D.   On: 07/27/2019 15:10    EKG: Independently reviewed.   Assessment/Plan Neuro Acute metabolic encephalopathy secondary to DKA - negative CT head -Intubated and sedated -Continue Precedex.  Wean as tolerated to achieve RASS of -2 to 0  Cardio Tachycardic Briefly required Levophed in ED but able to wean prior to admission Aggressive volume  resuscitation Follow lactate  Respiratory Acute respiratory hypoxic and hypercapnic respiratory failure - negative CXR. Negative COVID -follow ABG -AC 40%, 5 PEEP -Daily SBT as tolerated   Renal Anion gap metabolic acidosis due to DKA Psudeohyperkalemia Acute kidney injury- creatinine of 1.77 on admission from baseline normal - follow serial BMP - continue insulin gtt  -Aggressive fluid resuscitation - follow output  Gastrointestinal - NPO. OG tube in place. - IV PPI prophylaxis  Endocrine Diabetic ketoacidosis secondary to uncontrolled type 1 diabetes Admit to ICU - continue insulin gtt with goal of 140-180 and AG <12 - IV NS until BG <250, then switch to D5 1/2 NS   Hematological - follow CBC - Lovenox prophylaxis  Infectious - no leukocytosis or fever. Unknown precipitating source of DKA. - continue to monitor for any fever     DVT prophylaxis:.Lovenox Code Status:Full Family Communication: No family at bedside  disposition Plan: Home with at least 2 midnight stays  Consults called: ICU Admission status: inpatient   Sanad Fearnow T Adedamola Seto DO [facility]   If 7PM-7AM, please contact night-coverage [email or web address] Password [facility]  07/27/2019, 6:41 PM

## 2019-07-27 NOTE — ED Notes (Signed)
ED TO INPATIENT HANDOFF REPORT  ED Nurse Name and Phone #:  Marybel Alcott [phone]  S Name/Age/Gender Kristine Allison 38 y.o. female Room/Bed: ED03A/ED03A  Code Status   Code Status: Full Code  Home/SNF/Other Home {Patient oriented to nothing Is this baseline? No   Triage Complete: Triage complete  Chief Complaint ams  Triage Note To ER via [facility] from home with c/o AMS and hyperglycemia. Pt has been lying in bed at home not feeling well over past few days, awaiting COVID test results. Pt CBG is 300's with EMS. Pt oxygen in low 80's with EMS. Pt alert to self, responsive to verbal stimuli. Arrives on NRB. Pt warm to touch, ill appearing.   Allergies Allergies  Allergen Reactions  . Latex Rash    Level of Care/Admitting Diagnosis ED Disposition    ED Disposition Condition Comment   Admit  Hospital Area: [facility] [[id number]]  Level of Care: ICU [6]  Covid Evaluation: Asymptomatic Screening Protocol (No Symptoms)  Diagnosis: DKA (diabetic ketoacidoses) [facility]) [[id number]]  Admitting Physician: Anselm Jungling [[id number]]  Attending Physician: Anselm Jungling [[id number]]  Estimated length of stay: past midnight tomorrow  Certification:: I certify this patient will need inpatient services for at least 2 midnights  PT Class (Do Not Modify): Inpatient [101]  PT Acc Code (Do Not Modify): Private [1]       B Medical/Surgery History Past Medical History:  Diagnosis Date  . Anxiety   . Asthma   . Diabetes mellitus without complication (HCC)   . VHQIONGE(952.8)    Past Surgical History:  Procedure Laterality Date  . NO PAST SURGERIES       A IV Location/Drains/Wounds Patient Lines/Drains/Airways Status   Active Line/Drains/Airways    Name:   Placement date:   Placement time:   Site:   Days:   Peripheral IV 07/27/19 Right Antecubital   07/27/19    1501    Antecubital   less than 1   Peripheral IV 07/27/19 Right Hand   07/27/19    1501    Hand   less than 1    Peripheral IV 07/27/19 Right Other (Comment)   07/27/19    1614    Other (Comment)   less than 1   NG/OG Tube Orogastric 18 Fr. Center mouth Aucultation Documented cm marking at nare/ corner of mouth 60 cm   07/27/19    1621    Center mouth   less than 1   Urethral Catheter Ethelle Lyon, NT2 Double-lumen 16 Fr.   07/27/19    1626    Double-lumen   less than 1   Airway 7.5 mm   07/27/19    1620     less than 1          Intake/Output Last 24 hours No intake or output data in the 24 hours ending 07/27/19 1903  Labs/Imaging Results for orders placed or performed during the hospital encounter of 07/27/19 (from the past 48 hour(s))  Lactic acid, plasma     Status: Abnormal   Collection Time: 07/27/19  2:46 PM  Result Value Ref Range   Lactic Acid, Venous 2.4 (HH) 0.5 - 1.9 mmol/L    Comment: CRITICAL VALUE NOTED. VALUE IS CONSISTENT WITH PREVIOUSLY REPORTED/CALLED VALUE / JAG Performed at [facility], [address] [place], [place] [address]   Lactic acid, plasma     Status: Abnormal   Collection Time: 07/27/19  2:46 PM  Result Value Ref Range  Lactic Acid, Venous 2.4 (HH) 0.5 - 1.9 mmol/L    Comment: CRITICAL RESULT CALLED TO, READ BACK BY AND VERIFIED WITH SHERRY Janya Eveland AT 1546 07/27/2019  TFK Performed at [facility], [address] [place], [place] [address]   Comprehensive metabolic panel     Status: Abnormal   Collection Time: 07/27/19  2:46 PM  Result Value Ref Range   Sodium 132 (L) 135 - 145 mmol/L    Comment: LYTES REPEATED MJU   Potassium 6.9 (HH) 3.5 - 5.1 mmol/L    Comment: CRITICAL RESULT CALLED TO, READ BACK BY AND VERIFIED WITH Alexandre Lightsey ALLYSON @1600  07/27/19 MJU    Chloride 91 (L) 98 - 111 mmol/L   CO2 15 (L) 22 - 32 mmol/L   Glucose, Bld 798 (HH) 70 - 99 mg/dL    Comment: CRITICAL RESULT CALLED TO, READ BACK BY AND VERIFIED WITH Dmonte Maher ALLYSON @1600  07/27/19 MJU    BUN 69 (H) 6 - 20 mg/dL   Creatinine, Ser (H) 0.44 - 1.00 mg/dL    Calcium 9.2 8.9 - 13/04/20 mg/dL   Total Protein 7.5 6.5 - 8.1 g/dL   Albumin 4.2 3.5 - 5.0 g/dL   AST 31 15 - 41 U/L   ALT 37 0 - 44 U/L   Alkaline Phosphatase 107 38 - 126 U/L   Total Bilirubin 2.3 (H) 0.3 - 1.2 mg/dL   GFR calc non Af Amer 36 (L) >60 mL/min   GFR calc Af Amer 42 (L) >60 mL/min   Anion gap 26 (H) 5 - 15    Comment: Performed at [facility], [address] [place], [address] [place]  CBC WITH DIFFERENTIAL     Status: Abnormal   Collection Time: 07/27/19  2:46 PM  Result Value Ref Range   WBC 7.6 4.0 - 10.5 K/uL   RBC 4.23 3.87 - 5.11 MIL/uL   Hemoglobin 13.5 12.0 - 15.0 g/dL   HCT 42706 13/04/20 - 23.7 %   MCV 103.3 (H) 80.0 - 100.0 fL   MCH 31.9 26.0 - 34.0 pg   MCHC 30.9 30.0 - 36.0 g/dL   RDW 62.8 31.5 - 17.6 %   Platelets 272 150 - 400 K/uL   nRBC 0.0 0.0 - 0.2 %   Neutrophils Relative % 73 %   Neutro Abs 5.5 1.7 - 7.7 K/uL   Lymphocytes Relative 9 %   Lymphs Abs 0.7 0.7 - 4.0 K/uL   Monocytes Relative 17 %   Monocytes Absolute 1.3 (H) 0.1 - 1.0 K/uL   Eosinophils Relative 0 %   Eosinophils Absolute 0.0 0.0 - 0.5 K/uL   Basophils Relative 0 %   Basophils Absolute 0.0 0.0 - 0.1 K/uL   Immature Granulocytes 1 %   Abs Immature Granulocytes 0.05 0.00 - 0.07 K/uL    Comment: Performed at [facility], [address] [place], [address] [place]  APTT     Status: None   Collection Time: 07/27/19  2:46 PM  Result Value Ref Range   aPTT 27 24 - 36 seconds    Comment: Performed at [facility], [address] [place], [address] [place]  Protime-INR     Status: None   Collection Time: 07/27/19  2:46 PM  Result Value Ref Range   Prothrombin Time 14.8 11.4 - 15.2 seconds   INR 1.2 0.8 - 1.2    Comment: (NOTE) INR goal varies based on device and disease states. Performed at [facility], [phone] [place], [place] [address]   Urinalysis, Routine w reflex microscopic     Status: Abnormal   Collection Time: 07/27/19   2:46 PM  Result Value Ref Range   Color, Urine STRAW (A) YELLOW   APPearance HAZY (A) CLEAR   Specific Gravity, Urine 1.017 1.005 - 1.030   pH 5.0 5.0 - 8.0   Glucose, UA >=500 (A) NEGATIVE mg/dL   Hgb urine dipstick NEGATIVE NEGATIVE   Bilirubin Urine NEGATIVE NEGATIVE   Ketones, ur 20 (A) NEGATIVE mg/dL   Protein, ur NEGATIVE NEGATIVE mg/dL   Nitrite NEGATIVE NEGATIVE   Leukocytes,Ua NEGATIVE NEGATIVE   WBC, UA 0-5 0 - 5 WBC/hpf   Bacteria, UA NONE SEEN NONE SEEN   Squamous Epithelial / LPF 0-5 0 - 5   Hyaline Casts, UA PRESENT     Comment: Performed at [facility], [address] [place], [place] [address]  Beta-hydroxybutyric acid     Status: Abnormal   Collection Time: 07/27/19  2:46 PM  Result Value Ref Range   Beta-Hydroxybutyric Acid >8.00 (H) 0.05 - 0.27 mmol/L    Comment: RESULT CONFIRMED BY MANUAL DILUTION/TFK Performed at [facility], [address] [place], [place] [address]   Blood gas, venous     Status: Abnormal   Collection Time: 07/27/19  2:46 PM  Result Value Ref Range   pH, Ven 7.07 (LL) 7.250 - 7.430    Comment: CRITICAL RESULT CALLED TO, READ BACK BY AND VERIFIED WITH: DR.FUNKE AT 1515 ON 07/27/2019 KSL    pCO2, Ven 51 44.0 - 60.0 mmHg   pO2, Ven 73.0 (H) 32.0 - 45.0 mmHg   Bicarbonate 14.8 (L) 20.0 - 28.0 mmol/L   Acid-base deficit 15.3 (H) 0.0 - 2.0 mmol/L   O2 Saturation 86.2 %   Patient temperature 37.0    Collection site VENOUS    Sample type VENOUS     Comment: Performed at [facility], [address] [place],  [address]  SARS Coronavirus 2 by RT PCR (hospital order, performed in [place] hospital lab) Nasopharyngeal Nasopharyngeal Swab     Status: None   Collection Time: 07/27/19  2:46 PM   Specimen: Nasopharyngeal Swab  Result Value Ref Range   SARS Coronavirus 2 NEGATIVE NEGATIVE    Comment: (NOTE) If result is NEGATIVE SARS-CoV-2 target nucleic acids are NOT DETECTED. The SARS-CoV-2 RNA is  generally detectable in upper and lower  respiratory specimens during the acute phase of infection. The lowest  concentration of SARS-CoV-2 viral copies this assay can detect is 250  copies / mL. A negative result does not preclude SARS-CoV-2 infection  and should not be used as the sole basis for treatment or other  patient management decisions.  A negative result may occur with  improper specimen collection / handling, submission of specimen other  than nasopharyngeal swab, presence of viral mutation(s) within the  areas targeted by this assay, and inadequate number of viral copies  (<250 copies / mL). A negative result must be combined with clinical  observations, patient history, and epidemiological information. If result is POSITIVE SARS-CoV-2 target nucleic acids are DETECTED. The SARS-CoV-2 RNA is generally detectable in upper and lower  respiratory specimens dur ing the acute phase of infection.  Positive  results are indicative of active infection with SARS-CoV-2.  Clinical  correlation with patient history and other diagnostic information is  necessary to determine patient infection status.  Positive results do  not rule out bacterial  infection or co-infection with other viruses. If result is PRESUMPTIVE POSTIVE SARS-CoV-2 nucleic acids MAY BE PRESENT.   A presumptive positive result was obtained on the submitted specimen  and confirmed on repeat testing.  While 2019 novel coronavirus  (SARS-CoV-2) nucleic acids may be present in the submitted sample  additional confirmatory testing may be necessary for epidemiological  and / or clinical management purposes  to differentiate between  SARS-CoV-2 and other Sarbecovirus currently known to infect humans.  If clinically indicated additional testing with an alternate test  methodology [phone]) is advised. The SARS-CoV-2 RNA is generally  detectable in upper and lower respiratory sp ecimens during the acute  phase of  infection. The expected result is Negative. Fact Sheet for Patients:  BoilerBrush.com.cy Fact Sheet for Healthcare Providers: https://pope.com/ This test is not yet approved or cleared by the [place] FDA and has been authorized for detection and/or diagnosis of SARS-CoV-2 by FDA under an Emergency Use Authorization (EUA).  This EUA will remain in effect (meaning this test can be used) for the duration of the COVID-19 declaration under Section 564(b)(1) of the Act, 21 U.S.C. section 360bbb-3(b)(1), unless the authorization is terminated or revoked sooner. Performed at [facility], [address] [place], [place] [address]   Glucose, capillary     Status: Abnormal   Collection Time: 07/27/19  2:46 PM  Result Value Ref Range   Glucose-Capillary >600 (HH) 70 - 99 mg/dL  Urine Drug Screen, Qualitative ([facility] only)     Status: None   Collection Time: 07/27/19  2:46 PM  Result Value Ref Range   Tricyclic, Ur Screen NONE DETECTED NONE DETECTED   Amphetamines, Ur Screen NONE DETECTED NONE DETECTED   MDMA (Ecstasy)Ur Screen NONE DETECTED NONE DETECTED   Cocaine Metabolite,Ur Sesser NONE DETECTED NONE DETECTED   Opiate, Ur Screen NONE DETECTED NONE DETECTED   Phencyclidine (PCP) Ur S NONE DETECTED NONE DETECTED   Cannabinoid 50 Ng, Ur  NONE DETECTED NONE DETECTED   Barbiturates, Ur Screen NONE DETECTED NONE DETECTED   Benzodiazepine, Ur Scrn NONE DETECTED NONE DETECTED   Methadone Scn, Ur NONE DETECTED NONE DETECTED    Comment: (NOTE) Tricyclics + metabolites, urine    Cutoff 1000 ng/mL Amphetamines + metabolites, urine  Cutoff 1000 ng/mL MDMA (Ecstasy), urine              Cutoff 500 ng/mL Cocaine Metabolite, urine          Cutoff 300 ng/mL Opiate + metabolites, urine        Cutoff 300 ng/mL Phencyclidine (PCP), urine         Cutoff 25 ng/mL Cannabinoid, urine                 Cutoff 50 ng/mL Barbiturates + metabolites, urine   Cutoff 200 ng/mL Benzodiazepine, urine              Cutoff 200 ng/mL Methadone, urine                   Cutoff 300 ng/mL The urine drug screen provides only a preliminary, unconfirmed analytical test result and should not be used for non-medical purposes. Clinical consideration and professional judgment should be applied to any positive drug screen result due to possible interfering substances. A more specific alternate chemical method must be used in order to obtain a confirmed analytical result. Gas chromatography / mass spectrometry (GC/MS) is the preferred confirmat ory method. Performed at [facility], [address]  [place], [place] [address]   Pregnancy, urine     Status: None   Collection Time: 07/27/19  3:11 PM  Result Value Ref Range   Preg Test, Ur NEGATIVE NEGATIVE    Comment: Performed at [facility], [address] [place], [place] [address]  Magnesium     Status: Abnormal   Collection Time: 07/27/19  3:15 PM  Result Value Ref Range   Magnesium 3.1 (H) 1.7 - 2.4 mg/dL    Comment: Performed at [facility], [address] [place], [place] [address]  Phosphorus     Status: Abnormal   Collection Time: 07/27/19  3:15 PM  Result Value Ref Range   Phosphorus 7.4 (H) 2.5 - 4.6 mg/dL    Comment: Performed at [facility], [address] [place], [place] [address]  Salicylate level     Status: None   Collection Time: 07/27/19  3:15 PM  Result Value Ref Range   Salicylate Lvl <7.0 2.8 - 30.0 mg/dL    Comment: Performed at [facility], [address] [place], [place] [address]  Acetaminophen level     Status: Abnormal   Collection Time: 07/27/19  3:15 PM  Result Value Ref Range   Acetaminophen (Tylenol), Serum <10 (L) 10 - 30 ug/mL    Comment: (NOTE) Therapeutic concentrations vary significantly. A range of 10-30 ug/mL  may be an effective concentration for many patients. However, some  are best treated at  concentrations outside of this range. Acetaminophen concentrations >150 ug/mL at 4 hours after ingestion  and >50 ug/mL at 12 hours after ingestion are often associated with  toxic reactions. Performed at [facility], [address] [place], [place] [address]   Ethanol     Status: None   Collection Time: 07/27/19  3:15 PM  Result Value Ref Range   Alcohol, Ethyl (B) <10 <10 mg/dL    Comment: (NOTE) Lowest detectable limit for serum alcohol is 10 mg/dL. For medical purposes only. Performed at [facility], [address] [place], [place] [address]   Glucose, capillary     Status: Abnormal   Collection Time: 07/27/19  4:19 PM  Result Value Ref Range   Glucose-Capillary >600 (HH) 70 - 99 mg/dL   Comment 1 Notify RN   Blood gas, arterial ([facility] & [facility] ONLY)     Status: Abnormal   Collection Time: 07/27/19  4:28 PM  Result Value Ref Range   FIO2 0.40    Delivery systems VENTILATOR    Mode ASSIST CONTROL    VT 430 mL   Peep/cpap 5.0 cm H20   pH, Arterial 6.98 (LL) 7.350 - 7.450    Comment: CRITICAL RESULT CALLED TO, READ BACK BY AND VERIFIED WITH: DR.JESSUP AT 1640 ON 07/27/2019 KSL    pCO2 arterial 76 (HH) 32.0 - 48.0 mmHg    Comment: CRITICAL RESULT CALLED TO, READ BACK BY AND VERIFIED WITH: DR.JESSUP AT 1640 ON 07/27/2019 KSL    pO2, Arterial 126 (H) 83.0 - 108.0 mmHg   Bicarbonate 17.9 (L) 20.0 - 28.0 mmol/L   Acid-base deficit 14.8 (H) 0.0 - 2.0 mmol/L   O2 Saturation 96.5 %   Patient temperature 37.0    Collection site RIGHT RADIAL    Sample type ARTERIAL DRAW    Allens test (pass/fail) PASS PASS   Mechanical Rate 20     Comment: Performed at [facility], [address] [place], [place] [address]  Glucose, capillary  Status: Abnormal   Collection Time: 07/27/19  5:24 PM  Result Value Ref Range   Glucose-Capillary >600 (HH) 70 - 99 mg/dL   Comment 1 Notify RN   Blood gas, arterial     Status: Abnormal   Collection Time: 07/27/19   5:30 PM  Result Value Ref Range   FIO2 0.40    Delivery systems VENTILATOR    Mode ASSIST CONTROL    VT 430 mL   Peep/cpap 5.0 cm H20   pH, Arterial 7.14 (LL) 7.350 - 7.450    Comment: CRITICAL RESULT, NOTIFIED PHYSICIAN DR.JESSUP AT 1755 ON 07/27/2019 KSL    pCO2 arterial 54 (H) 32.0 - 48.0 mmHg   pO2, Arterial 71 (L) 83.0 - 108.0 mmHg   Bicarbonate 18.4 (L) 20.0 - 28.0 mmol/L   Acid-base deficit 10.9 (H) 0.0 - 2.0 mmol/L   O2 Saturation 87.8 %   Patient temperature 37.0    Collection site LEFT RADIAL    Sample type ARTERIAL DRAW    Allens test (pass/fail) PASS PASS   Mechanical Rate 20     Comment: Performed at [facility], [address] [place], [place] [address]   Dg Abdomen 1 View  Result Date: 07/27/2019 CLINICAL DATA:  OG placement EXAM: ABDOMEN - 1 VIEW COMPARISON:  Chest radiograph 07/27/2019 FINDINGS: Transesophageal tube tip and side port distal to the GE junction, directed inferiorly and terminating in the left upper quadrant of, likely within the gastric body. Pacer pad partially imaged in the upper abdomen/lower chest. No high-grade obstructive bowel gas pattern. No suspicious calcifications. No acute or suspicious osseous lesions. IMPRESSION: Transesophageal tube tip and side port in the left upper quadrant of the abdomen, likely within the gastric body. No high-grade obstructive bowel gas pattern. Electronically Signed   By: Kreg Shropshire M.D.   On: 07/27/2019 17:10   Ct Head Wo Contrast  Result Date: 07/27/2019 CLINICAL DATA:  38 year old female with altered mental status, presumed DKA EXAM: CT HEAD WITHOUT CONTRAST CT CERVICAL SPINE WITHOUT CONTRAST TECHNIQUE: Multidetector CT imaging of the head and cervical spine was performed following the standard protocol without intravenous contrast. Multiplanar CT image reconstructions of the cervical spine were also generated. COMPARISON:  None. FINDINGS: CT HEAD FINDINGS Brain: No acute intracranial hemorrhage. No  midline shift or mass effect. Gray-white differentiation maintained. Unremarkable appearance of the ventricular system. Vascular: Unremarkable. Skull: No acute fracture.  No aggressive bone lesion identified. Sinuses/Orbits: Unremarkable appearance of the orbits. Mastoid air cells clear. No middle ear effusion. No significant sinus disease. Other: None CT CERVICAL SPINE FINDINGS Alignment: Craniocervical junction aligned. Anatomic alignment of the cervical elements. No subluxation. Skull base and vertebrae: No acute fracture at the skullbase. Vertebral body heights relatively maintained. No acute fracture identified. Soft tissues and spinal canal: Unremarkable cervical soft tissues. Lymph nodes are present, though not enlarged. Disc levels: Unremarkable appearance of disc space, which are maintained. Upper chest: Unremarkable appearance of the lung apices. Other: No bony canal narrowing. IMPRESSION: Head CT: No CT evidence of acute intracranial abnormality. Cervical CT: No acute fracture or malalignment of the cervical spine. Electronically Signed   By: Gilmer Mor D.O.   On: 07/27/2019 18:44   Ct Cervical Spine Wo Contrast  Result Date: 07/27/2019 CLINICAL DATA:  38 year old female with altered mental status, presumed DKA EXAM: CT HEAD WITHOUT CONTRAST CT CERVICAL SPINE WITHOUT CONTRAST TECHNIQUE: Multidetector CT imaging of the head and cervical spine was performed following the standard protocol without intravenous contrast. Multiplanar CT  image reconstructions of the cervical spine were also generated. COMPARISON:  None. FINDINGS: CT HEAD FINDINGS Brain: No acute intracranial hemorrhage. No midline shift or mass effect. Gray-white differentiation maintained. Unremarkable appearance of the ventricular system. Vascular: Unremarkable. Skull: No acute fracture.  No aggressive bone lesion identified. Sinuses/Orbits: Unremarkable appearance of the orbits. Mastoid air cells clear. No middle ear effusion. No  significant sinus disease. Other: None CT CERVICAL SPINE FINDINGS Alignment: Craniocervical junction aligned. Anatomic alignment of the cervical elements. No subluxation. Skull base and vertebrae: No acute fracture at the skullbase. Vertebral body heights relatively maintained. No acute fracture identified. Soft tissues and spinal canal: Unremarkable cervical soft tissues. Lymph nodes are present, though not enlarged. Disc levels: Unremarkable appearance of disc space, which are maintained. Upper chest: Unremarkable appearance of the lung apices. Other: No bony canal narrowing. IMPRESSION: Head CT: No CT evidence of acute intracranial abnormality. Cervical CT: No acute fracture or malalignment of the cervical spine. Electronically Signed   By: Gilmer Mor D.O.   On: 07/27/2019 18:44   Dg Chest Portable 1 View  Result Date: 07/27/2019 CLINICAL DATA:  Status post intubation. EXAM: PORTABLE CHEST 1 VIEW COMPARISON:  07/27/2019 FINDINGS: Interval placement of ET tube with tip above the carina. There is a nasogastric tube with tip below the level of the diaphragmatic hiatus. Normal heart size. No pleural effusion or edema identified. IMPRESSION: Status post intubation with tip above the carina. Lungs remain clear. Electronically Signed   By: Signa Kell M.D.   On: 07/27/2019 17:11   Dg Chest Port 1 View  Result Date: 07/27/2019 CLINICAL DATA:  Altered mental status and hyperglycemia. EXAM: PORTABLE CHEST 1 VIEW COMPARISON:  05/06/2019 FINDINGS: Lungs are adequately inflated without focal airspace consolidation or effusion. Cardiomediastinal silhouette, bones and soft tissues are normal. IMPRESSION: No active disease. Electronically Signed   By: Elberta Fortis M.D.   On: 07/27/2019 15:10    Pending Labs Unresulted Labs (From admission, onward)    Start     Ordered   07/28/19 0500  CBC  Tomorrow morning,   STAT     07/27/19 1835   07/27/19 1837  Basic metabolic panel  Now then every 4 hours,   STAT      07/27/19 1836   07/27/19 1801  Basic metabolic panel  ONCE - STAT,   STAT     07/27/19 1800   07/27/19 1439  Blood Culture (routine x 2)  BLOOD CULTURE X 2,   STAT     07/27/19 1438   07/27/19 1439  Urine culture  ONCE - STAT,   STAT     07/27/19 1438          Vitals/Pain Today's Vitals   07/27/19 1800 07/27/19 1830 07/27/19 1845 07/27/19 1900  BP: 111/76 101/70 96/74 98/70   Pulse:  (!) 122 (!) 119 (!) 114  Resp:  (!) 24 (!) 26 (!) 23  Temp:      TempSrc:      SpO2:  96% (!) 89% 92%  Weight:      Height:      PainSc:        Isolation Precautions Airborne and Contact precautions  Medications Medications  insulin regular, human (MYXREDLIN) 100 units/ 100 mL infusion (14 Units/hr Intravenous Rate/Dose Change 07/27/19 1857)  dextrose 50 % solution 25 mL (has no administration in time range)  0.45 % sodium chloride infusion (1,000 mLs Intravenous New Bag/Given 07/27/19 1745)  dextrose 5 %-0.45 % sodium chloride infusion (has  no administration in time range)  norepinephrine (LEVOPHED) 4mg  in 250mL premix infusion (0 mcg/min Intravenous Stopped 07/27/19 1749)  fentaNYL (SUBLIMAZE) injection 100 mcg (has no administration in time range)  fentaNYL (SUBLIMAZE) injection 100 mcg (has no administration in time range)  dexmedetomidine (PRECEDEX) 400 MCG/100ML (4 mcg/mL) infusion (0.5 mcg/kg/hr  63.1 kg Intravenous Rate/Dose Change 07/27/19 1825)  enoxaparin (LOVENOX) injection 40 mg (has no administration in time range)  pantoprazole (PROTONIX) injection 40 mg (has no administration in time range)  sodium chloride 0.9 % bolus 1,000 mL (0 mLs Intravenous Stopped 07/27/19 1618)  lactated ringers bolus 1,000 mL (0 mLs Intravenous Stopped 07/27/19 1618)  calcium gluconate inj 10% (1 g) URGENT USE ONLY! (1 g Intravenous Given 07/27/19 1607)  sodium bicarbonate injection 50 mEq (50 mEq Intravenous Given 07/27/19 1606)  rocuronium (ZEMURON) injection 70 mg (70 mg Intravenous Given 07/27/19 1616)   etomidate (AMIDATE) injection 15 mg (15 mg Intravenous Given 07/27/19 1615)  lactated ringers bolus 1,000 mL (0 mLs Intravenous Stopped 07/27/19 1800)    Mobility Normally walks but is unresponsive now High fall risk   Focused Assessments dka   R Recommendations: See Admitting Provider Note  Report given to:   Additional Notes: mom is aware of status

## 2019-07-27 NOTE — ED Notes (Signed)
This RN in with Kristine Allison from [facility]. Pt to go to CT for scan. Pt sats are currently 50% on 6 liters and respiratory rate has dropped. Pt face has a blue tinge. MD notified and with this RN began bagging pt. Pt stats returning to normal. ER team in room to assist with intubation. Respiratory paged.

## 2019-07-27 NOTE — Progress Notes (Signed)
Pharmacy Antibiotic Note  Kristine Allison is a 38 y.o. female admitted on 07/27/2019 with pneumonia.  Pharmacy has been consulted for Unasyn dosing. CrCl = 37.9 ml/min   Plan: Unasyn 3 gm IV Q6H ordered to start on 11/4 @ 2100.   Height: 5\' 4"  (162.6 cm) Weight: 139 lb (63 kg) IBW/kg (Calculated) : 54.7  Temp (24hrs), Avg:97.8 F (36.6 C), Min:97.8 F (36.6 C), Max:97.8 F (36.6 C)  Recent Labs  Lab 07/27/19 1446  WBC 7.6  CREATININE 1.77*  LATICACIDVEN 2.4*  2.4*    Estimated Creatinine Clearance: 37.9 mL/min (A) (by C-G formula based on SCr of 1.77 mg/dL (H)).    Allergies  Allergen Reactions  . Latex Rash    Antimicrobials this admission:   >>    >>   Dose adjustments this admission:   Microbiology results:  BCx:  UCx:    Sputum:    MRSA PCR:   Thank you for allowing pharmacy to be a part of this patient's care.  Eusevio Schriver D 07/27/2019 8:43 PM

## 2019-07-27 NOTE — ED Provider Notes (Signed)
PROCEDURES  Procedure(s) performed (including Critical Care):  .Critical Care Performed by: Blake Divine, MD Authorized by: Blake Divine, MD   Critical care provider statement:    Critical care time (minutes):  45   Critical care time was exclusive of:  Separately billable procedures and treating other patients and teaching time   Critical care was necessary to treat or prevent imminent or life-threatening deterioration of the following conditions:  Shock and metabolic crisis   Critical care was time spent personally by me on the following activities:  Discussions with consultants, evaluation of patient's response to treatment, examination of patient, ordering and performing treatments and interventions, ordering and review of laboratory studies, ordering and review of radiographic studies, pulse oximetry, re-evaluation of patient's condition, obtaining history from patient or surrogate and review of old charts   I assumed direction of critical care for this patient from another provider in my specialty: no       ____________________________________________   INITIAL IMPRESSION / ASSESSMENT AND PLAN / ED COURSE       ----------------------------------------- 3:47 PM on 07/27/2019 -----------------------------------------  Patient received in turnover from Dr. Jari Pigg, see original HPI.  Patient with history of type 1 diabetes and recurrent DKA presenting with altered mental status and shortness of breath.  She is hypoxic requiring additional oxygen supplementation with what appears to be Kussmaul breathing, hyperglycemia noted.  Blood pressure is low and patient receiving 2 L IV fluid bolus, awaiting lab results but chest x-ray thus far negative for acute process.  Per chart, patient did have negative COVID-19 results from 2 days prior.  Called the patient's room as she had an acute change with dramatically decreased respiratory effort.  Patient near apneic with O2 sats in the 50s.   We were able to bring her O2 sats up with bag-valve-mask, she was given a calcium bolus due to her hyperkalemia as well as a amp of bicarb.  She was subsequently intubated but did appear to have some aspiration during the process of intubation secondary to prolonged use of bag-valve-mask.  She had a period of hypotension following intubation, was started on Levophed drip, which was eventually weaned off.  Patient was also started at a respiratory rate of 20 given DKA and need for increased ventilation to reduce CO2.  She was started on an insulin drip for her DKA and potassium was withheld from her IV fluids given her hyperkalemia.  Patient started on Precedex drip for sedation.  Case discussed with hospitalist, who accepts patient for admission.  Repeat ABG shows improvement in pH although patient continues to have elevated CO2.  Will increase her respiratory rate to 24 and continue to follow labs. Patient did have some hypoxia while on the vent, was suctioned with improvement along with increase of FiO2 to 60%.      ____________________________________________   FINAL CLINICAL IMPRESSION(S) / ED DIAGNOSES  Final diagnoses:  Diabetic ketoacidosis with coma associated with type 1 diabetes mellitus ([place])  Hyperkalemia  AKI (acute kidney injury) [facility])     ED Discharge Orders    None       Note:  This document was prepared using Dragon voice recognition software and may include unintentional dictation errors.   Blake Divine, MD 07/27/19 2106

## 2019-07-27 NOTE — ED Provider Notes (Signed)
[facility] Emergency Department Provider Note  ____________________________________________   First MD Initiated Contact with Patient 07/27/19 1437     (approximate)  I have reviewed the triage vital signs and the nursing notes.   HISTORY  Chief Complaint Hyperglycemia and Altered Mental Status    HPI Kristine Allison is a 38 y.o. female who has been feeling unwell since Monday.  Patient has diabetes and was tested for coronavirus on Monday.  Those test are negative.  However patient has been acting more altered and confused.  On EMS arrival she was satting in the 80s on room air and was altered from her baseline.  They had a sugar of greater than 300.  Unable to get full HPI due to patient's altered mental status     Past Medical History:  Diagnosis Date  . Anxiety   . Asthma   . Diabetes mellitus without complication (HCC)   . DGUYQIHK(742.5Headache(784.0)     Patient Active Problem List   Diagnosis Date Noted  . Malnutrition of moderate degree 05/09/2019  . DKA, type 1 (HCC) 05/08/2019  . Anxiety 08/07/2018  . DKA (diabetic ketoacidoses) (HCC) 08/07/2018  . Edema of the tongue 03/26/2018  . Dehydration   . Diabetic ketoacidosis without coma associated with type 1 diabetes mellitus (HCC)   . Yeast infection 02/09/2015  . Acute renal failure syndrome (HCC)   . Tachycardia 11/03/2014  . Insomnia 09/08/2014  . Dizziness 09/08/2014  . Migraine, unspecified 12/14/2013  . Elevated troponin I level 11/19/2013  . Acute myocardial infarction, subendocardial infarction, initial episode of care (HCC) 11/18/2013  . Noncompliance with medication regimen 11/15/2013  . Leg pain 09/08/2013  . Loss of weight 09/08/2013  . Depression 09/08/2013  . Hypotension, unspecified 09/08/2013  . Tobacco use disorder 08/01/2013  . Diabetes mellitus type 1, uncontrolled (HCC) 06/24/2012  . GERD (gastroesophageal reflux disease) 06/17/2012    Past Surgical History:   Procedure Laterality Date  . NO PAST SURGERIES      Prior to Admission medications   Medication Sig Start Date End Date Taking? Authorizing Provider  Ensure Max Protein (ENSURE MAX PROTEIN) LIQD Take 330 mLs (11 oz total) by mouth 2 (two) times daily between meals. 05/10/19   Alford HighlandWieting, Richard, MD  Insulin Glargine (BASAGLAR KWIKPEN) 100 UNIT/ML SOPN Inject 0.15 mLs (15 Units total) into the skin every evening. 05/10/19   Alford HighlandWieting, Richard, MD  insulin lispro (HUMALOG KWIKPEN) 100 UNIT/ML KwikPen Inject 0.04 mLs (4 Units total) into the skin 3 (three) times daily. 05/10/19   Alford HighlandWieting, Richard, MD  ondansetron (ZOFRAN) 4 MG tablet Take 1 tablet (4 mg total) by mouth every 6 (six) hours as needed for nausea. 01/18/19   Altamese DillingVachhani, Vaibhavkumar, MD    Allergies Latex  Family History  Problem Relation Age of Onset  . Heart disease Other        No family history    Social History Social History   Tobacco Use  . Smoking status: Current Every Day Smoker    Packs/day: 0.25    Years: 20.00    Pack years: 5.00    Types: Cigarettes  . Smokeless tobacco: Never Used  Substance Use Topics  . Alcohol use: No    Alcohol/week: 0.0 standard drinks  . Drug use: No      Review of Systems Unable to get review of system due to patient's altered mental status ____________________________________________   PHYSICAL EXAM:  VITAL SIGNS: Blood pressure (!) 93/41, pulse Marland Kitchen(!)  130, temperature 97.8 F (36.6 C), temperature source Axillary, resp. rate (!) 22, height 5\' 4"  (1.626 m), weight 63 kg, last menstrual period 10/23/2013, SpO2 98 %.   Constitutional: Altered, confused, Eyes: Conjunctivae are normal. EOMI. pupils are equal and reactive Head: Atraumatic. Nose: No congestion/rhinnorhea. Mouth/Throat: Mucous membranes are dry Neck: No stridor. Trachea Midline. FROM Cardiovascular: tachycardiac, regular rhythm. Grossly normal heart sounds.  Good peripheral circulation. Respiratory: Normal  respiratory effort.  No retractions. Lungs CTAB. Gastrointestinal: Soft and nontender. No distention. No abdominal bruits.  Musculoskeletal: No lower extremity tenderness nor edema.  No joint effusions. Neurologic: Patient is altered but able to follow simple commands like squeeze her bilateral hands as well as wiggles her toes. Skin:  Skin is warm, dry and intact. No rash noted. Psychiatric: Unable to fully assess. GU: Deferred   ____________________________________________   LABS (all labs ordered are listed, but only abnormal results are displayed)  Labs Reviewed  BLOOD GAS, VENOUS - Abnormal; Notable for the following components:      Result Value   pH, Ven 7.07 (*)    pO2, Ven 73.0 (*)    Bicarbonate 14.8 (*)    Acid-base deficit 15.3 (*)    All other components within normal limits  GLUCOSE, CAPILLARY - Abnormal; Notable for the following components:   Glucose-Capillary >600 (*)    All other components within normal limits  CULTURE, BLOOD (ROUTINE X 2)  CULTURE, BLOOD (ROUTINE X 2)  URINE CULTURE  SARS CORONAVIRUS 2 BY RT PCR (HOSPITAL ORDER, PERFORMED IN [facility])  PREGNANCY, URINE  LACTIC ACID, PLASMA  LACTIC ACID, PLASMA  COMPREHENSIVE METABOLIC PANEL  CBC WITH DIFFERENTIAL/PLATELET  APTT  PROTIME-INR  URINALYSIS, ROUTINE W REFLEX MICROSCOPIC  BETA-HYDROXYBUTYRIC ACID  MAGNESIUM  PHOSPHORUS  SALICYLATE LEVEL  ACETAMINOPHEN LEVEL  URINE DRUG SCREEN, QUALITATIVE ([facility] ONLY)  ETHANOL   ____________________________________________   ED ECG REPORT I, 12/21/2013, the attending physician, personally viewed and interpreted this ECG.  EKG sinus tachycardia, no ST elevation, no T wave inversions, normal intervals ____________________________________________  RADIOLOGY [name], personally viewed and evaluated these images (plain radiographs) as part of my medical decision making, as well as reviewing the written report by the radiologist.   ED MD interpretation: No pneumonia  Official radiology report(s): Dg Chest Port 1 View  Result Date: 07/27/2019 CLINICAL DATA:  Altered mental status and hyperglycemia. EXAM: PORTABLE CHEST 1 VIEW COMPARISON:  05/06/2019 FINDINGS: Lungs are adequately inflated without focal airspace consolidation or effusion. Cardiomediastinal silhouette, bones and soft tissues are normal. IMPRESSION: No active disease. Electronically Signed   By: 05/08/2019 M.D.   On: 07/27/2019 15:10    ____________________________________________   PROCEDURES  Procedure(s) performed (including Critical Care):  Procedures   ____________________________________________   INITIAL IMPRESSION / ASSESSMENT AND PLAN / ED COURSE  Kristine Allison was evaluated in Emergency Department on 07/27/2019 for the symptoms described in the history of present illness. She was evaluated in the context of the global COVID-19 pandemic, which necessitated consideration that the patient might be at risk for infection with the SARS-CoV-2 virus that causes COVID-19. Institutional protocols and algorithms that pertain to the evaluation of patients at risk for COVID-19 are in a state of rapid change based on information released by regulatory bodies including the CDC and federal and state organizations. These policies and algorithms were followed during the patient's care in the ED.    Patient presents with altered mental status and elevated  sugars.  I suspect this is more likely related to DKA but will get labs to evaluate for pneumonia, coronavirus given the hypoxia.  Also get labs to evaluate for electrolyte abnormalities, AKI.  Patient looks extremely dry upon examination given her tachycardia and hypotension patient started on 2 L of fluids.  Patient is on 6 L nasal cannula.  She does not have any wheezing upon examination.  Will get chest x-ray and coronavirus to further evaluate her hypoxia.  Her VBG is evident of acidemia with a  pH of 7.07.  She is slightly hypercapnic at 51 which is concerning given she should be breathing off her CO2.  Discussed with the oncoming doctor Dr. Charna Archer and he is going to closely watch her mental status and repeat a VBG.  If CO2 is climbing she may need intubation or mentation is worsening she has a high chance for needing intubation.  During covid swab pt did withdraw from pain so appears to be protecting airway at this time.  We also added on salicylate and Tylenol levels as well as drug screen to make sure is no evidence of ingestion that could be causing this mixed picture acidosis.   Patient handed off to Dr. Charna Archer for further care.   ____________________________________________   FINAL CLINICAL IMPRESSION(S) / ED DIAGNOSES   Final diagnoses:  None      MEDICATIONS GIVEN DURING THIS VISIT:  Medications - No data to display   ED Discharge Orders    None       Note:  This document was prepared using Dragon voice recognition software and may include unintentional dictation errors.   Vanessa [place], MD 07/27/19 [phone]

## 2019-07-27 NOTE — Progress Notes (Signed)
Patient admitted to [place] from ED on ventilator. Precedex, insulin and 1/2 NS running gtts. Patient hooked up to monitor and ICU ventilator, Vital signs stable. Report received from [place], [place] via phone. Propofol gtt started and labs sent. Will continue to monitor, see mar and flowsheets for more details

## 2019-07-27 NOTE — ED Notes (Signed)
New order received. FSBG after both liters are complete. Foley cath ordered due to being unstable and to track output.

## 2019-07-27 NOTE — Consult Note (Signed)
Name: Kristine Allison MRN: [id number] DOB: 09-11-1981    ADMISSION DATE:  07/27/2019 CONSULTATION DATE: 07/27/2019  REFERRING MD : Dr. Flossie Buffy   CHIEF COMPLAINT: Altered Mental Status   BRIEF PATIENT DESCRIPTION:  38 yo female admitted with acute encephalopathy and acute hypoxic respiratory failure secondary to possible aspiration pneumonia and anion gap metabolic acidosis in the setting of DKA requiring insulin gtt and mechanical intubation   SIGNIFICANT EVENTS/STUDIES:  11/4: Pt admitted to ICU with DKA mechanically intubated 11/4: CT Head/Cervical Spine revealed no evidence of acute intracranial abnormality and no acute fracture or malalignment of the cervical spine  HISTORY OF PRESENT ILLNESS:   This is a 38 yo female with a PMH of Headache, Type I Diabetes Mellitus, Frequent Hospitalization secondary to DKA, Malnutrition of Moderate Degree, Anxiety, and Asthma. She presented to [facility] ER via EMS on 11/4 with altered mental status and hyperglycemia.  Per ER notes it was reported pt developed cough and c/o not feeling well 3 days prior to presentation and awaiting COVID-19 test results.  EMS reported upon arrival at pts home pt alert to self only, responsive to verbal stimulation, confused, CBG reading in the 300's, and hypoxic with O2 sats in the low 80's requiring NRB.  In the ER pt hypotensive and tachycardic, therefore she received 3L fluid boluses.  CXR and COVID-19 negative.  Lab results revealed Na+ 132, K+ 6.9, chloride 91, CO2 15, glucose 798, BUN 69, creatinine 1.77, anion 26, lactic acid 2.4, beta- hydroxybutyric acid >8.00, acetaminophen level <49, salicylate level <2.0, urine drug screen negative, UA negative for UTI, and abg pH 7.07/pCO2 51/pO2 73/acid-base deficit 15.3/bicarb 14.8. She received 1 amp of bicarb and insulin gtt initiated due to DKA. CT Head/Cervical Spine ordered and during transport to CT pt developed worsening hypoxia with O2 sats 50% on 6L and some respiratory  depression subsequently requiring mechanical intubation.  CT Head/Cervical Spine resuilts negative. She was subsequently admitted to ICU per hospitalist team for additional workup and treatment   PAST MEDICAL HISTORY :   has a past medical history of Anxiety, Asthma, Diabetes mellitus without complication ([place]), and FEOFHQRF(758.8).  has a past surgical history that includes No past surgeries. Prior to Admission medications   Medication Sig Start Date End Date Taking? Authorizing Provider  Ensure Max Protein (ENSURE MAX PROTEIN) LIQD Take 330 mLs (11 oz total) by mouth 2 (two) times daily between meals. 05/10/19   Loletha Grayer, MD  Insulin Glargine (BASAGLAR KWIKPEN) 100 UNIT/ML SOPN Inject 0.15 mLs (15 Units total) into the skin every evening. 05/10/19   Loletha Grayer, MD  insulin lispro (HUMALOG KWIKPEN) 100 UNIT/ML KwikPen Inject 0.04 mLs (4 Units total) into the skin 3 (three) times daily. 05/10/19   Loletha Grayer, MD  ondansetron (ZOFRAN) 4 MG tablet Take 1 tablet (4 mg total) by mouth every 6 (six) hours as needed for nausea. 01/18/19   Vaughan Basta, MD   Allergies  Allergen Reactions   Latex Rash    FAMILY HISTORY:  family history includes Heart disease in an other family member. SOCIAL HISTORY:  reports that she has been smoking cigarettes. She has a 5.00 pack-year smoking history. She has never used smokeless tobacco. She reports that she does not drink alcohol or use drugs.  REVIEW OF SYSTEMS:   Unable to assess pt intubated   SUBJECTIVE:  Unable to assess pt intubated   VITAL SIGNS: Temp:  [97.8 F (36.6 C)] 97.8 F (36.6 C) (11/04 1440) Pulse Rate:  [  114-137] 114 (11/04 1900) Resp:  [16-46] 23 (11/04 1900) BP: (87-120)/(32-76) 98/70 (11/04 1900) SpO2:  [33 %-100 %] 92 % (11/04 1900) FiO2 (%):  [40 %] 40 % (11/04 1800) Weight:  [63 kg] 63 kg (11/04 1440)  PHYSICAL EXAMINATION: General: acutely ill appearing female, NAD mechanically intubated    Neuro: sedated, not following commands, PERRL  HEENT: supple, no JVD  Cardiovascular: nsr, rrr, no R/G  Lungs: rhonchi throughout, slightly tachypneic  Abdomen: +BS x4, soft, non tender, non distended  Musculoskeletal: normal bulk and tone, no edema  Skin: intact no rashes or lesions present   Recent Labs  Lab 07/27/19 1446  NA 132*  K 6.9*  CL 91*  CO2 15*  BUN 69*  CREATININE 1.77*  GLUCOSE 798*   Recent Labs  Lab 07/27/19 1446  HGB 13.5  HCT 43.7  WBC 7.6  PLT 272   Dg Abdomen 1 View  Result Date: 07/27/2019 CLINICAL DATA:  OG placement EXAM: ABDOMEN - 1 VIEW COMPARISON:  Chest radiograph 07/27/2019 FINDINGS: Transesophageal tube tip and side port distal to the GE junction, directed inferiorly and terminating in the left upper quadrant of, likely within the gastric body. Pacer pad partially imaged in the upper abdomen/lower chest. No high-grade obstructive bowel gas pattern. No suspicious calcifications. No acute or suspicious osseous lesions. IMPRESSION: Transesophageal tube tip and side port in the left upper quadrant of the abdomen, likely within the gastric body. No high-grade obstructive bowel gas pattern. Electronically Signed   By: Lovena Le M.D.   On: 07/27/2019 17:10   Ct Head Wo Contrast  Result Date: 07/27/2019 CLINICAL DATA:  38 year old female with altered mental status, presumed DKA EXAM: CT HEAD WITHOUT CONTRAST CT CERVICAL SPINE WITHOUT CONTRAST TECHNIQUE: Multidetector CT imaging of the head and cervical spine was performed following the standard protocol without intravenous contrast. Multiplanar CT image reconstructions of the cervical spine were also generated. COMPARISON:  None. FINDINGS: CT HEAD FINDINGS Brain: No acute intracranial hemorrhage. No midline shift or mass effect. Gray-white differentiation maintained. Unremarkable appearance of the ventricular system. Vascular: Unremarkable. Skull: No acute fracture.  No aggressive bone lesion identified.  Sinuses/Orbits: Unremarkable appearance of the orbits. Mastoid air cells clear. No middle ear effusion. No significant sinus disease. Other: None CT CERVICAL SPINE FINDINGS Alignment: Craniocervical junction aligned. Anatomic alignment of the cervical elements. No subluxation. Skull base and vertebrae: No acute fracture at the skullbase. Vertebral body heights relatively maintained. No acute fracture identified. Soft tissues and spinal canal: Unremarkable cervical soft tissues. Lymph nodes are present, though not enlarged. Disc levels: Unremarkable appearance of disc space, which are maintained. Upper chest: Unremarkable appearance of the lung apices. Other: No bony canal narrowing. IMPRESSION: Head CT: No CT evidence of acute intracranial abnormality. Cervical CT: No acute fracture or malalignment of the cervical spine. Electronically Signed   By: Corrie Mckusick D.O.   On: 07/27/2019 18:44   Ct Cervical Spine Wo Contrast  Result Date: 07/27/2019 CLINICAL DATA:  38 year old female with altered mental status, presumed DKA EXAM: CT HEAD WITHOUT CONTRAST CT CERVICAL SPINE WITHOUT CONTRAST TECHNIQUE: Multidetector CT imaging of the head and cervical spine was performed following the standard protocol without intravenous contrast. Multiplanar CT image reconstructions of the cervical spine were also generated. COMPARISON:  None. FINDINGS: CT HEAD FINDINGS Brain: No acute intracranial hemorrhage. No midline shift or mass effect. Gray-white differentiation maintained. Unremarkable appearance of the ventricular system. Vascular: Unremarkable. Skull: No acute fracture.  No aggressive bone lesion identified. Sinuses/Orbits:  Unremarkable appearance of the orbits. Mastoid air cells clear. No middle ear effusion. No significant sinus disease. Other: None CT CERVICAL SPINE FINDINGS Alignment: Craniocervical junction aligned. Anatomic alignment of the cervical elements. No subluxation. Skull base and vertebrae: No acute fracture  at the skullbase. Vertebral body heights relatively maintained. No acute fracture identified. Soft tissues and spinal canal: Unremarkable cervical soft tissues. Lymph nodes are present, though not enlarged. Disc levels: Unremarkable appearance of disc space, which are maintained. Upper chest: Unremarkable appearance of the lung apices. Other: No bony canal narrowing. IMPRESSION: Head CT: No CT evidence of acute intracranial abnormality. Cervical CT: No acute fracture or malalignment of the cervical spine. Electronically Signed   By: Corrie Mckusick D.O.   On: 07/27/2019 18:44   Dg Chest Portable 1 View  Result Date: 07/27/2019 CLINICAL DATA:  Status post intubation. EXAM: PORTABLE CHEST 1 VIEW COMPARISON:  07/27/2019 FINDINGS: Interval placement of ET tube with tip above the carina. There is a nasogastric tube with tip below the level of the diaphragmatic hiatus. Normal heart size. No pleural effusion or edema identified. IMPRESSION: Status post intubation with tip above the carina. Lungs remain clear. Electronically Signed   By: Kerby Moors M.D.   On: 07/27/2019 17:11   Dg Chest Port 1 View  Result Date: 07/27/2019 CLINICAL DATA:  Altered mental status and hyperglycemia. EXAM: PORTABLE CHEST 1 VIEW COMPARISON:  05/06/2019 FINDINGS: Lungs are adequately inflated without focal airspace consolidation or effusion. Cardiomediastinal silhouette, bones and soft tissues are normal. IMPRESSION: No active disease. Electronically Signed   By: Marin Olp M.D.   On: 07/27/2019 15:10    ASSESSMENT / PLAN:  Acute hypoxic respiratory failure secondary to anion gap metabolic acidosis in the setting of DKA and possible aspiration  Mechanical intubation  Hx: Asthma  Full vent support for now-vent settings reviewed and established SBT once all parameters met  VAP bundle implemented  Prn bronchodilator therapy  Repeat ABG  Trend WBC and monitor fever curve  Will check PCT  Follow cultures  Will start unasyn  for possible aspiration pneumonia   Will check influenza panel by pcr   Hypotension  Continuous telemetry monitoring  Aggressive fluid resuscitation to maintain map >65, if pt remains hypotensive will start neo-synephrine gtt  Will check pct to rule out sepsis   Acute renal failure with hyperkalemia, hyponatremia, and hypochloridemia secondary to volume depletion in the setting of DKA  Lactic acidosis Trend BMP and lactic acid  Replace electrolytes as indicated  Monitor UOP Avoid nephrotoxic medications  Diabetic ketoacidosis  Hx: Uncontrolled type I diabetes mellitus  Will transition off insulin gtt once anion gap closed, serum CO2 >20, and CBG's <200 mg/dL CBG's q1hrs and BMP q4hrs while on insulin gtt  Diabetes coordinator consulted appreciate input   Acute encephalopathy likely secondary to metabolic derangements in the setting of DKA  CT Head/Cervical Spine negative-07/27/2019 Mechanical ventilation pain/discomfort Maintain RASS goal -1 to -2 for now Propofol gtt and prn fentanyl to maintain RASS goal; will discontinue precedex gtt  WUA daily   SUP px: iv protonix  VTE px: subq lovenox Will keep NPO for now while on insulin gtt    Marda Stalker, [place] Pager [phone] (please enter 7 digits) PCCM Consult Pager [phone] (please enter 7 digits)

## 2019-07-27 NOTE — Progress Notes (Signed)
Transported pt to CT and returned to ED on vent without incident. Pt remains on vent and is tol well at this time.

## 2019-07-27 NOTE — ED Triage Notes (Signed)
To ER via [facility] from home with c/o AMS and hyperglycemia. Pt has been lying in bed at home not feeling well over past few days, awaiting COVID test results. Pt CBG is 300's with EMS. Pt oxygen in low 80's with EMS. Pt alert to self, responsive to verbal stimuli. Arrives on NRB. Pt warm to touch, ill appearing.

## 2019-07-28 ENCOUNTER — Inpatient Hospital Stay: Payer: Self-pay

## 2019-07-28 LAB — RESPIRATORY PANEL BY PCR

## 2019-07-28 LAB — GLUCOSE, CAPILLARY
Glucose-Capillary: 114 mg/dL — ABNORMAL HIGH (ref 70–99)
Glucose-Capillary: 122 mg/dL — ABNORMAL HIGH (ref 70–99)
Glucose-Capillary: 123 mg/dL — ABNORMAL HIGH (ref 70–99)
Glucose-Capillary: 130 mg/dL — ABNORMAL HIGH (ref 70–99)
Glucose-Capillary: 165 mg/dL — ABNORMAL HIGH (ref 70–99)
Glucose-Capillary: 167 mg/dL — ABNORMAL HIGH (ref 70–99)
Glucose-Capillary: 170 mg/dL — ABNORMAL HIGH (ref 70–99)
Glucose-Capillary: 234 mg/dL — ABNORMAL HIGH (ref 70–99)
Glucose-Capillary: 246 mg/dL — ABNORMAL HIGH (ref 70–99)
Glucose-Capillary: 271 mg/dL — ABNORMAL HIGH (ref 70–99)
Glucose-Capillary: 361 mg/dL — ABNORMAL HIGH (ref 70–99)
Glucose-Capillary: 93 mg/dL (ref 70–99)
Glucose-Capillary: 97 mg/dL (ref 70–99)
Glucose-Capillary: 98 mg/dL (ref 70–99)

## 2019-07-28 LAB — BASIC METABOLIC PANEL
Anion gap: 10 (ref 5–15)
Anion gap: 9 (ref 5–15)
BUN: 38 mg/dL — ABNORMAL HIGH (ref 6–20)
BUN: 53 mg/dL — ABNORMAL HIGH (ref 6–20)
CO2: 28 mmol/L (ref 22–32)
CO2: 29 mmol/L (ref 22–32)
Calcium: 9.1 mg/dL (ref 8.9–10.3)
Calcium: 9.4 mg/dL (ref 8.9–10.3)
Chloride: 104 mmol/L (ref 98–111)
Chloride: 107 mmol/L (ref 98–111)
Creatinine, Ser: 0.72 mg/dL (ref 0.44–1.00)
Creatinine, Ser: 1.09 mg/dL — ABNORMAL HIGH (ref 0.44–1.00)
GFR calc Af Amer: >60 mL/min (ref >60)
GFR calc Af Amer: >60 mL/min (ref >60)
GFR calc non Af Amer: >60 mL/min (ref >60)
GFR calc non Af Amer: >60 mL/min (ref >60)
Glucose, Bld: 123 mg/dL — ABNORMAL HIGH (ref 70–99)
Glucose, Bld: 289 mg/dL — ABNORMAL HIGH (ref 70–99)
Potassium: 4.3 mmol/L (ref 3.5–5.1)
Potassium: 4.4 mmol/L (ref 3.5–5.1)
Sodium: 143 mmol/L (ref 135–145)
Sodium: 144 mmol/L (ref 135–145)

## 2019-07-28 LAB — CBC
HCT: 35.6 % — ABNORMAL LOW (ref 36.0–46.0)
Hemoglobin: 12 g/dL (ref 12.0–15.0)
MCH: 31.2 pg (ref 26.0–34.0)
MCHC: 33.7 g/dL (ref 30.0–36.0)
MCV: 92.5 fL (ref 80.0–100.0)
Platelets: 184 10*3/uL (ref 150–400)
RBC: 3.85 MIL/uL — ABNORMAL LOW (ref 3.87–5.11)
RDW: 12.8 % (ref 11.5–15.5)
WBC: 4.4 10*3/uL (ref 4.0–10.5)
nRBC: 0 % (ref 0.0–0.2)

## 2019-07-28 LAB — TRIGLYCERIDES: Triglycerides: 81 mg/dL (ref <150)

## 2019-07-28 LAB — PROCALCITONIN: Procalcitonin: 3.75 ng/mL

## 2019-07-28 LAB — LACTIC ACID, PLASMA
Lactic Acid, Venous: 2.1 mmol/L (ref 0.5–1.9)
Lactic Acid, Venous: 2.4 mmol/L (ref 0.5–1.9)

## 2019-07-28 LAB — URINE CULTURE: Culture: NO GROWTH

## 2019-07-28 LAB — INFLUENZA PANEL BY PCR (TYPE A & B)
Influenza A By PCR: NEGATIVE
Influenza B By PCR: NEGATIVE

## 2019-07-28 MED ORDER — INSULIN GLARGINE 100 UNIT/ML ~~LOC~~ SOLN
15.0000 [IU] | Freq: Every day | SUBCUTANEOUS | Status: DC
  Administered 2019-07-28 – 2019-07-30 (×3): 15 [IU] via SUBCUTANEOUS
  Filled 2019-07-28 (×4): qty 0.15

## 2019-07-28 MED ORDER — SODIUM CHLORIDE 0.9 % IV SOLN
500.0000 mg | Freq: Every day | INTRAVENOUS | Status: DC
  Administered 2019-07-28 – 2019-07-30 (×3): 500 mg via INTRAVENOUS
  Filled 2019-07-28 (×4): qty 500

## 2019-07-28 MED ORDER — ADULT MULTIVITAMIN LIQUID CH
15.0000 mL | Freq: Every day | ORAL | Status: DC
  Administered 2019-07-28 – 2019-07-29 (×2): 15 mL
  Filled 2019-07-28 (×2): qty 15

## 2019-07-28 MED ORDER — INSULIN ASPART 100 UNIT/ML ~~LOC~~ SOLN
0.0000 [IU] | SUBCUTANEOUS | Status: DC
  Administered 2019-07-28 (×2): 3 [IU] via SUBCUTANEOUS
  Administered 2019-07-28: 2 [IU] via SUBCUTANEOUS
  Administered 2019-07-29: 3 [IU] via SUBCUTANEOUS
  Administered 2019-07-29: 7 [IU] via SUBCUTANEOUS
  Administered 2019-07-29: 09:00:00 3 [IU] via SUBCUTANEOUS
  Administered 2019-07-29: 13:00:00 5 [IU] via SUBCUTANEOUS
  Administered 2019-07-30: 2 [IU] via SUBCUTANEOUS
  Administered 2019-07-30: 08:00:00 1 [IU] via SUBCUTANEOUS
  Administered 2019-07-30: 2 [IU] via SUBCUTANEOUS
  Administered 2019-07-30: 12:00:00 1 [IU] via SUBCUTANEOUS
  Administered 2019-07-31: 3 [IU] via SUBCUTANEOUS
  Administered 2019-07-31 – 2019-08-01 (×3): 2 [IU] via SUBCUTANEOUS
  Administered 2019-08-01: 3 [IU] via SUBCUTANEOUS
  Administered 2019-08-01: 1 [IU] via SUBCUTANEOUS
  Filled 2019-07-28 (×16): qty 1

## 2019-07-28 MED ORDER — VITAL HIGH PROTEIN PO LIQD
1000.0000 mL | ORAL | Status: DC
  Administered 2019-07-28: 1000 mL

## 2019-07-28 NOTE — Progress Notes (Signed)
Pharmacy Electrolyte Monitoring Consult:  Kristine Allison is a 32 YOF admitted on 07/27/2019 with hyperglycemia and altered mental status. PMH includes: T1DM with recurrent hospitalizations for DKA, asthma, anxiety, and headaches. Pharmacy has been consulted to assist in monitoring and replacing electrolytes.  She is presently intubated. Patient is receiving Vital High Protein 1,000 mL at 45 mL/hr. No MIVF.  Labs:  Sodium (mmol/L)  Date Value  07/28/2019 144   Potassium (mmol/L)  Date Value  07/28/2019 4.3   Magnesium (mg/dL)  Date Value  07/27/2019 3.1 (H)   Phosphorus (mg/dL)  Date Value  07/27/2019 7.4 (H)   Calcium (mg/dL)  Date Value  07/28/2019 9.1   Albumin (g/dL)  Date Value  07/27/2019 4.2   Corrected Calcium: 8.9 mg/dL  Assessment/Plan:  Electrolytes:   - no electrolyte replacement warranted at this time. - per AM ICU rounds, 0.45% NS was discontinued. - will check all electrolytes with AM labs.  Goals:  - K ~ 4  - Mg ~ 2 - P ~ 2.5  Glucose:   - Patient's PTA dose of Lantus 15u daily was restarted inpatient, along with SSI 0-9 units q4h. - Blood glucose is currently being maintained on this regimen. - Will continue to monitor and consult with the diabetes team as needed.  Thank you for allowing pharmacy to be a part of this patient's care.  Raiford Simmonds, PharmD Candidate 07/28/2019 3:30 PM

## 2019-07-28 NOTE — Progress Notes (Signed)
Pharmacy Antibiotic Note  Kristine Allison Annick Dimaio is a 38 y.o. female admitted on 07/27/2019 with pneumonia.  Pharmacy has been consulted for Unasyn dosing. Patient also to receive Azithromycin x 5 days for additional atypical coverage.   Plan: Unasyn 3 gm IV Q6H  Azithromycin 500 mg IV Q24H  Height: 5\' 4"  (162.6 cm) Weight: 139 lb (63 kg) IBW/kg (Calculated) : 54.7  Temp (24hrs), Avg:98.5 F (36.9 C), Min:97.8 F (36.6 C), Max:99.8 F (37.7 C)  Recent Labs  Lab 07/27/19 1446 07/27/19 2038 07/27/19 2356 07/28/19 0452  WBC 7.6  --   --  4.4  CREATININE 1.77* 1.52* 1.09* 0.72  LATICACIDVEN 2.4*  2.4* 2.4* 2.1*  --     Estimated Creatinine Clearance: 83.9 mL/min (by C-G formula based on SCr of 0.72 mg/dL).    Allergies  Allergen Reactions  . Latex Rash    Antimicrobials this admission:  Unasyn 11/4 >>   Azithromycin 11/5 >> 11/9  Microbiology results: 11/4: BCx: no growth x 24 hours 11/4: UCx: no growth (final) 11/4: Sputum Culture: abundant GNR, moderate GPC in pairs. 11/4: MRSA PCR: NEGATIVE 11/4: SARS-CoV-2: NEGATIVE  Thank you for allowing pharmacy to be a part of this patient's care.  Raiford Simmonds 07/28/2019 3:22 PM

## 2019-07-28 NOTE — Progress Notes (Signed)
Inpatient Diabetes Program Recommendations  AACE/ADA: New Consensus Statement on Inpatient Glycemic Control   Target Ranges:  Prepandial:   less than 140 mg/dL      Peak postprandial:   less than 180 mg/dL (1-2 hours)      Critically ill patients:  140 - 180 mg/dL   Results for KENNADEE, WALTHOUR (MRN [id number]) as of 07/28/2019 07:47  Ref. Range 07/28/2019 00:30 07/28/2019 01:31 07/28/2019 01:32 07/28/2019 02:37 07/28/2019 03:34 07/28/2019 04:29 07/28/2019 05:29 07/28/2019 06:38 07/28/2019 07:32  Glucose-Capillary Latest Ref Range: 70 - 99 mg/dL 271 (H) 165 (H) 167 (H) 130 (H) 123 (H) 122 (H) 114 (H) 97 98  Results for SUNITA, DEMOND (MRN [id number]) as of 07/28/2019 07:47  Ref. Range 07/27/2019 14:46 07/27/2019 16:19 07/27/2019 17:24 07/27/2019 18:50 07/27/2019 20:22 07/27/2019 21:27 07/27/2019 22:29 07/27/2019 23:33  Glucose-Capillary Latest Ref Range: 70 - 99 mg/dL >600 (HH) >600 (HH) >600 (HH) 528 (HH) 483 (H) 382 (H) 388 (H) 361 (H)  Results for MACIAH, FEEBACK (MRN [id number]) as of 07/28/2019 07:47  Ref. Range 01/14/2019 05:05 05/06/2019 10:27  Hemoglobin A1C Latest Ref Range: 4.8 - 5.6 % 10.9 (H) 9.9 (H)   Review of Glycemic Control  Diabetes history: DM1 (makes NO insulin; requires basal, correction, and meal coverage insulin) Outpatient Diabetes medications: Basaglar 15 units QPM, Humalog 4 units TID with meals Current orders for Inpatient glycemic control: Lantus 15 units daily, Novolog 0-9 units Q4H   NOTE: Noted consult for Diabetes Coordinator. Patient is well known to Inpatient Diabetes team as she has had multiple admissions for DKA. Patient has Type 1 DM and no insurance.  Last hospital admission 05/08/19 to 05/10/19 patient was provided with Basaglar and Humalog insulin pens from [facility], CM provided glucometer and testing supplies, and patient was encouraged to go to [facility] to establish care. Diabetes Coordinators have spoken with patient on several occassions  during hospital admissions (last spoken with on 05/06/19). Patient admitted on 111/4/20 with DKA and was initially treated with IV insulin which has been transitioned to SQ insulin (received Lantus 15 units at 3:48 am on 07/28/19). Agree with current insulin orders at this time. Will continue to follow along while inpatient and make recommendations if needed.  Thanks, Barnie Alderman, RN, MSN, CDE Diabetes Coordinator Inpatient Diabetes Program [phone] (Team Pager from 8am to 5pm)

## 2019-07-28 NOTE — Progress Notes (Signed)
Initial Nutrition Assessment  DOCUMENTATION CODES:   Non-severe (moderate) malnutrition in context of social or environmental circumstances  INTERVENTION:  Initiate Vital High Protein at 15 mL/hr and advance by 15 mL/hr every 8 hours to goal rate of 45 mL/hr (1080 mL goal daily volume). Provides 1080 kcal, 95 grams of protein, 907 mL H2O daily. With current propofol rate provides 1579 kcal (99% estimated kcal needs).  Provide liquid MVI daily per tube to meet 100% RDIs for vitamins/minerals.  Provide minimum free water flush of 30 mL Q4hrs.  Monitor magnesium, potassium, and phosphorus daily for at least 3 days, MD to replete as needed, as pt is at risk for refeeding syndrome given moderate malnutrition.  NUTRITION DIAGNOSIS:   Moderate Malnutrition related to social / environmental circumstances(inadequate oral intake) as evidenced by moderate fat depletion, moderate muscle depletion.  GOAL:   Provide needs based on ASPEN/SCCM guidelines  MONITOR:   Vent status, Labs, Weight trends, TF tolerance, I & O's  REASON FOR ASSESSMENT:   Ventilator    ASSESSMENT:   38 year old female with PMHx of asthma, DM, headaches, anxiety admitted with acute encephalopathy and acute hypoxic respiratory failure secondary to possible aspiration PNA requiring mechanical intubation on 11/4, also with DKA, acute renal failure.   Patient intubated and sedated. On PRVC mode with FiO2 40% and PEEP 5 cmH2O. Abdomen soft. Last BM unknown/PTA. Patient known to this RD from previous admissions. She continues to meet criteria for moderate chronic malnutrition. Discussed with RN and on rounds. Plan is to initiate tube feeds today.  Enteral Access: 18 Fr. OGT placed 11/4; terminates in gastric body per abdominal x-ray 11/4; 58 cm at corner of mouth  MAP: 72-86 mmHg  Patient is currently intubated on ventilator support Ve: 10.5 L/min Temp (24hrs), Avg:98.1 F (36.7 C), Min:97.8 F (36.6 C), Max:98.9 F  (37.2 C)  Propofol: 18.9 mL/hr (499 kcal daily)  Medications reviewed and include: Novolog 0-9 units Q4hrs, Lantus 15 units daily, pantoprazole, Unasyn, azithromycin, propofol gtt.  Labs reviewed: CBG 97->600, BUN 38.  I/O: CBG 1600  NUTRITION - FOCUSED PHYSICAL EXAM:    Most Recent Value  Orbital Region  Moderate depletion  Upper Arm Region  Moderate depletion  Thoracic and Lumbar Region  Mild depletion  Buccal Region  Unable to assess  Temple Region  Moderate depletion  Clavicle Bone Region  Moderate depletion  Clavicle and Acromion Bone Region  Moderate depletion  Scapular Bone Region  Unable to assess  Dorsal Hand  Unable to assess  Patellar Region  Moderate depletion  Anterior Thigh Region  Moderate depletion  Posterior Calf Region  Moderate depletion  Edema (RD Assessment)  None  Hair  Reviewed [alopecia]  Eyes  Unable to assess  Mouth  Unable to assess  Skin  Reviewed  Nails  Reviewed     Diet Order:   Diet Order            Diet NPO time specified  Diet effective now             EDUCATION NEEDS:   No education needs have been identified at this time  Skin:  Skin Assessment: Reviewed RN Assessment(ecchymosis)  Last BM:  unknown/PTA  Height:   Ht Readings from Last 1 Encounters:  07/27/19 5\' 4"  (1.626 m)   Weight:   Wt Readings from Last 1 Encounters:  07/27/19 63 kg   Ideal Body Weight:  54.5 kg  BMI:  Body mass index is 23.86 kg/m.  Estimated Nutritional Needs:   Kcal:  1582 (PSU 2003b w/ MSJ 1308, Ve 10.5, Tmax 37.2)  Protein:  81-95 grams (1.3-1.5 grams/kg)  Fluid:  1.8-2.2 L/day  Jacklynn Barnacle, MS, RD, LDN Office: [phone] Pager: [phone] After Hours/Weekend Pager: [phone]

## 2019-07-28 NOTE — Plan of Care (Signed)
  Problem: Metabolic: Goal: Ability to maintain appropriate glucose levels will improve Outcome: Progressing  Insulin gtt stopped and transitioned to SQ  Problem: Respiratory: Goal: Will regain and/or maintain adequate ventilation Outcome: Progressing   Problem: Urinary Elimination: Goal: Ability to achieve and maintain adequate renal perfusion and functioning will improve Outcome: Progressing   Problem: Education: Goal: Ability to describe self-care measures that may prevent or decrease complications (Diabetes Survival Skills Education) will improve Outcome: Not Met (add Reason) Patient is sedated and intubated, education not applicable at this time Goal: Individualized Educational Video(s) Outcome: Not Met (add Reason)   Problem: Fluid Volume: Goal: Ability to achieve a balanced intake and output will improve Outcome: Not Met (add Reason) Patient is intubated, unable to take in POs at this time. Only IV fluids

## 2019-07-29 LAB — BASIC METABOLIC PANEL
Anion gap: 12 (ref 5–15)
BUN: 21 mg/dL — ABNORMAL HIGH (ref 6–20)
CO2: 25 mmol/L (ref 22–32)
Calcium: 8.2 mg/dL — ABNORMAL LOW (ref 8.9–10.3)
Chloride: 105 mmol/L (ref 98–111)
Creatinine, Ser: 0.68 mg/dL (ref 0.44–1.00)
GFR calc Af Amer: >60 mL/min (ref >60)
GFR calc non Af Amer: >60 mL/min (ref >60)
Glucose, Bld: 328 mg/dL — ABNORMAL HIGH (ref 70–99)
Potassium: 3.9 mmol/L (ref 3.5–5.1)
Sodium: 142 mmol/L (ref 135–145)

## 2019-07-29 LAB — PROCALCITONIN: Procalcitonin: 1.76 ng/mL

## 2019-07-29 LAB — TRIGLYCERIDES: Triglycerides: 138 mg/dL (ref <150)

## 2019-07-29 LAB — GLUCOSE, CAPILLARY
Glucose-Capillary: 325 mg/dL — ABNORMAL HIGH (ref 70–99)
Glucose-Capillary: 244 mg/dL — ABNORMAL HIGH (ref 70–99)
Glucose-Capillary: 292 mg/dL — ABNORMAL HIGH (ref 70–99)
Glucose-Capillary: 89 mg/dL (ref 70–99)
Glucose-Capillary: 53 mg/dL — ABNORMAL LOW (ref 70–99)
Glucose-Capillary: 156 mg/dL — ABNORMAL HIGH (ref 70–99)
Glucose-Capillary: 213 mg/dL — ABNORMAL HIGH (ref 70–99)

## 2019-07-29 LAB — MAGNESIUM: Magnesium: 2.2 mg/dL (ref 1.7–2.4)

## 2019-07-29 LAB — PHOSPHORUS: Phosphorus: 1.8 mg/dL — ABNORMAL LOW (ref 2.5–4.6)

## 2019-07-29 MED ORDER — ONDANSETRON HCL 4 MG/2ML IJ SOLN
4.0000 mg | Freq: Four times a day (QID) | INTRAMUSCULAR | Status: DC | PRN
  Administered 2019-07-29 – 2019-07-30 (×3): 4 mg via INTRAVENOUS
  Filled 2019-07-29 (×3): qty 2

## 2019-07-29 MED ORDER — INSULIN ASPART 100 UNIT/ML ~~LOC~~ SOLN
4.0000 [IU] | SUBCUTANEOUS | Status: DC
  Administered 2019-07-29: 13:00:00 4 [IU] via SUBCUTANEOUS
  Filled 2019-07-29: qty 1

## 2019-07-29 MED ORDER — PANTOPRAZOLE SODIUM 40 MG PO PACK: 40.0000 mg | PACK | Freq: Every day | ORAL | Status: DC

## 2019-07-29 MED ORDER — ENSURE ENLIVE PO LIQD
237.0000 mL | Freq: Two times a day (BID) | ORAL | Status: DC
  Administered 2019-07-30: 11:00:00 237 mL via ORAL

## 2019-07-29 MED ORDER — PANTOPRAZOLE SODIUM 40 MG PO TBEC
40.0000 mg | DELAYED_RELEASE_TABLET | Freq: Every day | ORAL | Status: DC
  Administered 2019-07-29 – 2019-08-01 (×4): 40 mg via ORAL
  Filled 2019-07-29 (×4): qty 1

## 2019-07-29 MED ORDER — SODIUM PHOSPHATES 45 MMOLE/15ML IV SOLN
30.0000 mmol | Freq: Once | INTRAVENOUS | Status: AC
  Administered 2019-07-29: 30 mmol via INTRAVENOUS
  Filled 2019-07-29: qty 10

## 2019-07-29 MED ORDER — METHYLPREDNISOLONE SODIUM SUCC 125 MG IJ SOLR
60.0000 mg | Freq: Once | INTRAMUSCULAR | Status: AC
  Administered 2019-07-29: 13:00:00 60 mg via INTRAVENOUS
  Filled 2019-07-29: qty 2

## 2019-07-29 MED ORDER — ADULT MULTIVITAMIN W/MINERALS CH
1.0000 | ORAL_TABLET | Freq: Every day | ORAL | Status: DC
  Administered 2019-07-30 – 2019-08-01 (×3): 1 via ORAL
  Filled 2019-07-29 (×3): qty 1

## 2019-07-29 MED ORDER — INSULIN GLARGINE 100 UNIT/ML ~~LOC~~ SOLN
5.0000 [IU] | Freq: Once | SUBCUTANEOUS | Status: DC
  Filled 2019-07-29: qty 0.05

## 2019-07-29 NOTE — Progress Notes (Signed)
Patient alert and oriented, given orange juice and graham crackers for hypoglycemia, will recheck blood sugars.

## 2019-07-29 NOTE — Consult Note (Signed)
Name: Kristine Allison MRN: [id number] DOB: December 15, 1980    ADMISSION DATE:  07/27/2019 CONSULTATION DATE: 07/27/2019  REFERRING MD : Dr. Flossie Buffy   CHIEF COMPLAINT: Altered Mental Status   BRIEF PATIENT DESCRIPTION:  38 yo female admitted with acute encephalopathy and acute hypoxic respiratory failure secondary to possible aspiration pneumonia and anion gap metabolic acidosis in the setting of DKA requiring insulin gtt and mechanical intubation   SIGNIFICANT EVENTS/STUDIES:  11/4: Pt admitted to ICU with DKA mechanically intubated 11/4: CT Head/Cervical Spine revealed no evidence of acute intracranial abnormality and no acute fracture or malalignment of the cervical spine 11/6- extubated from MV  HISTORY OF PRESENT ILLNESS:   This is a 38 yo female with a PMH of Headache, Type I Diabetes Mellitus, Frequent Hospitalization secondary to DKA, Malnutrition of Moderate Degree, Anxiety, and Asthma. She presented to [facility] ER via EMS on 11/4 with altered mental status and hyperglycemia.  Per ER notes it was reported pt developed cough and c/o not feeling well 3 days prior to presentation and awaiting COVID-19 test results.  EMS reported upon arrival at pts home pt alert to self only, responsive to verbal stimulation, confused, CBG reading in the 300's, and hypoxic with O2 sats in the low 80's requiring NRB.  In the ER pt hypotensive and tachycardic, therefore she received 3L fluid boluses.  CXR and COVID-19 negative.  Lab results revealed Na+ 132, K+ 6.9, chloride 91, CO2 15, glucose 798, BUN 69, creatinine 1.77, anion 26, lactic acid 2.4, beta- hydroxybutyric acid >8.00, acetaminophen level <66, salicylate level <4.4, urine drug screen negative, UA negative for UTI, and abg pH 7.07/pCO2 51/pO2 73/acid-base deficit 15.3/bicarb 14.8. She received 1 amp of bicarb and insulin gtt initiated due to DKA. CT Head/Cervical Spine ordered and during transport to CT pt developed worsening hypoxia with O2 sats 50% on  6L and some respiratory depression subsequently requiring mechanical intubation.  CT Head/Cervical Spine resuilts negative. She was subsequently admitted to ICU per hospitalist team for additional workup and treatment   PAST MEDICAL HISTORY :   has a past medical history of Anxiety, Asthma, Diabetes mellitus without complication ([place]), and IHKVQQVZ(563.8).  has a past surgical history that includes No past surgeries. Prior to Admission medications   Medication Sig Start Date End Date Taking? Authorizing Provider  Ensure Max Protein (ENSURE MAX PROTEIN) LIQD Take 330 mLs (11 oz total) by mouth 2 (two) times daily between meals. 05/10/19   Loletha Grayer, MD  Insulin Glargine (BASAGLAR KWIKPEN) 100 UNIT/ML SOPN Inject 0.15 mLs (15 Units total) into the skin every evening. 05/10/19   Loletha Grayer, MD  insulin lispro (HUMALOG KWIKPEN) 100 UNIT/ML KwikPen Inject 0.04 mLs (4 Units total) into the skin 3 (three) times daily. 05/10/19   Loletha Grayer, MD  ondansetron (ZOFRAN) 4 MG tablet Take 1 tablet (4 mg total) by mouth every 6 (six) hours as needed for nausea. 01/18/19   Vaughan Basta, MD   Allergies  Allergen Reactions  . Latex Rash    FAMILY HISTORY:  family history includes Heart disease in an other family member. SOCIAL HISTORY:  reports that she has been smoking cigarettes. She has a 5.00 pack-year smoking history. She has never used smokeless tobacco. She reports that she does not drink alcohol or use drugs.  REVIEW OF SYSTEMS:   Unable to assess pt intubated   SUBJECTIVE:  Unable to assess pt intubated   VITAL SIGNS: Temp:  [98.1 F (36.7 C)-99.4 F (37.4 C)] 98.2  F (36.8 C) (11/06 1700) Pulse Rate:  [66-104] 73 (11/06 1700) Resp:  [13-25] 13 (11/06 1700) BP: (93-136)/(59-93) 121/88 (11/06 1700) SpO2:  [91 %-100 %] 98 % (11/06 1700) FiO2 (%):  [35 %-40 %] 35 % (11/06 1120) Weight:  [53.5 kg] 53.5 kg (11/06 0443)  PHYSICAL EXAMINATION: General: acutely ill  appearing female, NAD mechanically intubated  Neuro: sedated, not following commands, PERRL  HEENT: supple, no JVD  Cardiovascular: nsr, rrr, no R/G  Lungs: rhonchi throughout, slightly tachypneic  Abdomen: +BS x4, soft, non tender, non distended  Musculoskeletal: normal bulk and tone, no edema  Skin: intact no rashes or lesions present   Recent Labs  Lab 07/27/19 2356 07/28/19 0452 07/29/19 0505  NA 143 144 142  K 4.4 4.3 3.9  CL 104 107 105  CO2 29 28 25   BUN 53* 38* 21*  CREATININE 1.09* 0.72 0.68  GLUCOSE 289* 123* 328*   Recent Labs  Lab 07/27/19 1446 07/28/19 0452  HGB 13.5 12.0  HCT 43.7 35.6*  WBC 7.6 4.4  PLT 272 184   Ct Head Wo Contrast  Result Date: 07/27/2019 CLINICAL DATA:  38 year old female with altered mental status, presumed DKA EXAM: CT HEAD WITHOUT CONTRAST CT CERVICAL SPINE WITHOUT CONTRAST TECHNIQUE: Multidetector CT imaging of the head and cervical spine was performed following the standard protocol without intravenous contrast. Multiplanar CT image reconstructions of the cervical spine were also generated. COMPARISON:  None. FINDINGS: CT HEAD FINDINGS Brain: No acute intracranial hemorrhage. No midline shift or mass effect. Gray-white differentiation maintained. Unremarkable appearance of the ventricular system. Vascular: Unremarkable. Skull: No acute fracture.  No aggressive bone lesion identified. Sinuses/Orbits: Unremarkable appearance of the orbits. Mastoid air cells clear. No middle ear effusion. No significant sinus disease. Other: None CT CERVICAL SPINE FINDINGS Alignment: Craniocervical junction aligned. Anatomic alignment of the cervical elements. No subluxation. Skull base and vertebrae: No acute fracture at the skullbase. Vertebral body heights relatively maintained. No acute fracture identified. Soft tissues and spinal canal: Unremarkable cervical soft tissues. Lymph nodes are present, though not enlarged. Disc levels: Unremarkable appearance of  disc space, which are maintained. Upper chest: Unremarkable appearance of the lung apices. Other: No bony canal narrowing. IMPRESSION: Head CT: No CT evidence of acute intracranial abnormality. Cervical CT: No acute fracture or malalignment of the cervical spine. Electronically Signed   By: Gilmer MorJaime  Wagner D.O.   On: 07/27/2019 18:44   Ct Cervical Spine Wo Contrast  Result Date: 07/27/2019 CLINICAL DATA:  38 year old female with altered mental status, presumed DKA EXAM: CT HEAD WITHOUT CONTRAST CT CERVICAL SPINE WITHOUT CONTRAST TECHNIQUE: Multidetector CT imaging of the head and cervical spine was performed following the standard protocol without intravenous contrast. Multiplanar CT image reconstructions of the cervical spine were also generated. COMPARISON:  None. FINDINGS: CT HEAD FINDINGS Brain: No acute intracranial hemorrhage. No midline shift or mass effect. Gray-white differentiation maintained. Unremarkable appearance of the ventricular system. Vascular: Unremarkable. Skull: No acute fracture.  No aggressive bone lesion identified. Sinuses/Orbits: Unremarkable appearance of the orbits. Mastoid air cells clear. No middle ear effusion. No significant sinus disease. Other: None CT CERVICAL SPINE FINDINGS Alignment: Craniocervical junction aligned. Anatomic alignment of the cervical elements. No subluxation. Skull base and vertebrae: No acute fracture at the skullbase. Vertebral body heights relatively maintained. No acute fracture identified. Soft tissues and spinal canal: Unremarkable cervical soft tissues. Lymph nodes are present, though not enlarged. Disc levels: Unremarkable appearance of disc space, which are maintained. Upper chest: Unremarkable appearance  of the lung apices. Other: No bony canal narrowing. IMPRESSION: Head CT: No CT evidence of acute intracranial abnormality. Cervical CT: No acute fracture or malalignment of the cervical spine. Electronically Signed   By: Gilmer Mor D.O.   On:  07/27/2019 18:44   Dg Chest Port 1 View  Result Date: 07/28/2019 CLINICAL DATA:  38 year old female with encephalopathy. EXAM: PORTABLE CHEST 1 VIEW COMPARISON:  Chest radiograph dated 07/27/2019 FINDINGS: Endotracheal tube approximately 2 cm above the carina an enteric tube extends below the diaphragm with tip beyond the inferior margin of the image. No focal consolidation, pleural effusion, or pneumothorax. The cardiac silhouette is within normal limits. No acute osseous pathology. IMPRESSION: No interval change.  Stable positioning of the support apparatus. Electronically Signed   By: Elgie Collard M.D.   On: 07/28/2019 10:10    ASSESSMENT / PLAN:  Acute hypoxic respiratory failure secondary to anion gap metabolic acidosis in the setting of DKA and possible aspiration  Mechanical intubation  Hx: Asthma  S/p liberation from MV -changing unasyn to augmentin to finihs 5 day course abx for asp pneumonia   - influenza panel by pcr   Hypotension  resolved  Acute renal failure  resolved  Diabetic ketoacidosis  -resolved  Acute encephalopathy  Resolved   SUP px: iv protonix  VTE px: subq lovenox  Restart PO diet diabetic   Critical care provider statement:    Critical care time (minutes):  32    Critical care time was exclusive of:  Separately billable procedures and  treating other patients   Critical care was necessary to treat or prevent imminent or  life-threatening deterioration of the following conditions:   Acute hypoxemic respiratory failure status post extubation from mechanical ventilation, DKA, hypotension, multiple comorbid conditions   Critical care was time spent personally by me on the following  activities:  Development of treatment plan with patient or surrogate,  discussions with consultants, evaluation of patient's response to  treatment, examination of patient, obtaining history from patient or  surrogate, ordering and performing treatments and  interventions, ordering  and review of laboratory studies and re-evaluation of patient's condition   I assumed direction of critical care for this patient from another  provider in my specialty: no      Vida Rigger, M.D.  Pulmonary & Critical Care Medicine  [facility]

## 2019-07-29 NOTE — Progress Notes (Signed)
Pharmacy Electrolyte Monitoring Consult:  Kristine Allison is a 68 YOF admitted on 07/27/2019 with hyperglycemia and altered mental status. PMH includes: T1DM with recurrent hospitalizations for DKA, asthma, anxiety, and headaches. Pharmacy has been consulted to assist in monitoring and replacing electrolytes.  She is presently intubated. Patient is receiving Ensure Enlive 237 mL BID.   Labs:  Sodium (mmol/L)  Date Value  07/29/2019 142   Potassium (mmol/L)  Date Value  07/29/2019 3.9   Magnesium (mg/dL)  Date Value  07/29/2019 2.2   Phosphorus (mg/dL)  Date Value  07/29/2019 1.8 (L)   Calcium (mg/dL)  Date Value  07/29/2019 8.2 (L)   Albumin (g/dL)  Date Value  07/27/2019 4.2   Corrected Calcium: 8.0 mg/dL  Assessment/Plan:  Electrolytes:   - Patient received sodium phosphate 30 mmol in dextrose 5%. - No further replacement needed for other electrolytes. - Will check all electrolytes with AM labs.  Goals:  - K ~ 4  - Mg ~ 2 - P ~ 2.5  Glucose:   - 11/5: Patient's PTA dose of Lantus 15u daily was resumed inpatient, along with SSI 0-9 units q4h. - 11/6: Novolog 4 units q4h was added for additional carbohydrate coverage from tube feeds. - Blood glucose is currently being maintained on this regimen. - Will continue to monitor and consult with the diabetes team as needed.  Thank you for allowing pharmacy to be a part of this patient's care.  Raiford Simmonds, PharmD Candidate 07/29/2019 4:23 PM

## 2019-07-29 NOTE — Progress Notes (Signed)
Pharmacy Antibiotic Note  Kristine Allison Mauricia Mertens is a 38 y.o. female admitted on 07/27/2019 with pneumonia.  Pharmacy has been consulted for Unasyn dosing. Patient is receiving a 5-day course of Azithromycin for additional atypical coverage.   Plan: Unasyn 3 gm IV Q6H  Azithromycin 500 mg IV Q24H  Height: 5\' 4"  (162.6 cm) Weight: 117 lb 15.1 oz (53.5 kg) IBW/kg (Calculated) : 54.7  Temp (24hrs), Avg:99.1 F (37.3 C), Min:98.6 F (37 C), Max:99.4 F (37.4 C)  Recent Labs  Lab 07/27/19 1446 07/27/19 2038 07/27/19 2356 07/28/19 0452 07/29/19 0505  WBC 7.6  --   --  4.4  --   CREATININE 1.77* 1.52* 1.09* 0.72 0.68  LATICACIDVEN 2.4*  2.4* 2.4* 2.1*  --   --     Estimated Creatinine Clearance: 82.1 mL/min (by C-G formula based on SCr of 0.68 mg/dL).    Allergies  Allergen Reactions  . Latex Rash    Antimicrobials this admission:  Unasyn 11/4 >>   Azithromycin 11/5 >> 11/9  Microbiology results: 11/4: BCx: no growth x 48 hours 11/4: UCx: no growth (final) 11/4: Sputum Culture: abundant GNR, moderate GPC in pairs. 11/4: MRSA PCR: NEGATIVE 11/4: SARS-CoV-2: NEGATIVE  Thank you for allowing pharmacy to be a part of this patient's care.  Raiford Simmonds 07/29/2019 4:18 PM

## 2019-07-29 NOTE — Progress Notes (Signed)
Pt was suctioned prior to extubation for a small amount of white secretions. Per Dr. Teodoro Kil order, she was extubated to a 3L nasal cannula. Stridor was heard after extubation although she denied SOB. She was given IV solumedrol.

## 2019-07-29 NOTE — Progress Notes (Addendum)
Inpatient Diabetes Program Recommendations  AACE/ADA: New Consensus Statement on Inpatient Glycemic Control (2015)  Target Ranges:  Prepandial:   less than 140 mg/dL      Peak postprandial:   less than 180 mg/dL (1-2 hours)      Critically ill patients:  140 - 180 mg/dL   Results for Kristine Allison, Kristine Allison (MRN [id number]) as of 07/29/2019 07:11  Ref. Range 07/28/2019 03:34 07/28/2019 04:29 07/28/2019 05:29 07/28/2019 06:38 07/28/2019 07:32 07/28/2019 11:36 07/28/2019 15:29 07/28/2019 19:45  Glucose-Capillary Latest Ref Range: 70 - 99 mg/dL 123 (H)   15 units LANTUS 122 (H) 114 (H) 97 98 93 170 (H)  2 units NOVOLOG  234 (H)  3 units NOVOLOG    Results for Kristine Allison, Kristine Allison (MRN [id number]) as of 07/29/2019 07:11  Ref. Range 07/28/2019 23:33 07/29/2019 04:31  Glucose-Capillary Latest Ref Range: 70 - 99 mg/dL 246 (H)  3 units NOVOLOG  325 (H)  7 units NOVOLOG     Admit with: DKA (makes NO insulin; requires basal, correction, and meal coverage insulin)  History: Type 1 Diabetes  Home DM Meds: Basaglar 15 units QPM       Humalog 4 units TID with meals  Current Orders: Lantus 15 units Daily      Novolog Sensitive Correction Scale/ SSI (0-9 units) Q4 hours    Vital HP tube feeds 45cc/hr started yesterday at 1pm.   MD- Please consider the following in-hospital insulin adjustments:  Start Novolog tube feed coverage to cover the carbohydrates that patient is receiving in the tube feeds:  Novolog 4 units Q4 hours  Add the following parameter: "HOLD if tube feeds HELD for any reason"    Patient is well known to Inpatient Diabetes team as she has had multiple admissions for DKA. Patient has Type 1 DM and no insurance.  Last hospital admission 05/08/19 to 05/10/19 patient was provided with Basaglar and Humalog insulin pens from [facility], CM provided glucometer and testing supplies, and patient was encouraged to go to [facility] to establish care. Diabetes Coordinators  have spoken with patient on several occassions during hospital admissions (last spoken with on 05/06/19). Patient admitted on 07/27/19 with DKA and was initially treated with IV insulin which has been transitioned to SQ insulin (received Lantus 15 units at 3:48 am on 07/28/19).      --Will follow patient during hospitalization--  Wyn Quaker RN, MSN, CDE Diabetes Coordinator Inpatient Glycemic Control Team Team Pager: [phone] (8a-5p)

## 2019-07-30 LAB — BASIC METABOLIC PANEL
Anion gap: 10 (ref 5–15)
Anion gap: 10 (ref 5–15)
BUN: 18 mg/dL (ref 6–20)
BUN: 18 mg/dL (ref 6–20)
CO2: 26 mmol/L (ref 22–32)
CO2: 28 mmol/L (ref 22–32)
Calcium: 7.9 mg/dL — ABNORMAL LOW (ref 8.9–10.3)
Calcium: 7.9 mg/dL — ABNORMAL LOW (ref 8.9–10.3)
Chloride: 100 mmol/L (ref 98–111)
Chloride: 101 mmol/L (ref 98–111)
Creatinine, Ser: 0.52 mg/dL (ref 0.44–1.00)
Creatinine, Ser: 0.54 mg/dL (ref 0.44–1.00)
GFR calc Af Amer: >60 mL/min (ref >60)
GFR calc Af Amer: >60 mL/min (ref >60)
GFR calc non Af Amer: >60 mL/min (ref >60)
GFR calc non Af Amer: >60 mL/min (ref >60)
Glucose, Bld: 176 mg/dL — ABNORMAL HIGH (ref 70–99)
Glucose, Bld: 223 mg/dL — ABNORMAL HIGH (ref 70–99)
Potassium: 3.8 mmol/L (ref 3.5–5.1)
Potassium: 4.1 mmol/L (ref 3.5–5.1)
Sodium: 137 mmol/L (ref 135–145)
Sodium: 138 mmol/L (ref 135–145)

## 2019-07-30 LAB — PHOSPHORUS
Phosphorus: 2.7 mg/dL (ref 2.5–4.6)
Phosphorus: 4 mg/dL (ref 2.5–4.6)

## 2019-07-30 LAB — GLUCOSE, CAPILLARY
Glucose-Capillary: 103 mg/dL — ABNORMAL HIGH (ref 70–99)
Glucose-Capillary: 109 mg/dL — ABNORMAL HIGH (ref 70–99)
Glucose-Capillary: 127 mg/dL — ABNORMAL HIGH (ref 70–99)
Glucose-Capillary: 134 mg/dL — ABNORMAL HIGH (ref 70–99)
Glucose-Capillary: 167 mg/dL — ABNORMAL HIGH (ref 70–99)
Glucose-Capillary: 189 mg/dL — ABNORMAL HIGH (ref 70–99)

## 2019-07-30 LAB — MAGNESIUM
Magnesium: 2.1 mg/dL (ref 1.7–2.4)
Magnesium: 2.1 mg/dL (ref 1.7–2.4)

## 2019-07-30 LAB — LACTIC ACID, PLASMA: Lactic Acid, Venous: 0.9 mmol/L (ref 0.5–1.9)

## 2019-07-30 MED ORDER — OXYCODONE-ACETAMINOPHEN 5-325 MG PO TABS
1.0000 | ORAL_TABLET | Freq: Four times a day (QID) | ORAL | Status: DC | PRN
  Administered 2019-07-30 – 2019-08-01 (×5): 1 via ORAL
  Filled 2019-07-30 (×5): qty 1

## 2019-07-30 MED ORDER — SODIUM CHLORIDE 0.9 % IV SOLN
INTRAVENOUS | Status: DC | PRN
  Administered 2019-07-30: 10 mL via INTRAVENOUS
  Administered 2019-07-31 – 2019-08-01 (×3): 1000 mL via INTRAVENOUS

## 2019-07-30 NOTE — Progress Notes (Signed)
Pt A&O resting comfortably. Denies having pain or SOB at this time. Pt HR 40's -50's asymptomatic. Will continue to monitor.

## 2019-07-30 NOTE — Progress Notes (Signed)
Pharmacy Electrolyte Monitoring Consult:  Kristine Allison is a 82 YOF admitted on 07/27/2019 with hyperglycemia and altered mental status. PMH includes: T1DM with recurrent hospitalizations for DKA, asthma, anxiety, and headaches. Pharmacy has been consulted to assist in monitoring and replacing electrolytes.  Extubated 11/6.   Labs:  Sodium (mmol/L)  Date Value  07/30/2019 138   Potassium (mmol/L)  Date Value  07/30/2019 3.8   Magnesium (mg/dL)  Date Value  07/30/2019 2.1   Phosphorus (mg/dL)  Date Value  07/30/2019 2.7   Calcium (mg/dL)  Date Value  07/30/2019 7.9 (L)   Albumin (g/dL)  Date Value  07/27/2019 4.2   Corrected Calcium: 7.7 mg/dL  Assessment/Plan:  Electrolytes:  - no electrolyte replacement warranted at this time. - pt on MVI - will check electrolytes with AM labs.  Goals:  - K ~ 4  - Mg ~ 2 - P ~ 2.5  Glucose:  - Patient's PTA dose of Lantus 15u daily was restarted inpatient, along with SSI 0-9 units q4h. - Blood glucose is currently being maintained on this regimen. - Will continue to monitor and consult with the diabetes team as needed.  LBM 11/6.   Thank you for allowing pharmacy to be a part of this patient's care.  Oswald Hillock, PharmD, BCPS 07/30/2019 7:55 AM

## 2019-07-30 NOTE — Consult Note (Signed)
Name: Kristine Allison MRN: [id number] DOB: 1981-03-09    ADMISSION DATE:  07/27/2019 CONSULTATION DATE: 07/27/2019  REFERRING MD : Dr. Cyndia Bent   CHIEF COMPLAINT: Altered Mental Status   BRIEF PATIENT DESCRIPTION:  38 yo female admitted with acute encephalopathy and acute hypoxic respiratory failure secondary to possible aspiration pneumonia and anion gap metabolic acidosis in the setting of DKA requiring insulin gtt and mechanical intubation   SIGNIFICANT EVENTS/STUDIES:  11/4: Pt admitted to ICU with DKA mechanically intubated 11/4: CT Head/Cervical Spine revealed no evidence of acute intracranial abnormality and no acute fracture or malalignment of the cervical spine 11/6- extubated from MV 11/7- pt optimized for transfer to medical floor  HISTORY OF PRESENT ILLNESS:   This is a 38 yo female with a PMH of Headache, Type I Diabetes Mellitus, Frequent Hospitalization secondary to DKA, Malnutrition of Moderate Degree, Anxiety, and Asthma. She presented to [facility] ER via EMS on 11/4 with altered mental status and hyperglycemia.  Per ER notes it was reported pt developed cough and c/o not feeling well 3 days prior to presentation and awaiting COVID-19 test results.  EMS reported upon arrival at pts home pt alert to self only, responsive to verbal stimulation, confused, CBG reading in the 300's, and hypoxic with O2 sats in the low 80's requiring NRB.  In the ER pt hypotensive and tachycardic, therefore she received 3L fluid boluses.  CXR and COVID-19 negative.  Lab results revealed Na+ 132, K+ 6.9, chloride 91, CO2 15, glucose 798, BUN 69, creatinine 1.77, anion 26, lactic acid 2.4, beta- hydroxybutyric acid >8.00, acetaminophen level <10, salicylate level <7.0, urine drug screen negative, UA negative for UTI, and abg pH 7.07/pCO2 51/pO2 73/acid-base deficit 15.3/bicarb 14.8. She received 1 amp of bicarb and insulin gtt initiated due to DKA. CT Head/Cervical Spine ordered and during transport to CT  pt developed worsening hypoxia with O2 sats 50% on 6L and some respiratory depression subsequently requiring mechanical intubation.  CT Head/Cervical Spine resuilts negative. She was subsequently admitted to ICU per hospitalist team for additional workup and treatment   PAST MEDICAL HISTORY :   has a past medical history of Anxiety, Asthma, Diabetes mellitus without complication ([facility]), and Headache(784.0).  has a past surgical history that includes No past surgeries. Prior to Admission medications   Medication Sig Start Date End Date Taking? Authorizing Provider  Ensure Max Protein (ENSURE MAX PROTEIN) LIQD Take 330 mLs (11 oz total) by mouth 2 (two) times daily between meals. 05/10/19   Alford Highland, MD  Insulin Glargine (BASAGLAR KWIKPEN) 100 UNIT/ML SOPN Inject 0.15 mLs (15 Units total) into the skin every evening. 05/10/19   Alford Highland, MD  insulin lispro (HUMALOG KWIKPEN) 100 UNIT/ML KwikPen Inject 0.04 mLs (4 Units total) into the skin 3 (three) times daily. 05/10/19   Alford Highland, MD  ondansetron (ZOFRAN) 4 MG tablet Take 1 tablet (4 mg total) by mouth every 6 (six) hours as needed for nausea. 01/18/19   Altamese Dilling, MD   Allergies  Allergen Reactions  . Latex Rash    FAMILY HISTORY:  family history includes Heart disease in an other family member. SOCIAL HISTORY:  reports that she has been smoking cigarettes. She has a 5.00 pack-year smoking history. She has never used smokeless tobacco. She reports that she does not drink alcohol or use drugs.  REVIEW OF SYSTEMS:   Unable to assess pt intubated   SUBJECTIVE:  Unable to assess pt intubated   VITAL SIGNS: Temp:  [  98.2 F (36.8 C)-98.4 F (36.9 C)] 98.2 F (36.8 C) (11/07 0800) Pulse Rate:  [48-73] 58 (11/07 0300) Resp:  [8-25] 18 (11/07 1100) BP: (101-130)/(71-99) 110/72 (11/07 1100) SpO2:  [90 %-100 %] 100 % (11/07 0300)  PHYSICAL EXAMINATION: General: acutely ill appearing female, NAD mechanically  intubated  Neuro: sedated, not following commands, PERRL  HEENT: supple, no JVD  Cardiovascular: nsr, rrr, no R/G  Lungs: rhonchi throughout, slightly tachypneic  Abdomen: +BS x4, soft, non tender, non distended  Musculoskeletal: normal bulk and tone, no edema  Skin: intact no rashes or lesions present   Recent Labs  Lab 07/29/19 0505 07/29/19 2352 07/30/19 0408  NA 142 137 138  K 3.9 4.1 3.8  CL 105 101 100  CO2 25 26 28   BUN 21* 18 18  CREATININE 0.68 0.52 0.54  GLUCOSE 328* 223* 176*   Recent Labs  Lab 07/27/19 1446 07/28/19 0452  HGB 13.5 12.0  HCT 43.7 35.6*  WBC 7.6 4.4  PLT 272 184   No results found.  ASSESSMENT / PLAN:  Acute hypoxic respiratory failure secondary to anion gap metabolic acidosis in the setting of DKA and possible aspiration  Mechanical intubation  Hx: Asthma  S/p liberation from MV -changing unasyn to augmentin to finihs 5 day course abx for asp pneumonia   - influenza panel by pcr   Hypotension  resolved  Acute renal failure  resolved  Diabetic ketoacidosis  -resolved  Acute encephalopathy  Resolved   SUP px: iv protonix  VTE px: subq lovenox  Restart PO diet diabetic      Ottie Glazier, M.D.  [facility] [place]

## 2019-07-30 NOTE — Progress Notes (Signed)
Pharmacy Antibiotic Note  Kristine Allison is a 38 y.o. female admitted on 07/27/2019 with pneumonia.  Pharmacy has been consulted for Unasyn dosing. Patient is receiving a 5-day course of Azithromycin for additional atypical coverage. F/u with resp culture.    Plan: Day 4 Unasyn 3 gm IV Q6H  Day 3 Azithromycin 500 mg IV Q24H  Height: 5\' 4"  (162.6 cm) Weight: 117 lb 15.1 oz (53.5 kg) IBW/kg (Calculated) : 54.7  Temp (24hrs), Avg:98.3 F (36.8 C), Min:98.1 F (36.7 C), Max:98.6 F (37 C)  Recent Labs  Lab 07/27/19 1446 07/27/19 2038 07/27/19 2356 07/28/19 0452 07/29/19 0505 07/29/19 2352 07/30/19 0408  WBC 7.6  --   --  4.4  --   --   --   CREATININE 1.77* 1.52* 1.09* 0.72 0.68 0.52 0.54  LATICACIDVEN 2.4*  2.4* 2.4* 2.1*  --   --  0.9  --     Estimated Creatinine Clearance: 82.1 mL/min (by C-G formula based on SCr of 0.54 mg/dL).    Allergies  Allergen Reactions  . Latex Rash    Antimicrobials this admission:  Unasyn 11/4 >>   Azithromycin 11/5 >> 11/9  Microbiology results: 11/4: BCx: NGTD 11/4: UCx: no growth (final) 11/4: Sputum Culture: abundant GNR, moderate GPC in pairs. 11/4: MRSA PCR: NEGATIVE 11/4: SARS-CoV-2: NEGATIVE  Thank you for allowing pharmacy to be a part of this patient's care.  Oswald Hillock, PharmD, BCPS 07/30/2019 7:53 AM

## 2019-07-31 ENCOUNTER — Encounter: Payer: Self-pay | Admitting: *Deleted

## 2019-07-31 DIAGNOSIS — J690 Pneumonitis due to inhalation of food and vomit: Secondary | ICD-10-CM

## 2019-07-31 DIAGNOSIS — E1010 Type 1 diabetes mellitus with ketoacidosis without coma: Secondary | ICD-10-CM

## 2019-07-31 DIAGNOSIS — J45901 Unspecified asthma with (acute) exacerbation: Secondary | ICD-10-CM

## 2019-07-31 DIAGNOSIS — I959 Hypotension, unspecified: Secondary | ICD-10-CM

## 2019-07-31 LAB — BASIC METABOLIC PANEL
Anion gap: 9 (ref 5–15)
BUN: 16 mg/dL (ref 6–20)
CO2: 27 mmol/L (ref 22–32)
Calcium: 8 mg/dL — ABNORMAL LOW (ref 8.9–10.3)
Chloride: 104 mmol/L (ref 98–111)
Creatinine, Ser: 0.53 mg/dL (ref 0.44–1.00)
GFR calc Af Amer: >60 mL/min (ref >60)
GFR calc non Af Amer: >60 mL/min (ref >60)
Glucose, Bld: 85 mg/dL (ref 70–99)
Potassium: 3.3 mmol/L — ABNORMAL LOW (ref 3.5–5.1)
Sodium: 140 mmol/L (ref 135–145)

## 2019-07-31 LAB — GLUCOSE, CAPILLARY
Glucose-Capillary: 106 mg/dL — ABNORMAL HIGH (ref 70–99)
Glucose-Capillary: 186 mg/dL — ABNORMAL HIGH (ref 70–99)
Glucose-Capillary: 224 mg/dL — ABNORMAL HIGH (ref 70–99)
Glucose-Capillary: 72 mg/dL (ref 70–99)
Glucose-Capillary: 74 mg/dL (ref 70–99)
Glucose-Capillary: 90 mg/dL (ref 70–99)

## 2019-07-31 MED ORDER — INSULIN GLARGINE 100 UNIT/ML ~~LOC~~ SOLN: 7.0000 [IU] | Freq: Every day | SUBCUTANEOUS | Status: DC

## 2019-07-31 MED ORDER — AZITHROMYCIN 250 MG PO TABS
500.0000 mg | ORAL_TABLET | Freq: Every day | ORAL | Status: AC
  Administered 2019-07-31 – 2019-08-01 (×2): 500 mg via ORAL
  Filled 2019-07-31 (×2): qty 2

## 2019-07-31 MED ORDER — POTASSIUM CHLORIDE CRYS ER 20 MEQ PO TBCR
40.0000 meq | EXTENDED_RELEASE_TABLET | Freq: Once | ORAL | Status: DC
  Filled 2019-07-31: qty 2

## 2019-07-31 MED ORDER — POTASSIUM CHLORIDE CRYS ER 20 MEQ PO TBCR
40.0000 meq | EXTENDED_RELEASE_TABLET | Freq: Once | ORAL | Status: AC
  Administered 2019-07-31: 40 meq via ORAL
  Filled 2019-07-31: qty 2

## 2019-07-31 MED ORDER — INSULIN GLARGINE 100 UNIT/ML ~~LOC~~ SOLN
7.0000 [IU] | Freq: Every day | SUBCUTANEOUS | Status: DC
  Filled 2019-07-31 (×3): qty 0.07

## 2019-07-31 NOTE — Progress Notes (Signed)
PROGRESS NOTE  Kristine Allison NID:[id number] DOB: [birth year] DOA: 07/27/2019 PCP: Patient, No Pcp Per  Brief History   38 yo female admitted with acute encephalopathy and acute hypoxic respiratory failure secondary to possible aspiration pneumonia and anion gap metabolic acidosis in the setting of DKA requiring insulin gtt and mechanical intubation. The patient was admitted to the ICU on a DKA protocol.  She was extubated on 07/30/2019 and insulin drip discontinued. She has been transferred to the floor and has been continued on lantus and correction insulin. She has been converted from Unasyn to augmentin to complete her 5 day course of antibiotics for aspiration pneumonia.  Consultants  . PCCM  Procedures  Intubation/extubation  Antibiotics   Anti-infectives (From admission, onward)   Start     Dose/Rate Route Frequency Ordered Stop   07/31/19 1200  azithromycin (ZITHROMAX) tablet 500 mg     500 mg Oral Daily 07/31/19 0824 08/02/19 1159   07/28/19 1130  azithromycin (ZITHROMAX) 500 mg in sodium chloride 0.9 % 250 mL IVPB  Status:  Discontinued     500 mg 250 mL/hr over 60 Minutes Intravenous Daily 07/28/19 1109 07/31/19 0824   07/27/19 2100  Ampicillin-Sulbactam (UNASYN) 3 g in sodium chloride 0.9 % 100 mL IVPB     3 g 200 mL/hr over 30 Minutes Intravenous Every 6 hours 07/27/19 2043      .  Subjective  The patient is resting comfortably. No new complaints.  Objective   Vitals:  Vitals:   07/31/19 0432 07/31/19 1214  BP: 111/72 107/69  Pulse: (!) 42 (!) 56  Resp: 17 16  Temp: 98.1 F (36.7 C) 97.9 F (36.6 C)  SpO2: 95% 92%   Exam:  Constitutional:  . The patient is awake, alert, and oriented x 3. No acute distress. Respiratory:  . No increased work of breathing. . No wheezes, rales, or rhonchi . No tactile fremitus Cardiovascular:  . Regular rate and rhythm . No murmurs, ectopy, or gallups. . No lateral PMI. No thrills. Abdomen:  . Abdomen is soft,  non-tender, non-distended . No hernias, masses, or organomegaly . Normoactive bowel sounds.  Musculoskeletal:  . No cyanosis, clubbing, or edema Skin:  . No rashes, lesions, ulcers . palpation of skin: no induration or nodules Neurologic:  . CN 2-12 intact . Sensation all 4 extremities intact Psychiatric:  . Mental status o Mood, affect appropriate o Orientation to person, place, time  . judgment and insight appear intact  I have personally reviewed the following:   Today's Data  . Vitals, BMP, Glucoses  Micro Data  . Positive for rhinovirus/enterovirus . Negative for COVID-19 . H. Influenza grown from respiratory culture . Blood cultures x 2 have had no growth  Scheduled Meds: . azithromycin  500 mg Oral Daily  . Chlorhexidine Gluconate Cloth  6 each Topical Q0600  . enoxaparin (LOVENOX) injection  40 mg Subcutaneous Q24H  . feeding supplement (ENSURE ENLIVE)  237 mL Oral BID BM  . insulin aspart  0-9 Units Subcutaneous Q4H  . [START ON 08/01/2019] insulin glargine  7 Units Subcutaneous Daily  . multivitamin with minerals  1 tablet Oral Daily  . pantoprazole  40 mg Oral Daily  . potassium chloride  40 mEq Oral Once   Continuous Infusions: . sodium chloride Stopped (07/30/19 1741)  . ampicillin-sulbactam (UNASYN) IV 3 g (07/31/19 0830)    Active Problems:   DKA (diabetic ketoacidoses) (HCC)   LOS: 4 days   A & P  Acute hypoxic respiratory failure secondary to anion gap metabolic acidosis in the setting of DKA and possible aspiration pneumonia with exacerbation of asthma: Patient required mechanical intubation. Extubated on 07/30/2019. She has been converted from IV unasynn to augmentin to complete a 5 day course of antibiotics for aspiration pneumonia.  Hypotension: Blood pressures remain low normal. Will check orthostatics.  Acute renal failure: Creatinine is 0.53 this morning.   Diabetic ketoacidosis: Resolved. Patient's glucoses are being followed with lantus  and correction insulin.   Acute encephalopathy:Resolved   I have seen and examined this patient myself. I have spent 34 minutes in her evaluation and care  DVT Prophylaxis: Lovenox CODE STATUS: Full Code Family Communication: None available Disposition: Home  Larenz Frasier, DO [facility] Direct contact: see [email or web address]  7PM-7AM contact night coverage as above 07/31/2019, 1:08 PM  LOS: 4 days

## 2019-07-31 NOTE — Progress Notes (Signed)
PHARMACIST - PHYSICIAN COMMUNICATION  CONCERNING: Antibiotic IV to Oral Route Change Policy  RECOMMENDATION: This patient is receiving Azithromycin by the intravenous route.  Based on criteria approved by the Pharmacy and Therapeutics Committee, the antibiotic(s) is/are being converted to the equivalent oral dose form(s).   DESCRIPTION: These criteria include:  Patient being treated for a respiratory tract infection, urinary tract infection, cellulitis or clostridium difficile associated diarrhea if on metronidazole  The patient is not neutropenic and does not exhibit a GI malabsorption state  The patient is eating (either orally or via tube) and/or has been taking other orally administered medications for a least 24 hours  The patient is improving clinically and has a Tmax < 100.5  If you have questions about this conversion, please contact the [place], PharmD, BCPS Clinical Pharmacist 07/31/2019 8:25 AM

## 2019-07-31 NOTE — Plan of Care (Signed)
Patient doing well.  FSBS have been normal.  Lantus held this am.  Lungs still have some rhonchi.  Ambulating well in the room.  Voiding adequately.  Tolerating diet well.  No significant changes.

## 2019-07-31 NOTE — Progress Notes (Signed)
Pharmacy Electrolyte Monitoring Consult:  Kristine Allison is a 69 YOF admitted on 07/27/2019 with hyperglycemia and altered mental status. PMH includes: T1DM with recurrent hospitalizations for DKA, asthma, anxiety, and headaches. Pharmacy has been consulted to assist in monitoring and replacing electrolytes.  Extubated 11/6.   Labs:  Sodium (mmol/L)  Date Value  07/31/2019 140   Potassium (mmol/L)  Date Value  07/31/2019 3.3 (L)   Magnesium (mg/dL)  Date Value  07/30/2019 2.1   Phosphorus (mg/dL)  Date Value  07/30/2019 2.7   Calcium (mg/dL)  Date Value  07/31/2019 8.0 (L)   Albumin (g/dL)  Date Value  07/27/2019 4.2    Assessment/Plan: Electrolytes:  K: 3.3. Will replace with KCL 43mEq x 1 dose.  - pt on MVI  Patient has transferred out of the ICU and providers have changed over. Will sign off consult at this time.   Thank you for allowing pharmacy to be a part of this patient's care.  Pernell Dupre, PharmD, BCPS 07/31/2019 8:17 AM

## 2019-08-01 LAB — GLUCOSE, CAPILLARY
Glucose-Capillary: 121 mg/dL — ABNORMAL HIGH (ref 70–99)
Glucose-Capillary: 157 mg/dL — ABNORMAL HIGH (ref 70–99)
Glucose-Capillary: 168 mg/dL — ABNORMAL HIGH (ref 70–99)
Glucose-Capillary: 181 mg/dL — ABNORMAL HIGH (ref 70–99)
Glucose-Capillary: 213 mg/dL — ABNORMAL HIGH (ref 70–99)

## 2019-08-01 LAB — CBC
HCT: 33.1 % — ABNORMAL LOW (ref 36.0–46.0)
Hemoglobin: 11.5 g/dL — ABNORMAL LOW (ref 12.0–15.0)
MCH: 31.3 pg (ref 26.0–34.0)
MCHC: 34.7 g/dL (ref 30.0–36.0)
MCV: 89.9 fL (ref 80.0–100.0)
Platelets: 187 10*3/uL (ref 150–400)
RBC: 3.68 MIL/uL — ABNORMAL LOW (ref 3.87–5.11)
RDW: 12.2 % (ref 11.5–15.5)
WBC: 8.4 10*3/uL (ref 4.0–10.5)
nRBC: 0.2 % (ref 0.0–0.2)

## 2019-08-01 LAB — BASIC METABOLIC PANEL
Anion gap: 10 (ref 5–15)
BUN: 12 mg/dL (ref 6–20)
CO2: 26 mmol/L (ref 22–32)
Calcium: 7.8 mg/dL — ABNORMAL LOW (ref 8.9–10.3)
Chloride: 101 mmol/L (ref 98–111)
Creatinine, Ser: 0.59 mg/dL (ref 0.44–1.00)
GFR calc Af Amer: >60 mL/min (ref >60)
GFR calc non Af Amer: >60 mL/min (ref >60)
Glucose, Bld: 179 mg/dL — ABNORMAL HIGH (ref 70–99)
Potassium: 3.7 mmol/L (ref 3.5–5.1)
Sodium: 137 mmol/L (ref 135–145)

## 2019-08-01 LAB — CULTURE, BLOOD (ROUTINE X 2)
Culture: NO GROWTH
Culture: NO GROWTH
Special Requests: ADEQUATE
Special Requests: ADEQUATE

## 2019-08-01 MED ORDER — ADULT MULTIVITAMIN W/MINERALS CH: 1.0000 | ORAL_TABLET | Freq: Every day | ORAL | 0 refills | Status: DC

## 2019-08-01 MED ORDER — INSULIN GLARGINE 100 UNIT/ML ~~LOC~~ SOLN
12.0000 [IU] | Freq: Every day | SUBCUTANEOUS | Status: DC
  Filled 2019-08-01: qty 0.12

## 2019-08-01 MED ORDER — INSULIN GLARGINE 100 UNIT/ML ~~LOC~~ SOLN
12.0000 [IU] | Freq: Once | SUBCUTANEOUS | Status: AC
  Administered 2019-08-01: 14:00:00 12 [IU] via SUBCUTANEOUS
  Filled 2019-08-01: qty 0.12

## 2019-08-01 MED ORDER — OXYCODONE-ACETAMINOPHEN 5-325 MG PO TABS: 1.0000 | ORAL_TABLET | Freq: Four times a day (QID) | ORAL | 0 refills | Status: DC | PRN

## 2019-08-01 NOTE — Progress Notes (Signed)
08/01/2019 1:53 PM'  Kristine Allison to be D/C'd Home per MD order.  Discussed prescriptions and follow up appointments with the patient. Prescriptions given to patient, medication list explained in detail. Pt verbalized understanding.  Allergies as of 08/01/2019      Reactions   Latex Rash      Medication List    TAKE these medications   Basaglar KwikPen 100 UNIT/ML Sopn Inject 0.15 mLs (15 Units total) into the skin every evening. Notes to patient: Evening 08/01/19   Ensure Max Protein Liqd Take 330 mLs (11 oz total) by mouth 2 (two) times daily between meals. Notes to patient: afternoon 08/01/19   insulin lispro 100 UNIT/ML KwikPen Commonly known as: HumaLOG KwikPen Inject 0.04 mLs (4 Units total) into the skin 3 (three) times daily. Notes to patient: Evening 08/01/19   multivitamin with minerals Tabs tablet Take 1 tablet by mouth daily. Start taking on: August 02, 2019 Notes to patient: Morning 08/02/19   ondansetron 4 MG tablet Commonly known as: ZOFRAN Take 1 tablet (4 mg total) by mouth every 6 (six) hours as needed for nausea.   oxyCODONE-acetaminophen 5-325 MG tablet Commonly known as: PERCOCET/ROXICET Take 1 tablet by mouth every 6 (six) hours as needed for moderate pain or severe pain. Notes to patient: As needed       Vitals:   08/01/19 0837 08/01/19 1314  BP: 134/80 128/72  Pulse: 61 79  Resp: 12 12  Temp: 98.3 F (36.8 C) 98.4 F (36.9 C)  SpO2: 96% 95%    Skin clean, dry and intact without evidence of skin break down, no evidence of skin tears noted. IV catheter discontinued intact. Site without signs and symptoms of complications. Dressing and pressure applied. Pt denies pain at this time. No complaints noted.  An After Visit Summary was printed and given to the patient. Patient escorted via [place], and D/C home via private auto.  Dola Argyle

## 2019-08-01 NOTE — Progress Notes (Signed)
Pharmacy Antibiotic Note  Kristine Allison is a 38 y.o. female admitted on 07/27/2019 with pneumonia.  Pharmacy has been consulted for Unasyn dosing. Patient is receiving a 5-day course of Azithromycin for additional atypical coverage. F/u with resp culture.    Plan: Day 6 Unasyn 3 gm IV Q6H. Recommended IV-PO transition to Augmentin and/OR 7 day stop date.    Day 5 Azithromycin 500 mg IV Q24H  Height: 5\' 4"  (162.6 cm) Weight: 117 lb 15.1 oz (53.5 kg) IBW/kg (Calculated) : 54.7  Temp (24hrs), Avg:98.5 F (36.9 C), Min:97.9 F (36.6 C), Max:99.1 F (37.3 C)  Recent Labs  Lab 07/27/19 1446 07/27/19 2038 07/27/19 2356 07/28/19 0452 07/29/19 0505 07/29/19 2352 07/30/19 0408 07/31/19 0629 08/01/19 0458  WBC 7.6  --   --  4.4  --   --   --   --  8.4  CREATININE 1.77* 1.52* 1.09* 0.72 0.68 0.52 0.54 0.53 0.59  LATICACIDVEN 2.4*  2.4* 2.4* 2.1*  --   --  0.9  --   --   --     Estimated Creatinine Clearance: 82.1 mL/min (by C-G formula based on SCr of 0.59 mg/dL).    Allergies  Allergen Reactions  . Latex Rash    Antimicrobials this admission:  Unasyn 11/4 >>   Azithromycin 11/5 >> 11/9  Microbiology results: 11/4: BCx: NGTD 11/4: UCx: no growth (final) 11/4: Sputum Culture: abundant GNR, moderate GPC in pairs. 11/4: MRSA PCR: NEGATIVE 11/4: SARS-CoV-2: NEGATIVE  Thank you for allowing pharmacy to be a part of this patient's care.  Pernell Dupre, PharmD, BCPS 08/01/2019 9:07 AM

## 2019-08-01 NOTE — TOC Transition Note (Signed)
Transition of Care [facility]) - CM/SW Discharge Note   Patient Details  Name: Kristine Allison MRN: [id number] Date of Birth: 1981/01/17  [facility] CM/SW Contact:  Beverly Sessions, RN Phone Number: 08/01/2019, 4:42 PM   Clinical Narrative:     Patient admitted from home with DKA Patient states that she lives at home with her mother   PCP - Patient has submitted application and eligibility to [place] - Patient obtains her medications from Medication Management   Patient discharging with narcotics.  Provided goodrx coupon Denies issues obtaining medications or with transportation   Patient confirms she has a glucometer and testing supplies       Final next level of care: Home/Self Care Barriers to Discharge: No Barriers Identified   Patient Goals and CMS Choice        Discharge Placement                       Discharge Plan and Services                                     Social Determinants of Health (SDOH) Interventions     Readmission Risk Interventions Readmission Risk Prevention Plan 08/01/2019  Transportation Screening Complete  PCP or Specialist Appt within 3-5 Days Not Complete  Not Complete comments Patient has submitted application to [facility]  Palliative Care Screening Not Applicable  Medication Review (RN Care Manager) Complete  Some recent data might be hidden

## 2019-08-02 LAB — CULTURE, RESPIRATORY W GRAM STAIN

## 2019-08-02 NOTE — Discharge Summary (Signed)
Physician Discharge Summary  [name] OZD:[id number] DOB: 1981/01/23 DOA: 07/27/2019  PCP: Patient, No Pcp Per  Admit date: 07/27/2019 Discharge date: 08/02/2019  Recommendations for Outpatient Follow-up:  1. Follow up with PCP in 7-10 days. 2. Have chemistry drawn on 08/04/2019 and reported to PCP.  Discharge Diagnoses: Principal diagnosis is #1 1. Acute hypoxic respiratory failure 2. HAGMA 3. DKA 4. Aspiration pneumonia 5. Asthma exacerbation 6. Hypotension 7. encephalopathy  Discharge Condition: Fair  Disposition: Home  Diet recommendation: Carbohydrate controlled.  Filed Weights   07/27/19 1440 07/29/19 0443  Weight: 63 kg 53.5 kg    History of present illness:   Che Below Kristine Allison is a 38 y.o. female with medical history significant of Type 1 diabetes with recurrent hospitalization for DKA, asthma who reportedly started to feel unwell about 3 days ago with cough and had outpatient COVID test 2 days ago which returned negative.    ED Course: Patient presented with altered mental status and was unable to provide any history. She was found to be tachypnic and tachycardic requiring oxygen via nasal cannula but ultimately needed mechanical ventilation secondary to respiratory failure. PH of 6.98, Bicarb of 18, Anion gap of 26 and glucose of 798.She required Levophed peri-intubation but was able to wean prior to admission. She was given an amp of Bicarb, started on insulin drip, and Precedex.   Hospital Course:  38 yo female admitted with acute encephalopathy and acute hypoxic respiratory failure secondary to possible aspiration pneumonia and anion gap metabolic acidosis in the setting of DKA requiring insulin gtt and mechanical intubation. The patient was admitted to the ICU on a DKA protocol.  She was extubated on 07/30/2019 and insulin drip discontinued. She has been transferred to the floor and has been continued on lantus and correction insulin. She has  been converted from Unasyn to augmentin to complete her 5 day course of antibiotics for aspiration pneumonia.   Her AKI and hyperglycemia resolved. She was discharged to home in fair condition.  Today's assessment: S: The patient is awake, alert, and oriented x 3.  O: Vitals:  Vitals:   08/01/19 0837 08/01/19 1314  BP: 134/80 128/72  Pulse: 61 79  Resp: 12 12  Temp: 98.3 F (36.8 C) 98.4 F (36.9 C)  SpO2: 96% 95%   Exam:  Constitutional:   The patient is awake, alert, and oriented x 3. No acute distress. Respiratory:   No increased work of breathing.  No wheezes, rales, or rhonchi  No tactile fremitus Cardiovascular:   Regular rate and rhythm  No murmurs, ectopy, or gallups.  No lateral PMI. No thrills. Abdomen:   Abdomen is soft, non-tender, non-distended  No hernias, masses, or organomegaly  Normoactive bowel sounds.  Musculoskeletal:   No cyanosis, clubbing, or edema Skin:   No rashes, lesions, ulcers  palpation of skin: no induration or nodules Neurologic:   CN 2-12 intact  Sensation all 4 extremities intact Psychiatric:   Mental status o Mood, affect appropriate o Orientation to person, place, time   judgment and insight appear intact    Discharge Instructions  Discharge Instructions    Activity as tolerated - No restrictions   Complete by: As directed    Call MD for:  difficulty breathing, headache or visual disturbances   Complete by: As directed    Call MD for:  persistant nausea and vomiting   Complete by: As directed    Call MD for:  severe uncontrolled pain   Complete  by: As directed    Diet - low sodium heart healthy   Complete by: As directed    Discharge instructions   Complete by: As directed    Follow up with PCP in 7-10 days. Have chemistry drawn on 08/04/2019 and reported to PCP.   Increase activity slowly   Complete by: As directed      Allergies as of 08/01/2019      Reactions   Latex Rash      Medication  List    TAKE these medications   Basaglar KwikPen 100 UNIT/ML Sopn Inject 0.15 mLs (15 Units total) into the skin every evening. Notes to patient: Evening 08/01/19   Ensure Max Protein Liqd Take 330 mLs (11 oz total) by mouth 2 (two) times daily between meals. Notes to patient: afternoon 08/01/19   insulin lispro 100 UNIT/ML KwikPen Commonly known as: HumaLOG KwikPen Inject 0.04 mLs (4 Units total) into the skin 3 (three) times daily. Notes to patient: Evening 08/01/19   multivitamin with minerals Tabs tablet Take 1 tablet by mouth daily. Notes to patient: Morning 08/02/19   ondansetron 4 MG tablet Commonly known as: ZOFRAN Take 1 tablet (4 mg total) by mouth every 6 (six) hours as needed for nausea.   oxyCODONE-acetaminophen 5-325 MG tablet Commonly known as: PERCOCET/ROXICET Take 1 tablet by mouth every 6 (six) hours as needed for moderate pain or severe pain. Notes to patient: As needed      Allergies  Allergen Reactions   Latex Rash    The results of significant diagnostics from this hospitalization (including imaging, microbiology, ancillary and laboratory) are listed below for reference.    Significant Diagnostic Studies: Dg Abdomen 1 View  Result Date: 07/27/2019 CLINICAL DATA:  OG placement EXAM: ABDOMEN - 1 VIEW COMPARISON:  Chest radiograph 07/27/2019 FINDINGS: Transesophageal tube tip and side port distal to the GE junction, directed inferiorly and terminating in the left upper quadrant of, likely within the gastric body. Pacer pad partially imaged in the upper abdomen/lower chest. No high-grade obstructive bowel gas pattern. No suspicious calcifications. No acute or suspicious osseous lesions. IMPRESSION: Transesophageal tube tip and side port in the left upper quadrant of the abdomen, likely within the gastric body. No high-grade obstructive bowel gas pattern. Electronically Signed   By: Kreg ShropshirePrice  DeHay M.D.   On: 07/27/2019 17:10   Ct Head Wo Contrast  Result  Date: 07/27/2019 CLINICAL DATA:  38 year old female with altered mental status, presumed DKA EXAM: CT HEAD WITHOUT CONTRAST CT CERVICAL SPINE WITHOUT CONTRAST TECHNIQUE: Multidetector CT imaging of the head and cervical spine was performed following the standard protocol without intravenous contrast. Multiplanar CT image reconstructions of the cervical spine were also generated. COMPARISON:  None. FINDINGS: CT HEAD FINDINGS Brain: No acute intracranial hemorrhage. No midline shift or mass effect. Gray-white differentiation maintained. Unremarkable appearance of the ventricular system. Vascular: Unremarkable. Skull: No acute fracture.  No aggressive bone lesion identified. Sinuses/Orbits: Unremarkable appearance of the orbits. Mastoid air cells clear. No middle ear effusion. No significant sinus disease. Other: None CT CERVICAL SPINE FINDINGS Alignment: Craniocervical junction aligned. Anatomic alignment of the cervical elements. No subluxation. Skull base and vertebrae: No acute fracture at the skullbase. Vertebral body heights relatively maintained. No acute fracture identified. Soft tissues and spinal canal: Unremarkable cervical soft tissues. Lymph nodes are present, though not enlarged. Disc levels: Unremarkable appearance of disc space, which are maintained. Upper chest: Unremarkable appearance of the lung apices. Other: No bony canal narrowing. IMPRESSION: Head CT: No  CT evidence of acute intracranial abnormality. Cervical CT: No acute fracture or malalignment of the cervical spine. Electronically Signed   By: Gilmer Mor D.O.   On: 07/27/2019 18:44   Ct Cervical Spine Wo Contrast  Result Date: 07/27/2019 CLINICAL DATA:  38 year old female with altered mental status, presumed DKA EXAM: CT HEAD WITHOUT CONTRAST CT CERVICAL SPINE WITHOUT CONTRAST TECHNIQUE: Multidetector CT imaging of the head and cervical spine was performed following the standard protocol without intravenous contrast. Multiplanar CT  image reconstructions of the cervical spine were also generated. COMPARISON:  None. FINDINGS: CT HEAD FINDINGS Brain: No acute intracranial hemorrhage. No midline shift or mass effect. Gray-white differentiation maintained. Unremarkable appearance of the ventricular system. Vascular: Unremarkable. Skull: No acute fracture.  No aggressive bone lesion identified. Sinuses/Orbits: Unremarkable appearance of the orbits. Mastoid air cells clear. No middle ear effusion. No significant sinus disease. Other: None CT CERVICAL SPINE FINDINGS Alignment: Craniocervical junction aligned. Anatomic alignment of the cervical elements. No subluxation. Skull base and vertebrae: No acute fracture at the skullbase. Vertebral body heights relatively maintained. No acute fracture identified. Soft tissues and spinal canal: Unremarkable cervical soft tissues. Lymph nodes are present, though not enlarged. Disc levels: Unremarkable appearance of disc space, which are maintained. Upper chest: Unremarkable appearance of the lung apices. Other: No bony canal narrowing. IMPRESSION: Head CT: No CT evidence of acute intracranial abnormality. Cervical CT: No acute fracture or malalignment of the cervical spine. Electronically Signed   By: Gilmer Mor D.O.   On: 07/27/2019 18:44   Dg Chest Port 1 View  Result Date: 07/28/2019 CLINICAL DATA:  38 year old female with encephalopathy. EXAM: PORTABLE CHEST 1 VIEW COMPARISON:  Chest radiograph dated 07/27/2019 FINDINGS: Endotracheal tube approximately 2 cm above the carina an enteric tube extends below the diaphragm with tip beyond the inferior margin of the image. No focal consolidation, pleural effusion, or pneumothorax. The cardiac silhouette is within normal limits. No acute osseous pathology. IMPRESSION: No interval change.  Stable positioning of the support apparatus. Electronically Signed   By: Elgie Collard M.D.   On: 07/28/2019 10:10   Dg Chest Portable 1 View  Result Date:  07/27/2019 CLINICAL DATA:  Status post intubation. EXAM: PORTABLE CHEST 1 VIEW COMPARISON:  07/27/2019 FINDINGS: Interval placement of ET tube with tip above the carina. There is a nasogastric tube with tip below the level of the diaphragmatic hiatus. Normal heart size. No pleural effusion or edema identified. IMPRESSION: Status post intubation with tip above the carina. Lungs remain clear. Electronically Signed   By: Signa Kell M.D.   On: 07/27/2019 17:11   Dg Chest Port 1 View  Result Date: 07/27/2019 CLINICAL DATA:  Altered mental status and hyperglycemia. EXAM: PORTABLE CHEST 1 VIEW COMPARISON:  05/06/2019 FINDINGS: Lungs are adequately inflated without focal airspace consolidation or effusion. Cardiomediastinal silhouette, bones and soft tissues are normal. IMPRESSION: No active disease. Electronically Signed   By: Elberta Fortis M.D.   On: 07/27/2019 15:10    Microbiology: Recent Results (from the past 240 hour(s))  Novel Coronavirus, NAA ([facility])     Status: None   Collection Time: 07/25/19  9:14 AM   Specimen: Oropharyngeal(OP) collection in vial transport medium   OROPHARYNGEA  TESTING  Result Value Ref Range Status   SARS-CoV-2, NAA Not Detected Not Detected Final    Comment: This nucleic acid amplification test was developed and its performance characteristics determined by [employer]. Nucleic acid amplification tests include PCR and TMA. This test has not been  FDA cleared or approved. This test has been authorized by FDA under an Emergency Use Authorization (EUA). This test is only authorized for the duration of time the declaration that circumstances exist justifying the authorization of the emergency use of in vitro diagnostic tests for detection of SARS-CoV-2 virus and/or diagnosis of COVID-19 infection under section 564(b)(1) of the Act, 21 U.S.C. 161WRU-0(A) (1), unless the authorization is terminated or revoked sooner. When diagnostic testing is negative, the  possibility of a false negative result should be considered in the context of a patient's recent exposures and the presence of clinical signs and symptoms consistent with COVID-19. An individual without symptoms of COVID-19 and who is not shedding SARS-CoV-2 virus would  expect to have a negative (not detected) result in this assay.   Blood Culture (routine x 2)     Status: None   Collection Time: 07/27/19  2:46 PM   Specimen: BLOOD  Result Value Ref Range Status   Specimen Description BLOOD BLOOD RIGHT HAND  Final   Special Requests   Final    BOTTLES DRAWN AEROBIC AND ANAEROBIC Blood Culture adequate volume   Culture   Final    NO GROWTH 5 DAYS Performed at [facility], [address], [place], [place] [address]    Report Status 08/01/2019 FINAL  Final  Blood Culture (routine x 2)     Status: None   Collection Time: 07/27/19  2:46 PM   Specimen: BLOOD  Result Value Ref Range Status   Specimen Description BLOOD LEFT ANTECUBITAL  Final   Special Requests   Final    BOTTLES DRAWN AEROBIC AND ANAEROBIC Blood Culture adequate volume   Culture   Final    NO GROWTH 5 DAYS Performed at [facility], [address], [place], [place] [address]    Report Status 08/01/2019 FINAL  Final  Urine culture     Status: None   Collection Time: 07/27/19  2:46 PM   Specimen: In/Out Cath Urine  Result Value Ref Range Status   Specimen Description   Final    IN/OUT CATH URINE Performed at [facility], [address] [place], [place] [address]    Special Requests   Final    NONE Performed at [facility], [address] [place], [place] [address]    Culture   Final    NO GROWTH Performed at [facility], [address] [place], [place] [address]    Report Status 07/28/2019 FINAL  Final  SARS Coronavirus 2 by RT PCR (hospital order, performed in [facility] hospital lab) Nasopharyngeal Nasopharyngeal Swab     Status: None   Collection Time:  07/27/19  2:46 PM   Specimen: Nasopharyngeal Swab  Result Value Ref Range Status   SARS Coronavirus 2 NEGATIVE NEGATIVE Final    Comment: (NOTE) If result is NEGATIVE SARS-CoV-2 target nucleic acids are NOT DETECTED. The SARS-CoV-2 RNA is generally detectable in upper and lower  respiratory specimens during the acute phase of infection. The lowest  concentration of SARS-CoV-2 viral copies this assay can detect is 250  copies / mL. A negative result does not preclude SARS-CoV-2 infection  and should not be used as the sole basis for treatment or other  patient management decisions.  A negative result may occur with  improper specimen collection / handling, submission of specimen other  than nasopharyngeal swab, presence of viral mutation(s) within the  areas targeted by this assay, and inadequate number of viral  copies  (<250 copies / mL). A negative result must be combined with clinical  observations, patient history, and epidemiological information. If result is POSITIVE SARS-CoV-2 target nucleic acids are DETECTED. The SARS-CoV-2 RNA is generally detectable in upper and lower  respiratory specimens dur ing the acute phase of infection.  Positive  results are indicative of active infection with SARS-CoV-2.  Clinical  correlation with patient history and other diagnostic information is  necessary to determine patient infection status.  Positive results do  not rule out bacterial infection or co-infection with other viruses. If result is PRESUMPTIVE POSTIVE SARS-CoV-2 nucleic acids MAY BE PRESENT.   A presumptive positive result was obtained on the submitted specimen  and confirmed on repeat testing.  While 2019 novel coronavirus  (SARS-CoV-2) nucleic acids may be present in the submitted sample  additional confirmatory testing may be necessary for epidemiological  and / or clinical management purposes  to differentiate between  SARS-CoV-2 and other Sarbecovirus currently known to  infect humans.  If clinically indicated additional testing with an alternate test  methodology [phone]) is advised. The SARS-CoV-2 RNA is generally  detectable in upper and lower respiratory sp ecimens during the acute  phase of infection. The expected result is Negative. Fact Sheet for Patients:  BoilerBrush.com.cy Fact Sheet for Healthcare Providers: https://pope.com/ This test is not yet approved or cleared by the [place] FDA and has been authorized for detection and/or diagnosis of SARS-CoV-2 by FDA under an Emergency Use Authorization (EUA).  This EUA will remain in effect (meaning this test can be used) for the duration of the COVID-19 declaration under Section 564(b)(1) of the Act, 21 U.S.C. section 360bbb-3(b)(1), unless the authorization is terminated or revoked sooner. Performed at [facility], [address] [place], [place] [address]   MRSA PCR Screening     Status: None   Collection Time: 07/27/19  8:21 PM   Specimen: Nasopharyngeal  Result Value Ref Range Status   MRSA by PCR NEGATIVE NEGATIVE Final    Comment:        The GeneXpert MRSA Assay (FDA approved for NASAL specimens only), is one component of a comprehensive MRSA colonization surveillance program. It is not intended to diagnose MRSA infection nor to guide or monitor treatment for MRSA infections. Performed at [facility], [address] [place], [place] [address]   Culture, respiratory (non-expectorated)     Status: None   Collection Time: 07/27/19  8:39 PM   Specimen: Tracheal Aspirate; Respiratory  Result Value Ref Range Status   Specimen Description TRACHEAL ASPIRATE  Final   Special Requests NONE  Final   Gram Stain   Final    RARE WBC PRESENT, PREDOMINANTLY PMN ABUNDANT GRAM NEGATIVE RODS MODERATE GRAM POSITIVE COCCI IN PAIRS    Culture   Final    ABUNDANT HAEMOPHILUS INFLUENZAE BETA LACTAMASE NEGATIVE ABUNDANT  STREPTOCOCCUS PNEUMONIAE    Report Status 08/02/2019 FINAL  Final   Organism ID, Bacteria STREPTOCOCCUS PNEUMONIAE  Final      Susceptibility   Streptococcus pneumoniae - MIC*    ERYTHROMYCIN <=0.12 SENSITIVE Sensitive     LEVOFLOXACIN 0.5 SENSITIVE Sensitive     VANCOMYCIN 0.5 SENSITIVE Sensitive     * ABUNDANT STREPTOCOCCUS PNEUMONIAE  Respiratory Panel by PCR     Status: Abnormal   Collection Time: 07/28/19 11:22 AM   Specimen: Nasopharyngeal Swab; Respiratory  Result Value Ref Range Status   Adenovirus NOT DETECTED NOT DETECTED Final   Coronavirus 229E NOT DETECTED NOT  DETECTED Final    Comment: (NOTE) The Coronavirus on the Respiratory Panel, DOES NOT test for the novel  Coronavirus (2019 nCoV)    Coronavirus HKU1 NOT DETECTED NOT DETECTED Final   Coronavirus NL63 NOT DETECTED NOT DETECTED Final   Coronavirus OC43 NOT DETECTED NOT DETECTED Final   Metapneumovirus NOT DETECTED NOT DETECTED Final   Rhinovirus / Enterovirus DETECTED (A) NOT DETECTED Final   Influenza A NOT DETECTED NOT DETECTED Final   Influenza B NOT DETECTED NOT DETECTED Final   Parainfluenza Virus 1 NOT DETECTED NOT DETECTED Final   Parainfluenza Virus 2 NOT DETECTED NOT DETECTED Final   Parainfluenza Virus 3 NOT DETECTED NOT DETECTED Final   Parainfluenza Virus 4 NOT DETECTED NOT DETECTED Final   Respiratory Syncytial Virus NOT DETECTED NOT DETECTED Final   Bordetella pertussis NOT DETECTED NOT DETECTED Final   Chlamydophila pneumoniae NOT DETECTED NOT DETECTED Final   Mycoplasma pneumoniae NOT DETECTED NOT DETECTED Final    Comment: Performed at [facility], [address] [place], [place] [address]     Labs: Basic Metabolic Panel: Recent Labs  Lab 07/27/19 1515  07/29/19 0505 07/29/19 2352 07/30/19 0408 07/31/19 0629 08/01/19 0458  NA  --    < > 142 137 138 140 137  K  --    < > 3.9 4.1 3.8 3.3* 3.7  CL  --    < > 105 101 100 104 101  CO2  --    < > 25 26 28 27 26   GLUCOSE  --    <  > 328* 223* 176* 85 179*  BUN  --    < > 21* 18 18 16 12   CREATININE  --    < > 0.68 0.52 0.54 0.53 0.59  CALCIUM  --    < > 8.2* 7.9* 7.9* 8.0* 7.8*  MG 3.1*  --  2.2 2.1 2.1  --   --   PHOS 7.4*  --  1.8* 4.0 2.7  --   --    < > = values in this interval not displayed.   Liver Function Tests: Recent Labs  Lab 07/27/19 1446  AST 31  ALT 37  ALKPHOS 107  BILITOT 2.3*  PROT 7.5  ALBUMIN 4.2   No results for input(s): LIPASE, AMYLASE in the last 168 hours. No results for input(s): AMMONIA in the last 168 hours. CBC: Recent Labs  Lab 07/27/19 1446 07/28/19 0452 08/01/19 0458  WBC 7.6 4.4 8.4  NEUTROABS 5.5  --   --   HGB 13.5 12.0 11.5*  HCT 43.7 35.6* 33.1*  MCV 103.3* 92.5 89.9  PLT 272 184 187   Cardiac Enzymes: No results for input(s): CKTOTAL, CKMB, CKMBINDEX, TROPONINI in the last 168 hours. BNP: BNP (last 3 results) No results for input(s): BNP in the last 8760 hours.  ProBNP (last 3 results) No results for input(s): PROBNP in the last 8760 hours.  CBG: Recent Labs  Lab 08/01/19 0018 08/01/19 0325 08/01/19 0454 08/01/19 0835 08/01/19 1145  GLUCAP 157* 121* 181* 168* 213*    Active Problems:   DKA (diabetic ketoacidoses) (HCC)   Time coordinating discharge: 38 minutes  Signed:        Ofilia Rayon, DO [facility]  08/02/2019, 9:10 PM

## 2019-11-30 ENCOUNTER — Telehealth: Payer: Self-pay | Admitting: Pharmacy Technician

## 2019-11-30 NOTE — Telephone Encounter (Signed)
[facility] provided patient an initial 30 day supply of medication.  Patient failed to provide requested proof of income.  [facility] unable to provide additional medication assistance until patient provides requested financial documentation.  Adreena Willits J. Jewett Mcgann Care Manager [facility] 

## 2020-03-15 IMAGING — CT CT CERVICAL SPINE W/O CM
3 of 4 series · 12 of 35 positions shown, 14 images · non-contrast
Comparison: None.

CLINICAL DATA: 37-year-old female with altered mental status,
presumed DKA

EXAM:
CT HEAD WITHOUT CONTRAST
CT CERVICAL SPINE WITHOUT CONTRAST
TECHNIQUE: Multidetector CT imaging of the head and cervical spine was
performed following the standard protocol without intravenous
contrast. Multiplanar CT image reconstructions of the cervical spine
were also generated.

[Series 4: sagittal bone · sagittal · 0.25mm/px · 5 of 52 slices shown, 6 images]
[im 18/52  bone]
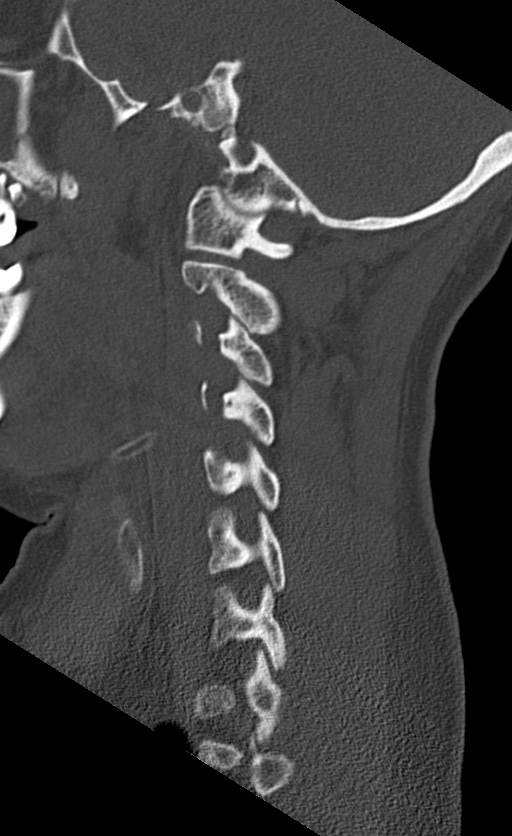
[im 22/52  bone]
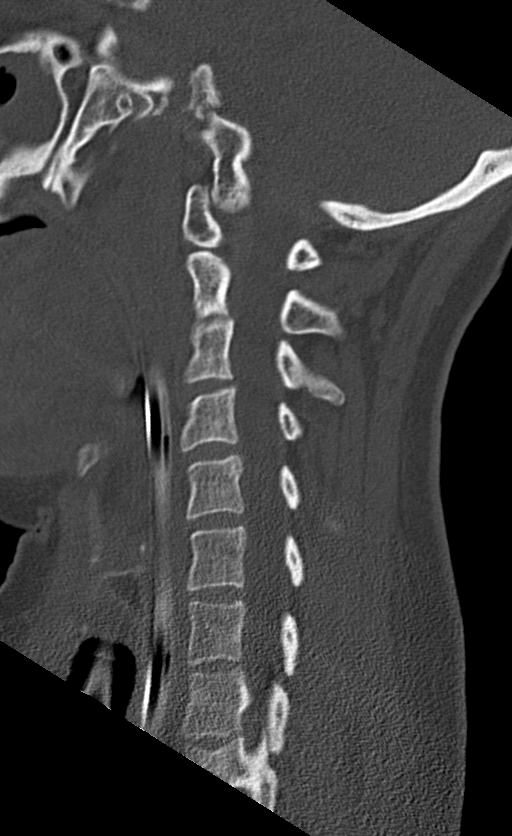
[im 26/52  soft-tissue]
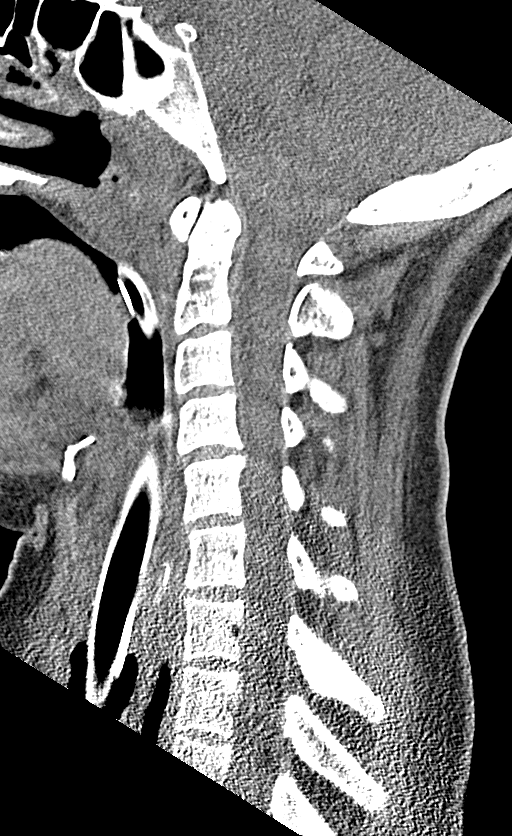
[im 26/52  bone]
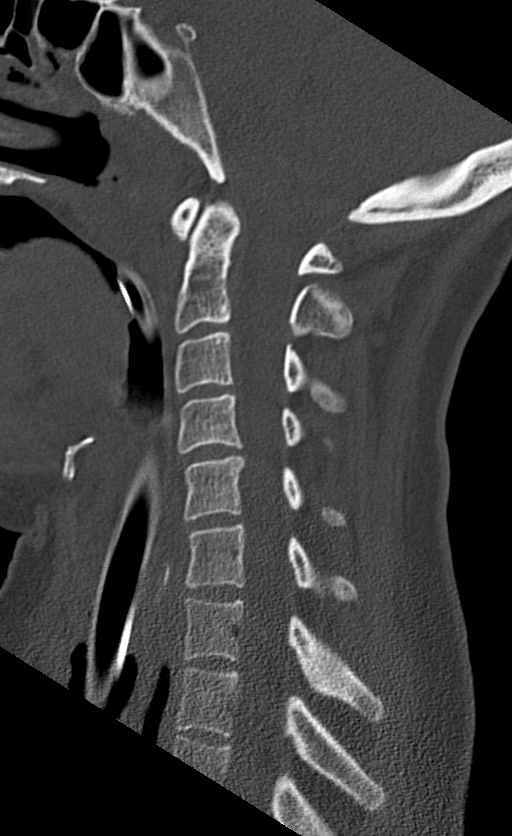
[im 30/52  bone]
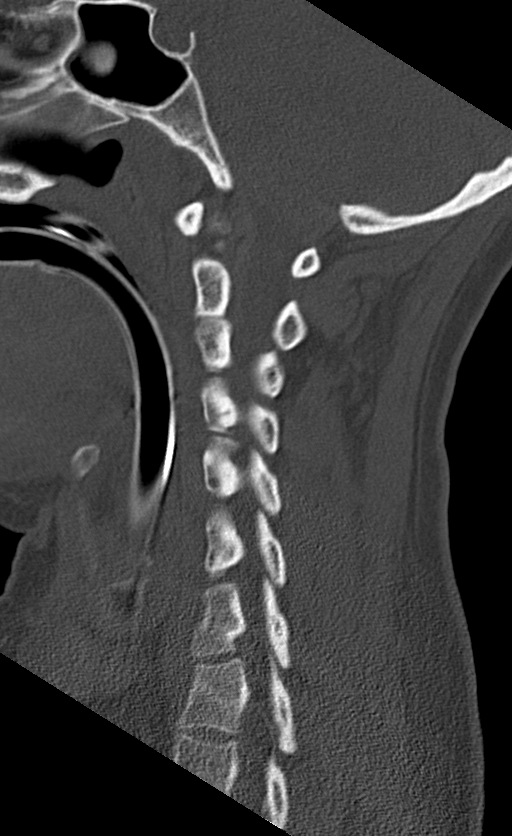
[im 35/52  bone]
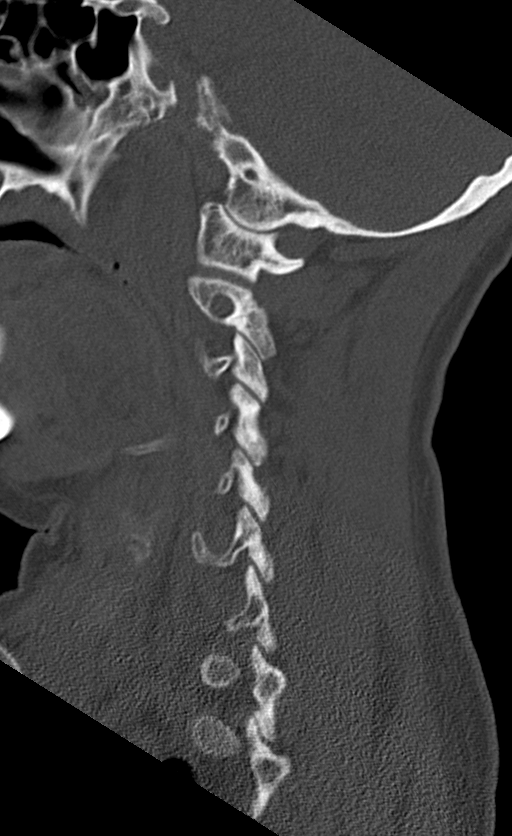

[Series 5: coronal bone · coronal · 0.27mm/px · 3 of 40 slices shown]
[im 8/40  bone]
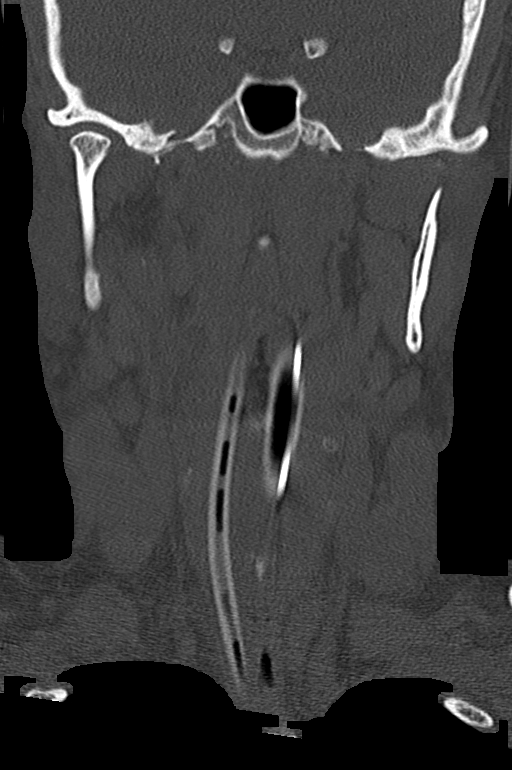
[im 16/40  bone]
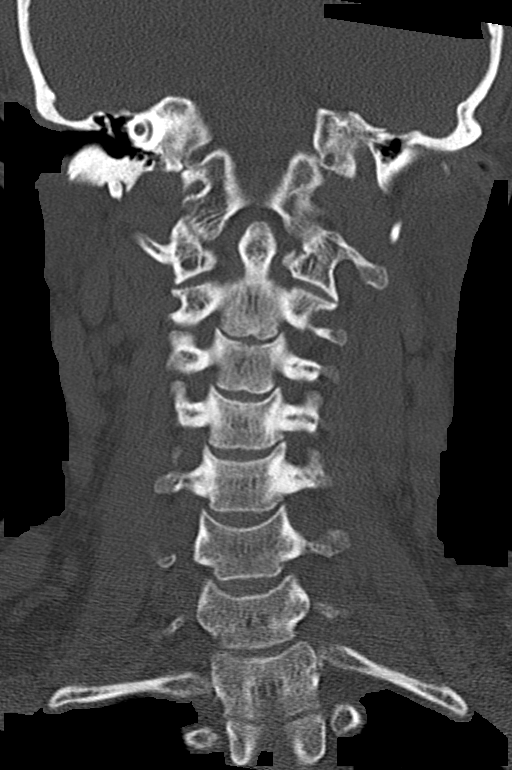
[im 24/40  bone]
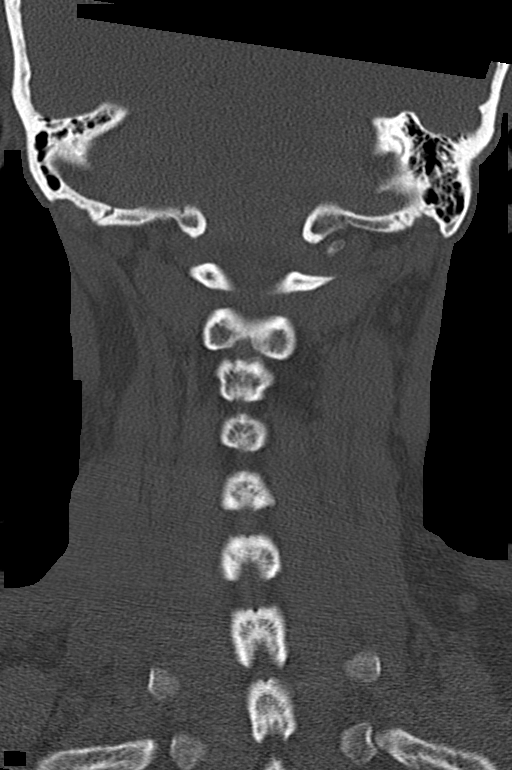

[Series 6: orthogonal bone · axial · 0.23mm/px · z∈[+179,+275]mm · 4 of 87 slices shown, 5 images]
[im 15/87  soft-tissue]
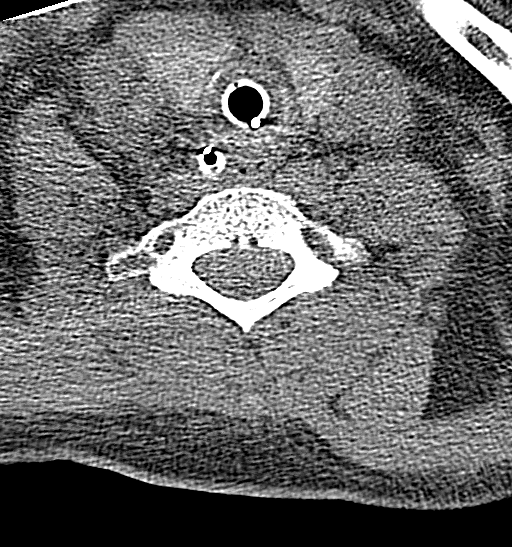
[im 15/87  bone]
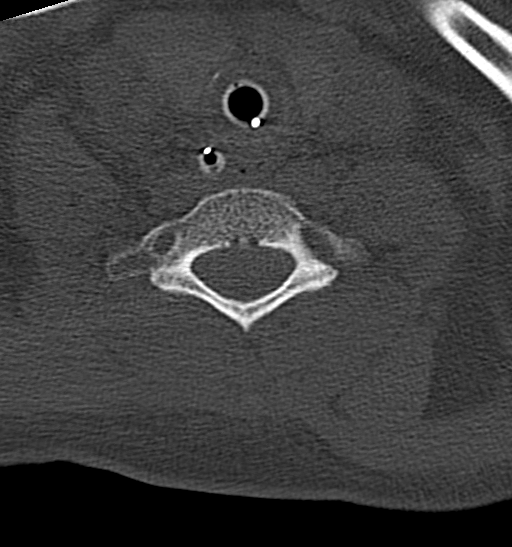
[im 29/87  bone]
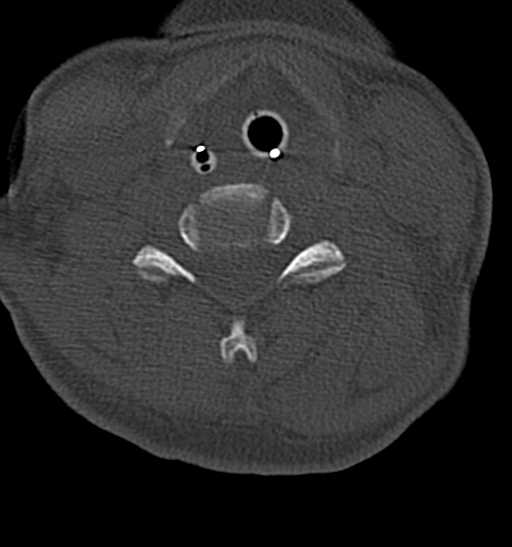
[im 58/87  bone]
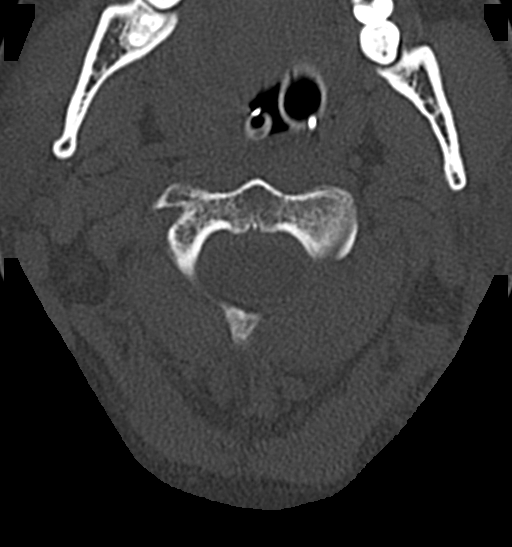
[im 72/87  bone]
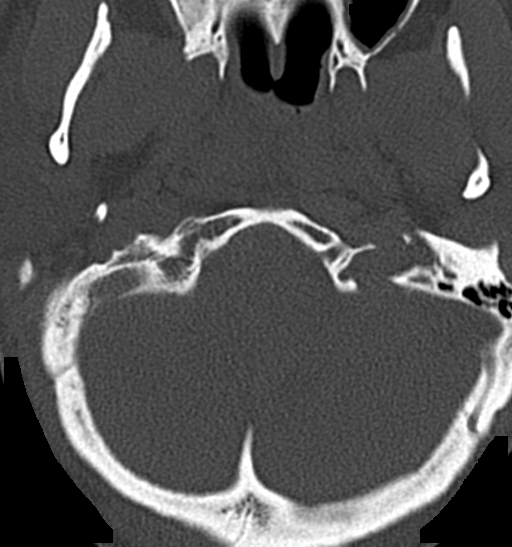

[12 of 35 positions shown; findings below may reference images not displayed]

FINDINGS: CT HEAD FINDINGS

Brain: No acute intracranial hemorrhage. No midline shift or mass
effect. Gray-white differentiation maintained. Unremarkable
appearance of the ventricular system.

Vascular: Unremarkable.

Skull: No acute fracture.  No aggressive bone lesion identified.

Sinuses/Orbits: Unremarkable appearance of the orbits. Mastoid air
cells clear. No middle ear effusion. No significant sinus disease.

Other: None

CT CERVICAL SPINE FINDINGS

Alignment: Craniocervical junction aligned. Anatomic alignment of
the cervical elements. No subluxation.

Skull base and vertebrae: No acute fracture at the skullbase.
Vertebral body heights relatively maintained. No acute fracture
identified.

Soft tissues and spinal canal: Unremarkable cervical soft tissues.
Lymph nodes are present, though not enlarged.

Disc levels: Unremarkable appearance of disc space, which are
maintained.

Upper chest: Unremarkable appearance of the lung apices.

Other: No bony canal narrowing.
IMPRESSION: Head CT:

No CT evidence of acute intracranial abnormality.

Cervical CT:

No acute fracture or malalignment of the cervical spine.

## 2020-03-16 IMAGING — DX DG CHEST 1V PORT
1 series · 1 of 1 positions shown · non-contrast
Comparison: Chest radiograph dated 07/27/2019

CLINICAL DATA: 37-year-old female with encephalopathy.

EXAM:
PORTABLE CHEST 1 VIEW

[chest ap]
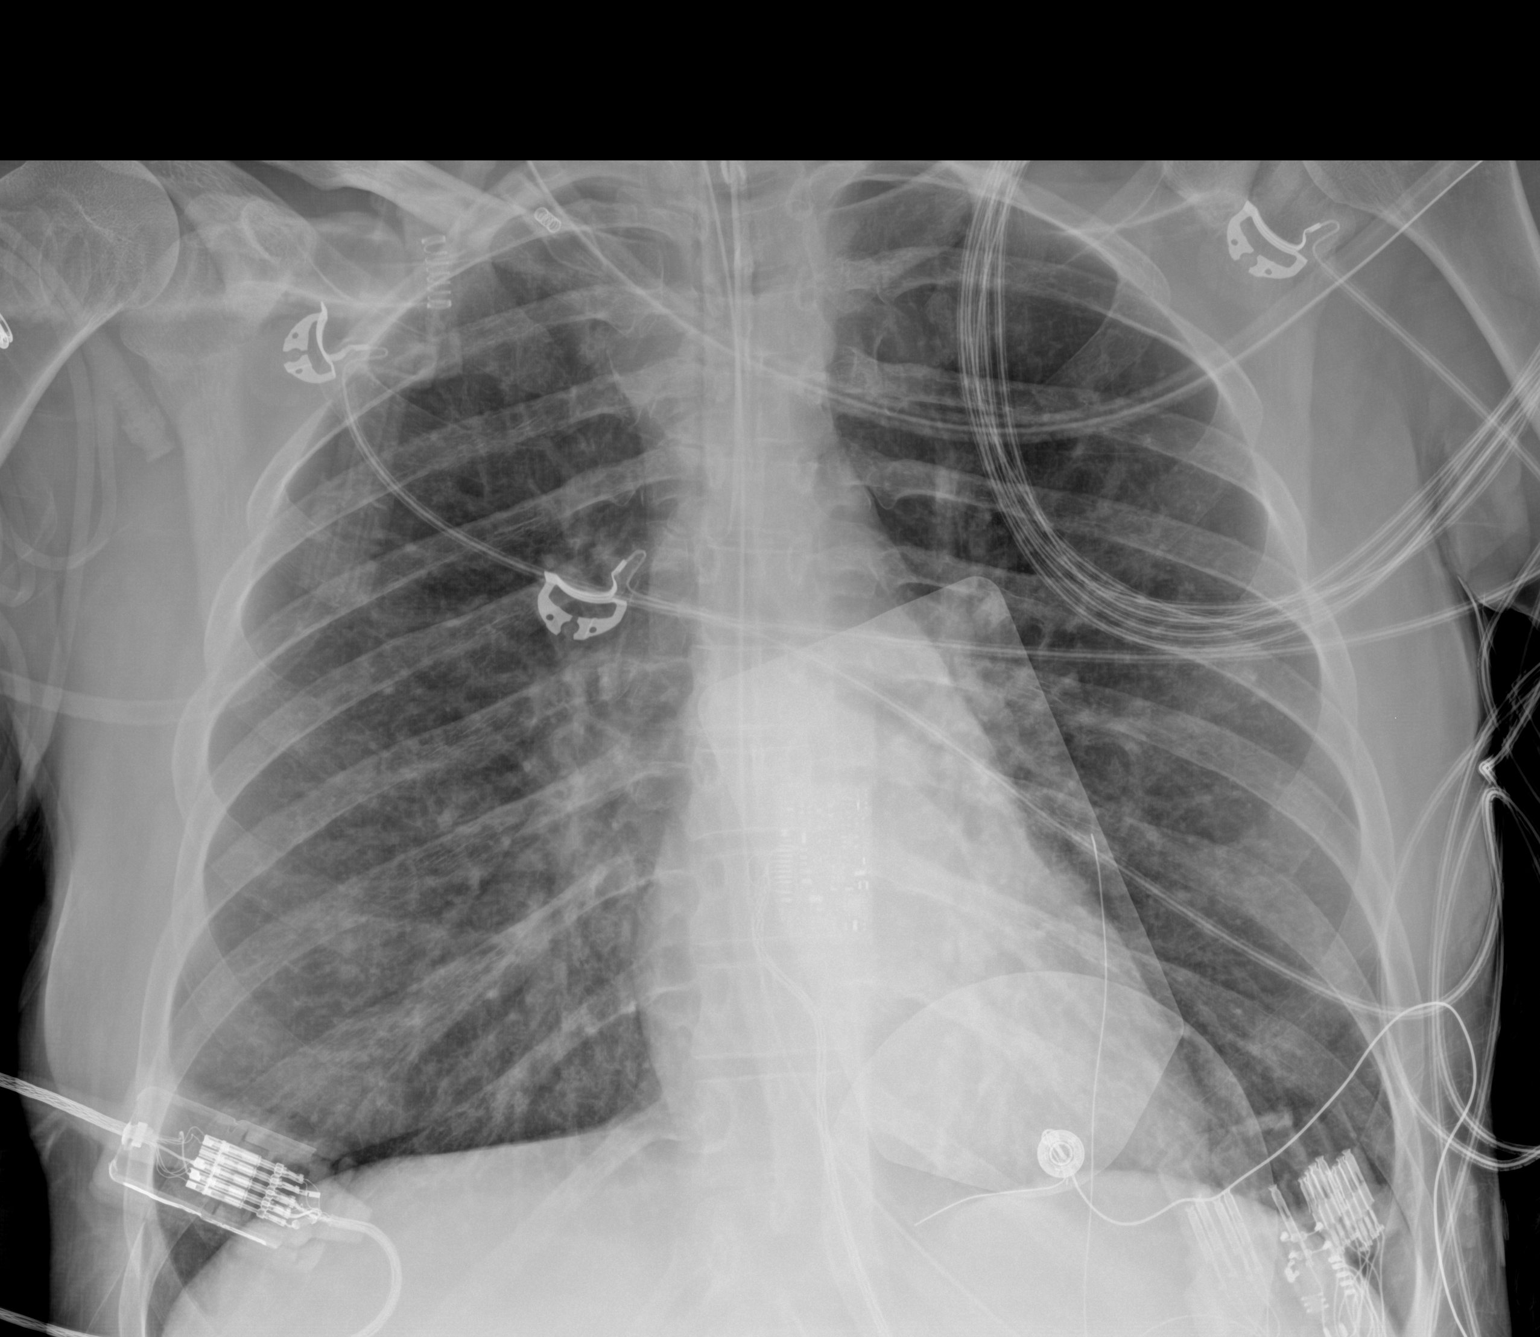

[1 of 1 positions shown; findings below may reference images not displayed]

FINDINGS: Endotracheal tube approximately 2 cm above the carina an enteric
tube extends below the diaphragm with tip beyond the inferior margin
of the image.

No focal consolidation, pleural effusion, or pneumothorax. The
cardiac silhouette is within normal limits. No acute osseous
pathology.
IMPRESSION: No interval change.  Stable positioning of the support apparatus.

## 2020-08-07 ENCOUNTER — Ambulatory Visit
Admission: EM | Admit: 2020-08-07 | Discharge: 2020-08-07 | Disposition: A | Discharge status: Home / Self Care | Payer: Self-pay

## 2020-08-07 ENCOUNTER — Ambulatory Visit: Payer: Self-pay

## 2020-08-07 DIAGNOSIS — M545 Low back pain, unspecified: Secondary | ICD-10-CM

## 2020-08-07 DIAGNOSIS — M62830 Muscle spasm of back: Secondary | ICD-10-CM

## 2020-08-07 MED ORDER — CYCLOBENZAPRINE HCL 10 MG PO TABS: ORAL_TABLET | ORAL | 0 refills | Status: DC

## 2020-08-07 NOTE — ED Provider Notes (Signed)
[facility]    CSN: [id number] Arrival date & time: 08/07/20  1153      History   Chief Complaint Chief Complaint  Patient presents with  . Back Pain    HPI Kristine Allison is a 39 y.o. female.   39 year old female presents with left lower back pain that has been present for the past 2 months. No known injury or trauma. Has noticed muscle just left of midline is sore and pain extends to her left side. Feels like a "knot" in her back. No other radiation of pain. No numbness or tingling of legs. Has tried a heating pad with minimal relief. Has also tried Ibuprofen but that medication irritates her stomach so she rarely takes it. Other chronic health issues include type 1 DM with Insulin use. Has history of diabetic ketoacidosis with kidney injury, tobacco use and anxiety. No other current medications.   The history is provided by the patient.    Past Medical History:  Diagnosis Date  . Anxiety   . Asthma   . Diabetes mellitus without complication (HCC)   . HLKTGYBW(389.3)     Patient Active Problem List   Diagnosis Date Noted  . Hyperkalemia   . AKI (acute kidney injury) (HCC)   . Acute respiratory failure with hypoxia and hypercapnia (HCC)   . High anion gap metabolic acidosis   . Malnutrition of moderate degree 05/09/2019  . DKA, type 1 (HCC) 05/08/2019  . Anxiety 08/07/2018  . DKA (diabetic ketoacidoses) 08/07/2018  . Edema of the tongue 03/26/2018  . Dehydration   . Diabetic ketoacidosis without coma associated with type 1 diabetes mellitus (HCC)   . Yeast infection 02/09/2015  . Acute renal failure syndrome (HCC)   . Tachycardia 11/03/2014  . Insomnia 09/08/2014  . Dizziness 09/08/2014  . Migraine, unspecified 12/14/2013  . Elevated troponin I level 11/19/2013  . Acute myocardial infarction, subendocardial infarction, initial episode of care (HCC) 11/18/2013  . Noncompliance with medication regimen 11/15/2013  . Leg pain 09/08/2013  . Loss  of weight 09/08/2013  . Depression 09/08/2013  . Hypotension, unspecified 09/08/2013  . Tobacco use disorder 08/01/2013  . Diabetes mellitus type 1, uncontrolled (HCC) 06/24/2012  . GERD (gastroesophageal reflux disease) 06/17/2012    Past Surgical History:  Procedure Laterality Date  . NO PAST SURGERIES      OB History   No obstetric history on file.      Home Medications    Prior to Admission medications   Medication Sig Start Date End Date Taking? Authorizing Provider  insulin NPH-regular Human (70-30) 100 UNIT/ML injection Inject into the skin 2 (two) times daily with a meal.   Yes [provider]  cyclobenzaprine (FLEXERIL) 10 MG tablet Take 1 whole tablet by mouth at night as needed. May also take 1/2 tablet every 8 to 12 hours as needed during the day for muscle spasms. 08/07/20   Sudie Grumbling, NP    Family History Family History  Problem Relation Age of Onset  . Heart disease Other        No family history    Social History Social History   Tobacco Use  . Smoking status: Current Every Day Smoker    Packs/day: 0.25    Years: 20.00    Pack years: 5.00    Types: Cigarettes  . Smokeless tobacco: Never Used  Substance Use Topics  . Alcohol use: No    Alcohol/week: 0.0 standard drinks  .  Drug use: No     Allergies   Latex   Review of Systems Review of Systems  Constitutional: Negative for activity change, appetite change, chills, fatigue and fever.  Respiratory: Negative for chest tightness and shortness of breath.   Cardiovascular: Negative for chest pain and palpitations.  Gastrointestinal: Negative for constipation, nausea and vomiting.  Genitourinary: Negative for difficulty urinating, dysuria, flank pain and hematuria.  Musculoskeletal: Positive for arthralgias, back pain and myalgias. Negative for neck pain.  Skin: Negative for color change, rash and wound.  Allergic/Immunologic: Negative for environmental allergies and food  allergies.  Neurological: Negative for dizziness, tremors, seizures, syncope, facial asymmetry, speech difficulty and light-headedness.  Hematological: Negative for adenopathy. Does not bruise/bleed easily.     Physical Exam Triage Vital Signs ED Triage Vitals [08/07/20 1206]  Enc Vitals Group     BP 127/90     Pulse Rate (!) 109     Resp 18     Temp 98.2 F (36.8 C)     Temp Source Oral     SpO2 98 %     Weight      Height      Head Circumference      Peak Flow      Pain Score 4     Pain Loc      Pain Edu?      Excl. in GC?    No data found.  Updated Vital Signs BP 127/90 (BP Location: Left Arm)   Pulse (!) 109   Temp 98.2 F (36.8 C) (Oral)   Resp 18   LMP 10/23/2013   SpO2 98%   Visual Acuity Right Eye Distance:   Left Eye Distance:   Bilateral Distance:    Right Eye Near:   Left Eye Near:    Bilateral Near:     Physical Exam Vitals and nursing note reviewed.  Constitutional:      General: She is awake. She is not in acute distress.    Appearance: She is well-developed and well-groomed.     Comments: She is sitting in the exam chair in no acute distress but appears slightly uncomfortable due to pain and appears older than stated age.   HENT:     Head: Normocephalic and atraumatic.  Eyes:     Extraocular Movements: Extraocular movements intact.     Conjunctiva/sclera: Conjunctivae normal.  Cardiovascular:     Rate and Rhythm: Normal rate and regular rhythm.     Heart sounds: Normal heart sounds. No murmur heard.      Comments: Heart rate = 98 during exam.  Pulmonary:     Effort: Pulmonary effort is normal. No respiratory distress.     Breath sounds: Normal air entry. No decreased air movement. Examination of the right-upper field reveals wheezing. Examination of the left-upper field reveals wheezing. Wheezing present. No decreased breath sounds or rhonchi.  Musculoskeletal:        General: Tenderness present. No swelling. Normal range of motion.      Cervical back: Normal, normal range of motion and neck supple. No rigidity.     Thoracic back: Normal.     Lumbar back: Tenderness present. No swelling or deformity. Normal range of motion. Negative right straight leg raise test and negative left straight leg raise test.       Back:     Comments: Heart tattoo present at midline lower lumbar area with wings and pain starts at top edge of left wing. Has full range of  motion of back with minimal pain. Very tender along left lower lumbar area latissimus dorsi muscle and exterior oblique. Muscle tightness and spasms present. No distinct swelling. No warmth. No rash or redness. Negative SLR. No neuro deficits noted.   Skin:    General: Skin is warm and dry.     Capillary Refill: Capillary refill takes less than 2 seconds.     Findings: No rash.  Neurological:     General: No focal deficit present.     Mental Status: She is alert and oriented to person, place, and time.     Sensory: Sensation is intact. No sensory deficit.     Motor: Motor function is intact.     Gait: Gait is intact.     Deep Tendon Reflexes: Reflexes are normal and symmetric.  Psychiatric:        Mood and Affect: Mood normal.        Behavior: Behavior normal. Behavior is cooperative.        Thought Content: Thought content normal.        Judgment: Judgment normal.      UC Treatments / Results  Labs (all labs ordered are listed, but only abnormal results are displayed) Labs Reviewed - No data to display  EKG   Radiology No results found.  Procedures Procedures (including critical care time)  Medications Ordered in UC Medications - No data to display  Initial Impression / Assessment and Plan / UC Course  I have reviewed the triage vital signs and the nursing notes.  Pertinent labs & imaging results that were available during my care of the patient were reviewed by me and considered in my medical decision making (see chart for details).    Reviewed with  patient that she appears to have lumbar muscle strain with spasms. Do not feel imaging is needed at this time. Will trial a muscle relaxer. May take Flexeril 10mg  one whole tablet at night as needed. May also take 1/2 tablet every 8 to 12 hours during the day as needed- may cause drowsiness. Encouraged to perform back exercises and continue to apply heat to area for comfort. May also try OTC Voltaren gel 3 to 4 times a day as needed. Recommend follow-up in 7 to 10 days if not improving.   Final Clinical Impressions(s) / UC Diagnoses   Final diagnoses:  Acute left-sided low back pain without sciatica  Spasm of muscle of lower back     Discharge Instructions     Recommend recommend take Flexeril muscle relaxer- take 1 whole tablet at night as needed. May also take 1/2 tablet every 8 to 12 hours as needed during the day. May apply OTC Voltaren gel 3 to 4 times a day to affected area as needed. Follow-up in 7 to 10 days if not improving.     ED Prescriptions    Medication Sig Dispense Auth. Provider   cyclobenzaprine (FLEXERIL) 10 MG tablet Take 1 whole tablet by mouth at night as needed. May also take 1/2 tablet every 8 to 12 hours as needed during the day for muscle spasms. 20 tablet Debhora Titus, , NP     PDMP was reviewed this encounter. Reviewed [place] Controlled Substance site to confirm no other medications, particularly opioids have been prescribed. Last controlled medication was for Percocet in November 2020. Do not feel a controlled medication is needed at this time.    December 2020, NP 08/08/20 1101

## 2020-08-07 NOTE — Discharge Instructions (Addendum)
Recommend recommend take Flexeril muscle relaxer- take 1 whole tablet at night as needed. May also take 1/2 tablet every 8 to 12 hours as needed during the day. May apply OTC Voltaren gel 3 to 4 times a day to affected area as needed. Follow-up in 7 to 10 days if not improving.

## 2020-08-07 NOTE — ED Triage Notes (Signed)
Pt c/o lt side/lower back pain for over 2 months. States feels like a knot to lt side, tender to touch. States at work she has to wear a heavy belt and thinks it's coming from that. Denies any known injury.

## 2023-03-30 ENCOUNTER — Encounter: Payer: Self-pay | Admitting: Emergency Medicine

## 2023-03-30 ENCOUNTER — Emergency Department
Admission: EM | Admit: 2023-03-30 | Discharge: 2023-03-31 | Disposition: A | Discharge status: Home / Self Care | Payer: 59 | Attending: Emergency Medicine | Admitting: Emergency Medicine

## 2023-03-30 ENCOUNTER — Other Ambulatory Visit: Payer: Self-pay

## 2023-03-30 ENCOUNTER — Emergency Department: Payer: 59

## 2023-03-30 DIAGNOSIS — M5441 Lumbago with sciatica, right side: Secondary | ICD-10-CM | POA: Diagnosis not present

## 2023-03-30 DIAGNOSIS — M5431 Sciatica, right side: Secondary | ICD-10-CM

## 2023-03-30 DIAGNOSIS — M545 Low back pain, unspecified: Secondary | ICD-10-CM | POA: Diagnosis present

## 2023-03-30 MED ORDER — HYDROCODONE-ACETAMINOPHEN 5-325 MG PO TABS
1.0000 | ORAL_TABLET | Freq: Once | ORAL | Status: AC
  Administered 2023-03-31: 1 via ORAL
  Filled 2023-03-30: qty 1

## 2023-03-30 MED ORDER — METHOCARBAMOL 500 MG PO TABS: 500.0000 mg | ORAL_TABLET | Freq: Four times a day (QID) | ORAL | 0 refills | Status: DC

## 2023-03-30 MED ORDER — MELOXICAM 15 MG PO TABS: 15.0000 mg | ORAL_TABLET | Freq: Every day | ORAL | 0 refills | Status: DC

## 2023-03-30 MED ORDER — PREDNISONE 10 MG PO TABS: 10.0000 mg | ORAL_TABLET | ORAL | 0 refills | Status: DC

## 2023-03-30 MED ORDER — KETOROLAC TROMETHAMINE 30 MG/ML IJ SOLN
30.0000 mg | Freq: Once | INTRAMUSCULAR | Status: AC
  Administered 2023-03-31: 30 mg via INTRAMUSCULAR
  Filled 2023-03-30: qty 1

## 2023-03-30 MED ORDER — DIAZEPAM 2 MG PO TABS
2.0000 mg | ORAL_TABLET | Freq: Once | ORAL | Status: AC
  Administered 2023-03-31: 2 mg via ORAL
  Filled 2023-03-30: qty 1

## 2023-03-30 MED ORDER — DEXAMETHASONE SODIUM PHOSPHATE 10 MG/ML IJ SOLN
10.0000 mg | Freq: Once | INTRAMUSCULAR | Status: AC
  Administered 2023-03-31: 10 mg via INTRAMUSCULAR
  Filled 2023-03-30: qty 1

## 2023-03-30 NOTE — ED Triage Notes (Signed)
Pt presents ambulatory to triage via POV with complaints of lower back pain with radiation to L leg for 6 weeks. Notes taking OTC pain meds without any relief. A&Ox4 at this time. Denies urinary sx, falls, CP or SOB.

## 2023-03-30 NOTE — ED Provider Notes (Signed)
[facility] Provider Note  Patient Contact: 11:35 PM (approximate)   History   Back Pain   HPI  Kristine Allison is a 42 y.o. female who presents emergency department with right-sided lower back pain radiating down the right leg.  Patient states that roughly 6 weeks ago she was carrying some heavy items at work, the next day she woke up with some soreness in her back.  This has been progressively tightening and causing more symptoms.  No bowel or bladder dysfunction, saddle anesthesia or paresthesias.  No direct trauma to the spine.  No falls.  Patient states that she tried some muscle relaxers but it primarily made her drowsy and she cannot use them at work so she has not been using the muscle relaxer.  No other medications prior to arrival.  No other complaints at this time.     Physical Exam   Triage Vital Signs: ED Triage Vitals  Enc Vitals Group     BP 03/30/23 2159 (!) 141/80     Pulse Rate 03/30/23 2159 (!) 118     Resp 03/30/23 2159 20     Temp 03/30/23 2159 98.4 F (36.9 C)     Temp Source 03/30/23 2159 Oral     SpO2 03/30/23 2159 100 %     Weight 03/30/23 2158 123 lb (55.8 kg)     Height 03/30/23 2158 5\' 4"  (1.626 m)     Head Circumference --      Peak Flow --      Pain Score 03/30/23 2158 9     Pain Loc --      Pain Edu? --      Excl. in GC? --     Most recent vital signs: Vitals:   03/30/23 2159  BP: (!) 141/80  Pulse: (!) 118  Resp: 20  Temp: 98.4 F (36.9 C)  SpO2: 100%     General: Alert and in no acute distress.  Cardiovascular:  Good peripheral perfusion Respiratory: Normal respiratory effort without tachypnea or retractions. Lungs CTAB.  Musculoskeletal: Full range of motion to all extremities.  Visualization of the lumbar spine reveals no visible signs of trauma.  Tenderness in the right paraspinal muscle group but no midline tenderness.  No palpable abnormality or step-off.  No tenderness into the sciatic notch.   Pulses sensation intact and equal bilateral lower extremities. Neurologic:  No gross focal neurologic deficits are appreciated.  Skin:   No rash noted Other:   ED Results / Procedures / Treatments   Labs (all labs ordered are listed, but only abnormal results are displayed) Labs Reviewed - No data to display   EKG     RADIOLOGY  I personally viewed, evaluated, and interpreted these images as part of my medical decision making, as well as reviewing the written report by the radiologist.  ED Provider Interpretation: No acute traumatic finding on x-ray.  DG Lumbar Spine Complete  Result Date: 03/30/2023 CLINICAL DATA:  Low back pain EXAM: LUMBAR SPINE - COMPLETE 4+ VIEW COMPARISON:  None Available. FINDINGS: There is no evidence of lumbar spine fracture. Alignment is normal. Intervertebral disc spaces are maintained. IMPRESSION: Negative. Electronically Signed   By: Jasmine Pang M.D.   On: 03/30/2023 22:20    PROCEDURES:  Critical Care performed: No  Procedures   MEDICATIONS ORDERED IN ED: Medications  diazepam (VALIUM) tablet 2 mg (has no administration in time range)  ketorolac (TORADOL) 30 MG/ML injection 30 mg (has no  administration in time range)  dexamethasone (DECADRON) injection 10 mg (has no administration in time range)  HYDROcodone-acetaminophen (NORCO/VICODIN) 5-325 MG per tablet 1 tablet (has no administration in time range)     IMPRESSION / MDM / ASSESSMENT AND PLAN / ED COURSE  I reviewed the triage vital signs and the nursing notes.                                 Differential diagnosis includes, but is not limited to, sciatica, lumbar strain, herniated disc, compression fracture   Patient's presentation is most consistent with acute presentation with potential threat to life or bodily function.   Patient's diagnosis is consistent with sciatica.  Patient presents emergency department lower back pain radiating down the right leg.  Symptoms started  after lifting some heavy items at work.  No direct trauma.  No concerning neurodeficits.  X-ray revealed no acute traumatic finding.  Patiently placed on anti-inflammatory, steroid, muscle relaxer at home.  Follow-up with primary care as needed.  Return precautions discussed with the patient..  Patient is given ED precautions to return to the ED for any worsening or new symptoms.     FINAL CLINICAL IMPRESSION(S) / ED DIAGNOSES   Final diagnoses:  Sciatica of right side     Rx / DC Orders   ED Discharge Orders          Ordered    predniSONE (DELTASONE) 10 MG tablet  As directed       Note to Pharmacy: Take on a pattern of 6, 6, 5, 5, 4, 4, 3, 3, 2, 2, 1, 1   03/30/23 2350    meloxicam (MOBIC) 15 MG tablet  Daily        03/30/23 2350    methocarbamol (ROBAXIN) 500 MG tablet  4 times daily        03/30/23 2350             Note:  This document was prepared using Dragon voice recognition software and may include unintentional dictation errors.   Lanette Hampshire 03/30/23 2350    Jene Every, MD 04/01/23 [phone]

## 2023-04-04 ENCOUNTER — Other Ambulatory Visit: Payer: Self-pay

## 2023-04-04 ENCOUNTER — Emergency Department
Admission: EM | Admit: 2023-04-04 | Discharge: 2023-04-04 | Disposition: A | Discharge status: Home / Self Care | Payer: 59 | Attending: Emergency Medicine | Admitting: Emergency Medicine

## 2023-04-04 DIAGNOSIS — E119 Type 2 diabetes mellitus without complications: Secondary | ICD-10-CM | POA: Diagnosis not present

## 2023-04-04 DIAGNOSIS — M5416 Radiculopathy, lumbar region: Secondary | ICD-10-CM | POA: Insufficient documentation

## 2023-04-04 DIAGNOSIS — J45909 Unspecified asthma, uncomplicated: Secondary | ICD-10-CM | POA: Diagnosis not present

## 2023-04-04 DIAGNOSIS — M545 Low back pain, unspecified: Secondary | ICD-10-CM | POA: Diagnosis present

## 2023-04-04 MED ORDER — LIDOCAINE 5 % EX PTCH
1.0000 | MEDICATED_PATCH | CUTANEOUS | Status: DC
  Administered 2023-04-04: 1 via TRANSDERMAL
  Filled 2023-04-04: qty 1

## 2023-04-04 MED ORDER — LIDOCAINE 5 % EX PTCH: 1.0000 | MEDICATED_PATCH | Freq: Two times a day (BID) | CUTANEOUS | 0 refills | Status: DC

## 2023-04-04 MED ORDER — KETOROLAC TROMETHAMINE 30 MG/ML IJ SOLN
30.0000 mg | Freq: Once | INTRAMUSCULAR | Status: AC
  Administered 2023-04-04: 30 mg via INTRAMUSCULAR
  Filled 2023-04-04: qty 1

## 2023-04-04 NOTE — ED Triage Notes (Signed)
Pt co of back pain x 1 month ago. Pt was told on 03/30/2023 she had sciatica. Pt states today she heard a pop while moving, pt states pain radiates down right side to her knee. Pt is A&Ox4.

## 2023-04-04 NOTE — ED Provider Notes (Signed)
[facility] Provider Note    Event Date/Time   First MD Initiated Contact with Patient 04/04/23 1235     (approximate)   History   Chief Complaint Back Pain   HPI  Kristine Allison Weckwerth is a 42 y.o. female with past medical history of diabetes and asthma who presents to the ED complaining of back pain.  Patient reports that she deals with occasional "sciatica" moving down the right side of her back, had acute worsening of this pain earlier today when she bent over and felt a "pop."  She denies any numbness or weakness in the leg, has not had any saddle anesthesia or incontinence.  She was seen in the ED for similar symptoms 5 days ago and prescribed muscle relaxants and NSAIDs, reports partial relief with these.      Physical Exam   Triage Vital Signs: ED Triage Vitals  Encounter Vitals Group     BP 04/04/23 1230 (!) 144/76     Systolic BP Percentile --      Diastolic BP Percentile --      Pulse Rate 04/04/23 1230 76     Resp 04/04/23 1230 18     Temp 04/04/23 1230 98.4 F (36.9 C)     Temp Source 04/04/23 1230 Oral     SpO2 04/04/23 1230 99 %     Weight 04/04/23 1229 123 lb (55.8 kg)     Height 04/04/23 1229 5\' 4"  (1.626 m)     Head Circumference --      Peak Flow --      Pain Score 04/04/23 1229 10     Pain Loc --      Pain Education --      Exclude from Growth Chart --     Most recent vital signs: Vitals:   04/04/23 1230  BP: (!) 144/76  Pulse: 76  Resp: 18  Temp: 98.4 F (36.9 C)  SpO2: 99%    Constitutional: Alert and oriented. Eyes: Conjunctivae are normal. Head: Atraumatic. Nose: No congestion/rhinnorhea. Mouth/Throat: Mucous membranes are moist.  Cardiovascular: Normal rate, regular rhythm. Grossly normal heart sounds.  2+ radial and DP pulses bilaterally. Respiratory: Normal respiratory effort.  No retractions. Lungs CTAB. Gastrointestinal: Soft and nontender. No distention. Musculoskeletal: No lower extremity  tenderness nor edema.  No midline lumbar spinal tenderness to palpation. Neurologic:  Normal speech and language. No gross focal neurologic deficits are appreciated.    ED Results / Procedures / Treatments   Labs (all labs ordered are listed, but only abnormal results are displayed) Labs Reviewed - No data to display   PROCEDURES:  Critical Care performed: No  Procedures   MEDICATIONS ORDERED IN ED: Medications  ketorolac (TORADOL) 30 MG/ML injection 30 mg (has no administration in time range)  lidocaine (LIDODERM) 5 % 1 patch (has no administration in time range)     IMPRESSION / MDM / ASSESSMENT AND PLAN / ED COURSE  I reviewed the triage vital signs and the nursing notes.                              42 y.o. female with past medical history of diabetes and asthma who presents to the ED with acute on chronic pain in her right lower back radiating down her right leg.  Patient's presentation is most consistent with acute, uncomplicated illness.  Differential diagnosis includes, but is not limited to, lumbar radiculopathy, lumbar  strain, cauda equina.  Patient nontoxic-appearing and in no acute distress, vital signs are unremarkable.  She is neurovascular intact to her bilateral lower extremities, no findings concerning for cauda equina.  We will treat symptomatically with IM Toradol and Lidoderm patch.  Patient appropriate for discharge home with outpatient management, was provided with referral to establish care with PCP.  She was counseled to return to the ED for new or worsening symptoms.  Patient agrees with plan.      FINAL CLINICAL IMPRESSION(S) / ED DIAGNOSES   Final diagnoses:  Lumbar radiculopathy     Rx / DC Orders   ED Discharge Orders          Ordered    lidocaine (LIDODERM) 5 %  Every 12 hours        04/04/23 1303             Note:  This document was prepared using Dragon voice recognition software and may include unintentional dictation  errors.   Chesley Noon, MD 04/04/23 1310

## 2023-04-11 ENCOUNTER — Emergency Department: Payer: 59

## 2023-04-11 ENCOUNTER — Other Ambulatory Visit: Payer: Self-pay

## 2023-04-11 ENCOUNTER — Emergency Department
Admission: EM | Admit: 2023-04-11 | Discharge: 2023-04-11 | Disposition: A | Discharge status: Home / Self Care | Payer: 59 | Attending: Emergency Medicine | Admitting: Emergency Medicine

## 2023-04-11 DIAGNOSIS — M5431 Sciatica, right side: Secondary | ICD-10-CM | POA: Diagnosis not present

## 2023-04-11 DIAGNOSIS — E119 Type 2 diabetes mellitus without complications: Secondary | ICD-10-CM | POA: Insufficient documentation

## 2023-04-11 DIAGNOSIS — M545 Low back pain, unspecified: Secondary | ICD-10-CM | POA: Diagnosis present

## 2023-04-11 DIAGNOSIS — M5126 Other intervertebral disc displacement, lumbar region: Secondary | ICD-10-CM

## 2023-04-11 LAB — BASIC METABOLIC PANEL
Anion gap: 12 (ref 5–15)
BUN: 21 mg/dL — ABNORMAL HIGH (ref 6–20)
CO2: 21 mmol/L — ABNORMAL LOW (ref 22–32)
Calcium: 8.4 mg/dL — ABNORMAL LOW (ref 8.9–10.3)
Chloride: 99 mmol/L (ref 98–111)
Creatinine, Ser: 0.87 mg/dL (ref 0.44–1.00)
GFR, Estimated: >60 mL/min (ref >60)
Glucose, Bld: 358 mg/dL — ABNORMAL HIGH (ref 70–99)
Potassium: 4.4 mmol/L (ref 3.5–5.1)
Sodium: 132 mmol/L — ABNORMAL LOW (ref 135–145)

## 2023-04-11 LAB — CBC
HCT: 42.8 % (ref 36.0–46.0)
Hemoglobin: 14.7 g/dL (ref 12.0–15.0)
MCH: 31.9 pg (ref 26.0–34.0)
MCHC: 34.3 g/dL (ref 30.0–36.0)
MCV: 92.8 fL (ref 80.0–100.0)
Platelets: 221 10*3/uL (ref 150–400)
RBC: 4.61 MIL/uL (ref 3.87–5.11)
RDW: 13.1 % (ref 11.5–15.5)
WBC: 12.8 10*3/uL — ABNORMAL HIGH (ref 4.0–10.5)
nRBC: 0 % (ref 0.0–0.2)

## 2023-04-11 LAB — TROPONIN I (HIGH SENSITIVITY): Troponin I (High Sensitivity): 3 ng/L (ref <18)

## 2023-04-11 MED ORDER — MORPHINE SULFATE (PF) 4 MG/ML IV SOLN
4.0000 mg | Freq: Once | INTRAVENOUS | Status: AC
  Administered 2023-04-11: 4 mg via INTRAVENOUS

## 2023-04-11 MED ORDER — ONDANSETRON HCL 4 MG/2ML IJ SOLN
4.0000 mg | Freq: Once | INTRAMUSCULAR | Status: AC
  Administered 2023-04-11: 4 mg via INTRAVENOUS
  Filled 2023-04-11: qty 2

## 2023-04-11 MED ORDER — KETOROLAC TROMETHAMINE 30 MG/ML IJ SOLN
30.0000 mg | Freq: Once | INTRAMUSCULAR | Status: AC
  Administered 2023-04-11: 30 mg via INTRAVENOUS
  Filled 2023-04-11: qty 1

## 2023-04-11 MED ORDER — MORPHINE SULFATE (PF) 4 MG/ML IV SOLN
INTRAVENOUS | Status: AC
  Filled 2023-04-11: qty 1

## 2023-04-11 MED ORDER — OXYCODONE-ACETAMINOPHEN 5-325 MG PO TABS: 1.0000 | ORAL_TABLET | ORAL | 0 refills | Status: DC | PRN

## 2023-04-11 NOTE — ED Triage Notes (Signed)
Pt to ed from home via [facility] for CP. Pt is caox4, in no acute distress in triage. Pt was given ASA by EMS.

## 2023-04-11 NOTE — Discharge Instructions (Signed)
Please seek medical attention for any high fevers, chest pain, shortness of breath, change in behavior, persistent vomiting, bloody stool or any other new or concerning symptoms.  

## 2023-04-11 NOTE — ED Provider Triage Note (Signed)
Emergency Medicine Provider Triage Evaluation Note  Kristine Allison , a 42 y.o. female  was evaluated in triage.  Pt complains of cp started today, no cardiac history.  Review of Systems  Positive:  Negative:   Physical Exam  Pulse 100   Temp 98 F (36.7 C) (Oral)   Resp 16   Ht 5\' 4"  (1.626 m)   Wt 55 kg   LMP 06/11/2013 (Approximate)   SpO2 99%   BMI 20.81 kg/m  Gen:   Awake, no distress   Resp:  Normal effort  MSK:   Moves extremities without difficulty  Other:    Medical Decision Making  Medically screening exam initiated at 4:50 PM.  Appropriate orders placed.  Kristine Allison was informed that the remainder of the evaluation will be completed by another provider, this initial triage assessment does not replace that evaluation, and the importance of remaining in the ED until their evaluation is complete.     Faythe Ghee, PA-C 04/11/23 1651

## 2023-04-11 NOTE — ED Provider Notes (Signed)
MRI shows disc protrusion with compression of s1 nerve root. Do think this explains the patient's pain. Discussed with patient. Will plan on discharging with pain medication and neurosurgery follow up.   Phineas Semen, MD 04/11/23 2214

## 2023-04-11 NOTE — ED Notes (Signed)
Pt to ER via EMS form home.  Pt is being treated for sciatica pain with muscle relaxers and steroids.  Pt states today left sided chest pain that is non radiating.  Denies n/v/shob at this time.  324 of ASA by EMS, ems VSS, EKG WNL. Pt has hx of DM, EMS CBG 400.

## 2023-04-11 NOTE — ED Provider Notes (Signed)
[facility] Provider Note    Event Date/Time   First MD Initiated Contact with Patient 04/11/23 1844     (approximate)   History   Back pain, chest pain   HPI  Kristine Allison is a 42 y.o. female with a history of diabetes who presents with primary complaint of low back pain with severe pain radiating to her foot on the right.  She reports this has been going on intermittently over the last month.  Review of record demonstrates she saw orthopedics 3 days ago, outpatient MRI has been scheduled.  She reports pain has worsened suddenly and it is very painful to bear any weight on her right leg.  No loss of bowel or bladder continence.  No fever.  Additionally she reports that she had chest tightness earlier but after EMS gave her aspirin that resolved, she has no concerns about that at this time.  No shortness of breath.     Physical Exam   Triage Vital Signs: ED Triage Vitals  Encounter Vitals Group     BP 04/11/23 1651 (!) 127/97     Systolic BP Percentile --      Diastolic BP Percentile --      Pulse Rate 04/11/23 1649 100     Resp 04/11/23 1649 16     Temp 04/11/23 1649 98 F (36.7 C)     Temp Source 04/11/23 1649 Oral     SpO2 04/11/23 1649 99 %     Weight 04/11/23 1650 55 kg (121 lb 4.1 oz)     Height 04/11/23 1650 1.626 m (5\' 4" )     Head Circumference --      Peak Flow --      Pain Score 04/11/23 1650 0     Pain Loc --      Pain Education --      Exclude from Growth Chart --     Most recent vital signs: Vitals:   04/11/23 1649 04/11/23 1651  BP:  (!) 127/97  Pulse: 100   Resp: 16   Temp: 98 F (36.7 C)   SpO2: 99%      General: Awake, no distress.  CV:  Good peripheral perfusion.  Resp:  Normal effort.  Abd:  No distention.  Other:  Normal pulses distally in the right lower extremity, warm and well-perfused   ED Results / Procedures / Treatments   Labs (all labs ordered are listed, but only abnormal results are  displayed) Labs Reviewed  BASIC METABOLIC PANEL - Abnormal; Notable for the following components:      Result Value   Sodium 132 (*)    CO2 21 (*)    Glucose, Bld 358 (*)    BUN 21 (*)    Calcium 8.4 (*)    All other components within normal limits  CBC - Abnormal; Notable for the following components:   WBC 12.8 (*)    All other components within normal limits  TROPONIN I (HIGH SENSITIVITY)     EKG  ED ECG REPORT I, Jene Every, the attending physician, personally viewed and interpreted this ECG.  Date: 04/11/2023  Rhythm: normal sinus rhythm QRS Axis: normal Intervals: normal ST/T Wave abnormalities: normal Narrative Interpretation: no evidence of acute ischemia    RADIOLOGY Chest x-ray viewed interpreted by me,    PROCEDURES:  Critical Care performed:   Procedures   MEDICATIONS ORDERED IN ED: Medications  morphine (PF) 4 MG/ML injection 4 mg (4 mg  Intravenous Not Given 04/11/23 1938)  ondansetron [facility]) injection 4 mg (4 mg Intravenous Given 04/11/23 1934)  ketorolac (TORADOL) 30 MG/ML injection 30 mg (30 mg Intravenous Given 04/11/23 1934)     IMPRESSION / MDM / ASSESSMENT AND PLAN / ED COURSE  I reviewed the triage vital signs and the nursing notes. Patient's presentation is most consistent with acute presentation with potential threat to life or bodily function.   Patient presents with 2 separate complaints as detailed above, chest pain resolved and she seems unconcerned about this.  Regardless EKG, high sensitive troponin, chest x-ray are reassuring, it does not appear consistent with ACS.  She is more concerned about low back pain with severe pain in her right leg as well.  Concerning for sciatica versus herniated disc.  Will treat with IV morphine, IV Zofran, will obtain MRI given severe pain.       FINAL CLINICAL IMPRESSION(S) / ED DIAGNOSES   Final diagnoses:  Sciatica of right side     Rx / DC Orders   ED Discharge Orders      None        Note:  This document was prepared using Dragon voice recognition software and may include unintentional dictation errors.   Jene Every, MD 04/11/23 [phone]

## 2023-04-14 ENCOUNTER — Other Ambulatory Visit: Payer: Self-pay | Admitting: Student

## 2023-04-14 DIAGNOSIS — M48061 Spinal stenosis, lumbar region without neurogenic claudication: Secondary | ICD-10-CM

## 2023-04-14 DIAGNOSIS — M5416 Radiculopathy, lumbar region: Secondary | ICD-10-CM

## 2023-04-14 DIAGNOSIS — M4807 Spinal stenosis, lumbosacral region: Secondary | ICD-10-CM

## 2023-04-16 ENCOUNTER — Other Ambulatory Visit: Payer: Self-pay | Admitting: Student

## 2023-04-16 ENCOUNTER — Encounter: Payer: Self-pay | Admitting: Student

## 2023-04-16 DIAGNOSIS — M5416 Radiculopathy, lumbar region: Secondary | ICD-10-CM

## 2023-04-17 ENCOUNTER — Ambulatory Visit
Admission: RE | Admit: 2023-04-17 | Discharge: 2023-04-17 | Disposition: A | Discharge status: Home / Self Care | Payer: 59 | Source: Ambulatory Visit | Attending: Student | Admitting: Student

## 2023-04-17 DIAGNOSIS — M5416 Radiculopathy, lumbar region: Secondary | ICD-10-CM

## 2023-04-17 MED ORDER — METHYLPREDNISOLONE ACETATE 40 MG/ML INJ SUSP (RADIOLOG
80.0000 mg | Freq: Once | INTRAMUSCULAR | Status: AC
  Administered 2023-04-17: 80 mg via EPIDURAL

## 2023-04-17 MED ORDER — IOPAMIDOL (ISOVUE-M 200) INJECTION 41%
1.0000 mL | Freq: Once | INTRAMUSCULAR | Status: AC
  Administered 2023-04-17: 1 mL via EPIDURAL

## 2023-04-17 NOTE — Discharge Instructions (Signed)

## 2023-05-15 ENCOUNTER — Other Ambulatory Visit: Payer: Self-pay | Admitting: Student

## 2023-05-15 DIAGNOSIS — M5416 Radiculopathy, lumbar region: Secondary | ICD-10-CM

## 2023-08-27 ENCOUNTER — Other Ambulatory Visit: Payer: Self-pay

## 2023-08-27 DIAGNOSIS — M5431 Sciatica, right side: Secondary | ICD-10-CM | POA: Insufficient documentation

## 2023-08-27 DIAGNOSIS — M25559 Pain in unspecified hip: Secondary | ICD-10-CM | POA: Diagnosis present

## 2023-08-27 NOTE — ED Triage Notes (Signed)
Pt reports R hip pain x 4 days. Denies fall or injury. Reports hx of herniated disc and sciatica. States pain feels the same. Ambulatory to triage. Alert and oriented. Breathing unlabored speaking in full sentences.

## 2023-08-28 ENCOUNTER — Emergency Department
Admission: EM | Admit: 2023-08-28 | Discharge: 2023-08-28 | Disposition: A | Discharge status: Home / Self Care | Payer: 59 | Attending: Emergency Medicine | Admitting: Emergency Medicine

## 2023-08-28 DIAGNOSIS — M5431 Sciatica, right side: Secondary | ICD-10-CM

## 2023-08-28 MED ORDER — ACETAMINOPHEN 500 MG PO TABS
1000.0000 mg | ORAL_TABLET | Freq: Once | ORAL | Status: AC
  Administered 2023-08-28: 1000 mg via ORAL
  Filled 2023-08-28: qty 2

## 2023-08-28 MED ORDER — METHOCARBAMOL 500 MG PO TABS: 500.0000 mg | ORAL_TABLET | Freq: Three times a day (TID) | ORAL | 0 refills | Status: DC | PRN

## 2023-08-28 MED ORDER — KETOROLAC TROMETHAMINE 30 MG/ML IJ SOLN
30.0000 mg | Freq: Once | INTRAMUSCULAR | Status: AC
  Administered 2023-08-28: 30 mg via INTRAMUSCULAR
  Filled 2023-08-28: qty 1

## 2023-08-28 MED ORDER — LIDOCAINE 5 % EX PTCH
1.0000 | MEDICATED_PATCH | CUTANEOUS | Status: DC
  Administered 2023-08-28: 1 via TRANSDERMAL
  Filled 2023-08-28: qty 1

## 2023-08-28 MED ORDER — METHOCARBAMOL 500 MG PO TABS
500.0000 mg | ORAL_TABLET | Freq: Once | ORAL | Status: AC
  Administered 2023-08-28: 500 mg via ORAL
  Filled 2023-08-28: qty 1

## 2023-08-28 NOTE — Discharge Instructions (Addendum)
Please take Tylenol and ibuprofen/Advil for your pain.  It is safe to take them together, or to alternate them every few hours.  Take up to 1000mg  of Tylenol at a time, up to 4 times per day.  Do not take more than 4000 mg of Tylenol in 24 hours.  For ibuprofen, take 400-600 mg, 3 - 4 times per day.  Please use lidocaine patches at your site of pain.  Apply 1 patch at a time, leave on for 12 hours, then remove for 12 hours.  12 hours on, 12 hours off.  Do not apply more than 1 patch at a time.  Use Robaxin muscle relaxer as needed for more severe/breakthrough pain, up to 3 times per day. This medication can make some people sleepy, so do not use while driving, working or Designer, television/film set

## 2023-08-28 NOTE — ED Provider Notes (Signed)
[facility] Provider Note    Event Date/Time   First MD Initiated Contact with Patient 08/28/23 [phone]     (approximate)   History   Hip Pain   HPI  Kristine Allison is a 42 y.o. female who presents to the ED for evaluation of Hip Pain   Patient presents with acute on chronic right sided sciatica.  No fevers, urinary or stool changes or saddle anesthesias.  No trauma or falls.  Symptoms have been worse for the past 4 to 5 days.  Has not taken any medications today.    Physical Exam   Triage Vital Signs: ED Triage Vitals  Encounter Vitals Group     BP 08/27/23 2028 132/81     Systolic BP Percentile --      Diastolic BP Percentile --      Pulse Rate 08/27/23 2028 89     Resp 08/27/23 2028 18     Temp 08/27/23 2028 98.3 F (36.8 C)     Temp Source 08/27/23 2028 Oral     SpO2 08/27/23 2028 99 %     Weight 08/27/23 2027 121 lb (54.9 kg)     Height 08/27/23 2027 5\' 4"  (1.626 m)     Head Circumference --      Peak Flow --      Pain Score 08/27/23 2027 8     Pain Loc --      Pain Education --      Exclude from Growth Chart --     Most recent vital signs: Vitals:   08/27/23 2028 08/28/23 0104  BP: 132/81 110/86  Pulse: 89 77  Resp: 18 18  Temp: 98.3 F (36.8 C) 98.2 F (36.8 C)  SpO2: 99% 99%    General: Awake, no distress.  CV:  Good peripheral perfusion.  Resp:  Normal effort.  Abd:  No distention.  MSK:  No deformity noted.  Right-sided paraspinal lumbar tenderness without overlying skin changes.  No midline tenderness. Neuro:  No focal deficits appreciated. Other:     ED Results / Procedures / Treatments   Labs (all labs ordered are listed, but only abnormal results are displayed) Labs Reviewed - No data to display  EKG   RADIOLOGY   Official radiology report(s): No results found.  PROCEDURES and INTERVENTIONS:  Procedures  Medications  ketorolac (TORADOL) 30 MG/ML injection 30 mg (30 mg Intramuscular Given  08/28/23 0204)  acetaminophen (TYLENOL) tablet 1,000 mg (1,000 mg Oral Given 08/28/23 0203)  methocarbamol (ROBAXIN) tablet 500 mg (500 mg Oral Given 08/28/23 0203)     IMPRESSION / MDM / ASSESSMENT AND PLAN / ED COURSE  I reviewed the triage vital signs and the nursing notes.  Differential diagnosis includes, but is not limited to, sciatica, lumbar fracture, cauda equina  Patient presents with acute on chronic sciatica symptoms suitable for outpatient management.  No red flag features.  No indications for imaging.  Provided nonnarcotic multimodal analgesia.      FINAL CLINICAL IMPRESSION(S) / ED DIAGNOSES   Final diagnoses:  Sciatica of right side     Rx / DC Orders   ED Discharge Orders          Ordered    methocarbamol (ROBAXIN) 500 MG tablet  Every 8 hours PRN        08/28/23 0155             Note:  This document was prepared using Dragon voice recognition software and  may include unintentional dictation errors.   Delton Prairie, MD 08/28/23 0730

## 2023-10-02 NOTE — Progress Notes (Addendum)
 Referring Physician:  Anson Oregon, PA-C [address] [place],  [place] [address]  Primary Physician:  Pcp, No  History of Present Illness: 10/05/2023 [name] is here today with a chief complaint of back pain x 7 months.  It is a sharp pain that radiates from her back down the back of her right leg into the foot.  She is unsure of a specific inciting event, but adds that she may have picked up something heavy the day before.  She is concerned because her weakness has become worse since this first occurred.  She feels as though often her right leg will give out on her and she notices that she has not been able to walk like normal.  She also has constant numbness and tingling in her foot and her ankle often "rolls out".  She is taking Tylenol primarily for pain.  She was previously prescribed prednisone and underwent an injection which lasted for 2 months.  She previously tried gabapentin, but it made her feel too sleepy.  She denies any saddle anesthesia or incontinence to bowel or bladder.  The symptoms are causing a significant impact on the patient's life.  Conservative measures:  Physical therapy: has not participated in  formal PT, been doing a home stretching Multimodal medical therapy including regular antiinflammatories: Tylenol, Methocarbamol, Flexeril, Meloxicam, Lidocaine patches, Oxycodone, Prednisone Injections: no epidural steroid injections- states she had an injection 6-7 months ago with 2 months of relief   Past Surgery: no Review of Systems:  A 10 point review of systems is negative, except for the pertinent positives and negatives detailed in the HPI.  Past Medical History: Past Medical History:  Diagnosis Date   Anxiety    Asthma    Diabetes mellitus without complication (HCC)    Headache(784.0)     Past Surgical History: Past Surgical History:  Procedure Laterality Date   NO PAST SURGERIES      Allergies: Allergies as of  10/05/2023 - Review Complete 10/05/2023  Allergen Reaction Noted   Latex Rash 06/05/2013    Medications: Outpatient Encounter Medications as of 10/05/2023  Medication Sig   acetaminophen (TYLENOL) 80 MG chewable tablet Chew 1,000 mg by mouth 2 (two) times daily. Patient reports taking 1,000mg  twice daily for pain   cyclobenzaprine (FLEXERIL) 10 MG tablet Take 1 whole tablet by mouth at night as needed. May also take 1/2 tablet every 8 to 12 hours as needed during the day for muscle spasms. (Patient not taking: Reported on 10/05/2023)   insulin NPH-regular Human (70-30) 100 UNIT/ML injection Inject into the skin 2 (two) times daily with a meal. (Patient not taking: Reported on 10/05/2023)   lidocaine (LIDODERM) 5 % Place 1 patch onto the skin every 12 (twelve) hours. Remove & Discard patch within 12 hours or as directed by MD (Patient not taking: Reported on 10/05/2023)   meloxicam (MOBIC) 15 MG tablet Take 1 tablet (15 mg total) by mouth daily. (Patient not taking: Reported on 10/05/2023)   methocarbamol (ROBAXIN) 500 MG tablet Take 1 tablet (500 mg total) by mouth 4 (four) times daily. (Patient not taking: Reported on 10/05/2023)   methocarbamol (ROBAXIN) 500 MG tablet Take 1 tablet (500 mg total) by mouth every 8 (eight) hours as needed. (Patient not taking: Reported on 10/05/2023)   oxyCODONE-acetaminophen (PERCOCET) 5-325 MG tablet Take 1 tablet by mouth every 4 (four) hours as needed for severe pain. (Patient not taking: Reported on 10/05/2023)   predniSONE (DELTASONE) 10  MG tablet Take 1 tablet (10 mg total) by mouth as directed. (Patient not taking: Reported on 10/05/2023)   No facility-administered encounter medications on file as of 10/05/2023.    Social History: Social History   Tobacco Use   Smoking status: Former    Current packs/day: 0.00    Average packs/day: 0.3 packs/day for 20.0 years (5.0 ttl pk-yrs)    Types: Cigarettes    Quit date: 04/2023    Years since quitting: 0.4    Smokeless tobacco: Never  Vaping Use   Vaping status: Never Used  Substance Use Topics   Alcohol use: No    Alcohol/week: 0.0 standard drinks of alcohol   Drug use: No    Family Medical History: Family History  Problem Relation Age of Onset   Heart disease Other        No family history    Physical Examination: @VITALWITHPAIN @  General: Patient is well developed, well nourished, calm, collected, and in no apparent distress. Attention to examination is appropriate.  Psychiatric: Patient is non-anxious.  Head:  Pupils equal, round, and reactive to light.  ENT:  Oral mucosa appears well hydrated.  Neck:   Supple.  Full range of motion.  Respiratory: Patient is breathing without any difficulty.  Extremities: No edema.  Vascular: Palpable dorsal pedal pulses.  Skin:   On exposed skin, there are no abnormal skin lesions.  NEUROLOGICAL:     Awake, alert, oriented to person, place, and time.  Speech is clear and fluent. Fund of knowledge is appropriate.   Cranial Nerves: Pupils equal round and reactive to light.  Facial tone is symmetric.  ROM of spine: Limited range of motion of her lumbar spine, the patient is nontender.   Strength:  Right lower extremity: Patient cannot support her weight and right calf, is not antigravity for plantarflexion-3.Marland Kitchen Dorsiflexion, eversion, EHL, right hamstring and a 4 -.  Patient with 5/5 strength in right hip flexion, quadriceps, hamstrings.  Left lower extremity: Full 5/5 strength throughout.  +SLR on the right lower extremity   Reflexes are 2+ in patellar, hyporeflexic in right Achilles. Clonus is not present.  Toes are down-going.  Bilateral  lower extremity sensation is intact to light touch, but decreased in right lower extremity compared to the left Steppage gait noted.   Medical Decision Making  Imaging: MRI lumbar spine 04/11/2023: IMPRESSION: Right paracentral disc protrusion at L5-S1 with severe narrowing of the  right lateral recess and mass effect on the descending S1 nerve root. Mild spinal canal narrowing at this level.   I have personally reviewed the images and agree with the above interpretation.  Assessment and Plan: [name] is a pleasant 43 y.o. female is here today with a chief complaint of back pain x 7 months.  It is a sharp pain that radiates from her back down the back of her right leg into the foot.  She is unsure of a specific inciting event, but adds that she may have picked up something heavy the day before.  She is concerned because her weakness has become worse since this first occurred.  She feels as though often her right leg will give out on her and she notices that she has not been able to walk like normal.  She also has constant numbness and tingling in her foot and her ankle often "rolls out".  She is taking Tylenol primarily for pain.  She was previously prescribed prednisone and underwent an injection which lasted for 2  months.  She previously tried gabapentin, but it made her feel too sleepy.  She denies any saddle anesthesia or incontinence to bowel or bladder.  The symptoms are causing a significant impact on the patient's life.  On examination,Right lower extremity: Patient cannot support her weight and right calf, is not antigravity for plantarflexion-3.Marland KitchenDorsiflexion, eversion, EHL, right hamstring and a 4 -.  Patient with 5/5 strength in right hip flexion, quadriceps, hamstrings.+SLR on the right lower extremity.  Hyperreflexic in right Achilles.  Decree sensation of right lower extremity.  Steppage gait noted.  It was a pleasure to see patient in clinic today.  Patient has worsening weakness in her right lower extremity compared to her previous exam with severe narrowing of the right lateral recess and mass effect on the descending S1 nerve root on her right side.  Due to this, it is recommended that patient undergo surgery to decompress this area.  All risks and benefits  related to surgery were discussed with her at length.  She does agree to proceed for right L5-S1 MIS laminectomy.   Thank you for involving me in the care of this patient.   Joan Flores, PA-C Dept. of Neurosurgery    Addendum I saw this patient with Joan Flores, PA-C.  I agree with her assessment and plan.  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  She is unable to lift her body weight with her right foot.  She has severe pain shooting down the back of her leg into the S1 dermatome.  She also has weakness in confrontational exam on plantarflexion inversion eversion and some dorsiflexion as well.  She also has hamstring weakness.  When she walks she appears to have a slight hip drop.  Given these findings and her deficit and lack of improvement with conservative management we feel she would benefit from a lumbar laminectomy/discectomy through a minimally invasive approach from the right at L5-S1 for decompression of the nerve root and resection of the herniated disc.

## 2023-10-02 NOTE — H&P (View-Only) (Signed)
Referring Physician:  Anson Oregon, PA-C [address] [place],  [place] [address]  Primary Physician:  Pcp, No  History of Present Illness: 10/05/2023 [name] is here today with a chief complaint of back pain x 7 months.  It is a sharp pain that radiates from her back down the back of her right leg into the foot.  She is unsure of a specific inciting event, but adds that she may have picked up something heavy the day before.  She is concerned because her weakness has become worse since this first occurred.  She feels as though often her right leg will give out on her and she notices that she has not been able to walk like normal.  She also has constant numbness and tingling in her foot and her ankle often "rolls out".  She is taking Tylenol primarily for pain.  She was previously prescribed prednisone and underwent an injection which lasted for 2 months.  She previously tried gabapentin, but it made her feel too sleepy.  She denies any saddle anesthesia or incontinence to bowel or bladder.  The symptoms are causing a significant impact on the patient's life.  Conservative measures:  Physical therapy: has not participated in  formal PT, been doing a home stretching Multimodal medical therapy including regular antiinflammatories: Tylenol, Methocarbamol, Flexeril, Meloxicam, Lidocaine patches, Oxycodone, Prednisone Injections: no epidural steroid injections- states she had an injection 6-7 months ago with 2 months of relief   Past Surgery: no Review of Systems:  A 10 point review of systems is negative, except for the pertinent positives and negatives detailed in the HPI.  Past Medical History: Past Medical History:  Diagnosis Date   Anxiety    Asthma    Diabetes mellitus without complication (HCC)    Headache(784.0)     Past Surgical History: Past Surgical History:  Procedure Laterality Date   NO PAST SURGERIES      Allergies: Allergies as of  10/05/2023 - Review Complete 10/05/2023  Allergen Reaction Noted   Latex Rash 06/05/2013    Medications: Outpatient Encounter Medications as of 10/05/2023  Medication Sig   acetaminophen (TYLENOL) 80 MG chewable tablet Chew 1,000 mg by mouth 2 (two) times daily. Patient reports taking 1,000mg  twice daily for pain   cyclobenzaprine (FLEXERIL) 10 MG tablet Take 1 whole tablet by mouth at night as needed. May also take 1/2 tablet every 8 to 12 hours as needed during the day for muscle spasms. (Patient not taking: Reported on 10/05/2023)   insulin NPH-regular Human (70-30) 100 UNIT/ML injection Inject into the skin 2 (two) times daily with a meal. (Patient not taking: Reported on 10/05/2023)   lidocaine (LIDODERM) 5 % Place 1 patch onto the skin every 12 (twelve) hours. Remove & Discard patch within 12 hours or as directed by MD (Patient not taking: Reported on 10/05/2023)   meloxicam (MOBIC) 15 MG tablet Take 1 tablet (15 mg total) by mouth daily. (Patient not taking: Reported on 10/05/2023)   methocarbamol (ROBAXIN) 500 MG tablet Take 1 tablet (500 mg total) by mouth 4 (four) times daily. (Patient not taking: Reported on 10/05/2023)   methocarbamol (ROBAXIN) 500 MG tablet Take 1 tablet (500 mg total) by mouth every 8 (eight) hours as needed. (Patient not taking: Reported on 10/05/2023)   oxyCODONE-acetaminophen (PERCOCET) 5-325 MG tablet Take 1 tablet by mouth every 4 (four) hours as needed for severe pain. (Patient not taking: Reported on 10/05/2023)   predniSONE (DELTASONE) 10  MG tablet Take 1 tablet (10 mg total) by mouth as directed. (Patient not taking: Reported on 10/05/2023)   No facility-administered encounter medications on file as of 10/05/2023.    Social History: Social History   Tobacco Use   Smoking status: Former    Current packs/day: 0.00    Average packs/day: 0.3 packs/day for 20.0 years (5.0 ttl pk-yrs)    Types: Cigarettes    Quit date: 04/2023    Years since quitting: 0.4    Smokeless tobacco: Never  Vaping Use   Vaping status: Never Used  Substance Use Topics   Alcohol use: No    Alcohol/week: 0.0 standard drinks of alcohol   Drug use: No    Family Medical History: Family History  Problem Relation Age of Onset   Heart disease Other        No family history    Physical Examination: @VITALWITHPAIN @  General: Patient is well developed, well nourished, calm, collected, and in no apparent distress. Attention to examination is appropriate.  Psychiatric: Patient is non-anxious.  Head:  Pupils equal, round, and reactive to light.  ENT:  Oral mucosa appears well hydrated.  Neck:   Supple.  Full range of motion.  Respiratory: Patient is breathing without any difficulty.  Extremities: No edema.  Vascular: Palpable dorsal pedal pulses.  Skin:   On exposed skin, there are no abnormal skin lesions.  NEUROLOGICAL:     Awake, alert, oriented to person, place, and time.  Speech is clear and fluent. Fund of knowledge is appropriate.   Cranial Nerves: Pupils equal round and reactive to light.  Facial tone is symmetric.  ROM of spine: Limited range of motion of her lumbar spine, the patient is nontender.   Strength:  Right lower extremity: Patient cannot support her weight and right calf, is not antigravity for plantarflexion-3.Marland Kitchen Dorsiflexion, eversion, EHL, right hamstring and a 4 -.  Patient with 5/5 strength in right hip flexion, quadriceps, hamstrings.  Left lower extremity: Full 5/5 strength throughout.  +SLR on the right lower extremity   Reflexes are 2+ in patellar, hyporeflexic in right Achilles. Clonus is not present.  Toes are down-going.  Bilateral  lower extremity sensation is intact to light touch, but decreased in right lower extremity compared to the left Steppage gait noted.   Medical Decision Making  Imaging: MRI lumbar spine 04/11/2023: IMPRESSION: Right paracentral disc protrusion at L5-S1 with severe narrowing of the  right lateral recess and mass effect on the descending S1 nerve root. Mild spinal canal narrowing at this level.   I have personally reviewed the images and agree with the above interpretation.  Assessment and Plan: [name] is a pleasant 43 y.o. female is here today with a chief complaint of back pain x 7 months.  It is a sharp pain that radiates from her back down the back of her right leg into the foot.  She is unsure of a specific inciting event, but adds that she may have picked up something heavy the day before.  She is concerned because her weakness has become worse since this first occurred.  She feels as though often her right leg will give out on her and she notices that she has not been able to walk like normal.  She also has constant numbness and tingling in her foot and her ankle often "rolls out".  She is taking Tylenol primarily for pain.  She was previously prescribed prednisone and underwent an injection which lasted for 2  months.  She previously tried gabapentin, but it made her feel too sleepy.  She denies any saddle anesthesia or incontinence to bowel or bladder.  The symptoms are causing a significant impact on the patient's life.  On examination,Right lower extremity: Patient cannot support her weight and right calf, is not antigravity for plantarflexion-3.Marland KitchenDorsiflexion, eversion, EHL, right hamstring and a 4 -.  Patient with 5/5 strength in right hip flexion, quadriceps, hamstrings.+SLR on the right lower extremity.  Hyperreflexic in right Achilles.  Decree sensation of right lower extremity.  Steppage gait noted.  It was a pleasure to see patient in clinic today.  Patient has worsening weakness in her right lower extremity compared to her previous exam with severe narrowing of the right lateral recess and mass effect on the descending S1 nerve root on her right side.  Due to this, it is recommended that patient undergo surgery to decompress this area.  All risks and benefits  related to surgery were discussed with her at length.  She does agree to proceed for right L5-S1 MIS laminectomy.   Thank you for involving me in the care of this patient.   Joan Flores, PA-C Dept. of Neurosurgery    Addendum I saw this patient with Joan Flores, PA-C.  I agree with her assessment and plan.  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  She is unable to lift her body weight with her right foot.  She has severe pain shooting down the back of her leg into the S1 dermatome.  She also has weakness in confrontational exam on plantarflexion inversion eversion and some dorsiflexion as well.  She also has hamstring weakness.  When she walks she appears to have a slight hip drop.  Given these findings and her deficit and lack of improvement with conservative management we feel she would benefit from a lumbar laminectomy/discectomy through a minimally invasive approach from the right at L5-S1 for decompression of the nerve root and resection of the herniated disc.

## 2023-10-05 ENCOUNTER — Other Ambulatory Visit: Payer: Self-pay

## 2023-10-05 ENCOUNTER — Ambulatory Visit (INDEPENDENT_AMBULATORY_CARE_PROVIDER_SITE_OTHER): Payer: Commercial Managed Care - PPO | Admitting: Physician Assistant

## 2023-10-05 ENCOUNTER — Encounter: Payer: Self-pay | Admitting: Physician Assistant

## 2023-10-05 VITALS — BP 118/72 | Wt 116.6 lb

## 2023-10-05 DIAGNOSIS — R29898 Other symptoms and signs involving the musculoskeletal system: Secondary | ICD-10-CM

## 2023-10-05 DIAGNOSIS — M5117 Intervertebral disc disorders with radiculopathy, lumbosacral region: Secondary | ICD-10-CM | POA: Diagnosis not present

## 2023-10-05 DIAGNOSIS — Z01818 Encounter for other preprocedural examination: Secondary | ICD-10-CM

## 2023-10-05 DIAGNOSIS — M48061 Spinal stenosis, lumbar region without neurogenic claudication: Secondary | ICD-10-CM

## 2023-10-05 NOTE — Patient Instructions (Addendum)
  Please see below for information in regards to your upcoming surgery:   Planned surgery: Right L5-S1 MIS (minimally invasive) laminectomy and discectomy   Surgery date: 10/22/23 at [facility] (Medical Mall: [address] [place], [place] [address]) - you will find out your arrival time the business day before your surgery.   Pre-op appointment at [facility] Pre-admit Testing: we will call you with a date/time for this. If you are scheduled for an in person appointment, Pre-admit Testing is located on the first floor of the [facility] building, [address] [facility], [address]. Please bring all prescriptions in the original prescription bottles to your appointment. During this appointment, they will advise you which medications you can take the morning of surgery, and which medications you will need to hold for surgery. Labs (such as blood work, EKG) may be done at your pre-op appointment. You are not required to fast for these labs. Should you need to change your pre-op appointment, please call Pre-admit testing at [phone].      Common restrictions after surgery: No bending, lifting, or twisting ("BLT"). Avoid lifting objects heavier than 10 pounds for the first 6 weeks after surgery. Where possible, avoid household activities that involve lifting, bending, reaching, pushing, or pulling such as laundry, vacuuming, grocery shopping, and childcare. Try to arrange for help from friends and family for these activities while you heal. Do not drive while taking prescription pain medication. Weeks 6 through 12 after surgery: avoid lifting more than 25 pounds.      How to contact us :  If you have any questions/concerns before or after surgery, you can reach us  at [phone], or you can send a mychart message. We can be reached by phone or mychart 8am-4pm, Monday-Friday.  *Please note: Calls after 4pm are forwarded to a third party answering service. Mychart  messages are not routinely monitored during evenings, weekends, and holidays. Please call our office to contact the answering service for urgent concerns during non-business hours.     If you have FMLA/disability paperwork, please drop it off or fax it to [phone], attention Patty.   Appointments/FMLA & disability paperwork: Odetta Mora, & Ritta Registered Nurse/Surgery scheduler: [facility] Medical Assistants: Damien ODESSIA Sailors Physician Assistants: Lyle Decamp, PA-C, Edsel Goods, PA-C & Glade Boys, PA-C Surgeons: Reeves Daisy, MD & Penne Sharps, MD

## 2023-10-12 ENCOUNTER — Encounter
Admission: RE | Admit: 2023-10-12 | Discharge: 2023-10-12 | Disposition: A | Discharge status: Home / Self Care | Payer: Commercial Managed Care - PPO | Source: Ambulatory Visit | Attending: Neurosurgery | Admitting: Neurosurgery

## 2023-10-12 ENCOUNTER — Other Ambulatory Visit: Payer: Self-pay

## 2023-10-12 VITALS — BP 124/71 | HR 74 | Temp 98.3°F | Resp 18 | Ht 64.0 in | Wt 118.5 lb

## 2023-10-12 DIAGNOSIS — R7989 Other specified abnormal findings of blood chemistry: Secondary | ICD-10-CM | POA: Diagnosis not present

## 2023-10-12 DIAGNOSIS — E1010 Type 1 diabetes mellitus with ketoacidosis without coma: Secondary | ICD-10-CM | POA: Diagnosis not present

## 2023-10-12 DIAGNOSIS — Z01812 Encounter for preprocedural laboratory examination: Secondary | ICD-10-CM | POA: Diagnosis present

## 2023-10-12 DIAGNOSIS — Z01818 Encounter for other preprocedural examination: Secondary | ICD-10-CM | POA: Insufficient documentation

## 2023-10-12 DIAGNOSIS — R000 Tachycardia, unspecified: Secondary | ICD-10-CM | POA: Insufficient documentation

## 2023-10-12 DIAGNOSIS — Z01810 Encounter for preprocedural cardiovascular examination: Secondary | ICD-10-CM | POA: Diagnosis not present

## 2023-10-12 HISTORY — DX: Other acidosis: E87.29

## 2023-10-12 HISTORY — DX: Hyperkalemia: E87.5

## 2023-10-12 HISTORY — DX: Acute myocardial infarction, unspecified: I21.9

## 2023-10-12 HISTORY — DX: Migraine, unspecified, not intractable, without status migrainosus: G43.909

## 2023-10-12 HISTORY — DX: Other diseases of tongue: K14.8

## 2023-10-12 HISTORY — DX: Depression, unspecified: F32.A

## 2023-10-12 HISTORY — DX: Tachycardia, unspecified: R00.0

## 2023-10-12 HISTORY — DX: Moderate protein-calorie malnutrition: E44.0

## 2023-10-12 HISTORY — DX: Acute kidney failure, unspecified: N17.9

## 2023-10-12 HISTORY — DX: Acute respiratory failure with hypoxia: J96.01

## 2023-10-12 HISTORY — DX: Insomnia, unspecified: G47.00

## 2023-10-12 HISTORY — DX: Gastro-esophageal reflux disease without esophagitis: K21.9

## 2023-10-12 HISTORY — DX: Hypotension, unspecified: I95.9

## 2023-10-12 HISTORY — DX: Dehydration: E86.0

## 2023-10-12 HISTORY — DX: Type 1 diabetes mellitus with ketoacidosis without coma: E10.10

## 2023-10-12 LAB — URINALYSIS, COMPLETE (UACMP) WITH MICROSCOPIC
Bacteria, UA: NONE SEEN
Bilirubin Urine: NEGATIVE
Glucose, UA: 50 mg/dL — AB
Hgb urine dipstick: NEGATIVE
Ketones, ur: NEGATIVE mg/dL
Leukocytes,Ua: NEGATIVE
Nitrite: NEGATIVE
Protein, ur: NEGATIVE mg/dL
Specific Gravity, Urine: 1.028 (ref 1.005–1.030)
pH: 5 (ref 5.0–8.0)

## 2023-10-12 LAB — HEMOGLOBIN A1C
Hgb A1c MFr Bld: 9.6 % — ABNORMAL HIGH (ref 4.8–5.6)
Mean Plasma Glucose: 228.82 mg/dL

## 2023-10-12 LAB — BASIC METABOLIC PANEL
Anion gap: 7 (ref 5–15)
BUN: 33 mg/dL — ABNORMAL HIGH (ref 6–20)
CO2: 28 mmol/L (ref 22–32)
Calcium: 8.7 mg/dL — ABNORMAL LOW (ref 8.9–10.3)
Chloride: 104 mmol/L (ref 98–111)
Creatinine, Ser: 0.75 mg/dL (ref 0.44–1.00)
GFR, Estimated: >60 mL/min (ref >60)
Glucose, Bld: 77 mg/dL (ref 70–99)
Potassium: 4.6 mmol/L (ref 3.5–5.1)
Sodium: 139 mmol/L (ref 135–145)

## 2023-10-12 LAB — CBC
HCT: 37.2 % (ref 36.0–46.0)
Hemoglobin: 12.2 g/dL (ref 12.0–15.0)
MCH: 31.5 pg (ref 26.0–34.0)
MCHC: 32.8 g/dL (ref 30.0–36.0)
MCV: 96.1 fL (ref 80.0–100.0)
Platelets: 210 10*3/uL (ref 150–400)
RBC: 3.87 MIL/uL (ref 3.87–5.11)
RDW: 13.5 % (ref 11.5–15.5)
WBC: 5.5 10*3/uL (ref 4.0–10.5)
nRBC: 0 % (ref 0.0–0.2)

## 2023-10-12 LAB — TYPE AND SCREEN
ABO/RH(D): A POS
Antibody Screen: NEGATIVE

## 2023-10-12 LAB — SURGICAL PCR SCREEN
MRSA, PCR: NEGATIVE
Staphylococcus aureus: NEGATIVE

## 2023-10-12 NOTE — Patient Instructions (Addendum)
Your procedure is scheduled on:   Thursday January 30  Report to the Registration Desk on the 1st floor of the [facility] To find out your arrival time, please call [phone] between 1PM - 3PM on:   Wednesday January 29  If your arrival time is 6:00 am, do not arrive before that time as the Medical Mall entrance doors do not open until 6:00 am.  REMEMBER: Instructions that are not followed completely may result in serious medical risk, up to and including death; or upon the discretion of your surgeon and anesthesiologist your surgery may need to be rescheduled.  Do not eat food after midnight the night before surgery.  No gum chewing or hard candies.  You may however, drink WATER up to 2 hours before you are scheduled to arrive for your surgery. Do not drink anything within 2 hours of your scheduled arrival time.  One week prior to surgery:  Starting Thursday January 23  Stop Anti-inflammatories (NSAIDS) such as Advil, Aleve, Ibuprofen, Motrin, Naproxen, Naprosyn and Aspirin based products such as Excedrin, Goody's Powder, BC Powder. Stop ANY OVER THE COUNTER supplements until after surgery. Collagen-Vitamin C-Biotin  Cyanocobalamin (VITAMIN B12 PO)   You may however, continue to take Tylenol if needed for pain up until the day of surgery.  **Follow guidelines for insulin and diabetes medications.** insulin NPH-regular Human (70-30) take regular dose the morning PRIOR to surgery but PM dose take (70 %) 8 units. THE MORNING OF SURGERY TAKE NO INSULIN  Continue taking all of your other prescription medications up until the day of surgery.  ON THE DAY OF SURGERY DO NOT TAKE ANY MEDICATIONS.  No Alcohol for 24 hours before or after surgery.  No Smoking including e-cigarettes for 24 hours before surgery.  No chewable tobacco products for at least 6 hours before surgery.  No nicotine patches on the day of surgery.  Do not use any "recreational" drugs for at least a week  (preferably 2 weeks) before your surgery.  Please be advised that the combination of cocaine and anesthesia may have negative outcomes, up to and including death. If you test positive for cocaine, your surgery will be cancelled.  On the morning of surgery brush your teeth with toothpaste and water, you may rinse your mouth with mouthwash if you wish. Do not swallow any toothpaste or mouthwash.  Use CHG Soap as directed on instruction sheet.  Do not wear jewelry, make-up, hairpins, clips or nail polish.  For welded (permanent) jewelry: bracelets, anklets, waist bands, etc.  Please have this removed prior to surgery.  If it is not removed, there is a chance that hospital personnel will need to cut it off on the day of surgery.  Do not wear lotions, powders, or perfumes.   Do not shave body hair from the neck down 48 hours before surgery.  Contact lenses, hearing aids and dentures may not be worn into surgery.  Do not bring valuables to the hospital. [facility] is not responsible for any missing/lost belongings or valuables.   Notify your doctor if there is any change in your medical condition (cold, fever, infection).  Wear comfortable clothing (specific to your surgery type) to the hospital.  After surgery, you can help prevent lung complications by doing breathing exercises.  Take deep breaths and cough every 1-2 hours.   If you are being discharged the day of surgery, you will not be allowed to drive home. You will need a responsible individual to drive  you home and stay with you for 24 hours after surgery.   If you are taking public transportation, you will need to have a responsible individual with you.  Please call the Pre-admissions Testing Dept. at [phone] if you have any questions about these instructions.  Surgery Visitation Policy:  Patients having surgery or a procedure may have two visitors.  Children under the age of 30 must have an adult with them who is not  the patient.  Temporary Visitor Restrictions Due to increasing cases of flu, RSV and COVID-19: Children ages 65 and under will not be able to visit patients in [facility] hospitals under most circumstances.           Pre-operative 5 CHG Bath Instructions   You can play a key role in reducing the risk of infection after surgery. Your skin needs to be as free of germs as possible. You can reduce the number of germs on your skin by washing with CHG (chlorhexidine gluconate) soap before surgery. CHG is an antiseptic soap that kills germs and continues to kill germs even after washing.   DO NOT use if you have an allergy to chlorhexidine/CHG or antibacterial soaps. If your skin becomes reddened or irritated, stop using the CHG and notify one of our RNs at [phone].   Please shower with the CHG soap starting 4 days before surgery using the following schedule:     Please keep in mind the following:  DO NOT shave, including legs and underarms, starting the day of your first shower.   You may shave your face at any point before/day of surgery.  Place clean sheets on your bed the day you start using CHG soap. Use a clean washcloth (not used since being washed) for each shower. DO NOT sleep with pets once you start using the CHG.   CHG Shower Instructions:  If you choose to wash your hair and private area, wash first with your normal shampoo/soap.  After you use shampoo/soap, rinse your hair and body thoroughly to remove shampoo/soap residue.  Turn the water OFF and apply about 3 tablespoons (45 ml) of CHG soap to a CLEAN washcloth.  Apply CHG soap ONLY FROM YOUR NECK DOWN TO YOUR TOES (washing for 3-5 minutes)  DO NOT use CHG soap on face, private areas, open wounds, or sores.  Pay special attention to the area where your surgery is being performed.  If you are having back surgery, having someone wash your back for you may be helpful. Wait 2 minutes after CHG soap is applied, then  you may rinse off the CHG soap.  Pat dry with a clean towel  Put on clean clothes/pajamas   If you choose to wear lotion, please use ONLY the CHG-compatible lotions on the back of this paper.     Additional instructions for the day of surgery: DO NOT APPLY any lotions, deodorants, cologne, or perfumes.   Put on clean/comfortable clothes.  Brush your teeth.  Ask your nurse before applying any prescription medications to the skin.      CHG Compatible Lotions   Aveeno Moisturizing lotion  Cetaphil Moisturizing Cream  Cetaphil Moisturizing Lotion  Clairol Herbal Essence Moisturizing Lotion, Dry Skin  Clairol Herbal Essence Moisturizing Lotion, Extra Dry Skin  Clairol Herbal Essence Moisturizing Lotion, Normal Skin  Curel Age Defying Therapeutic Moisturizing Lotion with Alpha Hydroxy  Curel Extreme Care Body Lotion  Curel Soothing Hands Moisturizing Hand Lotion  Curel Therapeutic Moisturizing Cream, Fragrance-Free  Curel  Therapeutic Moisturizing Lotion, Fragrance-Free  Curel Therapeutic Moisturizing Lotion, Original Formula  Eucerin Daily Replenishing Lotion  Eucerin Dry Skin Therapy Plus Alpha Hydroxy Crme  Eucerin Dry Skin Therapy Plus Alpha Hydroxy Lotion  Eucerin Original Crme  Eucerin Original Lotion  Eucerin Plus Crme Eucerin Plus Lotion  Eucerin TriLipid Replenishing Lotion  Keri Anti-Bacterial Hand Lotion  Keri Deep Conditioning Original Lotion Dry Skin Formula Softly Scented  Keri Deep Conditioning Original Lotion, Fragrance Free Sensitive Skin Formula  Keri Lotion Fast Absorbing Fragrance Free Sensitive Skin Formula  Keri Lotion Fast Absorbing Softly Scented Dry Skin Formula  Keri Original Lotion  Keri Skin Renewal Lotion Keri Silky Smooth Lotion  Keri Silky Smooth Sensitive Skin Lotion  Nivea Body Creamy Conditioning Oil  Nivea Body Extra Enriched Teacher, adult education Moisturizing Lotion Nivea Crme  Nivea Skin Firming Lotion   NutraDerm 30 Skin Lotion  NutraDerm Skin Lotion  NutraDerm Therapeutic Skin Cream  NutraDerm Therapeutic Skin Lotion  ProShield Protective Hand Cream  Provon moisturizing lotion

## 2023-10-12 NOTE — Pre-Procedure Instructions (Signed)
When patient was interviewed it was noted by this nurse the patient has no endocrinologist or cardiologist. The insulin that is being administered is a self medication technique created by the patient.  Nurse Practitioner Quentin Mulling made aware and ordered an A1C to be drawn from the patient. Awaiting results.

## 2023-10-13 ENCOUNTER — Encounter: Payer: Self-pay | Admitting: Neurosurgery

## 2023-10-13 NOTE — Progress Notes (Signed)
[facility] Perioperative Services: Pre-Admission/Anesthesia Testing  Abnormal Lab Notification   Date: 10/13/23  Name: Kristine Allison MRN:   [id number]  Re: Abnormal labs noted during PAT appointment   Notified:    Provider Name Provider Role Notification Mode  Lovenia Kim, MD Neurosurgery Routed and/or faxed via [facility]   ABNORMAL LAB VALUE(S):   Lab Results  Component Value Date   HGBA1C 9.6 (H) 10/12/2023   Clinical Information and Notes:  Kristine Allison is scheduled for a RIGHT L5-S1 MINIMALLY INVASIVE (MIS) LAMINECTOMY AND DISCECTOMY (Right) on 10/22/2023.   Patient diagnosed with type 1 vs type 1/2 diabetes in 2013. Since the time of diagnosis, patient has had multiple admissions for severe hyperglycemia and resulting diabetic ketoacidosis (DKA). Patient was admitted for NSTEMI in 2015 in the setting of severe hyperglycemia.There were (+) ECG changes (TWIs in I and aVL). Peak troponin was 0.60 ng/mL. Reports of chest pain were resolved with GI cocktail administration. With enzyme elevation and ECG changes, cardiology was consulted. NSTEMI felt to be unlikely and was thus ruled out. With improvement of symptoms with administered intervention, chest pain deemed to be of GI etiology. History updated  in [facility] today to reflect this information and avoid confusion going forward with regards to patient's PMH.   She advised during her preoperative visit with [place] in PAT that she has no endocrinologist. Of added concern, she is not followed even followed by medicine for primary care.   Available HgbA1c data trended since time of diagnosis in 2013. Patient has been in the 9-14% range, with the maximum being 14.6% (2013).   As an outpatient, it appears as if patient has been on oral (metformin) and multiple shorter acting and basal parenteral therapies in the past. Due to financial concerns and lack of medical insurance, as some point, patient elected to  self discontinue anti-diabetic therapies. Of note, patient was seen by both diabetes educators and care management during past admissions. She was referred to [facility] and [facility] clinics. Most recent note in [facility] dated 02/07/2018 from Dr. Janit Pagan, MD indicated that they reached out to patient via certified letter and phone call in efforts to schedule visit to re-establish care (had not been to clinic in over 3 years) and to follow up following recent hospital admission. Note indicated that patient would be removed from patient panel if she did not schedule follow up. I assume the latter was the outcome, as there is no further documentation from family/internal medicine.    Patient reported to PAT staff that she was "feeling bad" about a year ago and decided to start self treating her diabetes. She purchased OTC insulin from [place] and a glucometer from [employer]. After "playing around" with the dosages, patient reporting that she landed an average need of 12 units of subcutaneous insulin twice a day.   Patient is undoubtedly medically non-compliant, which I have added to her PMH list in [facility]. Sending information to primary attending surgeon to make him aware. Unsure of the urgency of her procedure at this point, however need to determine surgeon's comfort level with proceeding with case in patient with uncontrolled T1DM. Again, patient has no primary care providers involved in her medical care, which is concerning given the added physiological stress that will stands to be associated with patient's upcoming procedure with neurosurgery. This places patient at risk for further uncontrolled glucose levels that potentially could result in recurrent episodes of DKA,  infection, delayed healing, and other potential perioperative complications.   Quentin Mulling, MSN, APRN, FNP-C, CEN [facility]  Perioperative Services Nurse Practitioner Phone: [phone] Fax:  [phone] 10/13/23 9:04 AM

## 2023-10-21 MED ORDER — SODIUM CHLORIDE 0.9 % IV SOLN: INTRAVENOUS | Status: DC

## 2023-10-21 MED ORDER — CEFAZOLIN SODIUM-DEXTROSE 2-4 GM/100ML-% IV SOLN
2.0000 g | INTRAVENOUS | Status: AC
  Administered 2023-10-22: 2 g via INTRAVENOUS

## 2023-10-21 MED ORDER — CEFAZOLIN IN SODIUM CHLORIDE 2-0.9 GM/100ML-% IV SOLN
2.0000 g | Freq: Once | INTRAVENOUS | Status: DC
  Filled 2023-10-21: qty 100

## 2023-10-21 MED ORDER — CHLORHEXIDINE GLUCONATE 0.12 % MT SOLN
15.0000 mL | Freq: Once | OROMUCOSAL | Status: AC
  Administered 2023-10-22: 15 mL via OROMUCOSAL

## 2023-10-21 MED ORDER — ORAL CARE MOUTH RINSE: 15.0000 mL | Freq: Once | OROMUCOSAL | Status: AC

## 2023-10-22 ENCOUNTER — Other Ambulatory Visit: Payer: Self-pay

## 2023-10-22 ENCOUNTER — Ambulatory Visit: Payer: Commercial Managed Care - PPO | Admitting: Urgent Care

## 2023-10-22 ENCOUNTER — Ambulatory Visit: Payer: Commercial Managed Care - PPO

## 2023-10-22 ENCOUNTER — Encounter
Admission: RE | Disposition: A | Discharge status: Home / Self Care | Payer: Self-pay | Source: Home / Self Care | Attending: Neurosurgery

## 2023-10-22 ENCOUNTER — Ambulatory Visit
Admission: RE | Admit: 2023-10-22 | Discharge: 2023-10-22 | Disposition: A | Discharge status: Home / Self Care | Payer: Commercial Managed Care - PPO | Attending: Neurosurgery | Admitting: Neurosurgery

## 2023-10-22 ENCOUNTER — Encounter: Payer: Self-pay | Admitting: Neurosurgery

## 2023-10-22 DIAGNOSIS — Z791 Long term (current) use of non-steroidal anti-inflammatories (NSAID): Secondary | ICD-10-CM | POA: Diagnosis not present

## 2023-10-22 DIAGNOSIS — Z01812 Encounter for preprocedural laboratory examination: Secondary | ICD-10-CM

## 2023-10-22 DIAGNOSIS — K219 Gastro-esophageal reflux disease without esophagitis: Secondary | ICD-10-CM | POA: Diagnosis not present

## 2023-10-22 DIAGNOSIS — M5126 Other intervertebral disc displacement, lumbar region: Secondary | ICD-10-CM | POA: Diagnosis not present

## 2023-10-22 DIAGNOSIS — M5117 Intervertebral disc disorders with radiculopathy, lumbosacral region: Secondary | ICD-10-CM | POA: Diagnosis not present

## 2023-10-22 DIAGNOSIS — J45909 Unspecified asthma, uncomplicated: Secondary | ICD-10-CM | POA: Diagnosis not present

## 2023-10-22 DIAGNOSIS — R519 Headache, unspecified: Secondary | ICD-10-CM | POA: Diagnosis not present

## 2023-10-22 DIAGNOSIS — I252 Old myocardial infarction: Secondary | ICD-10-CM | POA: Diagnosis not present

## 2023-10-22 DIAGNOSIS — Z7952 Long term (current) use of systemic steroids: Secondary | ICD-10-CM | POA: Diagnosis not present

## 2023-10-22 DIAGNOSIS — R29898 Other symptoms and signs involving the musculoskeletal system: Secondary | ICD-10-CM

## 2023-10-22 DIAGNOSIS — Z79899 Other long term (current) drug therapy: Secondary | ICD-10-CM | POA: Diagnosis not present

## 2023-10-22 DIAGNOSIS — M5416 Radiculopathy, lumbar region: Secondary | ICD-10-CM | POA: Diagnosis not present

## 2023-10-22 DIAGNOSIS — E109 Type 1 diabetes mellitus without complications: Secondary | ICD-10-CM | POA: Insufficient documentation

## 2023-10-22 DIAGNOSIS — M79606 Pain in leg, unspecified: Secondary | ICD-10-CM | POA: Diagnosis present

## 2023-10-22 DIAGNOSIS — Z01818 Encounter for other preprocedural examination: Secondary | ICD-10-CM

## 2023-10-22 DIAGNOSIS — M48061 Spinal stenosis, lumbar region without neurogenic claudication: Secondary | ICD-10-CM | POA: Insufficient documentation

## 2023-10-22 DIAGNOSIS — Z794 Long term (current) use of insulin: Secondary | ICD-10-CM | POA: Diagnosis not present

## 2023-10-22 DIAGNOSIS — Z87891 Personal history of nicotine dependence: Secondary | ICD-10-CM | POA: Diagnosis not present

## 2023-10-22 DIAGNOSIS — E1010 Type 1 diabetes mellitus with ketoacidosis without coma: Secondary | ICD-10-CM

## 2023-10-22 HISTORY — DX: Patient's noncompliance with other medical treatment and regimen due to unspecified reason: Z91.199

## 2023-10-22 HISTORY — PX: LUMBAR LAMINECTOMY/DECOMPRESSION MICRODISCECTOMY: SHX5026

## 2023-10-22 LAB — ABO/RH: ABO/RH(D): A POS

## 2023-10-22 LAB — GLUCOSE, CAPILLARY
Glucose-Capillary: 151 mg/dL — ABNORMAL HIGH (ref 70–99)
Glucose-Capillary: 218 mg/dL — ABNORMAL HIGH (ref 70–99)

## 2023-10-22 SURGERY — LUMBAR LAMINECTOMY/DECOMPRESSION MICRODISCECTOMY 1 LEVEL
Anesthesia: General | Site: Back | Laterality: Right

## 2023-10-22 MED ORDER — HYDROMORPHONE HCL 1 MG/ML IJ SOLN
0.2500 mg | INTRAMUSCULAR | Status: DC | PRN
  Administered 2023-10-22 (×4): 0.5 mg via INTRAVENOUS

## 2023-10-22 MED ORDER — OXYCODONE HCL 5 MG/5ML PO SOLN: 5.0000 mg | Freq: Once | ORAL | Status: AC | PRN

## 2023-10-22 MED ORDER — CEFAZOLIN SODIUM-DEXTROSE 2-4 GM/100ML-% IV SOLN
INTRAVENOUS | Status: AC
  Filled 2023-10-22: qty 100

## 2023-10-22 MED ORDER — SUGAMMADEX SODIUM 200 MG/2ML IV SOLN
INTRAVENOUS | Status: DC | PRN
  Administered 2023-10-22: 107 mg via INTRAVENOUS

## 2023-10-22 MED ORDER — MIDAZOLAM HCL 2 MG/2ML IJ SOLN
INTRAMUSCULAR | Status: DC | PRN
  Administered 2023-10-22: 2 mg via INTRAVENOUS

## 2023-10-22 MED ORDER — HYDROMORPHONE HCL 1 MG/ML IJ SOLN
INTRAMUSCULAR | Status: AC
  Filled 2023-10-22: qty 1

## 2023-10-22 MED ORDER — DEXAMETHASONE SODIUM PHOSPHATE 10 MG/ML IJ SOLN
INTRAMUSCULAR | Status: DC | PRN
  Administered 2023-10-22: 10 mg via INTRAVENOUS

## 2023-10-22 MED ORDER — CEPHALEXIN 500 MG PO CAPS: 500.0000 mg | ORAL_CAPSULE | Freq: Four times a day (QID) | ORAL | 0 refills | Status: DC

## 2023-10-22 MED ORDER — EPHEDRINE 5 MG/ML INJ
INTRAVENOUS | Status: AC
  Filled 2023-10-22: qty 5

## 2023-10-22 MED ORDER — MIDAZOLAM HCL 2 MG/2ML IJ SOLN
INTRAMUSCULAR | Status: AC
  Filled 2023-10-22: qty 2

## 2023-10-22 MED ORDER — CHLORHEXIDINE GLUCONATE 0.12 % MT SOLN
OROMUCOSAL | Status: AC
  Filled 2023-10-22: qty 15

## 2023-10-22 MED ORDER — SODIUM CHLORIDE (PF) 0.9 % IJ SOLN
INTRAMUSCULAR | Status: DC | PRN
  Administered 2023-10-22: 30 mL

## 2023-10-22 MED ORDER — PHENYLEPHRINE 80 MCG/ML (10ML) SYRINGE FOR IV PUSH (FOR BLOOD PRESSURE SUPPORT)
PREFILLED_SYRINGE | INTRAVENOUS | Status: DC | PRN
  Administered 2023-10-22 (×2): 80 ug via INTRAVENOUS

## 2023-10-22 MED ORDER — SENNA 8.6 MG PO TABS: 1.0000 | ORAL_TABLET | Freq: Every day | ORAL | 0 refills | Status: AC

## 2023-10-22 MED ORDER — OXYCODONE HCL 5 MG PO TABS: 5.0000 mg | ORAL_TABLET | ORAL | 0 refills | Status: DC | PRN

## 2023-10-22 MED ORDER — LIDOCAINE HCL (CARDIAC) PF 100 MG/5ML IV SOSY
PREFILLED_SYRINGE | INTRAVENOUS | Status: DC | PRN
  Administered 2023-10-22: 70 mg via INTRAVENOUS

## 2023-10-22 MED ORDER — OXYCODONE HCL 5 MG PO TABS
ORAL_TABLET | ORAL | Status: AC
  Filled 2023-10-22: qty 1

## 2023-10-22 MED ORDER — EPHEDRINE SULFATE-NACL 50-0.9 MG/10ML-% IV SOSY
PREFILLED_SYRINGE | INTRAVENOUS | Status: DC | PRN
  Administered 2023-10-22: 5 mg via INTRAVENOUS

## 2023-10-22 MED ORDER — BUPIVACAINE-EPINEPHRINE (PF) 0.5% -1:200000 IJ SOLN
INTRAMUSCULAR | Status: DC | PRN
  Administered 2023-10-22: 7 mL

## 2023-10-22 MED ORDER — DEXAMETHASONE SODIUM PHOSPHATE 10 MG/ML IJ SOLN
INTRAMUSCULAR | Status: AC
  Filled 2023-10-22: qty 1

## 2023-10-22 MED ORDER — SURGIFLO WITH THROMBIN (HEMOSTATIC MATRIX KIT) OPTIME
TOPICAL | Status: DC | PRN
  Administered 2023-10-22: 1 via TOPICAL

## 2023-10-22 MED ORDER — PROPOFOL 10 MG/ML IV BOLUS
INTRAVENOUS | Status: DC | PRN
  Administered 2023-10-22: 50 mg via INTRAVENOUS
  Administered 2023-10-22: 100 mg via INTRAVENOUS

## 2023-10-22 MED ORDER — ONDANSETRON HCL 4 MG/2ML IJ SOLN
INTRAMUSCULAR | Status: AC
  Filled 2023-10-22: qty 2

## 2023-10-22 MED ORDER — ONDANSETRON HCL 4 MG/2ML IJ SOLN
INTRAMUSCULAR | Status: DC | PRN
  Administered 2023-10-22: 4 mg via INTRAVENOUS

## 2023-10-22 MED ORDER — METHYLPREDNISOLONE ACETATE 40 MG/ML IJ SUSP
INTRAMUSCULAR | Status: DC | PRN
  Administered 2023-10-22: 40 mg

## 2023-10-22 MED ORDER — CYCLOBENZAPRINE HCL 5 MG PO TABS: 5.0000 mg | ORAL_TABLET | Freq: Three times a day (TID) | ORAL | 0 refills | Status: DC | PRN

## 2023-10-22 MED ORDER — ROCURONIUM BROMIDE 100 MG/10ML IV SOLN
INTRAVENOUS | Status: DC | PRN
  Administered 2023-10-22: 40 mg via INTRAVENOUS
  Administered 2023-10-22: 20 mg via INTRAVENOUS

## 2023-10-22 MED ORDER — OXYCODONE HCL 5 MG PO TABS
5.0000 mg | ORAL_TABLET | Freq: Once | ORAL | Status: AC | PRN
  Administered 2023-10-22: 5 mg via ORAL

## 2023-10-22 MED ORDER — DOCUSATE SODIUM 100 MG PO CAPS: 100.0000 mg | ORAL_CAPSULE | Freq: Two times a day (BID) | ORAL | 0 refills | Status: DC

## 2023-10-22 MED ORDER — FENTANYL CITRATE (PF) 100 MCG/2ML IJ SOLN
INTRAMUSCULAR | Status: AC
  Filled 2023-10-22: qty 2

## 2023-10-22 MED ORDER — FENTANYL CITRATE (PF) 100 MCG/2ML IJ SOLN
INTRAMUSCULAR | Status: DC | PRN
  Administered 2023-10-22 (×2): 50 ug via INTRAVENOUS

## 2023-10-22 MED ORDER — 0.9 % SODIUM CHLORIDE (POUR BTL) OPTIME
TOPICAL | Status: DC | PRN
  Administered 2023-10-22: 500 mL

## 2023-10-22 SURGICAL SUPPLY — 33 items
BASIN KIT SINGLE STR (MISCELLANEOUS) ×2 IMPLANT
BRUSH SCRUB EZ 4% CHG (MISCELLANEOUS) ×2 IMPLANT
BUR NEURO DRILL SOFT 3.0X3.8M (BURR) ×2 IMPLANT
DERMABOND ADVANCED .7 DNX12 (GAUZE/BANDAGES/DRESSINGS) ×2 IMPLANT
DRAPE C-ARM XRAY 36X54 (DRAPES) ×4 IMPLANT
DRAPE LAPAROTOMY 100X77 ABD (DRAPES) ×2 IMPLANT
DRAPE MICROSCOPE SPINE 48X150 (DRAPES) ×2 IMPLANT
DRSG OPSITE POSTOP 3X4 (GAUZE/BANDAGES/DRESSINGS) ×2 IMPLANT
DRSG TEGADERM 4X4.75 (GAUZE/BANDAGES/DRESSINGS) ×2 IMPLANT
ELECT EZSTD 165MM 6.5IN (MISCELLANEOUS) ×1
ELECT REM PT RETURN 9FT ADLT (ELECTROSURGICAL) ×1
ELECTRODE EZSTD 165MM 6.5IN (MISCELLANEOUS) ×2 IMPLANT
ELECTRODE REM PT RTRN 9FT ADLT (ELECTROSURGICAL) ×2 IMPLANT
GLOVE BIOGEL PI IND STRL 8 (GLOVE) ×4 IMPLANT
GLOVE SURG SYN 7.5 E (GLOVE) ×1
GLOVE SURG SYN 7.5 PF PI (GLOVE) ×2 IMPLANT
GOWN SRG XL LVL 3 NONREINFORCE (GOWNS) ×2 IMPLANT
GOWN STRL REUS W/ TWL LRG LVL3 (GOWN DISPOSABLE) ×2 IMPLANT
KIT WILSON FRAME (KITS) ×2 IMPLANT
KNIFE BAYONET SHORT DISCETOMY (MISCELLANEOUS) IMPLANT
NDL SAFETY ECLIPSE 18X1.5 (NEEDLE) ×2 IMPLANT
NS IRRIG 500ML POUR BTL (IV SOLUTION) ×2 IMPLANT
PACK LAMINECTOMY ARMC (PACKS) ×2 IMPLANT
PAD ARMBOARD 7.5X6 YLW CONV (MISCELLANEOUS) ×4 IMPLANT
SURGIFLO W/THROMBIN 8M KIT (HEMOSTASIS) ×2 IMPLANT
SUT MNCRL 4-0 27 PS-2 XMFL (SUTURE) ×1
SUT STRATA 3-0 15 PS-2 (SUTURE) ×2 IMPLANT
SUT VIC AB 0 CT1 27XCR 8 STRN (SUTURE) ×2 IMPLANT
SUT VIC AB 2-0 CT1 18 (SUTURE) ×2 IMPLANT
SUTURE MNCRL 4-0 27XMF (SUTURE) IMPLANT
SYR 30ML LL (SYRINGE) ×4 IMPLANT
SYR 3ML LL SCALE MARK (SYRINGE) ×2 IMPLANT
TRAP FLUID SMOKE EVACUATOR (MISCELLANEOUS) ×2 IMPLANT

## 2023-10-22 NOTE — Discharge Instructions (Addendum)
Your surgeon has performed an operation on your lumbar spine (low back) to relieve pressure on one or more nerves. Many times, patients feel better immediately after surgery and can "overdo it." Even if you feel well, it is important that you follow these activity guidelines. If you do not let your back heal properly from the surgery, you can increase the chance of hardware complications and/or return of your symptoms. The following are instructions to help in your recovery once you have been discharged from the hospital.   Activity    No bending, lifting, or twisting ("BLT"). Avoid lifting objects heavier than 10 pounds (gallon milk jug).  Where possible, avoid household activities that involve lifting, bending, pushing, or pulling such as laundry, vacuuming, grocery shopping, and childcare. Try to arrange for help from friends and family for these activities while your back heals.  Increase physical activity slowly as tolerated.  Taking short walks is encouraged, but avoid strenuous exercise. Do not jog, run, bicycle, lift weights, or participate in any other exercises unless specifically allowed by your doctor. Avoid prolonged sitting, including car rides.  Talk to your doctor before resuming sexual activity.  You should not drive until cleared by your doctor.  Until released by your doctor, you should not return to work or school.  You should rest at home and let your body heal.   You may shower three days after your surgery.  After showering, lightly dab your incision dry. Do not take a tub bath or go swimming for 3 weeks, or until approved by your doctor at your follow-up appointment.  If you smoke, we strongly recommend that you quit.  Smoking has been proven to interfere with normal healing in your back and will dramatically reduce the success rate of your surgery. Please contact [facility] (800-QUIT-NOW) and use the resources at www.[facility].com for assistance in stopping  smoking.  Surgical Incision   If you have a dressing on your incision, you may remove it three days after your surgery. Keep your incision area clean and dry.  Your incision was closed with Dermabond glue. The glue should begin to peel away within about a week.  Diet            You may return to your usual diet. Be sure to stay hydrated.  When to Contact us  Although your surgery and recovery will likely be uneventful, you may have some residual numbness, aches, and pains in your back and/or legs. This is normal and should improve in the next few weeks.  However, should you experience any of the following, contact us immediately: New numbness or weakness Pain that is progressively getting worse, and is not relieved by your pain medications or rest Bleeding, redness, swelling, pain, or drainage from surgical incision Chills or flu-like symptoms Fever greater than 101.0 F (38.3 C) Problems with bowel or bladder functions Difficulty breathing or shortness of breath Warmth, tenderness, or swelling in your calf  Contact Information How to contact us:  If you have any questions/concerns before or after surgery, you can reach [place] at [phone], or you can send a mychart message. We can be reached by phone or mychart 8am-4pm, Monday-Friday.  *Please note: Calls after 4pm are forwarded to a third party answering service. Mychart messages are not routinely monitored during evenings, weekends, and holidays. Please call our office to contact the answering service for urgent concerns during non-business hours.     Information for Discharge Teaching: EXPAREL (bupivacaine liposome injectable suspension)  Pain relief is important to your recovery. The goal is to control your pain so you can move easier and return to your normal activities as soon as possible after your procedure. Your physician may use several types of medicines to manage pain, swelling, and more.  Your surgeon or  anesthesiologist gave you EXPAREL(bupivacaine) to help control your pain after surgery.  EXPAREL is a local anesthetic designed to release slowly over an extended period of time to provide pain relief by numbing the tissue around the surgical site. EXPAREL is designed to release pain medication over time and can control pain for up to 72 hours. Depending on how you respond to EXPAREL, you may require less pain medication during your recovery. EXPAREL can help reduce or eliminate the need for opioids during the first few days after surgery when pain relief is needed the most. EXPAREL is not an opioid and is not addictive. It does not cause sleepiness or sedation.   Important! A teal colored band has been placed on your arm with the date, time and amount of EXPAREL you have received. Please leave this armband in place for the full 96 hours following administration, and then you may remove the band. If you return to the hospital for any reason within 96 hours following the administration of EXPAREL, the armband provides important information that your health care providers to know, and alerts them that you have received this anesthetic.    Possible side effects of EXPAREL: Temporary loss of sensation or ability to move in the area where medication was injected. Nausea, vomiting, constipation Rarely, numbness and tingling in your mouth or lips, lightheadedness, or anxiety may occur. Call your doctor right away if you think you may be experiencing any of these sensations, or if you have other questions regarding possible side effects.  Follow all other discharge instructions given to you by your surgeon or nurse. Eat a healthy diet and drink plenty of water or other fluids.

## 2023-10-22 NOTE — Interval H&P Note (Signed)
History and Physical Interval Note:  10/22/2023 10:22 AM  Kristine Allison  has presented today for surgery, with the diagnosis of M48.061 Spinal stenosis, lumbar region, without neurogenic claudication R29.898 Right leg weakness.  The various methods of treatment have been discussed with the patient and family. After consideration of risks, benefits and other options for treatment, the patient has consented to  Procedure(s): RIGHT L5-S1 MINIMALLY INVASIVE (MIS) LAMINECTOMY AND DISCECTOMY (Right) as a surgical intervention.  The patient's history has been reviewed, patient examined, no change in status, stable for surgery.  I have reviewed the patient's chart and labs.  Questions were answered to the patient's satisfaction.    Heart and lungs are clear.  Continues to have pain going down the right lower extremity in the S1 distribution.  We did discuss that her A1c continues to be outside of normal range secondary to her diabetes.  We had a discussion with both her, her daughter, and her mother present about the increased risk of surgical intervention when A1c levels are elevated.  Given her weakness, and her long history of having difficulty keeping her A1c within range we feel like the surgery is indicated as long as she continues to want to go forward with surgery.  She had a previous discussion with anesthesia about the risks of undergoing anesthesia and surgical procedure given her issues as well.  Lovenia Kim

## 2023-10-22 NOTE — Inpatient Diabetes Management (Signed)
Inpatient Diabetes Program Recommendations  AACE/ADA: New Consensus Statement on Inpatient Glycemic Control   Target Ranges:  Prepandial:   less than 140 mg/dL      Peak postprandial:   less than 180 mg/dL (1-2 hours)      Critically ill patients:  140 - 180 mg/dL    Latest Reference Range & Units 10/22/23 09:30  Glucose-Capillary 70 - 99 mg/dL 098 (H)    Latest Reference Range & Units 10/12/23 14:30  Hemoglobin A1C 4.8 - 5.6 % 9.6 (H)   Review of Glycemic Control  Diabetes history: DM1 (does NOT make any insulin; requires basal, correction, and carb coverage insulin) Outpatient Diabetes medications: 70/30 12 units BID Current orders for Inpatient glycemic control: None; in PO area  Inpatient Diabetes Program Recommendations:    Insulin: If admitted following surgery, please consider ordering Semglee 10 units at bedtime, CBGs Q4H, and Novolog 0-9 units Q4H.  Thanks, Orlando Penner, RN, MSN, CDCES Diabetes Coordinator Inpatient Diabetes Program [phone] (Team Pager from 8am to 5pm)

## 2023-10-22 NOTE — Transfer of Care (Signed)
Immediate Anesthesia Transfer of Care Note  Patient: Kristine Allison  Procedure(s) Performed: RIGHT L5-S1 MINIMALLY INVASIVE (MIS) LAMINECTOMY AND DISCECTOMY (Right: Back)  Patient Location: PACU  Anesthesia Type:General  Level of Consciousness: drowsy and patient cooperative  Airway & Oxygen Therapy: Patient Spontanous Breathing and Patient connected to face mask oxygen  Post-op Assessment: Report given to RN and Post -op Vital signs reviewed and stable  Post vital signs: stable  Last Vitals:  Vitals Value Taken Time  BP 132/78 10/22/23 1300  Temp 36.3 C 10/22/23 1300  Pulse 91 10/22/23 1304  Resp 16 10/22/23 1304  SpO2 100 % 10/22/23 1304  Vitals shown include unfiled device data.  Last Pain:  Vitals:   10/22/23 1300  TempSrc:   PainSc: Asleep         Complications: No notable events documented.

## 2023-10-22 NOTE — Interval H&P Note (Signed)
History and Physical Interval Note:  10/22/2023 10:15 AM  Kristine Allison  has presented today for surgery, with the diagnosis of M48.061 Spinal stenosis, lumbar region, without neurogenic claudication R29.898 Right leg weakness.  The various methods of treatment have been discussed with the patient and family. After consideration of risks, benefits and other options for treatment, the patient has consented to  Procedure(s): RIGHT L5-S1 MINIMALLY INVASIVE (MIS) LAMINECTOMY AND DISCECTOMY (Right) as a surgical intervention.  The patient's history has been reviewed, patient examined, no change in status, stable for surgery.  I have reviewed the patient's chart and labs.  Questions were answered to the patient's satisfaction.    Heart and lungs clear  Lovenia Kim

## 2023-10-22 NOTE — Op Note (Addendum)
Indications: Kristine Allison is suffering from lumbar radiculopathy. The patient tried and failed conservative management, prompting surgical intervention.  Findings: Disk herniation causing lateral displacement and compression of the S1 nerve root  Preoperative Diagnosis Lumbar Radiculopathy Disk herniation M48.061 Spinal stenosis, lumbar region R29.898 Right leg weakness   Postoperative Diagnosis: same   EBL: 20 ml IVF: see anesthesia record Drains: none Disposition: Extubated and Stable to PACU Complications: none  No foley catheter was placed.   Preoperative Note:   Risks of surgery discussed include: infection, bleeding, stroke, coma, death, paralysis, CSF leak, nerve/spinal cord injury, numbness, tingling, weakness, complex regional pain syndrome, recurrent stenosis and/or disc herniation, vascular injury, development of instability, neck/back pain, need for further surgery, persistent symptoms, development of deformity, and the risks of anesthesia. The patient understood these risks and agreed to proceed.  Operative Note:   1) Right L5/S1 microdiscectomy  The patient was then brought from the preoperative center with intravenous access established.  The patient underwent general anesthesia and endotracheal tube intubation, and was then rotated on the Langston rail top where all pressure points were appropriately padded.  The skin was then thoroughly cleansed.  Perioperative antibiotic prophylaxis was administered.  Sterile prep and drapes were then applied and a timeout was then observed.  C-arm was brought into the field under sterile conditions, and the L5-S1 disc space identified and marked with an incision on the right 1cm lateral to midline.  Once this was complete a 2 cm incision was opened with the use of a #10 blade knife.  The Metrx tubes were sequentially advanced under lateral fluoroscopy until a 18 x 50 mm Metrx tube was placed over the facet and lamina and  secured to the bed.    The microscope was then sterilely brought into the field and muscle creep was hemostased with a bipolar and resected with a pituitary rongeur.  A Bovie extender was then used to expose the spinous process and lamina.  Careful attention was placed to not violate the facet capsule. A 3 mm matchstick drill bit was then used to make a hemi-laminotomy trough until the ligamentum flavum was exposed.  This was extended to the base of the spinous process.  Once this was complete and the underlying ligamentum flavum was visualized, the ligamentum was dissected with an up angle curette and resected with a #2 and #3 mm biting Kerrison.  The laminotomy opening was also expanded in similar fashion and hemostasis was obtained with Surgifoam and a patty as well as bone wax.  The rostral aspect of the caudal level of the lamina was also resected with a #2 biting Kerrison effort to further enhance exposure.  Once the underlying dura was visualized a Penfield 4 was then used to dissect and expose the traversing nerve root.  Once this was identified a nerve root retractor suction was used to mobilize this medially.  The venous plexus was hemostased with Surgifoam and light bipolar use.  A small penfied was then used to make a small annulotomy within the disc space and disc space contents were noted to come through the annulus.    The disc herniation was identified and dissected free using a balltip probe. The pituitary rongeur was used to remove the extruded disc fragments. Once the thecal sac and nerve root were noted to be relaxed and under less tension the ball-tipped feeler was passed along the foramen distally to ensure no residual compression was noted.    Depo-Medrol was placed along  the nerve root.  The area was irrigated. The tube system was then removed under microscopic visualization and hemostasis was obtained with a bipolar.    The fascial layer was reapproximated with the use of a 0- Vicryl  suture.  Subcutaneous tissue layer was reapproximated using 2-0 Vicryl suture.  3-0 monocryl was used on the skin. The skin was then cleansed and Dermabond was used to close the skin opening.  Patient was then rotated back to the preoperative bed awakened from anesthesia and taken to recovery all counts are correct in this case.   I performed the entire procedure with the assistance of Joan Flores, [place] as an Designer, television/film set. An assistant was required for this procedure due to the complexity.  The assistant provided assistance in tissue manipulation and suction, and was required for the successful and safe performance of the procedure. I performed the critical portions of the procedure.   Lovenia Kim MD

## 2023-10-22 NOTE — Anesthesia Preprocedure Evaluation (Addendum)
Anesthesia Evaluation  Patient identified by MRN, date of birth, ID band Patient awake    Reviewed: Allergy & Precautions, NPO status , Patient's Chart, lab work & pertinent test results  History of Anesthesia Complications Negative for: history of anesthetic complications  Airway Mallampati: II  TM Distance: >3 FB Neck ROM: full    Dental  (+) Poor Dentition   Pulmonary asthma , former smoker   Pulmonary exam normal        Cardiovascular + Past MI  Normal cardiovascular exam     Neuro/Psych  Headaches PSYCHIATRIC DISORDERS Anxiety Depression       GI/Hepatic Neg liver ROS,GERD  ,,  Endo/Other  diabetes, Poorly Controlled, Type 1, Insulin Dependent    Renal/GU Renal disease  negative genitourinary   Musculoskeletal   Abdominal   Peds  Hematology negative hematology ROS (+)   Anesthesia Other Findings Past Medical History: No date: Acute renal failure syndrome (HCC) No date: Acute respiratory failure with hypoxia and hypercapnia (HCC) No date: AKI (acute kidney injury) (HCC) No date: Anxiety No date: Asthma No date: Depression No date: Diabetic ketoacidosis without coma associated with type 1  diabetes mellitus (HCC) No date: Edema of the tongue No date: GERD (gastroesophageal reflux disease) No date: High anion gap metabolic acidosis (HAGMA) No date: Hyperkalemia No date: Hypotension, unspecified No date: Insomnia No date: Malnutrition of moderate degree (HCC) No date: Medically noncompliant     Comment:  a.) failure to manage/treat T1DM resulting in unmanaged               disease and frequent/multiple admissions for DKA and               HAGMA No date: Migraine headache 2015: NSTEMI (non-ST elevated myocardial infarction) (HCC)     Comment:  a.) in setting of severe hyperglycemia. (+) chest pain.               Troponins trended: 0.60--> 0.58-->0.45. Cardiology               consulted. (+) ECG changes  (TWIs in  I and aVL), however               felt to be secondary to lead placement; not felt to be               "true NSTEMI", but rather of GI etiology (pain resolved               with GI cocktail) No date: Tachycardia  Past Surgical History: No date: NO PAST SURGERIES  BMI    Body Mass Index: 20.25 kg/m      Reproductive/Obstetrics negative OB ROS                              Anesthesia Physical Anesthesia Plan  ASA: 4  Anesthesia Plan: General   Post-op Pain Management: Ofirmev IV (intra-op)* and Dilaudid IV   Induction: Intravenous  PONV Risk Score and Plan: 3 and Propofol infusion and TIVA  Airway Management Planned: Oral ETT  Additional Equipment:   Intra-op Plan:   Post-operative Plan: Extubation in OR  Informed Consent: I have reviewed the patients History and Physical, chart, labs and discussed the procedure including the risks, benefits and alternatives for the proposed anesthesia with the patient or authorized representative who has indicated his/her understanding and acceptance.     Dental Advisory Given  Plan Discussed with: Anesthesiologist, CRNA  and Surgeon  Anesthesia Plan Comments: (Patient consented for risks of anesthesia including but not limited to:  - adverse reactions to medications - damage to eyes, teeth, lips or other oral mucosa - nerve damage due to positioning  - sore throat or hoarseness - Damage to heart, brain, nerves, lungs, other parts of body or loss of life  Patient voiced understanding and assent. Patient has no medical provider overseeing her diabetes. Patient reported to PAT staff that she was "feeling bad" about a year ago and decided to start self treating her diabetes. She purchased OTC insulin from [employer] and a glucometer from [employer]. After "playing around" with the dosages, patient reporting that she landed an average need of 12 units of subcutaneous insulin twice a day. A1C on 1/20  9.6  Had a frank discussion with patient that she is at increased risk for surgical and anesthetic complications including but not limited to poor wound healing, infection, coagulopathy, MI, arrhythmias, stroke, kidney injury and death. Patient voiced understanding and wishes to proceed. Patient compliance concerns and uncontrolled diabetic concerns with Dr. Katrinka Blazing)         Anesthesia Quick Evaluation

## 2023-10-22 NOTE — Anesthesia Procedure Notes (Signed)
Procedure Name: Intubation Date/Time: 10/22/2023 11:03 AM  Performed by: Emeterio Reeve, CRNAPre-anesthesia Checklist: Patient identified, Emergency Drugs available, Suction available and Patient being monitored Patient Re-evaluated:Patient Re-evaluated prior to induction Oxygen Delivery Method: Circle system utilized Preoxygenation: Pre-oxygenation with 100% oxygen Induction Type: IV induction Ventilation: Mask ventilation without difficulty Laryngoscope Size: McGrath and 3 Grade View: Grade I Tube type: Oral Tube size: 7.0 mm Number of attempts: 1 Airway Equipment and Method: Stylet and Oral airway Placement Confirmation: ETT inserted through vocal cords under direct vision, positive ETCO2 and breath sounds checked- equal and bilateral Secured at: 22 cm Tube secured with: Tape Dental Injury: Teeth and Oropharynx as per pre-operative assessment  Comments: Cords clear; no trauma. CA

## 2023-10-23 ENCOUNTER — Encounter: Payer: Self-pay | Admitting: Neurosurgery

## 2023-10-23 ENCOUNTER — Telehealth: Payer: Self-pay | Admitting: Neurosurgery

## 2023-10-23 ENCOUNTER — Telehealth: Payer: Self-pay

## 2023-10-23 NOTE — Anesthesia Postprocedure Evaluation (Signed)
Anesthesia Post Note  Patient: [name]  Procedure(s) Performed: RIGHT L5-S1 MINIMALLY INVASIVE (MIS) LAMINECTOMY AND DISCECTOMY (Right: Back)  Patient location during evaluation: PACU Anesthesia Type: General Level of consciousness: awake and alert Pain management: pain level controlled Vital Signs Assessment: post-procedure vital signs reviewed and stable Respiratory status: spontaneous breathing, nonlabored ventilation, respiratory function stable and patient connected to nasal cannula oxygen Cardiovascular status: blood pressure returned to baseline and stable Postop Assessment: no apparent nausea or vomiting Anesthetic complications: no   No notable events documented.   Last Vitals:  Vitals:   10/22/23 1400 10/22/23 1423  BP: 120/78 125/78  Pulse: 87 87  Resp: 13 14  Temp: 36.6 C 36.5 C  SpO2: 100% 100%    Last Pain:  Vitals:   10/22/23 1423  TempSrc: Temporal  PainSc:                  [name]

## 2023-10-23 NOTE — Telephone Encounter (Signed)
Patient is calling to request a work note stating that she had surgery on 10/22/2023 and her anticipated time out of work.

## 2023-10-23 NOTE — Telephone Encounter (Signed)
I called Kristine Allison to discuss her status of going back to work.   We discussed her job and her ability to go back to work with restrictions. She asked about going back at 2 weeks. I reiterated that she would not be able to lift up to 10 lbs after surgery until 6 weeks post-op.   We decided that we would keep her out of work until 6 weeks post-op then evaluate when she comes into the office if she needs to be out any longer. I told her that she should refer to the common restrictions after surgery section of her paperwork as a guide to discuss with her employer about these restrictions.   I will type out the out of work letter for her to get through my chart.

## 2023-10-23 NOTE — Telephone Encounter (Signed)
Patient work note send to my chart as requested by patient.

## 2023-11-04 ENCOUNTER — Ambulatory Visit (INDEPENDENT_AMBULATORY_CARE_PROVIDER_SITE_OTHER): Payer: Commercial Managed Care - PPO | Admitting: Physician Assistant

## 2023-11-04 VITALS — BP 128/78 | Temp 98.0°F | Ht 64.0 in | Wt 118.0 lb

## 2023-11-04 DIAGNOSIS — M48061 Spinal stenosis, lumbar region without neurogenic claudication: Secondary | ICD-10-CM

## 2023-11-04 DIAGNOSIS — Z09 Encounter for follow-up examination after completed treatment for conditions other than malignant neoplasm: Secondary | ICD-10-CM

## 2023-11-04 DIAGNOSIS — Z9889 Other specified postprocedural states: Secondary | ICD-10-CM

## 2023-11-04 DIAGNOSIS — R29898 Other symptoms and signs involving the musculoskeletal system: Secondary | ICD-10-CM

## 2023-11-04 MED ORDER — OXYCODONE HCL 5 MG PO TABS: 5.0000 mg | ORAL_TABLET | ORAL | 0 refills | Status: DC | PRN

## 2023-11-04 MED ORDER — CYCLOBENZAPRINE HCL 5 MG PO TABS: 5.0000 mg | ORAL_TABLET | Freq: Three times a day (TID) | ORAL | 0 refills | Status: DC | PRN

## 2023-11-04 NOTE — Progress Notes (Signed)
   REFERRING PHYSICIAN:  No referring provider defined for this encounter.  DOS: 10/22/23, Right L5-S1 MIS laminectomy and discectomy  HISTORY OF PRESENT ILLNESS: Page E Dhrithi Riche is approximately 2 weeks status post R L5-S1 MIS laminectomy and discectomy. she is doing well. Her pain is decreased and she has more movement and feeling in her right foot.  PHYSICAL EXAMINATION:  General: Patient is well developed, well nourished, calm, collected, and in no apparent distress.   NEUROLOGICAL:  General: In no acute distress.   Awake, alert, oriented to person, place, and time.  Pupils equal round and reactive to light.  Facial tone is symmetric.   There is no pronator drift.   Strength:            Side Iliopsoas Quads Hamstring PF DF EHL  R 5 5 5 5 5 5   L 5 5 5 5 5 5    Incision c/d/I. Some skin glue still noted at proximal aspect of incision   ROS (Neurologic):  Negative except as noted above  IMAGING: No new imaging  ASSESSMENT/PLAN:  Caprina Wussow is approximately 2 weeks status post R L5-S1 MIS laminectomy and discectomy. she is doing well. Her pain is decreased and she has more movement and feeling in her right foot.I have advised the patient to lift up to 10 pounds until 6 weeks after surgery, then increase up to 25 pounds until 12 weeks after surgery.  After 12 weeks post-op, the patient advised to increase activity as tolerated.  -Refills of Oxycodone and Flexeril sent. Plan to see back in one month  Advised to contact the office if any questions or concerns arise.  Joan Flores PA-C Department of neurosurgery

## 2023-11-17 ENCOUNTER — Other Ambulatory Visit: Payer: Self-pay | Admitting: Physician Assistant

## 2023-11-18 ENCOUNTER — Other Ambulatory Visit: Payer: Self-pay | Admitting: Physician Assistant

## 2023-11-18 ENCOUNTER — Telehealth: Payer: Self-pay

## 2023-11-18 MED ORDER — OXYCODONE HCL 5 MG PO TABS
5.0000 mg | ORAL_TABLET | ORAL | 0 refills | Status: DC | PRN
Start: 2023-11-18 — End: 2024-01-29

## 2023-11-18 NOTE — Telephone Encounter (Addendum)
 Received oxycodone authorization request from [employer] for oxycodone. (Key: [id number]). This has been approved and is valid 11/18/23-12/18/23. I have faxed a copy of this telephone encounter to [facility] to notify them of the approval.

## 2023-12-02 ENCOUNTER — Ambulatory Visit (INDEPENDENT_AMBULATORY_CARE_PROVIDER_SITE_OTHER): Payer: Commercial Managed Care - PPO | Admitting: Neurosurgery

## 2023-12-02 ENCOUNTER — Encounter: Payer: Self-pay | Admitting: Neurosurgery

## 2023-12-02 VITALS — BP 120/72 | Temp 98.2°F | Ht 64.0 in | Wt 118.0 lb

## 2023-12-02 DIAGNOSIS — M5126 Other intervertebral disc displacement, lumbar region: Secondary | ICD-10-CM

## 2023-12-02 DIAGNOSIS — Z9889 Other specified postprocedural states: Secondary | ICD-10-CM | POA: Insufficient documentation

## 2023-12-02 DIAGNOSIS — R29898 Other symptoms and signs involving the musculoskeletal system: Secondary | ICD-10-CM

## 2023-12-02 NOTE — Progress Notes (Signed)
   REFERRING PHYSICIAN:  No referring provider defined for this encounter.  DOS: 10/22/23, Right L5-S1 MIS laminectomy and discectomy  HISTORY OF PRESENT ILLNESS: Kristine Allison is approximately 6 weeks status post R L5-S1 MIS laminectomy and discectomy.  Overall her back pain has improved significantly.  Her leg pain is almost completely resolved.  She gets intermittent pain sometimes when she is sitting for a long time but otherwise is very happy with her outcome.  She looks forward to going back to work next week.   PHYSICAL EXAMINATION:  General: Patient is well developed, well nourished, calm, collected, and in no apparent distress.   NEUROLOGICAL:  General: In no acute distress.   Awake, alert, oriented to person, place, and time.  Pupils equal round and reactive to light.  Facial tone is symmetric.   There is no pronator drift.   Strength:            Side Iliopsoas Quads Hamstring PF DF EHL  R 5 5 5 5 5 5   L 5 5 5 5 5 5    Incision c/d/I.   ROS (Neurologic):  Negative except as noted above  IMAGING: No new imaging  ASSESSMENT/PLAN:  Kristine Allison is approximately 6 weeks status post R L5-S1 MIS laminectomy and discectomy.  Overall doing very well.  Her strength is improved significantly.  At this point she can increase her weight restriction to 25 pounds.  We reviewed her work note which she is looking forward to returning.  Will plan on seeing her in approximately 6 more weeks for her 49-month follow-up.  Advised to contact the office if any questions or concerns arise.  Lovenia Kim, MD Department of neurosurgery

## 2024-01-13 ENCOUNTER — Encounter: Payer: Commercial Managed Care - PPO | Admitting: Physician Assistant

## 2024-01-21 ENCOUNTER — Encounter: Payer: Self-pay | Admitting: Physician Assistant

## 2024-01-29 ENCOUNTER — Ambulatory Visit (INDEPENDENT_AMBULATORY_CARE_PROVIDER_SITE_OTHER): Admitting: Physician Assistant

## 2024-01-29 ENCOUNTER — Encounter: Payer: Self-pay | Admitting: Physician Assistant

## 2024-01-29 VITALS — BP 108/64 | Temp 98.2°F | Ht 64.0 in | Wt 118.0 lb

## 2024-01-29 DIAGNOSIS — M62838 Other muscle spasm: Secondary | ICD-10-CM

## 2024-01-29 MED ORDER — CYCLOBENZAPRINE HCL 5 MG PO TABS: 5.0000 mg | ORAL_TABLET | Freq: Three times a day (TID) | ORAL | 6 refills | Status: AC | PRN

## 2024-01-29 MED ORDER — CYCLOBENZAPRINE HCL 5 MG PO TABS
5.0000 mg | ORAL_TABLET | Freq: Three times a day (TID) | ORAL | Status: DC | PRN
Start: 2024-01-29 — End: 2024-01-29

## 2024-01-29 NOTE — Progress Notes (Unsigned)
   REFERRING PHYSICIAN:  No referring provider defined for this encounter.  DOS: 10/22/23, Right L5-S1 MIS laminectomy and discectomy  HISTORY OF PRESENT ILLNESS: Kristine Allison is over 3 months status post R L5-S1 MIS laminectomy and discectomy.  Overall her back pain has improved significantly.  Her leg pain is almost completely resolved.  She gets intermittent pain sometimes when she is sitting for a long time but otherwise is very happy with her outcome.    Only complaint today is regarding muscle spasms and cramping in her legs.  She has ran out of the muscle relaxer unfortunately.   PHYSICAL EXAMINATION:  General: Patient is well developed, well nourished, calm, collected, and in no apparent distress.   NEUROLOGICAL:  General: In no acute distress.   Awake, alert, oriented to person, place, and time.  Pupils equal round and reactive to light.  Facial tone is symmetric.      Strength:            Side Iliopsoas Quads Hamstring PF DF EHL  R 5 5 5 5 5 5   L 5 5 5 5 5 5    Incision c/d/I.   ROS (Neurologic):  Negative except as noted above  IMAGING: No new imaging  ASSESSMENT/PLAN:  Kristine Allison is over 3 months status post R L5-S1 MIS laminectomy and discectomy.  Overall her back pain has improved significantly.  Her leg pain is almost completely resolved.  She gets intermittent pain sometimes when she is sitting for a long time but otherwise is very happy with her outcome.  Only complaint today is regarding muscle spasms and cramping in her legs.  She has ran out of the muscle relaxer unfortunately.  Pleasure to see patient in clinic today.  She does have some muscle spasms and I have went ahead and sent in Flexeril  for this.  Advised patient if this does not help with her symptoms or if she has additional concerns to please do not hesitate in contacting me.  Otherwise I am happy that she is doing very well post operatively.     Advised to contact the office  if any questions or concerns arise.  Ludwig Safer, PA-C Department of neurosurgery

## 2024-05-04 ENCOUNTER — Ambulatory Visit
Admission: EM | Admit: 2024-05-04 | Discharge: 2024-05-04 | Disposition: A | Discharge status: Home / Self Care | Attending: Emergency Medicine | Admitting: Emergency Medicine

## 2024-05-04 DIAGNOSIS — B349 Viral infection, unspecified: Secondary | ICD-10-CM

## 2024-05-04 LAB — POC SOFIA SARS ANTIGEN FIA: SARS Coronavirus 2 Ag: NEGATIVE

## 2024-05-04 LAB — POCT RAPID STREP A (OFFICE): Rapid Strep A Screen: NEGATIVE

## 2024-05-04 MED ORDER — ONDANSETRON 4 MG PO TBDP: 4.0000 mg | ORAL_TABLET | Freq: Three times a day (TID) | ORAL | 0 refills | Status: AC | PRN

## 2024-05-04 NOTE — Discharge Instructions (Addendum)
 The COVID and strep tests are negative.   Take the antinausea medication as directed.    Keep yourself hydrated with clear liquids, such as water .    Go to the emergency department if you have worsening symptoms.    Follow up with your primary care provider.

## 2024-05-04 NOTE — ED Triage Notes (Addendum)
 Patient to Urgent Care with complaints of sore throat/ productive cough/ nasal congestion/ back pain/ nausea and vomiting (5 episodes). Unsure of any fevers.   Symptoms x2 days. Multiple sick contacts at work.   Using nasal spray/ tylenol  and ibuprofen .

## 2024-05-04 NOTE — ED Provider Notes (Signed)
 Kristine Allison    CSN: [id number] Arrival date & time: 05/04/24  1103      History   Chief Complaint Chief Complaint  Patient presents with   Cough   Sore Throat   Emesis    HPI Kristine Allison Kristine Allison is a 43 y.o. female.  Patient presents with congestion, sore throat, cough, back pain, nausea, vomiting since yesterday.  No fever, shortness of breath, abdominal pain, diarrhea.  She has been treating her symptoms with Tylenol  and ibuprofen .  The history is provided by the patient and medical records.    Past Medical History:  Diagnosis Date   Acute renal failure syndrome (HCC)    Acute respiratory failure with hypoxia and hypercapnia (HCC)    AKI (acute kidney injury) (HCC)    Anxiety    Asthma    Depression    Diabetic ketoacidosis without coma associated with type 1 diabetes mellitus (HCC)    Edema of the tongue    GERD (gastroesophageal reflux disease)    High anion gap metabolic acidosis (HAGMA)    Hyperkalemia    Hypotension, unspecified    Insomnia    Malnutrition of moderate degree (HCC)    Medically noncompliant    a.) failure to manage/treat T1DM resulting in unmanaged disease and frequent/multiple admissions for DKA and HAGMA   Migraine headache    NSTEMI (non-ST elevated myocardial infarction) (HCC) 2015   a.) in setting of severe hyperglycemia. (+) chest pain. Troponins trended: 0.60--> 0.58-->0.45. Cardiology consulted. (+) ECG changes (TWIs in  I and aVL), however felt to be secondary to lead placement; not felt to be true NSTEMI, but rather of GI etiology (pain resolved with GI cocktail)   Tachycardia     Patient Active Problem List   Diagnosis Date Noted   Post-operative state 12/02/2023   HNP (herniated nucleus pulposus), lumbar 10/22/2023   Lumbar radiculopathy 10/22/2023   Right leg weakness 10/22/2023   Hyperkalemia    AKI (acute kidney injury) (HCC)    Acute respiratory failure with hypoxia and hypercapnia (HCC)    High anion gap  metabolic acidosis    Malnutrition of moderate degree 05/09/2019   DKA, type 1 (HCC) 05/08/2019   Anxiety 08/07/2018   DKA (diabetic ketoacidosis) (HCC) 08/07/2018   Edema of the tongue 03/26/2018   Dehydration    Diabetic ketoacidosis without coma associated with type 1 diabetes mellitus (HCC)    Yeast infection 02/09/2015   Acute renal failure syndrome (HCC)    Tachycardia 11/03/2014   Insomnia 09/08/2014   Dizziness 09/08/2014   Migraine headache 12/14/2013   Elevated troponin I level 11/19/2013   Acute myocardial infarction, subendocardial infarction, initial episode of care (HCC) 11/18/2013   Noncompliance with medication regimen 11/15/2013   Leg pain 09/08/2013   Loss of weight 09/08/2013   Depression 09/08/2013   Hypotension, unspecified 09/08/2013   Tobacco use disorder 08/01/2013   Diabetes mellitus type 1, uncontrolled 06/24/2012   GERD (gastroesophageal reflux disease) 06/17/2012    Past Surgical History:  Procedure Laterality Date   LUMBAR LAMINECTOMY/DECOMPRESSION MICRODISCECTOMY Right 10/22/2023   Procedure: RIGHT L5-S1 MINIMALLY INVASIVE (MIS) LAMINECTOMY AND DISCECTOMY;  Surgeon: Claudene Penne ORN, MD;  Location: [facility] ORS;  Service: Neurosurgery;  Laterality: Right;   NO PAST SURGERIES      OB History   No obstetric history on file.      Home Medications    Prior to Admission medications   Medication Sig Start Date End Date Taking? Authorizing Provider  ondansetron  (ZOFRAN -ODT) 4 MG disintegrating tablet Take 1 tablet (4 mg total) by mouth every 8 (eight) hours as needed for nausea or vomiting. 05/04/24  Yes Corlis Burnard DEL, NP  acetaminophen  (TYLENOL ) 325 MG tablet Take 650 mg by mouth every 6 (six) hours as needed (pain.).    [provider]  Collagen-Vitamin C -Biotin (COLLAGEN PO) Take 1 tablet by mouth daily. Gummy    [provider]  Cyanocobalamin (VITAMIN B12 PO) Take 1 tablet by mouth daily. Gummy    [provider]   cyclobenzaprine  (FLEXERIL ) 5 MG tablet Take 1 tablet (5 mg total) by mouth 3 (three) times daily as needed for muscle spasms. 01/29/24   Ulis Bottcher, PA-C  insulin  NPH-regular Human (70-30) 100 UNIT/ML injection Inject 12 Units into the skin 2 (two) times daily with a meal.    [provider]  senna (SENOKOT) 8.6 MG TABS tablet Take 1 tablet (8.6 mg total) by mouth daily. 10/22/23   Ulis Bottcher, PA-C    Family History Family History  Problem Relation Age of Onset   Heart disease Other        No family history    Social History Social History   Tobacco Use   Smoking status: Former    Current packs/day: 0.00    Average packs/day: 0.3 packs/day for 20.0 years (5.0 ttl pk-yrs)    Types: Cigarettes    Quit date: 04/2023    Years since quitting: 1.0   Smokeless tobacco: Never  Vaping Use   Vaping status: Every Day  Substance Use Topics   Alcohol use: No    Alcohol/week: 0.0 standard drinks of alcohol   Drug use: No     Allergies   Latex   Review of Systems Review of Systems  Constitutional:  Negative for chills and fever.  HENT:  Positive for congestion and sore throat. Negative for ear pain.   Respiratory:  Positive for cough. Negative for shortness of breath.   Cardiovascular:  Negative for chest pain and palpitations.  Gastrointestinal:  Positive for nausea and vomiting. Negative for abdominal pain and diarrhea.     Physical Exam Triage Vital Signs ED Triage Vitals  Encounter Vitals Group     BP 05/04/24 1136 127/79     Girls Systolic BP Percentile --      Girls Diastolic BP Percentile --      Boys Systolic BP Percentile --      Boys Diastolic BP Percentile --      Pulse Rate 05/04/24 1136 100     Resp 05/04/24 1136 18     Temp 05/04/24 1136 98.4 F (36.9 C)     Temp src --      SpO2 05/04/24 1136 95 %     Weight --      Height --      Head Circumference --      Peak Flow --      Pain Score 05/04/24 1147 8     Pain Loc --      Pain  Education --      Exclude from Growth Chart --    No data found.  Updated Vital Signs BP 127/79   Pulse 100   Temp 98.4 F (36.9 C)   Resp 18   LMP 06/11/2013 (Approximate)   SpO2 95%   Visual Acuity Right Eye Distance:   Left Eye Distance:   Bilateral Distance:    Right Eye Near:   Left Eye Near:  Bilateral Near:     Physical Exam Constitutional:      General: She is not in acute distress. HENT:     Right Ear: Tympanic membrane normal.     Left Ear: Tympanic membrane normal.     Nose: Nose normal.     Mouth/Throat:     Mouth: Mucous membranes are moist.     Pharynx: Oropharynx is clear.  Cardiovascular:     Rate and Rhythm: Normal rate and regular rhythm.     Heart sounds: Normal heart sounds.  Pulmonary:     Effort: Pulmonary effort is normal. No respiratory distress.     Breath sounds: Normal breath sounds.  Abdominal:     General: Bowel sounds are normal.     Palpations: Abdomen is soft.     Tenderness: There is no abdominal tenderness. There is no guarding or rebound.  Neurological:     Mental Status: She is alert.      UC Treatments / Results  Labs (all labs ordered are listed, but only abnormal results are displayed) Labs Reviewed  POC SOFIA SARS ANTIGEN FIA  POCT RAPID STREP A (OFFICE)    EKG   Radiology No results found.  Procedures Procedures (including critical care time)  Medications Ordered in UC Medications - No data to display  Initial Impression / Assessment and Plan / UC Course  I have reviewed the triage vital signs and the nursing notes.  Pertinent labs & imaging results that were available during my care of the patient were reviewed by me and considered in my medical decision making (see chart for details).    Viral illness.  Afebrile and vital signs are stable.  Rapid strep negative; COVID negative.  Treating nausea and vomiting with Zofran .  Discussed clear liquid diet.  Instructed patient to advance diet as tolerated.   Discussed maintaining oral hydration at home; ED precautions discussed.  Education provided on viral illness.  Instructed patient to follow-up with her PCP.  ED precautions given.  She agrees to plan of care.   Final Clinical Impressions(s) / UC Diagnoses   Final diagnoses:  Viral illness     Discharge Instructions      The COVID and strep tests are negative.   Take the antinausea medication as directed.    Keep yourself hydrated with clear liquids, such as water .    Go to the emergency department if you have worsening symptoms.    Follow up with your primary care provider.          ED Prescriptions     Medication Sig Dispense Auth. Provider   ondansetron  (ZOFRAN -ODT) 4 MG disintegrating tablet Take 1 tablet (4 mg total) by mouth every 8 (eight) hours as needed for nausea or vomiting. 20 tablet Corlis Burnard DEL, NP      PDMP not reviewed this encounter.   Corlis Burnard DEL, NP 05/04/24 1228

## 2024-07-25 ENCOUNTER — Telehealth: Payer: Self-pay | Admitting: Physician Assistant

## 2024-07-25 NOTE — Telephone Encounter (Signed)
 Kristine Allison N 07/25/2024 11:50 AM EST lvm ------------------------------------ [name] F 07/22/2024 08:24 AM EDT Left message to call our office back.  ------------------------------------ Kristine Allison SAILOR 07/21/2024 02:52 PM EDT lvm

## 2024-08-01 ENCOUNTER — Ambulatory Visit (INDEPENDENT_AMBULATORY_CARE_PROVIDER_SITE_OTHER): Admitting: Physician Assistant

## 2024-08-01 ENCOUNTER — Encounter: Payer: Self-pay | Admitting: Physician Assistant

## 2024-08-01 VITALS — BP 122/72 | Ht 64.0 in | Wt 118.0 lb

## 2024-08-01 DIAGNOSIS — Z9889 Other specified postprocedural states: Secondary | ICD-10-CM

## 2024-08-01 DIAGNOSIS — M549 Dorsalgia, unspecified: Secondary | ICD-10-CM

## 2024-08-01 DIAGNOSIS — M48061 Spinal stenosis, lumbar region without neurogenic claudication: Secondary | ICD-10-CM

## 2024-08-01 DIAGNOSIS — Z09 Encounter for follow-up examination after completed treatment for conditions other than malignant neoplasm: Secondary | ICD-10-CM

## 2024-08-01 MED ORDER — METHYLPREDNISOLONE 4 MG PO TBPK
ORAL_TABLET | ORAL | 0 refills | Status: AC
Start: 2024-08-01 — End: ?

## 2024-08-01 NOTE — Progress Notes (Signed)
   REFERRING PHYSICIAN:  No referring provider defined for this encounter.  DOS: 10/22/23, Right L5-S1 MIS laminectomy and discectomy  HISTORY OF PRESENT ILLNESS: Kristine Allison is over 3 months status post R L5-S1 MIS laminectomy and discectomy.  Overall her back pain has improved significantly.  Her leg pain is almost completely resolved.  She gets intermittent pain sometimes when she is sitting for a long time but otherwise is very happy with her outcome.    Only complaint today is regarding muscle spasms and cramping in her legs.  She has ran out of the muscle relaxer unfortunately.  08/01/24  Saw patient today in follow-up for increasing back pain over the last week or 2.  She last had surgery 10-1/2 months ago but is recently started to have pain near her incision site.  It is intermittent in nature and feels like a burning sensation.  She notices it more when she bends over or is walking too much.  She denies any radiation down her legs.  Denies any new weakness, saddle anesthesia.  She has been taking Tylenol , Flexeril , and ibuprofen  to help with her pain.   PHYSICAL EXAMINATION:  General: Patient is well developed, well nourished, calm, collected, and in no apparent distress.   NEUROLOGICAL:  General: In no acute distress.   Awake, alert, oriented to person, place, and time.  Pupils equal round and reactive to light.  Facial tone is symmetric.      Strength:            Side Iliopsoas Quads Hamstring PF DF EHL  R 5 5 5 5 5 5   L 4+ 5 4+ 5 5 5    Incision c/d/I.   ROS (Neurologic):  Negative except as noted above  IMAGING: No new imaging  ASSESSMENT/PLAN:  Kristine Allison is over 10.5 months status post R L5-S1 MIS laminectomy and discectomy.  She was having some increased back pain near previous incision site that was intermittent in nature and burning.  Likely, no radiation down her legs and no new weakness that she has noticed.  We went over activities and  positions to avoid.  Plan includes the following moving forward:  -Referral sent for physical therapy - Medrol  Dosepak given - Told the patient to please keep me updated.  If her pain persists or if she has new radiating pain or weakness to let me know we will go from there.   Advised to contact the office if any questions or concerns arise.  Kristine Decamp, PA-C Department of neurosurgery
